# Patient Record
Sex: Female | Born: 1980 | Race: White | Hispanic: No | Marital: Married | Smoking: Former smoker
Health system: Southern US, Community
[De-identification: ages and names within clinical notes are randomized; demographics above are authoritative.]

## PROBLEM LIST (undated history)

## (undated) DIAGNOSIS — F449 Dissociative and conversion disorder, unspecified: Secondary | ICD-10-CM

## (undated) DIAGNOSIS — G43909 Migraine, unspecified, not intractable, without status migrainosus: Secondary | ICD-10-CM

## (undated) DIAGNOSIS — F319 Bipolar disorder, unspecified: Secondary | ICD-10-CM

## (undated) DIAGNOSIS — F32A Depression, unspecified: Secondary | ICD-10-CM

## (undated) DIAGNOSIS — E559 Vitamin D deficiency, unspecified: Secondary | ICD-10-CM

## (undated) DIAGNOSIS — N83209 Unspecified ovarian cyst, unspecified side: Secondary | ICD-10-CM

## (undated) DIAGNOSIS — D649 Anemia, unspecified: Secondary | ICD-10-CM

## (undated) DIAGNOSIS — Z87442 Personal history of urinary calculi: Secondary | ICD-10-CM

## (undated) DIAGNOSIS — G4730 Sleep apnea, unspecified: Secondary | ICD-10-CM

## (undated) DIAGNOSIS — A4902 Methicillin resistant Staphylococcus aureus infection, unspecified site: Secondary | ICD-10-CM

## (undated) DIAGNOSIS — I10 Essential (primary) hypertension: Secondary | ICD-10-CM

## (undated) DIAGNOSIS — T7840XA Allergy, unspecified, initial encounter: Secondary | ICD-10-CM

## (undated) DIAGNOSIS — G4700 Insomnia, unspecified: Secondary | ICD-10-CM

## (undated) DIAGNOSIS — E538 Deficiency of other specified B group vitamins: Secondary | ICD-10-CM

## (undated) DIAGNOSIS — J45909 Unspecified asthma, uncomplicated: Secondary | ICD-10-CM

## (undated) HISTORY — PX: NASAL SINUS SURGERY: SHX719

## (undated) HISTORY — DX: Sleep apnea, unspecified: G47.30

## (undated) HISTORY — DX: Allergy, unspecified, initial encounter: T78.40XA

## (undated) HISTORY — PX: TOTAL ABDOMINAL HYSTERECTOMY W/ BILATERAL SALPINGOOPHORECTOMY: SHX83

## (undated) HISTORY — DX: Vitamin D deficiency, unspecified: E55.9

## (undated) HISTORY — PX: ABDOMINAL HYSTERECTOMY: SHX81

## (undated) HISTORY — PX: BUNIONECTOMY: SHX129

## (undated) HISTORY — PX: HAMMER TOE SURGERY: SHX385

## (undated) HISTORY — DX: Bipolar disorder, unspecified: F31.9

## (undated) HISTORY — DX: Unspecified ovarian cyst, unspecified side: N83.209

## (undated) HISTORY — DX: Dissociative and conversion disorder, unspecified: F44.9

## (undated) HISTORY — DX: Migraine, unspecified, not intractable, without status migrainosus: G43.909

## (undated) HISTORY — PX: EYE SURGERY: SHX253

## (undated) HISTORY — DX: Unspecified asthma, uncomplicated: J45.909

## (undated) HISTORY — DX: Insomnia, unspecified: G47.00

## (undated) HISTORY — DX: Depression, unspecified: F32.A

## (undated) HISTORY — DX: Deficiency of other specified B group vitamins: E53.8

---

## 2005-09-24 HISTORY — PX: TUBAL LIGATION: SHX77

## 2008-09-24 DIAGNOSIS — A4902 Methicillin resistant Staphylococcus aureus infection, unspecified site: Secondary | ICD-10-CM

## 2008-09-24 HISTORY — DX: Methicillin resistant Staphylococcus aureus infection, unspecified site: A49.02

## 2012-07-24 ENCOUNTER — Ambulatory Visit: Payer: Self-pay | Admitting: Family Medicine

## 2012-08-29 ENCOUNTER — Ambulatory Visit: Payer: Self-pay | Admitting: Family Medicine

## 2012-09-24 HISTORY — PX: HAND SURGERY: SHX662

## 2012-12-02 ENCOUNTER — Ambulatory Visit: Payer: Self-pay | Admitting: Neurology

## 2013-07-16 ENCOUNTER — Ambulatory Visit: Payer: Self-pay | Admitting: Specialist

## 2013-07-17 LAB — PATHOLOGY REPORT

## 2013-07-27 DIAGNOSIS — F446 Conversion disorder with sensory symptom or deficit: Secondary | ICD-10-CM | POA: Insufficient documentation

## 2013-12-17 ENCOUNTER — Ambulatory Visit: Payer: Self-pay | Admitting: Family Medicine

## 2015-01-14 NOTE — Op Note (Signed)
PATIENT NAME:  Emily [name], Emily [name] MR#:  [id number] DATE OF BIRTH:  [birth year]  DATE OF PROCEDURE:  07/16/2013  PREOPERATIVE DIAGNOSIS: Dorsal left wrist ganglion.   POSTOPERATIVE DIAGNOSIS: Dorsal left wrist ganglion.   PROCEDURE: Excision of dorsal left wrist ganglion.   SURGEON: Myra Rudehristopher Vashon Riordan, M.D.   ANESTHESIA: General.   COMPLICATIONS: None.   TOURNIQUET TIME: Approximately 30 minutes.   DESCRIPTION OF PROCEDURE: After adequate induction of general anesthesia, the left upper extremity is thoroughly prepped with alcohol and ChloraPrep and draped in standard sterile fashion. The extremity is wrapped out with the Esmarch bandage and pneumatic tourniquet elevated to 250 mmHg. Under loupe magnification, standard transverse incision is made on the dorsum of the wrist over the prominence of the ganglion. The dissection is carried down to the extensor tendons and the tendons are reflected to each side. The small fascia over the ganglion is incised. The ganglion is seen to be 1.5 cm and extends down to the usual position at the wrist joint. The ganglion is completely dissected out along with a 1 cm in diameter area of wrist capsule. Careful search is made for any residual ganglion and none is seen. The wound is thoroughly irrigated multiple times. Skin edges are infiltrated with 0.5% plain Marcaine. Subcutaneous tissue is closed with 5-0 Vicryl. The skin is closed with a running subcuticular 3-0 Prolene. A soft bulky dressing with a volar splint is applied. The patient is returned to the recovery room in satisfactory condition having tolerated the procedure quite well. ____________________________ Clare Gandyhristopher E. Read Bonelli, MD ces:sb D: 07/16/2013 08:43:13 ET T: 07/16/2013 08:59:24 ET JOB#: [id number]  cc: Clare Gandyhristopher E. Cardell Rachel, MD, <Dictator> Clare GandyHRISTOPHER E Charliee Krenz MD ELECTRONICALLY SIGNED 07/17/2013 6:15

## 2015-04-14 DIAGNOSIS — F339 Major depressive disorder, recurrent, unspecified: Secondary | ICD-10-CM

## 2015-04-14 DIAGNOSIS — E559 Vitamin D deficiency, unspecified: Secondary | ICD-10-CM

## 2015-04-14 DIAGNOSIS — G4700 Insomnia, unspecified: Secondary | ICD-10-CM

## 2015-04-14 DIAGNOSIS — E538 Deficiency of other specified B group vitamins: Secondary | ICD-10-CM

## 2015-04-14 DIAGNOSIS — N920 Excessive and frequent menstruation with regular cycle: Secondary | ICD-10-CM | POA: Insufficient documentation

## 2015-04-14 DIAGNOSIS — G4730 Sleep apnea, unspecified: Secondary | ICD-10-CM

## 2015-04-14 DIAGNOSIS — G43909 Migraine, unspecified, not intractable, without status migrainosus: Secondary | ICD-10-CM

## 2015-04-14 DIAGNOSIS — F319 Bipolar disorder, unspecified: Secondary | ICD-10-CM

## 2015-04-14 DIAGNOSIS — G4733 Obstructive sleep apnea (adult) (pediatric): Secondary | ICD-10-CM | POA: Insufficient documentation

## 2015-04-14 DIAGNOSIS — F449 Dissociative and conversion disorder, unspecified: Secondary | ICD-10-CM | POA: Insufficient documentation

## 2015-04-14 DIAGNOSIS — N924 Excessive bleeding in the premenopausal period: Secondary | ICD-10-CM

## 2015-04-14 DIAGNOSIS — J45909 Unspecified asthma, uncomplicated: Secondary | ICD-10-CM

## 2015-04-15 ENCOUNTER — Encounter: Payer: Self-pay | Admitting: Unknown Physician Specialty

## 2015-04-15 ENCOUNTER — Ambulatory Visit (INDEPENDENT_AMBULATORY_CARE_PROVIDER_SITE_OTHER): Payer: Managed Care, Other (non HMO) | Admitting: Unknown Physician Specialty

## 2015-04-15 VITALS — BP 118/82 | HR 87 | Temp 98.5°F | Ht 67.5 in | Wt 251.6 lb

## 2015-04-15 DIAGNOSIS — N730 Acute parametritis and pelvic cellulitis: Secondary | ICD-10-CM

## 2015-04-15 DIAGNOSIS — R1032 Left lower quadrant pain: Secondary | ICD-10-CM

## 2015-04-15 MED ORDER — METRONIDAZOLE 500 MG PO TABS: 500.0000 mg | ORAL_TABLET | Freq: Three times a day (TID) | ORAL | Status: DC

## 2015-04-15 MED ORDER — CIPROFLOXACIN HCL 750 MG PO TABS: 750.0000 mg | ORAL_TABLET | Freq: Two times a day (BID) | ORAL | Status: DC

## 2015-04-15 MED ORDER — HYDROCODONE-ACETAMINOPHEN 5-325 MG PO TABS: 1.0000 | ORAL_TABLET | Freq: Four times a day (QID) | ORAL | Status: DC | PRN

## 2015-04-15 NOTE — Patient Instructions (Signed)
Pelvic Inflammatory Disease °Pelvic inflammatory disease (PID) refers to an infection in some or all of the female organs. The infection can be in the uterus, ovaries, fallopian tubes, or the surrounding tissues in the pelvis. PID can cause abdominal or pelvic pain that comes on suddenly (acute pelvic pain). PID is a serious infection because it can lead to lasting (chronic) pelvic pain or the inability to have children (infertile).  °CAUSES  °The infection is often caused by the normal bacteria found in the vaginal tissues. PID may also be caused by an infection that is spread during sexual contact. PID can also occur following:  °· The birth of a baby.   °· A miscarriage.   °· An abortion.   °· Major pelvic surgery.   °· The use of an intrauterine device (IUD).   °· A sexual assault.   °RISK FACTORS °Certain factors can put a person at higher risk for PID, such as: °· Being younger than 25 years. °· Being sexually active at a young age. °· Using nonbarrier contraception. °· Having multiple sexual partners. °· Having sex with someone who has symptoms of a genital infection. °· Using oral contraception. °Other times, certain behaviors can increase the possibility of getting PID, such as: °· Having sex during your period. °· Using a vaginal douche. °· Having an intrauterine device (IUD) in place. °SYMPTOMS  °· Abdominal or pelvic pain.   °· Fever.   °· Chills.   °· Abnormal vaginal discharge. °· Abnormal uterine bleeding.   °· Unusual pain shortly after finishing your period. °DIAGNOSIS  °Your caregiver will choose some of the following methods to make a diagnosis, such as:  °· Performing a physical exam and history. A pelvic exam typically reveals a very tender uterus and surrounding pelvis.   °· Ordering laboratory tests including a pregnancy test, blood tests, and urine test.  °· Ordering cultures of the vagina and cervix to check for a sexually transmitted infection (STI). °· Performing an ultrasound.    °· Performing a laparoscopic procedure to look inside the pelvis.   °TREATMENT  °· Antibiotic medicines may be prescribed and taken by mouth.   °· Sexual partners may be treated when the infection is caused by a sexually transmitted disease (STD).   °· Hospitalization may be needed to give antibiotics intravenously. °· Surgery may be needed, but this is rare. °It may take weeks until you are completely well. If you are diagnosed with PID, you should also be checked for human immunodeficiency virus (HIV).   °HOME CARE INSTRUCTIONS  °· If given, take your antibiotics as directed. Finish the medicine even if you start to feel better.   °· Only take over-the-counter or prescription medicines for pain, discomfort, or fever as directed by your caregiver.   °· Do not have sexual intercourse until treatment is completed or as directed by your caregiver. If PID is confirmed, your recent sexual partner(s) will need treatment.   °· Keep your follow-up appointments. °SEEK MEDICAL CARE IF:  °· You have increased or abnormal vaginal discharge.   °· You need prescription medicine for your pain.   °· You vomit.   °· You cannot take your medicines.   °· Your partner has an STD.   °SEEK IMMEDIATE MEDICAL CARE IF:  °· You have a fever.   °· You have increased abdominal or pelvic pain.   °· You have chills.   °· You have pain when you urinate.   °· You are not better after 72 hours following treatment.   °MAKE SURE YOU:  °· Understand these instructions. °· Will watch your condition. °· Will get help right away if you are not doing well or get worse. °  Document Released: 09/10/2005 Document Revised: 01/05/2013 Document Reviewed: 09/06/2011 °ExitCare® Patient Information ©2015 [employer]. This information is not intended to replace advice given to you by your health care provider. Make sure you discuss any questions you have with your health care provider. ° °

## 2015-04-15 NOTE — Progress Notes (Signed)
BP 118/82 mmHg  Pulse 87  Temp(Src) 98.5 F (36.9 C)  Ht 5' 7.5" (1.715 m)  Wt 251 lb 9.6 oz (114.125 kg)  BMI 38.80 kg/m2  SpO2 100%  LMP 03/26/2015 (Exact Date)   Subjective:    Patient ID: Emily [name], female    DOB: 06-Dec-1980, 34 y.o.   MRN: [id number]  HPI: Emily [name] is a 34 y.o. female  Chief Complaint  Patient presents with  . Abdominal Pain    pt states pain is in lower, left quadrant. Pain started Sunday night (04/10/15)   Abdominal Pain This is a new problem. Episode onset: 5 days. The onset quality is sudden. The problem occurs constantly. The problem has been unchanged. The pain is located in the LLQ. Pain scale: 5/10 at rest but much higher at times. Quality: stabbing. The abdominal pain does not radiate. Associated symptoms include nausea. Pertinent negatives include no constipation, diarrhea, dysuria, frequency or vomiting. The pain is aggravated by movement. The pain is relieved by nothing. Treatments tried: Ibuprofen. The treatment provided no relief.  LMP 7/02  Relevant past medical, surgical, family and social history reviewed and updated as indicated. Interim medical history since our last visit reviewed. Allergies and medications reviewed and updated.  Review of Systems  Gastrointestinal: Positive for nausea and abdominal pain. Negative for vomiting, diarrhea and constipation.  Genitourinary: Negative for dysuria and frequency.    Per HPI unless specifically indicated above     Objective:    BP 118/82 mmHg  Pulse 87  Temp(Src) 98.5 F (36.9 C)  Ht 5' 7.5" (1.715 m)  Wt 251 lb 9.6 oz (114.125 kg)  BMI 38.80 kg/m2  SpO2 100%  LMP 03/26/2015 (Exact Date)  Wt Readings from Last 3 Encounters:  04/15/15 251 lb 9.6 oz (114.125 kg)  07/26/14 234 lb (106.142 kg)    Physical Exam  Constitutional: She is oriented to person, place, and time. She appears well-developed and well-nourished. No distress.  HENT:  Head: Normocephalic and atraumatic.   Eyes: Conjunctivae and lids are normal. Right eye exhibits no discharge. Left eye exhibits no discharge. No scleral icterus.  Cardiovascular: Normal rate and regular rhythm.   Pulmonary/Chest: Effort normal. No respiratory distress.  Abdominal: Normal appearance. There is no splenomegaly or hepatomegaly. There is tenderness in the left lower quadrant. There is guarding. There is no CVA tenderness.  Genitourinary: Vagina normal and uterus normal. Cervix exhibits discharge and friability. Cervix exhibits no motion tenderness. Right adnexum displays tenderness. Left adnexum displays tenderness.  Musculoskeletal: Normal range of motion.  Neurological: She is alert and oriented to person, place, and time.  Skin: Skin is intact. No rash noted. No pallor.  Psychiatric: She has a normal mood and affect. Her behavior is normal. Judgment and thought content normal.   WBC is 12.2 Urine is negative.   Wet prep shows positive clue cells    Assessment & Plan:   Problem List Items Addressed This Visit    None    Visit Diagnoses    Left lower quadrant pain    -  Primary    Relevant Orders    UA/M w/rflx Culture, Routine    CBC With Differential/Platelet    Pregnancy, urine    WET PREP FOR TRICH, YEAST, CLUE    GC/Chlamydia Probe Amp    PID (acute pelvic inflammatory disease)        Will rx Cipro and Flagyl.  Will rx Hydrocodone for severe pain. Check GC and Chlamydia.  Pt ed on possible causes of infection.      Relevant Medications    metroNIDAZOLE (FLAGYL) 500 MG tablet        Follow up plan: Return if symptoms worsen or fail to improve.  To the ER worsening symptoms or unable to keep down food or fluids.

## 2015-04-16 LAB — WET PREP FOR TRICH, YEAST, CLUE
Clue Cell Exam: POSITIVE — AB
Trichomonas Exam: NEGATIVE
Yeast Exam: NEGATIVE

## 2015-04-17 LAB — GC/CHLAMYDIA PROBE AMP
Chlamydia trachomatis, NAA: NEGATIVE
Neisseria gonorrhoeae by PCR: NEGATIVE

## 2015-04-19 ENCOUNTER — Telehealth: Payer: Self-pay

## 2015-04-19 ENCOUNTER — Ambulatory Visit (INDEPENDENT_AMBULATORY_CARE_PROVIDER_SITE_OTHER): Payer: Managed Care, Other (non HMO) | Admitting: Family Medicine

## 2015-04-19 ENCOUNTER — Encounter: Payer: Self-pay | Admitting: Family Medicine

## 2015-04-19 VITALS — BP 111/76 | HR 79 | Temp 99.2°F | Wt 252.2 lb

## 2015-04-19 DIAGNOSIS — R1031 Right lower quadrant pain: Secondary | ICD-10-CM

## 2015-04-19 DIAGNOSIS — R1032 Left lower quadrant pain: Secondary | ICD-10-CM

## 2015-04-19 MED ORDER — ONDANSETRON HCL 4 MG PO TABS: 4.0000 mg | ORAL_TABLET | Freq: Three times a day (TID) | ORAL | Status: DC | PRN

## 2015-04-19 NOTE — Telephone Encounter (Signed)
Pt called is still experiencing severe pain, pt added to Dr. Ronnette Hila schedule for today @ 1pm. Thanks.

## 2015-04-19 NOTE — Patient Instructions (Signed)
CT Scan A computed tomography (CT) scan is a specialized X-ray scan. It uses X-rays and a computer to make pictures of different areas of your body. A CT scan can offer more detailed information than a regular X-ray exam. The CT scan provides data about internal organs, soft tissue structures, blood vessels, and bones.  The CT scanner is a large machine that takes pictures of your body as you move through the opening.  LET YOUR HEALTH CARE PROVIDER KNOW ABOUT:  Any allergies you have.   All medicines you are taking, including vitamins, herbs, eye drops, creams, and over-the-counter medicines.   Previous problems you or members of your family have had with the use of anesthetics.   Any blood disorders you have.   Previous surgeries you have had.   Medical conditions you have. RISKS AND COMPLICATIONS  Generally, this is a safe procedure. However, as with any procedure, problems can occur. Possible problems include:   An allergic reaction to the contrast material.   Development of cancer from excessive exposure to radiation. The risk of this is small.  BEFORE THE PROCEDURE   The day before the test, stop drinking caffeinated beverages. These include energy drinks, tea, soda, coffee, and hot chocolate.   On the day of the test:  About 4 hours before the test, stop eating and drinking anything but water as advised by your health care provider.   Avoid wearing jewelry. You will have to partly or fully undress and wear a hospital gown. PROCEDURE   You will be asked to lie on a table with your arms above your head.   If contrast dye is to be used for the test, an IV tube will be inserted in your arm. The contrast dye will be injected into the IV tube. You might feel warm, or you may get a metallic taste in your mouth.   The table you will be lying on will move into a large machine that will do the scanning.   You will be able to see, hear, and talk to the person running the  machine while you are in it. Follow that person's directions.   The CT machine will move around you to take pictures. Do not move while it is scanning. This helps to get a good image.   When the best possible pictures have been taken, the machine will be turned off. The table will be moved out of the machine. The IV tube will then be removed. AFTER THE PROCEDURE  Ask your health care provider when to follow up for your test results. Document Released: 10/18/2004 Document Revised: 09/15/2013 Document Reviewed: 05/18/2013 ExitCare Patient Information 2015 [employer]. This information is not intended to replace advice given to you by your health care provider. Make sure you discuss any questions you have with your health care provider.  

## 2015-04-19 NOTE — Progress Notes (Signed)
BP 111/76 mmHg  Pulse 79  Temp(Src) 99.2 F (37.3 C)  Wt 252 lb 3.2 oz (114.397 kg)  SpO2 99%  LMP 03/26/2015 (Exact Date)   Subjective:    Patient ID: Emily [name], female    DOB: [birth year], 34 y.o.   MRN: [id number]  HPI: Emily [name] is a 34 y.o. female  Chief Complaint  Patient presents with  . Abdominal Pain    patient was recently seen by CW, she gave her Flagyl and Cipro. Patients states that she is not better at all, and that the pain has traveled to the other side of her abdomen.   ABDOMINAL PAIN- she notes that her pain has gotten worse, now on the R and and the L side. Pain medicine makes her feel nauseous and dizzy, so didn't take it past Sunday. Not better- worse. Concerned so came back in. Negative pregnancy on 04/15/15. Has not had any sexual activity since prior to then.  Duration: 9 days Onset: sudden Severity: 7/10- constant on the L, 4/10 on the R Quality: sharp and stabbing Location:  LLQ and RLQ  Episode duration:  Radiation: no Frequency: constant Alleviating factors: nothing Aggravating factors: twisting, movement Status: worse Treatments attempted: antibiotics Fever: yes Nausea: yes Vomiting: no Weight loss: no Decreased appetite: no Diarrhea: yes Constipation: no Blood in stool: no Heartburn: no Jaundice: no Rash: no Dysuria/urinary frequency: no Hematuria: no History of sexually transmitted disease: no Recurrent NSAID use: no   Relevant past medical, surgical, family and social history reviewed and updated as indicated. Interim medical history since our last visit reviewed. Allergies and medications reviewed and updated.  Review of Systems  Constitutional: Negative.   Respiratory: Negative.   Cardiovascular: Negative.   Gastrointestinal: Positive for nausea, abdominal pain and diarrhea. Negative for vomiting, constipation, blood in stool, abdominal distention, anal bleeding and rectal pain.  Psychiatric/Behavioral: Negative.      Per HPI unless specifically indicated above     Objective:    BP 111/76 mmHg  Pulse 79  Temp(Src) 99.2 F (37.3 C)  Wt 252 lb 3.2 oz (114.397 kg)  SpO2 99%  LMP 03/26/2015 (Exact Date)  Wt Readings from Last 3 Encounters:  04/19/15 252 lb 3.2 oz (114.397 kg)  04/15/15 251 lb 9.6 oz (114.125 kg)  07/26/14 234 lb (106.142 kg)    Physical Exam  Constitutional: She is oriented to person, place, and time. She appears well-developed and well-nourished. No distress.  HENT:  Head: Normocephalic and atraumatic.  Right Ear: Hearing normal.  Left Ear: Hearing normal.  Nose: Nose normal.  Eyes: Conjunctivae and lids are normal. Right eye exhibits no discharge. Left eye exhibits no discharge. No scleral icterus.  Cardiovascular: Normal rate, regular rhythm, normal heart sounds and intact distal pulses.  Exam reveals no gallop and no friction rub.   No murmur heard. Pulmonary/Chest: Effort normal and breath sounds normal. No respiratory distress. She has no wheezes. She has no rales. She exhibits no tenderness.  Abdominal: Soft. Bowel sounds are normal. She exhibits no distension and no mass. There is no hepatosplenomegaly. There is tenderness in the right lower quadrant, left upper quadrant and left lower quadrant. There is guarding. There is no rigidity, no rebound, no CVA tenderness, no tenderness at McBurney's point and negative Murphy's sign.    Musculoskeletal: Normal range of motion.  Neurological: She is alert and oriented to person, place, and time.  Skin: Skin is warm, dry and intact. No rash noted. No erythema. No  pallor.  Psychiatric: She has a normal mood and affect. Her speech is normal and behavior is normal. Judgment and thought content normal. Cognition and memory are normal.  Nursing note and vitals reviewed.   Results for orders placed or performed in visit on 04/15/15  WET PREP FOR TRICH, YEAST, CLUE  Result Value Ref Range   Trichomonas Exam Negative Negative    Yeast Exam Negative Negative   Clue Cell Exam Positive (A) Negative  GC/Chlamydia Probe Amp  Result Value Ref Range   Chlamydia trachomatis, NAA Negative Negative   Neisseria gonorrhoeae by PCR Negative Negative   PLEASE NOTE: Comment   Microscopic Examination  Result Value Ref Range   WBC, UA 0-5 0 -  5 /hpf   RBC, UA 0-2 0 -  2 /hpf   Epithelial Cells (non renal) 0-10 0 - 10 /hpf   Bacteria, UA Few None seen/Few  UA/M w/rflx Culture, Routine  Result Value Ref Range   Urinalysis Reflex Comment   CBC With Differential/Platelet  Result Value Ref Range   WBC 12.8 (H) 3.4 - 10.8 x10E3/uL   RBC 4.56 3.77 - 5.28 x10E6/uL   Hemoglobin 13.2 11.1 - 15.9 g/dL   Hematocrit 16.1 09.6 - 46.6 %   MCV 86 79 - 97 fL   MCH 28.9 26.6 - 33.0 pg   MCHC 33.6 31.5 - 35.7 g/dL   RDW 04.5 40.9 - 81.1 %   Platelets 321 150 - 379 x10E3/uL   Neutrophils 61 %   Lymphs 29 %   MID 10 %   Neutrophils Absolute 7.8 (H) 1.4 - 7.0 x10E3/uL   Lymphocytes Absolute 3.7 (H) 0.7 - 3.1 x10E3/uL   MID (Absolute) 1.3 0.1 - 1.6 X10E3/uL  Pregnancy, urine  Result Value Ref Range   Preg Test, Ur Negative Negative  Urine Culture, Routine  Result Value Ref Range   Urine Culture, Routine Final report    Result 1 Comment       Assessment & Plan:   Problem List Items Addressed This Visit    None    Visit Diagnoses    Bilateral lower abdominal pain    -  Primary    Concerning for appendicitis, but negative WBC last visit. Will obtain CT of abdomen and pelvis with contrast. Continue abx. CMP checked today. Await results.     Relevant Orders    CT Abdomen Pelvis W Contrast    Comprehensive metabolic panel        Follow up plan: Return Pending results. Marland Kitchen

## 2015-04-20 ENCOUNTER — Encounter: Payer: Self-pay | Admitting: Family Medicine

## 2015-04-20 LAB — CBC WITH DIFFERENTIAL/PLATELET
HEMATOCRIT: 39.3 % (ref 34.0–46.6)
HEMOGLOBIN: 13.2 g/dL (ref 11.1–15.9)
Lymphocytes Absolute: 3.7 10*3/uL — ABNORMAL HIGH (ref 0.7–3.1)
Lymphs: 29 %
MCH: 28.9 pg (ref 26.6–33.0)
MCHC: 33.6 g/dL (ref 31.5–35.7)
MCV: 86 fL (ref 79–97)
MID (Absolute): 1.3 10*3/uL (ref 0.1–1.6)
MID: 10 %
Neutrophils Absolute: 7.8 10*3/uL — ABNORMAL HIGH (ref 1.4–7.0)
Neutrophils: 61 %
Platelets: 321 10*3/uL (ref 150–379)
RBC: 4.56 x10E6/uL (ref 3.77–5.28)
RDW: 13.4 % (ref 12.3–15.4)
WBC: 12.8 10*3/uL — AB (ref 3.4–10.8)

## 2015-04-20 LAB — UA/M W/RFLX CULTURE, ROUTINE
BILIRUBIN UA: NEGATIVE
Glucose, UA: NEGATIVE
KETONES UA: NEGATIVE
NITRITE UA: NEGATIVE
PROTEIN UA: NEGATIVE
RBC UA: NEGATIVE
SPEC GRAV UA: 1.015 (ref 1.005–1.030)
Urobilinogen, Ur: 0.2 mg/dL (ref 0.2–1.0)
pH, UA: 6 (ref 5.0–7.5)

## 2015-04-20 LAB — PREGNANCY, URINE: Preg Test, Ur: NEGATIVE

## 2015-04-20 LAB — COMPREHENSIVE METABOLIC PANEL
ALT: 15 IU/L (ref 0–32)
AST: 14 IU/L (ref 0–40)
Albumin/Globulin Ratio: 1.5 (ref 1.1–2.5)
Albumin: 4.2 g/dL (ref 3.5–5.5)
Alkaline Phosphatase: 82 IU/L (ref 39–117)
BUN / CREAT RATIO: 9 (ref 8–20)
BUN: 9 mg/dL (ref 6–20)
Bilirubin Total: 0.5 mg/dL (ref 0.0–1.2)
CHLORIDE: 102 mmol/L (ref 97–108)
CO2: 24 mmol/L (ref 18–29)
Calcium: 9.1 mg/dL (ref 8.7–10.2)
Creatinine, Ser: 0.97 mg/dL (ref 0.57–1.00)
GFR calc non Af Amer: 76 mL/min/{1.73_m2} (ref >59)
GFR, EST AFRICAN AMERICAN: 88 mL/min/{1.73_m2} (ref >59)
GLUCOSE: 101 mg/dL — AB (ref 65–99)
Globulin, Total: 2.8 g/dL (ref 1.5–4.5)
Potassium: 4.6 mmol/L (ref 3.5–5.2)
Sodium: 140 mmol/L (ref 134–144)
Total Protein: 7 g/dL (ref 6.0–8.5)

## 2015-04-20 LAB — MICROSCOPIC EXAMINATION

## 2015-04-20 LAB — URINE CULTURE, REFLEX

## 2015-04-21 ENCOUNTER — Ambulatory Visit
Admission: RE | Admit: 2015-04-21 | Discharge: 2015-04-21 | Disposition: A | Discharge status: Home / Self Care | Payer: Managed Care, Other (non HMO) | Source: Ambulatory Visit | Attending: Family Medicine | Admitting: Family Medicine

## 2015-04-21 ENCOUNTER — Telehealth: Payer: Self-pay | Admitting: Family Medicine

## 2015-04-21 DIAGNOSIS — R1032 Left lower quadrant pain: Secondary | ICD-10-CM | POA: Diagnosis present

## 2015-04-21 DIAGNOSIS — N83202 Unspecified ovarian cyst, left side: Secondary | ICD-10-CM

## 2015-04-21 DIAGNOSIS — R1031 Right lower quadrant pain: Secondary | ICD-10-CM

## 2015-04-21 DIAGNOSIS — N830 Follicular cyst of ovary: Secondary | ICD-10-CM | POA: Diagnosis not present

## 2015-04-21 MED ORDER — IOHEXOL 350 MG/ML SOLN
100.0000 mL | Freq: Once | INTRAVENOUS | Status: AC | PRN
  Administered 2015-04-21: 100 mL via INTRAVENOUS

## 2015-04-21 MED ORDER — IOHEXOL 300 MG/ML  SOLN: 100.0000 mL | Freq: Once | INTRAMUSCULAR | Status: DC | PRN

## 2015-04-21 MED ORDER — TRAMADOL HCL 50 MG PO TABS: 50.0000 mg | ORAL_TABLET | Freq: Three times a day (TID) | ORAL | Status: DC | PRN

## 2015-04-21 NOTE — Telephone Encounter (Signed)
CT of the abdomen showed L sided ovarian cyst. Patient cannot take her vicodin as it makes her feel strange. Will start her on tramadol for pain. OK to stop antibiotics as she does not appear to have a reason to be taking them. She requests to see GYN. Referral to GYN made today.

## 2015-04-22 ENCOUNTER — Other Ambulatory Visit: Payer: Self-pay | Admitting: Family Medicine

## 2015-04-22 MED ORDER — TRAMADOL HCL 50 MG PO TABS: 50.0000 mg | ORAL_TABLET | Freq: Three times a day (TID) | ORAL | Status: DC | PRN

## 2015-05-03 ENCOUNTER — Encounter: Payer: Self-pay | Admitting: Obstetrics and Gynecology

## 2015-05-03 ENCOUNTER — Ambulatory Visit (INDEPENDENT_AMBULATORY_CARE_PROVIDER_SITE_OTHER): Payer: Managed Care, Other (non HMO) | Admitting: Obstetrics and Gynecology

## 2015-05-03 VITALS — BP 123/79 | HR 102 | Ht 69.0 in | Wt 250.6 lb

## 2015-05-03 DIAGNOSIS — R102 Pelvic and perineal pain: Secondary | ICD-10-CM

## 2015-05-03 DIAGNOSIS — N8320 Unspecified ovarian cysts: Secondary | ICD-10-CM

## 2015-05-03 DIAGNOSIS — N946 Dysmenorrhea, unspecified: Secondary | ICD-10-CM

## 2015-05-03 DIAGNOSIS — Z9851 Tubal ligation status: Secondary | ICD-10-CM

## 2015-05-03 DIAGNOSIS — N921 Excessive and frequent menstruation with irregular cycle: Secondary | ICD-10-CM

## 2015-05-03 DIAGNOSIS — N83202 Unspecified ovarian cyst, left side: Secondary | ICD-10-CM

## 2015-05-03 MED ORDER — HYDROCODONE-ACETAMINOPHEN 5-325 MG PO TABS: 1.0000 | ORAL_TABLET | Freq: Four times a day (QID) | ORAL | Status: DC | PRN

## 2015-05-03 MED ORDER — ONDANSETRON 4 MG PO TBDP: 4.0000 mg | ORAL_TABLET | Freq: Four times a day (QID) | ORAL | Status: DC | PRN

## 2015-05-03 NOTE — Progress Notes (Signed)
Patient ID: Emily [name], female   DOB: [birth year], 34 y.o.   MRN: [id number]   Refer from [facility] family Pelvic pain worseing  x 1 month very painful during period x 6 months  ovarian cyst left- 2.5cm  see ct abd and pelvis- 04/21/2015

## 2015-05-03 NOTE — Progress Notes (Deleted)
GYNECOLOGY PROGRESS NOTE  Subjective:    Patient ID: Emily [name], female    DOB: [birth year], 34 y.o.   MRN: [id number]  HPI  Patient is a 34 y.o. [id number] female who presents for   {Common ambulatory SmartLinks:19316}  Review of Systems {ros; complete:30496}   Objective:   Blood pressure 123/79, pulse 102, height  (1.753 m), weight 250 lb 9.6 oz (113.671 kg), last menstrual period 04/23/2015. General appearance: {general exam:16600} Abdomen: {abdominal exam:16834} Pelvic: {pelvic exam:16852::"cervix normal in appearance","external genitalia normal","no adnexal masses or tenderness","no cervical motion tenderness","rectovaginal septum normal","uterus normal size, shape, and consistency","vagina normal without discharge"} Extremities: {extremity exam:5109} Neurologic: {neuro exam:17854}   Assessment:    Plan:

## 2015-05-04 MED ORDER — NORETHIN ACE-ETH ESTRAD-FE 1-20 MG-MCG(24) PO CHEW: 1.0000 | CHEWABLE_TABLET | Freq: Every day | ORAL | Status: DC

## 2015-05-04 NOTE — H&P (Signed)
Subjective:    Patient is a 34 y.o. G8P3003 female scheduled for laparoscopic BTL and Mirena IUD placement. Indications for procedure are dislodged left sterilization clip, and heavy menstrual bleeding with worsening dysmenorrhea.  Patient with h/o irregular cycles since onset of menses, however notes that menses has now become heavier and associated with painful cramps over the past 6 months.  Not relieved by Ibuprofen.  Has used Tramadol for cramps but notes undesirable side effects (moderate nausea, drowsiness).  Was seen by PCP recently due to persistent left sided abdominal pain outside of menses. Was told that she had an ovarian cyst and was referred to GYN.    Pertinent Gynecological History: Menses: irregular occurring approximately every 21-24 days with spotting approximately 2 days per month, followed by 7-10 days of heavy bleeding. Bleeding: dysfunctional uterine bleeding Contraception: tubal ligation Last mammogram: not yet indicated. Last pap: normal Date: 2014  Discussed Blood/Blood Products: no   Menstrual History: OB History    Gravida Para Term Preterm AB TAB SAB Ectopic Multiple Living   3 3 3       3       Menarche age: 58  Patient's last menstrual period was 04/23/2015.     OB History  Gravida Para Term Preterm AB SAB TAB Ectopic Multiple Living  3 3 3       3     # Outcome Date GA Lbr Len/2nd Weight Sex Delivery Anes PTL Lv  3 Term 2007   8 lb 1.6 oz (3.674 kg) M Vag-Spont   Y  2 Term 2004   8 lb 4.8 oz (3.765 kg) F Vag-Spont   Y  1 Term 2002   8 lb 1.8 oz (3.679 kg) F Vag-Spont   Y      Past Medical History  Diagnosis Date  . Conversion disorder   . Insomnia   . Asthma   . Bipolar 1 disorder   . Vitamin B 12 deficiency   . Vitamin D deficiency   . Depression   . Migraines   . Sleep apnea   . Ovarian cyst     left 2.5cm    Past Surgical History  Procedure Laterality Date  . Bunionectomy    . Hammer toe surgery    . Nasal sinus surgery    .  Hand surgery  2014    cyst removed  . Tubal ligation  2007    Social History   Social History  . Marital Status: Married    Spouse Name: N/A  . Number of Children: N/A  . Years of Education: N/A   Social History Main Topics  . Smoking status: Former Smoker    Quit date: 09/25/2003  . Smokeless tobacco: Never Used  . Alcohol Use: 0.0 oz/week    0 Standard drinks or equivalent per week     Comment: pt states she is a social drinker  . Drug Use: No  . Sexual Activity: Yes    Birth Control/ Protection: Surgical   Other Topics Concern  . None   Social History Narrative    Family History  Problem Relation Age of Onset  . Bipolar disorder Mother   . Anxiety disorder Mother   . Cancer Mother     brain  . Glaucoma Mother   . Heart disease Father   . Diabetes Father   . Cancer Father   . Bipolar disorder Sister   . Schizophrenia Sister   . Cancer Maternal Grandmother     lung  .  Heart disease Maternal Grandfather   . Breast cancer Brother   . Ovarian cancer Neg Hx     Outpatient Encounter Prescriptions as of 05/03/2015  Medication Sig  . amitriptyline (ELAVIL) 10 MG tablet Take 10 mg by mouth at bedtime.  Marland Kitchen buPROPion (WELLBUTRIN XL) 300 MG 24 hr tablet Take 300 mg by mouth daily.  . fluticasone (FLONASE) 50 MCG/ACT nasal spray Place 2 sprays into both nostrils daily as needed for allergies or rhinitis.  . Multiple Vitamin (MULTIVITAMIN) tablet Take 1 tablet by mouth daily.  . ondansetron (ZOFRAN) 4 MG tablet Take 1 tablet (4 mg total) by mouth every 8 (eight) hours as needed for nausea or vomiting.  . traMADol (ULTRAM) 50 MG tablet Take 1 tablet (50 mg total) by mouth every 8 (eight) hours as needed.     Allergies  Allergen Reactions  . Codeine Diarrhea and Nausea And Vomiting    Review of Systems Constitutional: No recent fever/chills/sweats Respiratory: No recent cough/bronchitis Cardiovascular: No chest pain Gastrointestinal: No recent  nausea/vomiting/diarrhea Genitourinary: No UTI symptoms Hematologic/lymphatic:No history of coagulopathy or recent blood thinner use    Objective:    BP 123/79 mmHg  Pulse 102  Ht  (1.753 m)  Wt 250 lb 9.6 oz (113.671 kg)  BMI 36.99 kg/m2  LMP 04/23/2015  General:   Normal  Skin:   normal  HEENT:  Normal  Neck:  Supple without Adenopathy or Thyromegaly  Lungs:   Heart:              Breasts:   Abdomen:  Pelvis:  M/S   Extremeties:  Neuro:    clear to auscultation bilaterally   Normal without murmur   Not Examined   soft, non-tender; bowel sounds normal; no masses,  no organomegaly   Exam deferred to OR  No CVAT  Warm/Dry   Normal        Labs:  Lab Results  Component Value Date   WBC 12.8* 04/15/2015   HCT 39.3 04/15/2015    04/21/2015:  IMPRESSION: No acute findings in the abdomen/pelvis. Predominately simple appearing 2.5 cm left ovarian cystic process likely a follicular cyst. Possible tiny midline periumbilical hernia containing only peritoneal fat.  The left-sided surgical clip from patient's bilateral tubal ligation lies over the cul-de-sac 5-6 cm from the expected region of the left fallopian tube.  Sub cm right renal cortical hypodensity likely a cyst but too small to characterize.  Assessment:    Dysmenorrhea Menorrhagia Pelvic pain Left ovarian cyst, 2.5 cm.  Dislodged left sterilization clip   Plan:   1. Discussed management options for abnormal uterine bleeding including tranexamic acid (Lysteda), oral progesterone, Depo Provera, Mirena IUD, endometrial ablation (Novasure Ablation) or hysterectomy as definitive surgical management.  Discussed risks and benefits of each method.   Patient desires Mirena IUD.  Printed patient education handouts were given to the patient to review at home.  2. Prescribed Vicodin for pelvic pain as she notes Ibuprofen does not work and has unwanted side effects with Tramadol as well as it being less  efficient.  Will also prescribe Zofran as patient has been known to have nausea with stronger pain meds.  3. Ovarian cyst - advised that cyst will likely resolve without intervention, however hormones from Mirena IUD will help to resolve cyst.   4. Dislodged sterilization clip - patient very concerned as she does not desire future sterility.  Advised on use of Mirena IUD for both contraception and management of bleeding, but  patient notes that she is very fertile and has been pregnant on several different types of birth control with "perfect use".  Desires repeat tubal ligation with cautery.   Counseling: Procedure, risks, reasons, benefits and complications (including injury to bowel, bladder, major blood vessel, ureter, bleeding, possibility of transfusion, infection, or fistula formation) reviewed in detail. Patient still desires procedure.  Consent signed. Preop testing ordered. Instructions reviewed, including NPO after midnight day prior to surgery.  Can place Mirena IUD at time of Laparoscopic BTL.    Hildred Laser, MD [facility]

## 2015-05-04 NOTE — Progress Notes (Addendum)
Subjective:    Patient is a 34 y.o. G51P3003 female who presents as a referral from [facility] for ovarian cyst.  Patient reports a h/o irregular cycles since onset of menses, however notes that menses has now become heavier and associated with painful cramps over the past 6 months.  Not relieved by Ibuprofen.  Has used Tramadol for cramps but notes undesirable side effects (moderate nausea, drowsiness).  Was seen by PCP recently due to persistent left sided abdominal pain outside of menses. Was told that she had an ovarian cyst and was referred to GYN.    Pertinent Gynecological History: Menses: irregular occurring approximately every 21-24 days with spotting approximately 2 days per month, followed by 7-10 days of heavy bleeding. Bleeding: dysfunctional uterine bleeding Contraception: tubal ligation Last mammogram: not yet indicated. Last pap: normal Date: 2014   Menstrual History: Menarche age: 35  Patient's last menstrual period was 04/23/2015.     OB History  Gravida Para Term Preterm AB SAB TAB Ectopic Multiple Living  3 3 3       3     # Outcome Date GA Lbr Len/2nd Weight Sex Delivery Anes PTL Lv  3 Term 2007   8 lb 1.6 oz (3.674 kg) M Vag-Spont   Y  2 Term 2004   8 lb 4.8 oz (3.765 kg) F Vag-Spont   Y  1 Term 2002   8 lb 1.8 oz (3.679 kg) F Vag-Spont   Y      Past Medical History  Diagnosis Date  . Conversion disorder   . Insomnia   . Asthma   . Bipolar 1 disorder   . Vitamin B 12 deficiency   . Vitamin D deficiency   . Depression   . Migraines   . Sleep apnea   . Ovarian cyst     left 2.5cm    Past Surgical History  Procedure Laterality Date  . Bunionectomy    . Hammer toe surgery    . Nasal sinus surgery    . Hand surgery  2014    cyst removed  . Tubal ligation  2007    Social History   Social History  . Marital Status: Married    Spouse Name: N/A  . Number of Children: N/A  . Years of Education: N/A   Social History Main Topics  .  Smoking status: Former Smoker    Quit date: 09/25/2003  . Smokeless tobacco: Never Used  . Alcohol Use: 0.0 oz/week    0 Standard drinks or equivalent per week     Comment: pt states she is a social drinker  . Drug Use: No  . Sexual Activity: Yes    Birth Control/ Protection: Surgical   Other Topics Concern  . None   Social History Narrative    Family History  Problem Relation Age of Onset  . Bipolar disorder Mother   . Anxiety disorder Mother   . Cancer Mother     brain  . Glaucoma Mother   . Heart disease Father   . Diabetes Father   . Cancer Father   . Bipolar disorder Sister   . Schizophrenia Sister   . Cancer Maternal Grandmother     lung  . Heart disease Maternal Grandfather   . Breast cancer Brother   . Ovarian cancer Neg Hx     Outpatient Encounter Prescriptions as of 05/03/2015  Medication Sig  . amitriptyline (ELAVIL) 10 MG tablet Take 10 mg by mouth at bedtime.  Marland Kitchen  buPROPion (WELLBUTRIN XL) 300 MG 24 hr tablet Take 300 mg by mouth daily.  . fluticasone (FLONASE) 50 MCG/ACT nasal spray Place 2 sprays into both nostrils daily as needed for allergies or rhinitis.  . Multiple Vitamin (MULTIVITAMIN) tablet Take 1 tablet by mouth daily.  . ondansetron (ZOFRAN) 4 MG tablet Take 1 tablet (4 mg total) by mouth every 8 (eight) hours as needed for nausea or vomiting.  . traMADol (ULTRAM) 50 MG tablet Take 1 tablet (50 mg total) by mouth every 8 (eight) hours as needed.     Allergies  Allergen Reactions  . Codeine Diarrhea and Nausea And Vomiting    Review of Systems Constitutional: No recent fever/chills/sweats Respiratory: No recent cough/bronchitis Cardiovascular: No chest pain Gastrointestinal: No recent nausea/vomiting/diarrhea Genitourinary: No UTI symptoms Hematologic/lymphatic:No history of coagulopathy or recent blood thinner use    Objective:   Blood pressure 123/79, pulse 102, height 5\' 9"  (1.753 m), weight 250 lb 9.6 oz (113.671 kg), last menstrual  period 04/23/2015. Body mass index is 36.99 kg/(m^2).  General appearance: alert and no distress, obese Abdomen: normal findings: no masses palpable and soft and abnormal findings:  mild tenderness in the lower abdomen (left>right). Pelvic: deferred at patient's request Extremities: extremities normal, atraumatic, no cyanosis or edema Neurologic: Grossly normal   Labs:  Lab Results  Component Value Date   WBC 12.8* 04/15/2015   HCT 39.3 04/15/2015    CT scan Abdomen/Pelvis 04/21/2015:  IMPRESSION: No acute findings in the abdomen/pelvis. Predominately simple appearing 2.5 cm left ovarian cystic process likely a follicular cyst. Possible tiny midline periumbilical hernia containing only peritoneal fat.  The left-sided surgical clip from patient's bilateral tubal ligation lies over the cul-de-sac 5-6 cm from the expected region of the left fallopian tube.  Sub cm right renal cortical hypodensity likely a cyst but too small to characterize.  Assessment:    Dysmenorrhea Menorrhagia Pelvic pain Left ovarian cyst, 2.5 cm.  Dislodged left sterilization clip   Plan:   1. Discussed management options for abnormal uterine bleeding including tranexamic acid (Lysteda), oral progesterone, Depo Provera, Mirena IUD, endometrial ablation (Novasure Ablation) or hysterectomy as definitive surgical management.  Discussed risks and benefits of each method.   Patient desires Mirena IUD.  Printed patient education handouts were given to the patient to review at home.  Will need to order pelvic ultrasound.   Will also give sample of OCP (Minastrin until time of surgery to help with bleeding/pain/cyst).  Patient has not been sexually active in the past month.  2. Prescribed Vicodin for pelvic pain as she notes Ibuprofen does not work and has unwanted side effects with Tramadol as well as it being less efficient.  Will also prescribe Zofran as patient has been known to have nausea with stronger pain  meds.  3. Ovarian cyst - advised that cyst will likely resolve without intervention, however hormones from Mirena IUD will help to resolve cyst.   4. Dislodged sterilization clip - patient very concerned as she does not desire future sterility.  Advised on use of Mirena IUD for both contraception and management of bleeding, but patient notes that she is very fertile and has been pregnant on several different types of birth control with "perfect use".  Desires repeat tubal ligation with cautery.   Counseling: Procedure, risks, reasons, benefits and complications (including injury to bowel, bladder, major blood vessel, ureter, bleeding, possibility of transfusion, infection, or fistula formation) reviewed in detail. Patient still desires procedure.  Consent signed. Preop testing  ordered. Instructions reviewed, including NPO after midnight day prior to surgery.  Can place Mirena IUD at time of Laparoscopic BTL.    Hildred Laser, MD [facility]

## 2015-05-05 NOTE — Addendum Note (Signed)
Addended by: Fabian November on: 05/05/2015 04:13 PM   Modules accepted: Orders

## 2015-05-06 ENCOUNTER — Ambulatory Visit
Admission: RE | Admit: 2015-05-06 | Discharge: 2015-05-06 | Disposition: A | Discharge status: Home / Self Care | Payer: Managed Care, Other (non HMO) | Source: Ambulatory Visit | Attending: Obstetrics and Gynecology | Admitting: Obstetrics and Gynecology

## 2015-05-06 ENCOUNTER — Encounter: Payer: Self-pay | Admitting: *Deleted

## 2015-05-06 ENCOUNTER — Other Ambulatory Visit: Payer: Managed Care, Other (non HMO)

## 2015-05-06 DIAGNOSIS — R102 Pelvic and perineal pain unspecified side: Secondary | ICD-10-CM

## 2015-05-06 DIAGNOSIS — N921 Excessive and frequent menstruation with irregular cycle: Secondary | ICD-10-CM | POA: Diagnosis present

## 2015-05-06 NOTE — Patient Instructions (Signed)
  Your procedure is scheduled on: 05-09-15 Report to [facility] SAME DAY SURGERY 2ND FLOOR @ 8:45 PER PT   Remember: Instructions that are not followed completely may result in serious medical risk, up to and including death, or upon the discretion of your surgeon and anesthesiologist your surgery may need to be rescheduled.    _X___ 1. Do not eat food or drink liquids after midnight. No gum chewing or hard candies.     _X___ 2. No Alcohol for 24 hours before or after surgery.   ____ 3. Bring all medications with you on the day of surgery if instructed.    ____ 4. Notify your doctor if there is any change in your medical condition     (cold, fever, infections).     Do not wear jewelry, make-up, hairpins, clips or nail polish.  Do not wear lotions, powders, or perfumes. You may wear deodorant.  Do not shave 48 hours prior to surgery. Men may shave face and neck.  Do not bring valuables to the hospital.    [facility] is not responsible for any belongings or valuables.               Contacts, dentures or bridgework may not be worn into surgery.  Leave your suitcase in the car. After surgery it may be brought to your room.  For patients admitted to the hospital, discharge time is determined by your  treatment team.   Patients discharged the day of surgery will not be allowed to drive home.   Please read over the following fact sheets that you were given:    ____ Take these medicines the morning of surgery with A SIP OF WATER:    1. NONE-PT TOLD TO TAKE WELLBUTRIN BUT SHE CANT TAKE IT WITH A SMALL SIP OF WATER  2.   3.   4.  5.  6.  ____ Fleet Enema (as directed)   ____ Use CHG Soap as directed  ____ Use inhalers on the day of surgery  ____ Stop metformin 2 days prior to surgery    ____ Take 1/2 of usual insulin dose the night before surgery and none on the morning of surgery.   ____ Stop Coumadin/Plavix/aspirin-N/A  ____ Stop Anti-inflammatories-NO NSAIDS OR ASA  PRODUCTS-HYDROCODONE OK TO TAKE   ____ Stop supplements until after surgery.    ____ Bring C-Pap to the hospital.

## 2015-05-06 NOTE — Pre-Procedure Instructions (Signed)
CALLED DR CHERRYS OFFICE AND SPOKE WITH ROBIN ABOUT NOT HAVING ANY ORDERS IN EPIC-H&P IN EPIC-ROBIN TO NOTIFY DR CHERRY-CHART SENT ON OVER TO SDS FOR MONDAY

## 2015-05-09 ENCOUNTER — Encounter: Payer: Self-pay | Admitting: *Deleted

## 2015-05-09 ENCOUNTER — Encounter
Admission: RE | Disposition: A | Discharge status: Home / Self Care | Payer: Self-pay | Source: Ambulatory Visit | Attending: Obstetrics and Gynecology

## 2015-05-09 ENCOUNTER — Ambulatory Visit: Payer: Managed Care, Other (non HMO) | Admitting: Registered Nurse

## 2015-05-09 ENCOUNTER — Ambulatory Visit
Admission: RE | Admit: 2015-05-09 | Discharge: 2015-05-09 | Disposition: A | Discharge status: Home / Self Care | Payer: Managed Care, Other (non HMO) | Source: Ambulatory Visit | Attending: Obstetrics and Gynecology | Admitting: Obstetrics and Gynecology

## 2015-05-09 DIAGNOSIS — E669 Obesity, unspecified: Secondary | ICD-10-CM | POA: Diagnosis not present

## 2015-05-09 DIAGNOSIS — N920 Excessive and frequent menstruation with regular cycle: Secondary | ICD-10-CM

## 2015-05-09 DIAGNOSIS — J45909 Unspecified asthma, uncomplicated: Secondary | ICD-10-CM | POA: Diagnosis not present

## 2015-05-09 DIAGNOSIS — Z87891 Personal history of nicotine dependence: Secondary | ICD-10-CM | POA: Diagnosis not present

## 2015-05-09 DIAGNOSIS — T85628A Displacement of other specified internal prosthetic devices, implants and grafts, initial encounter: Secondary | ICD-10-CM | POA: Diagnosis not present

## 2015-05-09 DIAGNOSIS — Z9851 Tubal ligation status: Secondary | ICD-10-CM | POA: Diagnosis not present

## 2015-05-09 DIAGNOSIS — G4733 Obstructive sleep apnea (adult) (pediatric): Secondary | ICD-10-CM | POA: Insufficient documentation

## 2015-05-09 DIAGNOSIS — F519 Sleep disorder not due to a substance or known physiological condition, unspecified: Secondary | ICD-10-CM | POA: Insufficient documentation

## 2015-05-09 DIAGNOSIS — Y768 Miscellaneous obstetric and gynecological devices associated with adverse incidents, not elsewhere classified: Secondary | ICD-10-CM | POA: Insufficient documentation

## 2015-05-09 DIAGNOSIS — N946 Dysmenorrhea, unspecified: Secondary | ICD-10-CM | POA: Insufficient documentation

## 2015-05-09 DIAGNOSIS — Z6836 Body mass index (BMI) 36.0-36.9, adult: Secondary | ICD-10-CM | POA: Diagnosis not present

## 2015-05-09 HISTORY — DX: Methicillin resistant Staphylococcus aureus infection, unspecified site: A49.02

## 2015-05-09 HISTORY — PX: LAPAROSCOPIC TUBAL LIGATION: SHX1937

## 2015-05-09 HISTORY — DX: Anemia, unspecified: D64.9

## 2015-05-09 HISTORY — PX: INTRAUTERINE DEVICE (IUD) INSERTION: SHX5877

## 2015-05-09 LAB — TSH: TSH: 1.929 u[IU]/mL (ref 0.350–4.500)

## 2015-05-09 LAB — POCT PREGNANCY, URINE: Preg Test, Ur: NEGATIVE

## 2015-05-09 SURGERY — LIGATION, FALLOPIAN TUBE, LAPAROSCOPIC
Anesthesia: General

## 2015-05-09 MED ORDER — HYDROCODONE-ACETAMINOPHEN 5-325 MG PO TABS
ORAL_TABLET | ORAL | Status: AC
  Administered 2015-05-09: 1 via ORAL
  Filled 2015-05-09: qty 1

## 2015-05-09 MED ORDER — BUPIVACAINE HCL (PF) 0.5 % IJ SOLN
INTRAMUSCULAR | Status: DC | PRN
  Administered 2015-05-09: 10 mL

## 2015-05-09 MED ORDER — FAMOTIDINE 20 MG PO TABS
ORAL_TABLET | ORAL | Status: AC
  Administered 2015-05-09: 20 mg via ORAL
  Filled 2015-05-09: qty 1

## 2015-05-09 MED ORDER — ROCURONIUM BROMIDE 100 MG/10ML IV SOLN
INTRAVENOUS | Status: DC | PRN
  Administered 2015-05-09: 50 mg via INTRAVENOUS

## 2015-05-09 MED ORDER — FAMOTIDINE 20 MG PO TABS
20.0000 mg | ORAL_TABLET | Freq: Once | ORAL | Status: AC
  Administered 2015-05-09: 20 mg via ORAL

## 2015-05-09 MED ORDER — HYDROCODONE-ACETAMINOPHEN 5-325 MG PO TABS
1.0000 | ORAL_TABLET | Freq: Four times a day (QID) | ORAL | Status: DC | PRN
Start: 2015-05-09 — End: 2015-05-09
  Administered 2015-05-09: 1 via ORAL

## 2015-05-09 MED ORDER — LACTATED RINGERS IV SOLN
INTRAVENOUS | Status: DC
  Administered 2015-05-09 (×2): via INTRAVENOUS

## 2015-05-09 MED ORDER — FENTANYL CITRATE (PF) 100 MCG/2ML IJ SOLN
25.0000 ug | INTRAMUSCULAR | Status: DC | PRN
  Administered 2015-05-09 (×5): 25 ug via INTRAVENOUS

## 2015-05-09 MED ORDER — ONDANSETRON HCL 4 MG/2ML IJ SOLN
INTRAMUSCULAR | Status: AC
  Filled 2015-05-09: qty 2

## 2015-05-09 MED ORDER — ONDANSETRON HCL 4 MG/2ML IJ SOLN
INTRAMUSCULAR | Status: DC | PRN
  Administered 2015-05-09: 4 mg via INTRAVENOUS

## 2015-05-09 MED ORDER — MIDAZOLAM HCL 2 MG/2ML IJ SOLN
INTRAMUSCULAR | Status: DC | PRN
  Administered 2015-05-09: 2 mg via INTRAVENOUS

## 2015-05-09 MED ORDER — HYDROCODONE-ACETAMINOPHEN 5-325 MG PO TABS: 1.0000 | ORAL_TABLET | Freq: Four times a day (QID) | ORAL | Status: DC | PRN

## 2015-05-09 MED ORDER — FENTANYL CITRATE (PF) 100 MCG/2ML IJ SOLN
INTRAMUSCULAR | Status: DC | PRN
  Administered 2015-05-09: 100 ug via INTRAVENOUS

## 2015-05-09 MED ORDER — PROPOFOL 10 MG/ML IV BOLUS
INTRAVENOUS | Status: DC | PRN
  Administered 2015-05-09: 150 mg via INTRAVENOUS

## 2015-05-09 MED ORDER — LIDOCAINE HCL (CARDIAC) 20 MG/ML IV SOLN
INTRAVENOUS | Status: DC | PRN
  Administered 2015-05-09: 100 mg via INTRAVENOUS

## 2015-05-09 MED ORDER — DEXAMETHASONE SODIUM PHOSPHATE 4 MG/ML IJ SOLN
INTRAMUSCULAR | Status: DC | PRN
  Administered 2015-05-09: 10 mg via INTRAVENOUS

## 2015-05-09 MED ORDER — ACETAMINOPHEN 10 MG/ML IV SOLN
INTRAVENOUS | Status: AC
  Filled 2015-05-09: qty 100

## 2015-05-09 MED ORDER — ACETAMINOPHEN 10 MG/ML IV SOLN
INTRAVENOUS | Status: DC | PRN
  Administered 2015-05-09: 1000 mg via INTRAVENOUS

## 2015-05-09 MED ORDER — SUGAMMADEX SODIUM 200 MG/2ML IV SOLN
INTRAVENOUS | Status: DC | PRN
  Administered 2015-05-09: 250 mg via INTRAVENOUS

## 2015-05-09 MED ORDER — GLYCOPYRROLATE 0.2 MG/ML IJ SOLN
INTRAMUSCULAR | Status: DC | PRN
  Administered 2015-05-09: 0.2 mg via INTRAVENOUS

## 2015-05-09 MED ORDER — ONDANSETRON HCL 4 MG PO TABS: 4.0000 mg | ORAL_TABLET | Freq: Three times a day (TID) | ORAL | Status: DC | PRN

## 2015-05-09 MED ORDER — ONDANSETRON HCL 4 MG/2ML IJ SOLN
4.0000 mg | Freq: Once | INTRAMUSCULAR | Status: AC | PRN
  Administered 2015-05-09: 4 mg via INTRAVENOUS

## 2015-05-09 MED ORDER — FENTANYL CITRATE (PF) 100 MCG/2ML IJ SOLN
INTRAMUSCULAR | Status: AC
  Filled 2015-05-09: qty 2

## 2015-05-09 MED ORDER — BUPIVACAINE HCL (PF) 0.5 % IJ SOLN
INTRAMUSCULAR | Status: AC
  Filled 2015-05-09: qty 30

## 2015-05-09 MED ORDER — KETOROLAC TROMETHAMINE 30 MG/ML IJ SOLN
INTRAMUSCULAR | Status: DC | PRN
  Administered 2015-05-09: 30 mg via INTRAVENOUS

## 2015-05-09 MED ORDER — PHENYLEPHRINE HCL 10 MG/ML IJ SOLN
INTRAMUSCULAR | Status: DC | PRN
  Administered 2015-05-09: 100 ug via INTRAVENOUS

## 2015-05-09 SURGICAL SUPPLY — 29 items
BLADE SURG SZ11 CARB STEEL (BLADE) ×3 IMPLANT
CATH ROBINSON RED A/P 16FR (CATHETERS) ×3 IMPLANT
CHLORAPREP W/TINT 26ML (MISCELLANEOUS) ×3 IMPLANT
CUP MEDICINE 2OZ PLAST GRAD ST (MISCELLANEOUS) IMPLANT
DRAPE UNDER BUTTOCK W/FLU (DRAPES) IMPLANT
DRSG TELFA 3X8 NADH (GAUZE/BANDAGES/DRESSINGS) ×3 IMPLANT
GLOVE BIO SURGEON STRL SZ 6 (GLOVE) ×3 IMPLANT
GLOVE BIOGEL PI IND STRL 6.5 (GLOVE) ×6 IMPLANT
GLOVE BIOGEL PI INDICATOR 6.5 (GLOVE) ×3
GOWN STRL REUS W/ TWL LRG LVL3 (GOWN DISPOSABLE) ×6 IMPLANT
GOWN STRL REUS W/TWL LRG LVL3 (GOWN DISPOSABLE) ×3
KIT RM TURNOVER CYSTO AR (KITS) ×3 IMPLANT
LABEL OR SOLS (LABEL) ×3 IMPLANT
NS IRRIG 500ML POUR BTL (IV SOLUTION) ×3 IMPLANT
PACK DNC HYST (MISCELLANEOUS) IMPLANT
PACK GYN LAPAROSCOPIC (MISCELLANEOUS) ×3 IMPLANT
PAD OB MATERNITY 4.3X12.25 (PERSONAL CARE ITEMS) ×3 IMPLANT
PAD PREP 24X41 OB/GYN DISP (PERSONAL CARE ITEMS) ×3 IMPLANT
SCISSORS METZENBAUM CVD 33 (INSTRUMENTS) IMPLANT
SOL PREP PVP 2OZ (MISCELLANEOUS) ×3
SOLUTION PREP PVP 2OZ (MISCELLANEOUS) ×2 IMPLANT
SPONGE XRAY 4X4 16PLY STRL (MISCELLANEOUS) IMPLANT
SUT VIC AB 0 CT2 27 (SUTURE) ×3 IMPLANT
SUT VIC AB 2-0 UR6 27 (SUTURE) ×3 IMPLANT
SUT VIC AB 4-0 FS2 27 (SUTURE) ×3 IMPLANT
TOWEL OR 17X26 4PK STRL BLUE (TOWEL DISPOSABLE) ×3 IMPLANT
TROCAR 12M 150ML BLUNT (TROCAR) ×3 IMPLANT
TROCAR ENDO BLADELESS 11MM (ENDOMECHANICALS) ×3 IMPLANT
TUBING INSUFFLATOR HI FLOW (MISCELLANEOUS) ×3 IMPLANT

## 2015-05-09 NOTE — Anesthesia Postprocedure Evaluation (Signed)
  Anesthesia Post-op Note  Patient: Emily [name]  Procedure(s) Performed: Procedure(s): LAPAROSCOPIC TUBAL LIGATION (Bilateral) INTRAUTERINE DEVICE (IUD) INSERTION (N/A)  Anesthesia type:General  Patient location: PACU  Post pain: Pain level controlled  Post assessment: Post-op Vital signs reviewed, Patient's Cardiovascular Status Stable, Respiratory Function Stable, Patent Airway and No signs of Nausea or vomiting  Post vital signs: Reviewed and stable  Last Vitals:  Filed Vitals:   05/09/15 1334  BP:   Pulse: 72  Temp:   Resp: 15    Level of consciousness: awake, alert  and patient cooperative  Complications: No apparent anesthesia complications

## 2015-05-09 NOTE — Anesthesia Procedure Notes (Signed)
Procedure Name: Intubation Date/Time: 05/09/2015 11:47 AM Performed by: Stormy Fabian Pre-anesthesia Checklist: Patient identified, Patient being monitored, Timeout performed, Emergency Drugs available and Suction available Patient Re-evaluated:Patient Re-evaluated prior to inductionOxygen Delivery Method: Circle system utilized Preoxygenation: Pre-oxygenation with 100% oxygen Intubation Type: IV induction Ventilation: Mask ventilation without difficulty Laryngoscope Size: Mac and 3 Grade View: Grade I Tube type: Oral Tube size: 7.0 mm Number of attempts: 1 Airway Equipment and Method: Stylet Placement Confirmation: ETT inserted through vocal cords under direct vision,  positive ETCO2 and breath sounds checked- equal and bilateral Secured at: 21 cm Tube secured with: Tape Dental Injury: Teeth and Oropharynx as per pre-operative assessment

## 2015-05-09 NOTE — OR Nursing (Signed)
mirena lot:  [id number] Exp:  02/19

## 2015-05-09 NOTE — Op Note (Signed)
Laparoscopic Tubal Ligation with Mirena IUD Insertion Procedure Note  Indications: The patient is a 34 y.o. G75P3003 female with h/o prior tubal ligation, now with dislodged left Filsche clip, also with menorrhagia and dysmenorrhea.  Pre-operative Diagnosis: H/o prior tubal ligation, dislodged Filsche clip, menorrhagia, dysmenorrhea  Post-operative Diagnosis: Same  Surgeon: Hildred Laser, MD   Assistants: None  Anesthesia: General endotracheal anesthesia  ASA Class: 3  Procedure Details  The patient was seen in the Holding Room. The risks, benefits, complications, treatment options, and expected outcomes were discussed with the patient. The possibilities of reaction to medication, pulmonary aspiration, perforation of viscus, bleeding, recurrent infection, the need for additional procedures, failure to diagnose a condition, and creating a complication requiring transfusion or operation were discussed with the patient. The patient concurred with the proposed plan, giving informed consent. The patient was taken to the Operating Room, identified as Emily [name] and the procedure verified as Laparoscopic Bilateral Tubal Ligation with Mirena IUD insertion. A Time Out was held and the above information confirmed.  After induction of general anesthesia, the patient was placed in dorsal lithotomy position where she was prepped, draped, and catheterized in the normal, sterile fashion.  A sterile speculum was inserted into the vagina.  The cervix was visualized and a Hulka clamp was placed for uterine manipulation.  The speculum was removed from the vagina.   An 11 cm umbilical incision was then performed. The incision was then injected with 10 cc of Sensorcaine. The 11 mm trochar and sheath were advanced into the abdominal cavity under direct visualization using the Optiview trochar. After entrance into the abdominal cavity was confirmed, a pneumoperitoneum was established. The above findings were noted.    The fallopian tubes were observed and found to be normal in appearance. The left Filsche clip was not visualized in the abdominal or pelvic cavity.  The left fallopian tube was noted to be previously surgically interrupted, with ~ 3 cm distance between segment ends.  The right fallopian tube was also noted to be surgically interrupted with a distance of ~ 1.5 cm between the 2 segments, with the Filsche clip noted to be attached to one of the segment ends, however was not properly applied. Was noted to be scarred to proximal segment, and was not able to be successfully removed due to being directly adjacent to the mesosalpinx. Decision was made to use bipolar cautery to distal ends.  Bipolar forceps was then advanced through the operative port and used to coagulate another 3 cm portion of the right tube distal to the Filsche clip .  Good blanching and coagulation was noted at the site of the application.  There was no bleeding noted in the mesosalpinx.  Good hemostasis was noted overall.   The instruments were then removed from the patient's abdomen and the fascial incision was repaired with 2-0 Vicryl, and the skin was closed with 4-0 Vicryl and Liquiband.  The uterine manipulator was then removed from the vagina.  The cervix was grasped anteriorly with a single tooth tenaculum.  The uterus was sounded to 10 cm. A curettage was performed, with tissue collected to send to pathology. Mirena IUD placed per manufacturer's recommendations.  Strings trimmed to 3 cm. The tenaculum was removed with good hemostasis noted.   The patient tolerated the procedure well.  Sponge, lap, and needle counts were correct times two.  The patient was then taken to the recovery room awake, extubated and in stable condition.   Findings: Normal appearing  uterus, 10 week size Bilateral fallopian tubes, previously surgically interrupted.  Visualization of right Filsche clip, adherent (scarred) to posterior aspect of proximal  tubal segment, only ~ 1.5 cm distance between tubal segments.    No visualization of left Filsche clip.  Approximately 3 cm distance between interrupted tubal segments.  Right ovary with small simple cyst, ~ 3 cm.  Normal appearing left ovary.  Normal appearing abdomen and pelvis without adhesions.  Cul-de-sac without lesions or adhesions.   Estimated Blood Loss:  Minimal         Drains: straight catheterization at beginning of case with 300 cc clear urine.          Total IV Fluids:  600 mL         Specimens: None              Complications:  None; patient tolerated the procedure well.         Disposition: PACU - hemodynamically stable.         Condition: stable   SIGNED:  Hildred Laser, MD [facility]

## 2015-05-09 NOTE — Transfer of Care (Signed)
Immediate Anesthesia Transfer of Care Note  Patient: Emily [name]  Procedure(s) Performed: Procedure(s): LAPAROSCOPIC TUBAL LIGATION (Bilateral) INTRAUTERINE DEVICE (IUD) INSERTION (N/A)  Patient Location: PACU  Anesthesia Type:General  Level of Consciousness: sedated  Airway & Oxygen Therapy: Patient Spontanous Breathing and Patient connected to face mask oxygen  Post-op Assessment: Report given to RN and Post -op Vital signs reviewed and stable  Post vital signs: Reviewed and stable  Last Vitals:  Filed Vitals:   05/09/15 1307  BP: 122/76  Pulse: 86  Temp: 36.3 C  Resp: 18    Complications: No apparent anesthesia complications

## 2015-05-09 NOTE — H&P (Signed)
UPDATE TO PREVIOUS HISTORY AND PHYSICAL  The patient has been seen and examined.  H&P is up to date, no changes noted.  Patient desires repeat sterilization due to dislodged surgical clip.  Also for Mirena IUD placement for abnormal bleeding.  Patient can proceed to the OR for scheduled procedure.   Hildred Laser, MD 05/09/2015 10:57 AM

## 2015-05-09 NOTE — Discharge Instructions (Signed)

## 2015-05-09 NOTE — Anesthesia Preprocedure Evaluation (Signed)
Anesthesia Evaluation  Patient identified by MRN, date of birth, ID band Patient awake    Reviewed: Allergy & Precautions, NPO status , Patient's Chart, lab work & pertinent test results  History of Anesthesia Complications Negative for: history of anesthetic complications  Airway Mallampati: I  TM Distance: >3 FB Neck ROM: Full    Dental  (+) Teeth Intact   Pulmonary asthma , sleep apnea , former smoker,  Mild exercise induced and not on regular inhalers. Has OSA, is not using CPAP.   Pulmonary exam normal       Cardiovascular Exercise Tolerance: Good negative cardio ROS Normal cardiovascular exam    Neuro/Psych    GI/Hepatic   Endo/Other    Renal/GU      Musculoskeletal   Abdominal (+) + obese,  Abdomen: soft.    Peds  Hematology   Anesthesia Other Findings   Reproductive/Obstetrics Beta HCG is negative today.                             Anesthesia Physical Anesthesia Plan  ASA: III  Anesthesia Plan: General   Post-op Pain Management:    Induction: Intravenous  Airway Management Planned: Oral ETT  Additional Equipment:   Intra-op Plan:   Post-operative Plan: Extubation in OR  Informed Consent: I have reviewed the patients History and Physical, chart, labs and discussed the procedure including the risks, benefits and alternatives for the proposed anesthesia with the patient or authorized representative who has indicated his/her understanding and acceptance.     Plan Discussed with: CRNA  Anesthesia Plan Comments:         Anesthesia Quick Evaluation

## 2015-05-10 ENCOUNTER — Other Ambulatory Visit: Payer: Self-pay | Admitting: Family Medicine

## 2015-05-10 ENCOUNTER — Encounter: Payer: Self-pay | Admitting: Obstetrics and Gynecology

## 2015-05-10 LAB — SURGICAL PATHOLOGY

## 2015-05-11 ENCOUNTER — Encounter: Payer: Self-pay | Admitting: Family Medicine

## 2015-05-12 ENCOUNTER — Encounter: Payer: Managed Care, Other (non HMO) | Admitting: Obstetrics and Gynecology

## 2015-06-01 ENCOUNTER — Ambulatory Visit (INDEPENDENT_AMBULATORY_CARE_PROVIDER_SITE_OTHER): Payer: Managed Care, Other (non HMO) | Admitting: Obstetrics and Gynecology

## 2015-06-01 VITALS — BP 117/76 | HR 73 | Ht 69.0 in | Wt 259.1 lb

## 2015-06-01 DIAGNOSIS — Z30431 Encounter for routine checking of intrauterine contraceptive device: Secondary | ICD-10-CM

## 2015-06-01 DIAGNOSIS — Z9889 Other specified postprocedural states: Secondary | ICD-10-CM

## 2015-06-01 DIAGNOSIS — T8384XA Pain from genitourinary prosthetic devices, implants and grafts, initial encounter: Secondary | ICD-10-CM

## 2015-06-01 NOTE — Patient Instructions (Signed)
To have ultrasound of pelvis within 1 week (can be at hospital or office) for IUD location.  Will call with results.

## 2015-06-02 NOTE — Progress Notes (Signed)
Subjective:     Emily [name] is a 34 y.o. G36P3003 female who presents to the clinic 3 weeks status post laparoscopywith bilateral tubal coagulation and D&C with IUD insertion for abnormal uterine bleeding and requested sterilization (patient with h/o prior BTL with displaced Filsche clip). Eating a regular diet without difficulty. Bowel movements are normal. Pain is controlled with current analgesics. Medications being used: prescription NSAID's including ibuprofen (Motrin) and narcotic analgesics including oxycodone/acetaminophen (Percocet, Tylox).  Patient reports that the pain is still pretty intense from more often than not, as well as lots of pelvic cramping (notes it did not hurt as bad with 1st IUD). Also still noting moderate vaginal bleeding.   The following portions of the patient's history were reviewed and updated as appropriate: allergies, current medications, past family history, past medical history, past social history, past surgical history and problem list.  Review of Systems Pertinent items are noted in HPI.    Objective:    BP 117/76 mmHg  Pulse 73  Ht  (1.753 m)  Wt 259 lb 1.6 oz (117.527 kg)  BMI 38.24 kg/m2  LMP 06/01/2015 General:  alert and no distress  Abdomen: soft, bowel sounds active, mildly tender in lower abdomen  Incision:   healing well, no drainage, no erythema, no hernia, no seroma, no swelling, no dehiscence, incision well approximated  Pelvis:  External genitalia normal, vagina with small amount of dark red blood in vault, cervix normal appearing, no lesions, IUD strings visible from os ~ 3 cm. Uterus mobile, slightly tender.  Normal adnexae.        Pathology 05/09/2015:  A. ENDOMETRIUM; CURETTINGS:  - PROLIFERATIVE ENDOMETRIUM WITH BREAKDOWN.  - FRAGMENTS SUGGESTIVE OF ENDOMETRIAL POLYP.  - UNREMARKABLE ENDOCERVICAL GLANDULAR TISSUE.  - NEGATIVE FOR ATYPIA AND MALIGNANCY.   Assessment:    Postoperative course complicated by pain Operative  findings reviewed. Pathology report discussed.    Plan:   1. Continue any current medications. 2. Will order pelvic ultrasound to ensure no issues with IUD.  3. Activity restrictions: none 4. Anticipated return to work: not applicable. 5. Follow up: 1 week by phone to discuss results of ultrasound.  RTC in 2-3 weeks to reassess pain.   Hildred Laser, MD [facility]

## 2015-06-10 ENCOUNTER — Ambulatory Visit: Payer: Managed Care, Other (non HMO)

## 2015-06-10 DIAGNOSIS — T8384XA Pain from genitourinary prosthetic devices, implants and grafts, initial encounter: Secondary | ICD-10-CM

## 2015-06-14 ENCOUNTER — Ambulatory Visit (INDEPENDENT_AMBULATORY_CARE_PROVIDER_SITE_OTHER): Payer: Managed Care, Other (non HMO) | Admitting: Obstetrics and Gynecology

## 2015-06-14 ENCOUNTER — Encounter: Payer: Self-pay | Admitting: Obstetrics and Gynecology

## 2015-06-14 VITALS — BP 117/79 | HR 79 | Ht 69.0 in | Wt 260.1 lb

## 2015-06-14 DIAGNOSIS — Z975 Presence of (intrauterine) contraceptive device: Secondary | ICD-10-CM

## 2015-06-14 DIAGNOSIS — N920 Excessive and frequent menstruation with regular cycle: Secondary | ICD-10-CM

## 2015-06-14 DIAGNOSIS — R102 Pelvic and perineal pain: Secondary | ICD-10-CM

## 2015-06-14 NOTE — Patient Instructions (Signed)
Laparoscopically Assisted Vaginal Hysterectomy  A laparoscopically assisted vaginal hysterectomy (LAVH) is a surgical procedure to remove the uterus and cervix, and sometimes the ovaries and fallopian tubes. During an LAVH, some of the surgical removal is done through the vagina, and the rest is done through a few small surgical cuts (incisions) in the abdomen.  This procedure is usually considered in women when a vaginal hysterectomy is not an option. Your health care provider will discuss the risks and benefits of the different surgical techniques at your appointment. Generally, recovery time is faster and there are fewer complications after laparoscopic procedures than after open incisional procedures. LET YOUR HEALTH CARE PROVIDER KNOW ABOUT:   Any allergies you have.  All medicines you are taking, including vitamins, herbs, eye drops, creams, and over-the-counter medicines.  Previous problems you or members of your family have had with the use of anesthetics.  Any blood disorders you have.  Previous surgeries you have had.  Medical conditions you have. RISKS AND COMPLICATIONS Generally, this is a safe procedure. However, as with any procedure, complications can occur. Possible complications include:  Allergies to medicines.  Difficulty breathing.  Bleeding.  Infection.  Damage to other structures near your uterus and cervix. BEFORE THE PROCEDURE  Ask your health care provider about changing or stopping your regular medicines.  Take certain medicines, such as a colon-emptying preparation, as directed.  Do not eat or drink anything for at least 8 hours before your surgery.  Stop smoking if you smoke. Stopping will improve your health after surgery.  Arrange for a ride home after surgery and for help at home during recovery. PROCEDURE   An IV tube will be put into one of your veins in order to give you fluids and medicines.  You will receive medicines to relax you and  medicines that make you sleep (general anesthetic).  You may have a flexible tube (catheter) put into your bladder to drain urine.  You may have a tube put through your nose or mouth that goes into your stomach (nasogastric tube). The nasogastric tube removes digestive fluids and prevents you from feeling nauseated and from vomiting.  Tight-fitting (compression) stockings will be placed on your legs to promote circulation.  Three to four small incisions will be made in your abdomen. An incision also will be made in your vagina. Probes and tools will be inserted into the small incisions. The uterus and cervix are removed (and possibly your ovaries and fallopian tubes) through your vagina as well as through the small incisions that were made in the abdomen.  Your vagina is then sewn back to normal. AFTER THE PROCEDURE  You may have a liquid diet temporarily. You will most likely return to, and tolerate, your usual diet the day after surgery.  You will be passing urine through a catheter. It will be removed the day after surgery.  Your temperature, breathing rate, heart rate, blood pressure, and oxygen level will be monitored regularly.  You will still wear compression stockings on your legs until you are able to move around.  You will use a special device or do breathing exercises to keep your lungs clear.  You will be encouraged to walk as soon as possible. Document Released: 08/30/2011 Document Revised: 05/13/2013 Document Reviewed: 03/26/2013 Exit[employer] Patient Information 2015 [employer]. This information is not intended to replace advice given to you by your health care provider. Make sure you discuss any questions you have with your health care provider.  

## 2015-06-14 NOTE — Progress Notes (Signed)
Subjective:     Emily [name] is a 34 y.o. G21P3003 female who presents for f/u of pelvic pain. Patient reports that the pain is still pretty intense from more often than not, as well as lots of pelvic cramping (notes it did not hurt as bad with 1st IUD). Still requires strong pain meds. Bleeding has finally discontinued.  Had ultrasound performed last week to assess location of (ensure that IUD in proper place, not embedded or perforating).  The following portions of the patient's history were reviewed and updated as appropriate: allergies, current medications, past family history, past medical history, past social history, past surgical history and problem list.  Review of Systems Pertinent items are noted in HPI.    Objective:    BP 117/79 mmHg  Pulse 79  Ht  (1.753 m)  Wt 260 lb 2 oz (117.992 kg)  BMI 38.40 kg/m2  LMP 06/01/2015 General:  alert and no distress  Abdomen: soft, bowel sounds active, mildly tender in lower abdomen  Incision:   healing well, no drainage, no erythema, no hernia, no seroma, no swelling, no dehiscence, incision well approximated  Pelvis:  External genitalia normal, vagina with small amount of dark red blood in vault, cervix normal appearing, no lesions, IUD strings visible from os ~ 3 cm. Uterus mobile, slightly tender.  Normal adnexae.        06/10/2015 Pelvic Ultrasound:  Indications:Pelvic pain with IUD Findings:  The uterus measures 10.5 x 5.0 x 6.0 cm. Echo texture is homogenous without evidence of focal masses.  The Endometrium measures 3.6 mm. There is an IUD situated in the midline towards the fundus.  Right Ovary measures 3.5 x 3.1 x 3.5 cm. It is normal in appearance. Left Ovary measures 1.7 x 1.7 x 2.4 cm. It is normal appearance. Survey of the adnexa demonstrates no adnexal masses. There is no free fluid in the cul de sac.  Impression: 1. IUD appears to be within the endometrium at the proper level.  Recommendations: 1.Clinical  correlation with the patient's History and Physical Exam.  Assessment:   Pelvic pain Menorrhagia Mirena IUD in place.   Plan:   1. Ultrasound notes IUD is in place, patient continues to complain of pain and previously noted continued heavy bleeding at last visit.  Discussed options including removal/reinsertion of new IUD, removal of IUD with alternative form of contraception to manage menses, or surgical management (endometrial ablation/hysterectomy). Patient now desires definitive management with hysterectomy and declines further medical management. Declines removal of IUD today.  2. Patient desires surgical management with hysterectomy.  The risks of surgery were discussed in detail with the patient including but not limited to: bleeding which may require transfusion or reoperation; infection which may require prolonged hospitalization or re-hospitalization and antibiotic therapy; injury to bowel, bladder, ureters and major vessels or other surrounding organs; need for additional procedures including laparotomy; thromboembolic phenomenon, incisional problems and other postoperative or anesthesia complications.  Patient was told that the likelihood that her condition and symptoms will be treated effectively with this surgical management was very high; the postoperative expectations were also discussed in detail. The patient also understands the alternative treatment options which were discussed in full. All questions were answered.  She was told that she will be contacted by our surgical scheduler regarding the time and date of her surgery; routine preoperative instructions of having nothing to eat or drink after midnight on the day prior to surgery and also coming to the hospital 1.5 hours  prior to her time of surgery were also emphasized.  She was told she may be called for a preoperative appointment about a week prior to surgery and will be given further preoperative instructions at that visit.  Printed patient education handouts about the procedure were given to the patient to review at home.  Scheduled for 06/20/2015 for TVH vs LAVH, with bilateral salpingectomy.    A total of 15 minutes were spent face-to-face with the patient during this encounter and over half of that time dealt with counseling and coordination of care.   Hildred Laser, MD [facility]

## 2015-06-14 NOTE — H&P (Signed)
Subjective:    Patient is a 34 y.o. G18P3003 female scheduled for TVH vs LAVH with bilateral salpingectomy. Indications for procedure are pelvic pain, menorrhagia.   Pertinent Gynecological History: Menses: flow is excessive with use of 6-7 pads or tampons on heaviest days Bleeding: dysfunctional uterine bleeding Contraception: tubal ligation.  Currently also using IUD for management of heavy menses Last pap: normal Date: 2014  Discussed Blood/Blood Products: yes   Menstrual History: OB History    Gravida Para Term Preterm AB TAB SAB Ectopic Multiple Living   Menarche age: 12 Patient's last menstrual period was 06/01/2015.  Denies h/o abnormal pap smears or STIs.   Past Medical History  Diagnosis Date  . Conversion disorder   . Insomnia   . Bipolar 1 disorder   . Vitamin B 12 deficiency   . Vitamin D deficiency   . Depression   . Migraines   . Sleep apnea   . Ovarian cyst     left 2.5cm  . Anemia   . Complication of anesthesia     PT HAS AWAKENED DURING BOTH OF BUNIONECTOMIES  . Asthma     sports induced-no inhalers  . MRSA infection 2010    Past Surgical History  Procedure Laterality Date  . Bunionectomy    . Hammer toe surgery    . Nasal sinus surgery    . Hand surgery  2014    cyst removed  . Tubal ligation  2007  . Laparoscopic tubal ligation Bilateral 05/09/2015    Procedure: LAPAROSCOPIC TUBAL LIGATION;  Surgeon: Hildred Laser, MD;  Location: [facility] ORS;  Service: Gynecology;  Laterality: Bilateral;  . Intrauterine device (iud) insertion N/A 05/09/2015    Procedure: INTRAUTERINE DEVICE (IUD) INSERTION;  Surgeon: Hildred Laser, MD;  Location: [facility] ORS;  Service: Gynecology;  Laterality: N/A;    OB History  Gravida Para Term Preterm AB SAB TAB Ectopic Multiple Living  # Outcome Date GA Lbr Len/2nd Weight Sex Delivery Anes PTL Lv  3 Term 2007   8 lb 1.6 oz (3.674 kg) M Vag-Spont   Y  2 Term 2004   8 lb 4.8 oz (3.765 kg) F  Vag-Spont   Y  1 Term 2002   8 lb 1.8 oz (3.679 kg) F Vag-Spont   Y      Social History   Social History  . Marital Status: Married    Spouse Name: N/A  . Number of Children: N/A  . Years of Education: N/A   Social History Main Topics  . Smoking status: Former Smoker -- 1.00 packs/day for 10 years    Types: Cigarettes    Quit date: 09/25/2003  . Smokeless tobacco: Never Used  . Alcohol Use: 0.0 oz/week    0 Standard drinks or equivalent per week     Comment: pt states she is a social drinker  . Drug Use: No  . Sexual Activity: Yes    Birth Control/ Protection: Surgical   Other Topics Concern  . None   Social History Narrative    Family History  Problem Relation Age of Onset  . Bipolar disorder Mother   . Anxiety disorder Mother   . Cancer Mother     brain  . Glaucoma Mother   . Heart disease Father   . Diabetes Father   . Cancer Father   .  Bipolar disorder Sister   . Schizophrenia Sister   . Cancer Maternal Grandmother     lung  . Heart disease Maternal Grandfather   . Breast cancer Brother   . Ovarian cancer Neg Hx      (Not in a hospital admission)  Allergies  Allergen Reactions  . Codeine Diarrhea and Nausea And Vomiting    Review of Systems Constitutional: No recent fever/chills/sweats Respiratory: No recent cough/bronchitis Cardiovascular: No chest pain Gastrointestinal: No recent nausea/vomiting/diarrhea Genitourinary: No UTI symptoms Hematologic/lymphatic:No history of coagulopathy or recent blood thinner use    Objective:    BP 117/79 mmHg  Pulse 79  Ht  (1.753 m)  Wt 260 lb 2 oz (117.992 kg)  BMI 38.40 kg/m2  LMP 06/01/2015  General:   Normal, obese  Skin:   normal  HEENT:  Normal  Neck:  Supple without Adenopathy or Thyromegaly  Lungs:   Heart:              Breasts:   Abdomen:  Pelvis:  M/S   Extremeties:  Neuro:    clear to auscultation bilaterally   Normal without murmur   Not Examined   soft, mildly tender in  midline; bowel sounds normal; no masses,  no organomegaly   Exam deferred to OR  No CVAT  Warm/Dry  Normal           Imaging (Pelvic Ultrasound 06/10/2015):  Indications:Pelvic pain with IUD  Findings:  The uterus measures 10.5 x 5.0 x 6.0 cm.  Echo texture is homogenous without evidence of focal masses.  The Endometrium measures 3.6 mm. There is an IUD situated in the midline towards the fundus.  Right Ovary measures 3.5 x 3.1 x 3.5 cm. It is normal in appearance.  Left Ovary measures 1.7 x 1.7 x 2.4 cm. It is normal appearance.  Survey of the adnexa demonstrates no adnexal masses.  There is no free fluid in the cul de sac.  Impression:  1. IUD appears to be within the endometrium at the proper level.  Recommendations:  1.Clinical correlation with the patient's History and Physical Exam.   Assessment:    Pelvic pain Menorrhagia   Plan:    Counseling: Procedure, risks, reasons, benefits and complications (including injury to bowel, bladder, major blood vessel, ureter, bleeding, possibility of transfusion, infection, or fistula formation) reviewed in detail. Consent signed. Preop testing ordered. Instructions reviewed, including NPO after midnight. Will remove IUD at time of procedure.    Hildred Laser, MD [facility]

## 2015-06-16 ENCOUNTER — Encounter
Admission: RE | Admit: 2015-06-16 | Discharge: 2015-06-16 | Disposition: A | Discharge status: Home / Self Care | Payer: Managed Care, Other (non HMO) | Source: Ambulatory Visit | Attending: Obstetrics and Gynecology | Admitting: Obstetrics and Gynecology

## 2015-06-16 DIAGNOSIS — Z01812 Encounter for preprocedural laboratory examination: Secondary | ICD-10-CM | POA: Diagnosis present

## 2015-06-16 LAB — CBC
HEMATOCRIT: 36.6 % (ref 35.0–47.0)
Hemoglobin: 11.7 g/dL — ABNORMAL LOW (ref 12.0–16.0)
MCH: 26.9 pg (ref 26.0–34.0)
MCHC: 32 g/dL (ref 32.0–36.0)
MCV: 84.2 fL (ref 80.0–100.0)
PLATELETS: 269 10*3/uL (ref 150–440)
RBC: 4.35 MIL/uL (ref 3.80–5.20)
RDW: 14.3 % (ref 11.5–14.5)
WBC: 14.8 10*3/uL — ABNORMAL HIGH (ref 3.6–11.0)

## 2015-06-16 LAB — RAPID HIV SCREEN (HIV 1/2 AB+AG)
HIV 1/2 Antibodies: NONREACTIVE
HIV-1 P24 Antigen - HIV24: NONREACTIVE

## 2015-06-16 LAB — SURGICAL PCR SCREEN
MRSA, PCR: NEGATIVE
Staphylococcus aureus: NEGATIVE

## 2015-06-16 LAB — ABO/RH: ABO/RH(D): O POS

## 2015-06-16 NOTE — Patient Instructions (Signed)
  Your procedure is scheduled on: Monday Sept. 26, 2016. Report to Same Day Surgery. To find out your arrival time please call [phone] between 1PM - 3PM on Friday Sept. 23, 2016.  Remember: Instructions that are not followed completely may result in serious medical risk, up to and including death, or upon the discretion of your surgeon and anesthesiologist your surgery may need to be rescheduled.    _x___ 1. Do not eat food or drink liquids after midnight. No gum chewing or hard candies.     _x___ 2. No Alcohol for 24 hours before or after surgery.   ____ 3. Bring all medications with you on the day of surgery if instructed.    _x___ 4. Notify your doctor if there is any change in your medical condition     (cold, fever, infections).     Do not wear jewelry, make-up, hairpins, clips or nail polish.  Do not wear lotions, powders, or perfumes. You may wear deodorant.  Do not shave 48 hours prior to surgery. Men may shave face and neck.  Do not bring valuables to the hospital.    [facility] is not responsible for any belongings or valuables.               Contacts, dentures or bridgework may not be worn into surgery.  Leave your suitcase in the car. After surgery it may be brought to your room.  For patients admitted to the hospital, discharge time is determined by your treatment team.   Patients discharged the day of surgery will not be allowed to drive home.    Please read over the following fact sheets that you were given:   [facility] Preparing for Surgery  __x__ Take these medicines the morning of surgery with A SIP OF WATER:    1. buPROPion (WELLBUTRIN XL)     ____ Fleet Enema (as directed)   _x___ Use CHG Soap as directed  ____ Use inhalers on the day of surgery  ____ Stop metformin 2 days prior to surgery    ____ Take 1/2 of usual insulin dose the night before surgery and none on the morning of surgery.   ____ Stop Coumadin/Plavix/aspirin on does not  apply.  _x___ Stop Anti-inflammatories starting now.May take Tylenol, Tramadol or Hydrocodone for pain.   ____ Stop supplements until after surgery.    _x___ Bring C-Pap to the hospital.

## 2015-06-16 NOTE — Pre-Procedure Instructions (Signed)
Faxed request for H+P sent to Dr. Oretha Milch office.

## 2015-06-17 LAB — RPR: RPR: NONREACTIVE

## 2015-06-17 LAB — HEPATITIS B SURFACE ANTIGEN: HEP B S AG: NEGATIVE

## 2015-06-17 NOTE — OR Nursing (Signed)
Dr Valentino Saxon notified of WBC 14.8. No further orders given.

## 2015-06-20 ENCOUNTER — Ambulatory Visit: Payer: Managed Care, Other (non HMO) | Admitting: Certified Registered Nurse Anesthetist

## 2015-06-20 ENCOUNTER — Observation Stay
Admission: RE | Admit: 2015-06-20 | Discharge: 2015-06-22 | Disposition: A | Discharge status: Home / Self Care | Payer: Managed Care, Other (non HMO) | Source: Ambulatory Visit | Attending: Obstetrics and Gynecology | Admitting: Obstetrics and Gynecology

## 2015-06-20 ENCOUNTER — Encounter
Admission: RE | Disposition: A | Discharge status: Home / Self Care | Payer: Self-pay | Source: Ambulatory Visit | Attending: Obstetrics and Gynecology

## 2015-06-20 DIAGNOSIS — F449 Dissociative and conversion disorder, unspecified: Secondary | ICD-10-CM | POA: Insufficient documentation

## 2015-06-20 DIAGNOSIS — J45909 Unspecified asthma, uncomplicated: Secondary | ICD-10-CM | POA: Diagnosis not present

## 2015-06-20 DIAGNOSIS — R102 Pelvic and perineal pain: Secondary | ICD-10-CM | POA: Diagnosis not present

## 2015-06-20 DIAGNOSIS — E669 Obesity, unspecified: Secondary | ICD-10-CM | POA: Insufficient documentation

## 2015-06-20 DIAGNOSIS — N939 Abnormal uterine and vaginal bleeding, unspecified: Secondary | ICD-10-CM | POA: Diagnosis present

## 2015-06-20 DIAGNOSIS — E538 Deficiency of other specified B group vitamins: Secondary | ICD-10-CM | POA: Diagnosis not present

## 2015-06-20 DIAGNOSIS — Z801 Family history of malignant neoplasm of trachea, bronchus and lung: Secondary | ICD-10-CM | POA: Insufficient documentation

## 2015-06-20 DIAGNOSIS — Z30432 Encounter for removal of intrauterine contraceptive device: Secondary | ICD-10-CM | POA: Diagnosis not present

## 2015-06-20 DIAGNOSIS — D649 Anemia, unspecified: Secondary | ICD-10-CM | POA: Diagnosis not present

## 2015-06-20 DIAGNOSIS — Z975 Presence of (intrauterine) contraceptive device: Secondary | ICD-10-CM | POA: Diagnosis not present

## 2015-06-20 DIAGNOSIS — E559 Vitamin D deficiency, unspecified: Secondary | ICD-10-CM | POA: Insufficient documentation

## 2015-06-20 DIAGNOSIS — Z885 Allergy status to narcotic agent status: Secondary | ICD-10-CM | POA: Insufficient documentation

## 2015-06-20 DIAGNOSIS — Z90710 Acquired absence of both cervix and uterus: Secondary | ICD-10-CM | POA: Diagnosis present

## 2015-06-20 DIAGNOSIS — N8329 Other ovarian cysts: Secondary | ICD-10-CM | POA: Diagnosis not present

## 2015-06-20 DIAGNOSIS — Z820 Family history of epilepsy and other diseases of the nervous system: Secondary | ICD-10-CM | POA: Diagnosis not present

## 2015-06-20 DIAGNOSIS — F319 Bipolar disorder, unspecified: Secondary | ICD-10-CM | POA: Insufficient documentation

## 2015-06-20 DIAGNOSIS — Z6838 Body mass index (BMI) 38.0-38.9, adult: Secondary | ICD-10-CM | POA: Insufficient documentation

## 2015-06-20 DIAGNOSIS — Z87891 Personal history of nicotine dependence: Secondary | ICD-10-CM | POA: Insufficient documentation

## 2015-06-20 DIAGNOSIS — Z79899 Other long term (current) drug therapy: Secondary | ICD-10-CM | POA: Diagnosis not present

## 2015-06-20 DIAGNOSIS — Z808 Family history of malignant neoplasm of other organs or systems: Secondary | ICD-10-CM | POA: Diagnosis not present

## 2015-06-20 DIAGNOSIS — N920 Excessive and frequent menstruation with regular cycle: Secondary | ICD-10-CM | POA: Insufficient documentation

## 2015-06-20 DIAGNOSIS — Z803 Family history of malignant neoplasm of breast: Secondary | ICD-10-CM | POA: Insufficient documentation

## 2015-06-20 DIAGNOSIS — Z8249 Family history of ischemic heart disease and other diseases of the circulatory system: Secondary | ICD-10-CM | POA: Diagnosis not present

## 2015-06-20 DIAGNOSIS — G4730 Sleep apnea, unspecified: Secondary | ICD-10-CM | POA: Insufficient documentation

## 2015-06-20 DIAGNOSIS — G43909 Migraine, unspecified, not intractable, without status migrainosus: Secondary | ICD-10-CM | POA: Insufficient documentation

## 2015-06-20 DIAGNOSIS — F329 Major depressive disorder, single episode, unspecified: Secondary | ICD-10-CM | POA: Insufficient documentation

## 2015-06-20 DIAGNOSIS — Z833 Family history of diabetes mellitus: Secondary | ICD-10-CM | POA: Diagnosis not present

## 2015-06-20 DIAGNOSIS — N840 Polyp of corpus uteri: Secondary | ICD-10-CM | POA: Diagnosis not present

## 2015-06-20 DIAGNOSIS — Z818 Family history of other mental and behavioral disorders: Secondary | ICD-10-CM | POA: Diagnosis not present

## 2015-06-20 DIAGNOSIS — G4700 Insomnia, unspecified: Secondary | ICD-10-CM | POA: Insufficient documentation

## 2015-06-20 DIAGNOSIS — N72 Inflammatory disease of cervix uteri: Secondary | ICD-10-CM | POA: Diagnosis not present

## 2015-06-20 HISTORY — PX: LAPAROSCOPIC ASSISTED VAGINAL HYSTERECTOMY: SHX5398

## 2015-06-20 LAB — HEMOGLOBIN AND HEMATOCRIT, BLOOD
HCT: 28.9 % — ABNORMAL LOW (ref 35.0–47.0)
HCT: 28.8 % — ABNORMAL LOW (ref 35.0–47.0)
HEMOGLOBIN: 9.3 g/dL — AB (ref 12.0–16.0)
Hemoglobin: 9.2 g/dL — ABNORMAL LOW (ref 12.0–16.0)

## 2015-06-20 LAB — POCT PREGNANCY, URINE: PREG TEST UR: NEGATIVE

## 2015-06-20 SURGERY — HYSTERECTOMY, VAGINAL, LAPAROSCOPY-ASSISTED
Anesthesia: General | Wound class: Clean Contaminated

## 2015-06-20 MED ORDER — LACTATED RINGERS IV SOLN
INTRAVENOUS | Status: DC
  Administered 2015-06-20 (×2): via INTRAVENOUS

## 2015-06-20 MED ORDER — SUGAMMADEX SODIUM 200 MG/2ML IV SOLN
INTRAVENOUS | Status: DC | PRN
  Administered 2015-06-20: 235.8 mg via INTRAVENOUS

## 2015-06-20 MED ORDER — FENTANYL CITRATE (PF) 100 MCG/2ML IJ SOLN
25.0000 ug | INTRAMUSCULAR | Status: DC | PRN
  Administered 2015-06-20: 25 ug via INTRAVENOUS

## 2015-06-20 MED ORDER — MIDAZOLAM HCL 2 MG/2ML IJ SOLN
INTRAMUSCULAR | Status: DC | PRN
  Administered 2015-06-20: 2 mg via INTRAVENOUS

## 2015-06-20 MED ORDER — OXYCODONE-ACETAMINOPHEN 5-325 MG PO TABS: 1.0000 | ORAL_TABLET | ORAL | Status: DC | PRN

## 2015-06-20 MED ORDER — IBUPROFEN 600 MG PO TABS
600.0000 mg | ORAL_TABLET | Freq: Four times a day (QID) | ORAL | Status: DC | PRN
  Administered 2015-06-22 (×2): 600 mg via ORAL
  Filled 2015-06-20 (×2): qty 1

## 2015-06-20 MED ORDER — FAMOTIDINE 20 MG PO TABS
20.0000 mg | ORAL_TABLET | Freq: Once | ORAL | Status: AC
  Administered 2015-06-20: 20 mg via ORAL

## 2015-06-20 MED ORDER — ACETAMINOPHEN 10 MG/ML IV SOLN
INTRAVENOUS | Status: DC | PRN
  Administered 2015-06-20: 1000 mg via INTRAVENOUS

## 2015-06-20 MED ORDER — PROPOFOL 10 MG/ML IV BOLUS
INTRAVENOUS | Status: DC | PRN
  Administered 2015-06-20: 40 mg via INTRAVENOUS
  Administered 2015-06-20: 180 mg via INTRAVENOUS
  Administered 2015-06-20 (×2): 20 mg via INTRAVENOUS

## 2015-06-20 MED ORDER — VASOPRESSIN 20 UNIT/ML IV SOLN
INTRAVENOUS | Status: DC | PRN
  Administered 2015-06-20: 10 mL via INTRAMUSCULAR

## 2015-06-20 MED ORDER — OXYCODONE HCL 5 MG PO TABS: 5.0000 mg | ORAL_TABLET | Freq: Once | ORAL | Status: DC | PRN

## 2015-06-20 MED ORDER — ESTROGENS, CONJUGATED 0.625 MG/GM VA CREA
TOPICAL_CREAM | VAGINAL | Status: AC
  Filled 2015-06-20: qty 30

## 2015-06-20 MED ORDER — VASOPRESSIN 20 UNIT/ML IV SOLN
INTRAVENOUS | Status: AC
  Filled 2015-06-20: qty 1

## 2015-06-20 MED ORDER — BUPIVACAINE-EPINEPHRINE 0.5% -1:200000 IJ SOLN
INTRAMUSCULAR | Status: DC | PRN
  Administered 2015-06-20: 15 mL

## 2015-06-20 MED ORDER — DEXAMETHASONE SODIUM PHOSPHATE 4 MG/ML IJ SOLN
INTRAMUSCULAR | Status: DC | PRN
  Administered 2015-06-20: 10 mg via INTRAVENOUS

## 2015-06-20 MED ORDER — CEFAZOLIN SODIUM-DEXTROSE 2-3 GM-% IV SOLR
2.0000 g | INTRAVENOUS | Status: AC
  Administered 2015-06-20: 2 g via INTRAVENOUS

## 2015-06-20 MED ORDER — MENTHOL 3 MG MT LOZG
1.0000 | LOZENGE | OROMUCOSAL | Status: DC | PRN
  Filled 2015-06-20: qty 9

## 2015-06-20 MED ORDER — ROCURONIUM BROMIDE 100 MG/10ML IV SOLN
INTRAVENOUS | Status: DC | PRN
  Administered 2015-06-20: 5 mg via INTRAVENOUS
  Administered 2015-06-20: 10 mg via INTRAVENOUS
  Administered 2015-06-20: 40 mg via INTRAVENOUS
  Administered 2015-06-20: 5 mg via INTRAVENOUS
  Administered 2015-06-20: 10 mg via INTRAVENOUS

## 2015-06-20 MED ORDER — ACETAMINOPHEN 10 MG/ML IV SOLN
INTRAVENOUS | Status: AC
  Filled 2015-06-20: qty 100

## 2015-06-20 MED ORDER — FENTANYL CITRATE (PF) 100 MCG/2ML IJ SOLN
INTRAMUSCULAR | Status: AC
  Administered 2015-06-20: 25 ug via INTRAVENOUS
  Filled 2015-06-20: qty 2

## 2015-06-20 MED ORDER — FLUORESCEIN SODIUM 10 % IJ SOLN
INTRAMUSCULAR | Status: DC | PRN
  Administered 2015-06-20: .5 mL via INTRAVENOUS

## 2015-06-20 MED ORDER — FLUORESCEIN SODIUM 10 % IJ SOLN
INTRAMUSCULAR | Status: AC
  Filled 2015-06-20: qty 5

## 2015-06-20 MED ORDER — LIDOCAINE HCL (CARDIAC) 20 MG/ML IV SOLN
INTRAVENOUS | Status: DC | PRN
Start: 2015-06-20 — End: 2015-06-20
  Administered 2015-06-20: 100 mg via INTRAVENOUS

## 2015-06-20 MED ORDER — FAMOTIDINE 20 MG PO TABS
ORAL_TABLET | ORAL | Status: AC
Start: 2015-06-20 — End: 2015-06-20
  Administered 2015-06-20: 20 mg via ORAL
  Filled 2015-06-20: qty 1

## 2015-06-20 MED ORDER — PANTOPRAZOLE SODIUM 40 MG PO TBEC
40.0000 mg | DELAYED_RELEASE_TABLET | Freq: Every day | ORAL | Status: DC
  Administered 2015-06-21: 40 mg via ORAL
  Filled 2015-06-20: qty 1

## 2015-06-20 MED ORDER — CEFAZOLIN SODIUM-DEXTROSE 2-3 GM-% IV SOLR
INTRAVENOUS | Status: AC
Start: 2015-06-20 — End: 2015-06-20
  Administered 2015-06-20: 2 g via INTRAVENOUS
  Filled 2015-06-20: qty 50

## 2015-06-20 MED ORDER — SIMETHICONE 80 MG PO CHEW: 80.0000 mg | CHEWABLE_TABLET | Freq: Four times a day (QID) | ORAL | Status: DC | PRN

## 2015-06-20 MED ORDER — SODIUM CHLORIDE 0.9 % IV BOLUS (SEPSIS): 500.0000 mL | Freq: Once | INTRAVENOUS | Status: DC

## 2015-06-20 MED ORDER — BUPIVACAINE-EPINEPHRINE (PF) 0.5% -1:200000 IJ SOLN
INTRAMUSCULAR | Status: AC
  Filled 2015-06-20: qty 30

## 2015-06-20 MED ORDER — KETOROLAC TROMETHAMINE 30 MG/ML IJ SOLN
30.0000 mg | Freq: Four times a day (QID) | INTRAMUSCULAR | Status: AC | PRN
  Administered 2015-06-20 – 2015-06-21 (×2): 30 mg via INTRAVENOUS
  Filled 2015-06-20 (×2): qty 1

## 2015-06-20 MED ORDER — KETOROLAC TROMETHAMINE 30 MG/ML IJ SOLN: 30.0000 mg | Freq: Four times a day (QID) | INTRAMUSCULAR | Status: DC | PRN

## 2015-06-20 MED ORDER — FENTANYL CITRATE (PF) 100 MCG/2ML IJ SOLN
INTRAMUSCULAR | Status: DC | PRN
  Administered 2015-06-20 (×3): 50 ug via INTRAVENOUS
  Administered 2015-06-20: 100 ug via INTRAVENOUS
  Administered 2015-06-20 (×7): 50 ug via INTRAVENOUS

## 2015-06-20 MED ORDER — ONDANSETRON HCL 4 MG/2ML IJ SOLN
4.0000 mg | Freq: Four times a day (QID) | INTRAMUSCULAR | Status: DC | PRN
  Administered 2015-06-20 – 2015-06-21 (×2): 4 mg via INTRAVENOUS
  Filled 2015-06-20 (×2): qty 2

## 2015-06-20 MED ORDER — LACTATED RINGERS IV SOLN
INTRAVENOUS | Status: DC
  Administered 2015-06-20: 14:00:00 via INTRAVENOUS

## 2015-06-20 MED ORDER — HYDROMORPHONE HCL 1 MG/ML IJ SOLN
0.2000 mg | INTRAMUSCULAR | Status: DC | PRN
  Administered 2015-06-20: 0.6 mg via INTRAVENOUS
  Administered 2015-06-20: 0.5 mg via INTRAVENOUS
  Administered 2015-06-21 – 2015-06-22 (×3): 0.6 mg via INTRAVENOUS
  Filled 2015-06-20 (×5): qty 1

## 2015-06-20 MED ORDER — LACTATED RINGERS IV SOLN
INTRAVENOUS | Status: DC
  Administered 2015-06-20 – 2015-06-22 (×4): via INTRAVENOUS

## 2015-06-20 MED ORDER — LACTATED RINGERS IV BOLUS (SEPSIS)
500.0000 mL | Freq: Once | INTRAVENOUS | Status: AC
  Administered 2015-06-20: 500 mL via INTRAVENOUS

## 2015-06-20 MED ORDER — ONDANSETRON HCL 4 MG PO TABS: 4.0000 mg | ORAL_TABLET | Freq: Four times a day (QID) | ORAL | Status: DC | PRN

## 2015-06-20 MED ORDER — ONDANSETRON HCL 4 MG/2ML IJ SOLN
INTRAMUSCULAR | Status: DC | PRN
  Administered 2015-06-20 (×2): 4 mg via INTRAVENOUS

## 2015-06-20 MED ORDER — PHENYLEPHRINE HCL 10 MG/ML IJ SOLN
INTRAMUSCULAR | Status: DC | PRN
  Administered 2015-06-20: 200 ug via INTRAVENOUS
  Administered 2015-06-20: 100 ug via INTRAVENOUS

## 2015-06-20 MED ORDER — OXYCODONE HCL 5 MG/5ML PO SOLN: 5.0000 mg | Freq: Once | ORAL | Status: DC | PRN

## 2015-06-20 MED ORDER — CEFAZOLIN SODIUM 1-5 GM-% IV SOLN
INTRAVENOUS | Status: DC | PRN
  Administered 2015-06-20: 1 g via INTRAVENOUS

## 2015-06-20 MED ORDER — LACTATED RINGERS IR SOLN
Status: DC | PRN
  Administered 2015-06-20: 100 mL

## 2015-06-20 MED ORDER — SODIUM CHLORIDE 0.9 % IJ SOLN
INTRAMUSCULAR | Status: AC
  Filled 2015-06-20: qty 50

## 2015-06-20 SURGICAL SUPPLY — 74 items
APPLICATOR ARISTA FLEXITIP XL (MISCELLANEOUS) ×2 IMPLANT
BAG COUNTER SPONGE EZ (MISCELLANEOUS) IMPLANT
BAG URO DRAIN 2000ML W/SPOUT (MISCELLANEOUS) ×2 IMPLANT
BLADE SURG 15 STRL LF DISP TIS (BLADE) ×1 IMPLANT
BLADE SURG 15 STRL SS (BLADE) ×1
BLADE SURG SZ10 CARB STEEL (BLADE) ×2 IMPLANT
BLADE SURG SZ11 CARB STEEL (BLADE) ×2 IMPLANT
CANISTER SUCT 1200ML W/VALVE (MISCELLANEOUS) ×2 IMPLANT
CATH FOLEY 2WAY  5CC 16FR (CATHETERS) ×1
CATH URTH 16FR FL 2W BLN LF (CATHETERS) ×1 IMPLANT
CHLORAPREP W/TINT 26ML (MISCELLANEOUS) ×2 IMPLANT
CUP MEDICINE 2OZ PLAST GRAD ST (MISCELLANEOUS) ×2 IMPLANT
DISSECTOR KITTNER STICK (MISCELLANEOUS) ×1 IMPLANT
DISSECTORS/KITTNER STICK (MISCELLANEOUS) ×2
DRAPE PERI LITHO V/GYN (MISCELLANEOUS) ×2 IMPLANT
DRAPE SHEET LG 3/4 BI-LAMINATE (DRAPES) ×4 IMPLANT
ELECT CAUTERY BLADE 6.4 (BLADE) ×2 IMPLANT
GAUZE PACK 2X3YD (MISCELLANEOUS) IMPLANT
GAUZE SPONGE NON-WVN 2X2 STRL (MISCELLANEOUS) ×3 IMPLANT
GLOVE BIO SURGEON STRL SZ 6 (GLOVE) ×12 IMPLANT
GLOVE BIO SURGEON STRL SZ8 (GLOVE) ×2 IMPLANT
GLOVE BIOGEL PI IND STRL 6.5 (GLOVE) ×3 IMPLANT
GLOVE BIOGEL PI INDICATOR 6.5 (GLOVE) ×3
GLOVE INDICATOR 8.0 STRL GRN (GLOVE) IMPLANT
GOWN STRL REUS W/ TWL LRG LVL3 (GOWN DISPOSABLE) ×3 IMPLANT
GOWN STRL REUS W/TWL LRG LVL3 (GOWN DISPOSABLE) ×3
HANDLE YANKAUER SUCT BULB TIP (MISCELLANEOUS) ×2 IMPLANT
HEMOSTAT ARISTA ABSORB 1G (Miscellaneous) ×6 IMPLANT
HEMOSTAT ARISTA ABSORB 3G PWDR (MISCELLANEOUS) ×2 IMPLANT
IRRIGATION STRYKERFLOW (MISCELLANEOUS) ×2 IMPLANT
IRRIGATOR STRYKERFLOW (MISCELLANEOUS) ×4
IV LACTATED RINGERS 1000ML (IV SOLUTION) ×6 IMPLANT
KIT PINK PAD W/HEAD ARE REST (MISCELLANEOUS) ×2
KIT PINK PAD W/HEAD ARM REST (MISCELLANEOUS) ×1 IMPLANT
KIT RM TURNOVER CYSTO AR (KITS) ×2 IMPLANT
LABEL OR SOLS (LABEL) ×2 IMPLANT
LIGASURE BLUNT 5MM 37CM (INSTRUMENTS) ×2 IMPLANT
LIQUID BAND (GAUZE/BANDAGES/DRESSINGS) ×2 IMPLANT
NDL SAFETY ECLIPSE 18X1.5 (NEEDLE) ×1 IMPLANT
NEEDLE HYPO 18GX1.5 SHARP (NEEDLE) ×1
NEEDLE HYPO 25GX1 SAFETY (NEEDLE) ×2 IMPLANT
NEEDLE SPNL 22GX3.5 QUINCKE BK (NEEDLE) ×2 IMPLANT
NS IRRIG 500ML POUR BTL (IV SOLUTION) ×2 IMPLANT
OINTMENT BETADINE 1.5GM (MISCELLANEOUS) ×2 IMPLANT
PACK BASIN MINOR ARMC (MISCELLANEOUS) ×2 IMPLANT
PACK GYN LAPAROSCOPIC (MISCELLANEOUS) ×2 IMPLANT
PAD GROUND ADULT SPLIT (MISCELLANEOUS) ×2 IMPLANT
PAD OB MATERNITY 4.3X12.25 (PERSONAL CARE ITEMS) ×2 IMPLANT
PAD PREP 24X41 OB/GYN DISP (PERSONAL CARE ITEMS) ×2 IMPLANT
SCISSORS METZENBAUM CVD 33 (INSTRUMENTS) IMPLANT
SET CYSTO W/LG BORE CLAMP LF (SET/KITS/TRAYS/PACK) ×2 IMPLANT
SHEARS HARMONIC ACE PLUS 36CM (ENDOMECHANICALS) IMPLANT
SLEEVE ENDOPATH XCEL 5M (ENDOMECHANICALS) ×4 IMPLANT
SOL PREP PVP 2OZ (MISCELLANEOUS) ×2
SOLUTION PREP PVP 2OZ (MISCELLANEOUS) ×1 IMPLANT
SPONGE VERSALON 2X2 STRL (MISCELLANEOUS) ×3
SPONGE XRAY 4X4 16PLY STRL (MISCELLANEOUS) ×4 IMPLANT
STRIP CLOSURE SKIN 1/2X4 (GAUZE/BANDAGES/DRESSINGS) IMPLANT
SUT CHROMIC 0 CT 1 (SUTURE) ×2 IMPLANT
SUT CHROMIC 1-0 (SUTURE) IMPLANT
SUT CHROMIC 2 0 CT 1 (SUTURE) IMPLANT
SUT VIC AB 0 CT1 27 (SUTURE) ×3
SUT VIC AB 0 CT1 27XCR 8 STRN (SUTURE) ×3 IMPLANT
SUT VIC AB 0 CT1 36 (SUTURE) ×4 IMPLANT
SUT VIC AB 0 CT2 27 (SUTURE) IMPLANT
SUT VIC AB 2-0 CT1 (SUTURE) ×4 IMPLANT
SUT VIC AB 4-0 FS2 27 (SUTURE) ×2 IMPLANT
SUT VICRYL 0 AB UR-6 (SUTURE) ×2 IMPLANT
SYR 50ML LL SCALE MARK (SYRINGE) ×2 IMPLANT
SYR CONTROL 10ML (SYRINGE) ×2 IMPLANT
SYRINGE 10CC LL (SYRINGE) ×2 IMPLANT
TROCAR ENDO BLADELESS 11MM (ENDOMECHANICALS) ×2 IMPLANT
TROCAR XCEL NON-BLD 5MMX100MML (ENDOMECHANICALS) ×2 IMPLANT
TUBING INSUFFLATOR HEATED (MISCELLANEOUS) ×2 IMPLANT

## 2015-06-20 NOTE — Progress Notes (Signed)
Pt from OR with oral airway intact.

## 2015-06-20 NOTE — Transfer of Care (Signed)
Immediate Anesthesia Transfer of Care Note  Patient: Emily [name]  Procedure(s) Performed: Procedure(s): LAPAROSCOPIC ASSISTED VAGINAL HYSTERECTOMY, BILATERAL SALPINGECTOMY, CYSTOSCOPY, IUD REMOVAL  (N/A)  Patient Location: PACU  Anesthesia Type:General  Level of Consciousness: sedated  Airway & Oxygen Therapy: Patient Spontanous Breathing and Patient connected to face mask oxygen  Post-op Assessment: Report given to RN and Post -op Vital signs reviewed and stable  Post vital signs: Reviewed and stable  Last Vitals:  Filed Vitals:   06/20/15 1210  BP: 91/45  Pulse: 102  Temp: 37.1 C  Resp: 10    Complications: No apparent anesthesia complications

## 2015-06-20 NOTE — Anesthesia Preprocedure Evaluation (Signed)
Anesthesia Evaluation  Patient identified by MRN, date of birth, ID band Patient awake    Reviewed: Allergy & Precautions, H&P , NPO status , Patient's Chart, lab work & pertinent test results  History of Anesthesia Complications Negative for: history of anesthetic complications  Airway Mallampati: I  TM Distance: >3 FB Neck ROM: full    Dental no notable dental hx. (+) Teeth Intact   Pulmonary asthma , sleep apnea and Continuous Positive Airway Pressure Ventilation , former smoker,    Pulmonary exam normal breath sounds clear to auscultation       Cardiovascular Exercise Tolerance: Good (-) Past MI negative cardio ROS Normal cardiovascular exam Rhythm:regular Rate:Normal     Neuro/Psych  Headaches, PSYCHIATRIC DISORDERS Depression Bipolar Disorder    GI/Hepatic negative GI ROS, Neg liver ROS, neg GERD  ,  Endo/Other  negative endocrine ROS  Renal/GU negative Renal ROS  negative genitourinary   Musculoskeletal   Abdominal   Peds  Hematology negative hematology ROS (+)   Anesthesia Other Findings Past Medical History:   Conversion disorder                                          Insomnia                                                     Bipolar 1 disorder                                           Vitamin B 12 deficiency                                      Vitamin D deficiency                                         Depression                                                   Migraines                                                    Sleep apnea                                                  Ovarian cyst  Comment:left 2.5cm   Anemia                                                       Asthma                                                         Comment:sports induced-no inhalers   MRSA infection                                  2010         Reproductive/Obstetrics negative OB ROS                             Anesthesia Physical Anesthesia Plan  ASA: III  Anesthesia Plan: General ETT   Post-op Pain Management:    Induction:   Airway Management Planned:   Additional Equipment:   Intra-op Plan:   Post-operative Plan:   Informed Consent: I have reviewed the patients History and Physical, chart, labs and discussed the procedure including the risks, benefits and alternatives for the proposed anesthesia with the patient or authorized representative who has indicated his/her understanding and acceptance.   Dental Advisory Given  Plan Discussed with: Anesthesiologist, CRNA and Surgeon  Anesthesia Plan Comments:         Anesthesia Quick Evaluation

## 2015-06-20 NOTE — H&P (Signed)
UPDATE TO PREVIOUS HISTORY AND PHYSICAL  The patient has been seen and examined.  H&P is up to date, no changes noted.  Patient is a 34 y.o. G52P3003 female scheduled for TVH vs LAVH with bilateral salpingectomy. Indications for procedure are pelvic pain, menorrhagia, despite IUD placement. Patient can proceed to the OR for scheduled procedure.   Hildred Laser, MD 06/20/2015 7:13 AM

## 2015-06-20 NOTE — Op Note (Addendum)
Laparoscopic-Assisted Vaginal Hysterectomy Procedure Note  Indications:   Emily [name]  is a 34 y.o. [phone] with a history of abnormal uterine bleeding (menorrhagia) and pelvic pain, desiring definitive surgical management.    Pre-operative Diagnosis: pelvic pain, menorrhagia, obesity, Mirena IUD in place   Post-operative Diagnosis: Same with Mirena IUD removal, hemorrhage, large left ovarian simple cyst  Surgeon: Hildred Laser, MD  Assistants: None  Procedure: Mirena IUD removal, Laparoscopic-assisted vaginal hysterectomy, bilateral salpingectomy  Anesthesia: General anesthesia  ASA Class: III  Findings: Normal appearing upper abdomen.  Uterus sounded to 8 cm.  Mirena IUD strings visible from cervical os.  Left ovary with 3 cm simple cyst, otherwise normal.  Normal right ovary.  Fallopian tubes previously surgically interrupted, left side with Filsche clip still attached. No clip noted on right.  Cystoscopy normal with bilateral efflux from ureteric orifices.   Procedure Details: The patient was seen in the Holding Room. The risks, benefits, complications, treatment options, and expected outcomes were discussed with the patient.  The patient concurred with the proposed plan, giving informed consent.  The site of surgery properly noted. The patient received intravenous antibiotics and had sequential compression devices applied to her lower extremities while in the preoperative area.She was taken to the Operating Room, identified as Emily [name] and the procedure verified as Laparoscopic assisted vaginal hysterectomy, bilateral salpingectomy.   General anesthesia was administered and was found to be adequate.  She was placed in the dorsal lithotomy position, and was prepped and draped in a sterile manner.  A Time Out was held and the above information confirmed.  A Foley catheter was inserted into her bladder, and attached to constant drainage.  A speculum was inserted into the patient's  vagina and the anterior lip of the cervix was grasped using a single-tooth tenaculum.  The IUD strings were visualized from the cervical os and a Kelly clamp was used to grasp the strings and remove the IUD.   A Hulka clamp was then advanced into the uterus for uterine manipulation.  The speculum and single tooth tenaculum were removed from the patient's vagina.   Attention was turned to the abdomen where an umbilical incision was made with the scalpel.  The Optiview 5-mm trocar and sleeve were then advanced without difficulty with the laparoscope under direct visualization into the abdomen.  The abdomen was then insufflated with carbon dioxide gas and adequate pneumoperitoneum was obtained.  Bilateral 5-mm lower quadrant ports were then placed under direct visualization.  A survey of the patient's pelvis and abdomen revealed the findings as above.  Attention was turned to the right fallopian tube.  The tube ligament on the patient's right side, was clamped using the Ligasure scalpel and ligated.  The mesosalpinx was then transected across until the fallopian tube was freed.  The tube was removed through the port site.  The tubo-ovarian ligament and the round ligaments were then transected and ligated using the Harmonic scalpel.  There was noted to be slight oozing from the site, and was coagulated using the Ligasure device.  An OG tube was placed to decompress the bowel as this was leading to difficulty in maintaining adequate visualization of the posterior cul-de-sac. Due to difficulty in elevating the uterus, also leading to decreased visualization, attention was turned to the left adnexa where a similar procedure was carried out.  The left fallopian tube was the removed through the trochar site. There was a 3 cm simple cyst on the left ovary that was  incised and allowed to drain with clear fluid noted.  Again, due to decreased visualization of the posterior aspect of the uterus (decreased mobility of the  uterus as well as large bowel obstructing view despite decompression with OG tube), the decision was made to leave the trocars in place and proceed with completing the hysterectomy via the vaginal route .  Attention was then turned to her pelvis.  A weighted speculum was then placed in the vagina, and the anterior and posterior lips of the cervix were grasped bilaterally with tenaculums.  The cervix was then injected circumferentially with 10 cc of a Vasopressin solution to maintain hemostasis.  The cervix was then circumferentially incised, and the posterior cul-de-sac was entered sharply without difficulty.  A long weighted speculum was inserted into the posterior cul-de-sac. The Heaney clamp was then used to clamp the uterosacral ligaments on either side.  They were then cut and sutured ligated with 0 Vicryl, and were held with a tag for later identification. Of note, all sutures used in this case were 0 Vicryl unless otherwise noted.   The cardinal ligaments were then clamped, cut and ligated bilaterally.  At this point, full entry into the anterior cul-de-sac was made, no injury to the bladder was noted.   The uterine vessels were then clamped, cut, and suture ligated bilaterally.  The uterus was freed from all ligaments and was then delivered and sent to pathology.  There was noted to be bleeding at one of the left pedicles.  This was suture ligated with a 0-Vicryl.  After re-examination, it was noted that there was still an undetermined site of bleeding beyond the vaginal cuff at a pedicle, however this could not be reached using the vaginal approach.  The vaginal cuff was then closed with figure-of-eight sutures using with 0 Vicryl with care given to incorporate the uterosacral pedicles bilaterally.  All instruments were then removed from the pelvis.   Attention was then returned to her abdomen which was insufflated again with carbon dioxide gas.  The laparoscope was used to survey the operative site,  and there was a large accumulation of clots and blood.  A suction irrigator was used to remove the blood and irrigate the pelvis. The right 5 mm port site had to be converted to a 10 mm port site to allow for a larger suction-irrigator to be inserted. The source of the bleeding was identified on the right pelvic sidewall just distal to the infundibulopelvic ligament,  which was controlled using coagulation.  Arista was was then placed over the operative site to help with hemostasis.   No intraoperative injury to other surrounding organs was noted.   Cystoscopy was then performed which showed bilateral ureteral jets after intravenous fluorescein dye administration.   The abdomen was desufflated and all instruments were then removed from the patient's abdomen.   The fascia of the right 10 mm incision was closed using a 0-Vicryl.  All skin incisions were closed using a figure-of-eight stitch of 3-0 Vicryl and with Dermabond.  All incisions were injected with a total of 14 cc of Sensorcaine with epinephrine prior to trochar insertion. The patient tolerated the procedures well.  All instruments, needles, and sponge counts were correct x 2. The patient was taken to the recovery room awake, extubated and in stable condition.   Estimated Blood Loss:  1300 ml      Drains: foley catheter to gravity drainage, 600 clear urine at end of the procedure  Total IV Fluids:  2200 ml  Specimens: Uterus with cervix, bilateral fallopian tubes         Implants: None         Complications:  Excessive surgical blood loss (hemorrhage); otherwise patient tolerated the procedure well.         Disposition: PACU - hemodynamically stable.         Condition: stable   Hildred Laser, MD [facility]

## 2015-06-20 NOTE — Progress Notes (Signed)
Pt awakened oral airway removed intact. 

## 2015-06-20 NOTE — Anesthesia Postprocedure Evaluation (Signed)
  Anesthesia Post-op Note  Patient: Emily [name]  Procedure(s) Performed: Procedure(s): LAPAROSCOPIC ASSISTED VAGINAL HYSTERECTOMY, BILATERAL SALPINGECTOMY, CYSTOSCOPY, IUD REMOVAL  (N/A)  Anesthesia type:General ETT  Patient location: PACU  Post pain: Pain level controlled  Post assessment: Post-op Vital signs reviewed, Patient's Cardiovascular Status Stable, Respiratory Function Stable, Patent Airway and No signs of Nausea or vomiting  Post vital signs: Reviewed and stable  Last Vitals:  Filed Vitals:   06/20/15 1948  BP: 121/72  Pulse: 91  Temp: 37.2 C  Resp: 18    Level of consciousness: awake, alert  and patient cooperative  Complications: No apparent anesthesia complications

## 2015-06-21 DIAGNOSIS — N72 Inflammatory disease of cervix uteri: Secondary | ICD-10-CM | POA: Diagnosis not present

## 2015-06-21 LAB — CBC
HCT: 23.3 % — ABNORMAL LOW (ref 35.0–47.0)
Hemoglobin: 7.6 g/dL — ABNORMAL LOW (ref 12.0–16.0)
MCH: 27.7 pg (ref 26.0–34.0)
MCHC: 32.8 g/dL (ref 32.0–36.0)
MCV: 84.4 fL (ref 80.0–100.0)
PLATELETS: 253 10*3/uL (ref 150–440)
RBC: 2.76 MIL/uL — AB (ref 3.80–5.20)
RDW: 14.6 % — ABNORMAL HIGH (ref 11.5–14.5)
WBC: 13.3 10*3/uL — AB (ref 3.6–11.0)

## 2015-06-21 LAB — CREATININE, SERUM
CREATININE: 0.87 mg/dL (ref 0.44–1.00)
GFR calc non Af Amer: >60 mL/min (ref >60)

## 2015-06-21 LAB — SURGICAL PATHOLOGY

## 2015-06-21 LAB — PREPARE RBC (CROSSMATCH)

## 2015-06-21 MED ORDER — FERROUS SULFATE 325 (65 FE) MG PO TABS: 325.0000 mg | ORAL_TABLET | Freq: Three times a day (TID) | ORAL | Status: DC

## 2015-06-21 MED ORDER — SODIUM CHLORIDE 0.9 % IV SOLN
Freq: Once | INTRAVENOUS | Status: AC
  Administered 2015-06-21: 14:00:00 via INTRAVENOUS

## 2015-06-21 MED ORDER — IBUPROFEN 800 MG PO TABS: 800.0000 mg | ORAL_TABLET | Freq: Three times a day (TID) | ORAL | Status: DC | PRN

## 2015-06-21 MED ORDER — INFLUENZA VAC SPLIT QUAD 0.5 ML IM SUSY: 0.5000 mL | PREFILLED_SYRINGE | INTRAMUSCULAR | Status: DC

## 2015-06-21 MED ORDER — SODIUM CHLORIDE 0.9 % IJ SOLN: 3.0000 mL | INTRAMUSCULAR | Status: DC | PRN

## 2015-06-21 MED ORDER — OXYCODONE-ACETAMINOPHEN 5-325 MG PO TABS: 1.0000 | ORAL_TABLET | Freq: Four times a day (QID) | ORAL | Status: DC | PRN

## 2015-06-21 MED ORDER — DIPHENHYDRAMINE HCL 25 MG PO CAPS
25.0000 mg | ORAL_CAPSULE | Freq: Once | ORAL | Status: AC
  Administered 2015-06-21: 25 mg via ORAL
  Filled 2015-06-21: qty 1

## 2015-06-21 MED ORDER — SIMETHICONE 80 MG PO CHEW: 80.0000 mg | CHEWABLE_TABLET | Freq: Four times a day (QID) | ORAL | Status: DC | PRN

## 2015-06-21 MED ORDER — ACETAMINOPHEN 325 MG PO TABS
650.0000 mg | ORAL_TABLET | Freq: Once | ORAL | Status: AC
  Administered 2015-06-21: 650 mg via ORAL
  Filled 2015-06-21: qty 2

## 2015-06-21 MED ORDER — DOCUSATE SODIUM 100 MG PO CAPS: 100.0000 mg | ORAL_CAPSULE | Freq: Two times a day (BID) | ORAL | Status: DC

## 2015-06-21 NOTE — Discharge Instructions (Signed)
General Gynecological Post-Operative Instructions °You may expect to feel dizzy, weak, and drowsy for as long as 24 hours after receiving the medicine that made you sleep (anesthetic).  °Do not drive a car, ride a bicycle, participate in physical activities, or take public transportation until you are done taking narcotic pain medicines or as directed by your doctor.  °Do not drink alcohol or take tranquilizers.  °Do not take medicine that has not been prescribed by your doctor.  °Do not sign important papers or make important decisions while on narcotic pain medicines.  °Have a responsible person with you.  °CARE OF INCISION  °Keep incision clean and dry. °Take showers instead of baths until your doctor gives you permission to take baths.  °Avoid heavy lifting (more than 10 pounds/4.5 kilograms), pushing, or pulling.  °Avoid activities that may risk injury to your surgical site.  °No sexual intercourse or placement of anything in the vagina for 6 weeks or as instructed by your doctor. °If you have tubes coming from the wound site, check with your doctor regarding appropriate care of the tubes. °Only take prescription or over-the-counter medicines  for pain, discomfort, or fever as directed by your doctor. Do not take aspirin. It can make you bleed. Take medicines (antibiotics) that kill germs if they are prescribed for you.  °Call the office or go to the MAU if:  °You feel sick to your stomach (nauseous).  °You start to throw up (vomit).  °You have trouble eating or drinking.  °You have an oral temperature above 101.  °You have constipation that is not helped by adjusting diet or increasing fluid intake. Pain medicines are a common cause of constipation.  °You have any other concerns. °SEEK IMMEDIATE MEDICAL CARE IF:  °You have persistent dizziness.  °You have difficulty breathing or a congested sounding (croupy) cough.  °You have an oral temperature above 102.5, not controlled by medicine.  °There is increasing  pain or tenderness near or in the surgical site.  ° °

## 2015-06-21 NOTE — Progress Notes (Signed)
rechecked

## 2015-06-21 NOTE — Progress Notes (Signed)
1 Day Post-Op Procedure(s) (LRB): LAPAROSCOPIC ASSISTED VAGINAL HYSTERECTOMY, BILATERAL SALPINGECTOMY, CYSTOSCOPY, IUD REMOVAL  (N/A)  Subjective: Patient reports dizziness, lightheadedness, incisional pain and tolerating PO.  Has not voided yet since catheter removal and has not ambulated yet.   Objective: I have reviewed patient's vital signs, intake and output, medications and labs. Temp:  [97.5 F (36.4 C)-99 F (37.2 C)] 98 F (36.7 C) (09/27 1133) Pulse Rate:  [81-114] 86 (09/27 1133) Resp:  [10-20] 16 (09/27 1133) BP: (57-125)/(34-75) 92/53 mmHg (09/27 1133) SpO2:  [92 %-100 %] 97 % (09/27 1133)  General: alert and no distress Resp: clear to auscultation bilaterally Cardio: regular rate and rhythm, S1, S2 normal, no murmur, click, rub or gallop GI: normal findings: bowel sounds normal and soft., abnormal findings:  appropriate incisional tenderness in the lower abdomen and incision: clean, dry, intact and with bandage Extremities: extremities normal, atraumatic, no cyanosis or edema, Homans sign is negative, no sign of DVT, no edema, redness or tenderness in the calves or thighs and SCDs in place Vaginal Bleeding: minimal   Labs:  Results for Emily, [name] (MRN [id number]) as of 06/21/2015 10:10  Ref. Range 06/16/2015 14:23 06/20/2015 12:39 06/20/2015 18:08 06/21/2015 06:27  WBC Latest Ref Range: 3.6-11.0 K/uL 14.8 (H)   13.3 (H)  RBC Latest Ref Range: 3.80-5.20 MIL/uL 4.35   2.76 (L)  Hemoglobin Latest Ref Range: 12.0-16.0 g/dL 40.9 (L) 9.3 (L) 9.2 (L) 7.6 (L)  HCT Latest Ref Range: 35.0-47.0 % 36.6 28.9 (L) 28.8 (L) 23.3 (L)  Platelets Latest Ref Range: 150-440 K/uL 269   253   Lab Results  Component Value Date   CREATININE 0.87 06/21/2015     Assessment: s/p Procedure(s): LAPAROSCOPIC ASSISTED VAGINAL HYSTERECTOMY, BILATERAL SALPINGECTOMY, CYSTOSCOPY, IUD REMOVAL  (N/A): stable, tolerating diet and anemia (symptomatic)  Plan: Advance diet Encourage  ambulation Advance to PO medication Discontinue IV fluids, when tolerating regular diet. Will continue to monitor symptoms of anemia (patient unsure if dizziness is from anemia or from recently receiving narcotic pain medicaitons).  Will allow for effects of meds to wear off, and if still symptomatic, will consider blood transfusion (2 units).       Hildred Laser 06/21/2015, 10:10 AM

## 2015-06-21 NOTE — Discharge Summary (Addendum)
Gynecology Physician Postoperative Discharge Summary  Patient ID: Emily [name] MRN: [id number] DOB/AGE: [birth year] 34 y.o.  Admit Date: 06/20/2015 Discharge Date: 06/21/2015  Preoperative Diagnoses: Abnormal uterine bleeding, pelvic pain, Mirena IUD in place  Procedures: Procedure(s) (LRB): LAPAROSCOPIC ASSISTED VAGINAL HYSTERECTOMY, BILATERAL SALPINGECTOMY, CYSTOSCOPY, IUD REMOVAL  (N/A)  Significant Labs: CBC Latest Ref Rng 06/22/2015 06/22/2015 06/21/2015  WBC 3.6 - 11.0 K/uL 9.4 - 13.3(H)  Hemoglobin 12.0 - 16.0 g/dL 8.0(L) 8.0(L) 7.6(L)  Hematocrit 35.0 - 47.0 % 23.5(L) 24.5(L) 23.3(L)  Platelets 150 - 440 K/uL 187 - 253    Hospital Course:  Emily [name] is a 34 y.o. [phone] admitted for scheduled surgery.  She underwent the procedures as mentioned above, her operation was complicated by intraoperative hemorrhage (EBL 1300 ml). For further details about surgery, please refer to the operative report. Patient's postoperative course was complicated by symptomatic anemia. Patient received a blood transfusion of 2 units PRBCs, with improvement of symptoms. By time of discharge on POD#2, her pain was controlled on oral pain medications; she was ambulating, voiding without difficulty, tolerating regular diet and passing flatus. She was deemed stable for discharge to home.   Discharge Exam: Blood pressure 106/59, pulse 88, temperature 97.8 F (36.6 C), temperature source Oral, resp. rate 18, height  (1.753 m), weight 260 lb (117.935 kg), last menstrual period 06/01/2015, SpO2 97 %. General appearance: alert and no distress  Resp: clear to auscultation bilaterally  Cardio: regular rate and rhythm  GI: soft, non-tender; bowel sounds normal; no masses, no organomegaly.  Incision: C/D/I, no erythema, no drainage noted Pelvic: scant blood on pad  Extremities: extremities normal, atraumatic, no cyanosis or edema and Homans sign is negative, no sign of DVT  Discharged Condition:  Stable  Disposition: 01-Home or Self Care     Medication List    STOP taking these medications        HYDROcodone-acetaminophen 5-325 MG per tablet  Commonly known as:  NORCO/VICODIN     ondansetron 4 MG tablet  Commonly known as:  ZOFRAN     traMADol 50 MG tablet  Commonly known as:  ULTRAM      TAKE these medications        amitriptyline 10 MG tablet  Commonly known as:  ELAVIL  Take 10 mg by mouth at bedtime.     buPROPion 300 MG 24 hr tablet  Commonly known as:  WELLBUTRIN XL  TAKE 1 TABLET DAILY.     docusate sodium 100 MG capsule  Commonly known as:  COLACE  Take 1 capsule (100 mg total) by mouth 2 (two) times daily.     ferrous sulfate 325 (65 FE) MG tablet  Take 1 tablet (325 mg total) by mouth 3 (three) times daily with meals.     fluticasone 50 MCG/ACT nasal spray  Commonly known as:  FLONASE  Place 2 sprays into both nostrils daily as needed for allergies or rhinitis.     ibuprofen 800 MG tablet  Commonly known as:  ADVIL,MOTRIN  Take 1 tablet (800 mg total) by mouth every 8 (eight) hours as needed.     oxyCODONE-acetaminophen 5-325 MG per tablet  Commonly known as:  PERCOCET/ROXICET  Take 1-2 tablets by mouth every 6 (six) hours as needed.     simethicone 80 MG chewable tablet  Commonly known as:  GAS-X  Chew 1 tablet (80 mg total) by mouth every 6 (six) hours as needed for flatulence.  Signed:  Hildred Laser, MD [facility]

## 2015-06-22 DIAGNOSIS — N72 Inflammatory disease of cervix uteri: Secondary | ICD-10-CM | POA: Diagnosis not present

## 2015-06-22 LAB — TYPE AND SCREEN
ABO/RH(D): O POS
Antibody Screen: NEGATIVE
UNIT DIVISION: 0
Unit division: 0

## 2015-06-22 LAB — CBC
HCT: 23.5 % — ABNORMAL LOW (ref 35.0–47.0)
HEMOGLOBIN: 8 g/dL — AB (ref 12.0–16.0)
MCH: 28.9 pg (ref 26.0–34.0)
MCHC: 33.8 g/dL (ref 32.0–36.0)
MCV: 85.3 fL (ref 80.0–100.0)
Platelets: 187 10*3/uL (ref 150–440)
RBC: 2.76 MIL/uL — AB (ref 3.80–5.20)
RDW: 14.8 % — ABNORMAL HIGH (ref 11.5–14.5)
WBC: 9.4 10*3/uL (ref 3.6–11.0)

## 2015-06-22 LAB — HEMOGLOBIN AND HEMATOCRIT, BLOOD
HCT: 24.5 % — ABNORMAL LOW (ref 35.0–47.0)
Hemoglobin: 8 g/dL — ABNORMAL LOW (ref 12.0–16.0)

## 2015-06-22 MED ORDER — AMMONIA AROMATIC IN INHA
RESPIRATORY_TRACT | Status: AC
  Filled 2015-06-22: qty 10

## 2015-06-22 NOTE — Progress Notes (Signed)
2 Days Post-Op Procedure(s) (LRB): LAPAROSCOPIC ASSISTED VAGINAL HYSTERECTOMY, BILATERAL SALPINGECTOMY, CYSTOSCOPY, IUD REMOVAL  (N/A)  Subjective: Patient reports resolution of dizziness, lightheadedness after blood transfusion.  Is ambulating, voiding without difficulty, and tolerating PO.    Objective: I have reviewed patient's vital signs, intake and output, medications and labs. Temp:  [97.9 F (36.6 C)-99.1 F (37.3 C)] 98 F (36.7 C) (09/28 0824) Pulse Rate:  [72-99] 72 (09/28 0824) Resp:  [16-20] 20 (09/28 0824) BP: (89-109)/(40-59) 100/49 mmHg (09/28 0824) SpO2:  [95 %-99 %] 95 % (09/28 0824)  General: alert and no distress Resp: clear to auscultation bilaterally Cardio: regular rate and rhythm, S1, S2 normal, no murmur, click, rub or gallop GI: normal findings: bowel sounds normal and soft., abnormal findings:  appropriate incisional tenderness in the lower abdomen and incision: clean, dry, intact and with bandage Extremities: extremities normal, atraumatic, no cyanosis or edema, Homans sign is negative, no sign of DVT, no edema, redness or tenderness in the calves or thighs and SCDs in place Vaginal Bleeding: minimal   Labs:  Results for JOSELY, MOFFAT (MRN [id number]) as of 06/21/2015 10:10  Ref. Range 06/16/2015 14:23 06/20/2015 12:39 06/20/2015 18:08 06/21/2015 06:27  WBC Latest Ref Range: 3.6-11.0 K/uL 14.8 (H)   13.3 (H)  RBC Latest Ref Range: 3.80-5.20 MIL/uL 4.35   2.76 (L)  Hemoglobin Latest Ref Range: 12.0-16.0 g/dL 82.9 (L) 9.3 (L) 9.2 (L) 7.6 (L)  HCT Latest Ref Range: 35.0-47.0 % 36.6 28.9 (L) 28.8 (L) 23.3 (L)  Platelets Latest Ref Range: 150-440 K/uL 269   253   Lab Results  Component Value Date   CREATININE 0.87 06/21/2015    Assessment: s/p Procedure(s): LAPAROSCOPIC ASSISTED VAGINAL HYSTERECTOMY, BILATERAL SALPINGECTOMY, CYSTOSCOPY, IUD REMOVAL  (N/A): stable, tolerating diet and anemia (now asymptomatic, s/p blood transfusion 2 units  PRBCs)  Plan: Advance diet Encourage ambulation Continue  PO medication Regular diet S/p blood transfusion for anemia, 2 units PRBCs.  Now asymptomatic (although increase not as high as anticipated). Will need to continue PO TID iron supplementation.  Can d/c home today. To f/u in office in 2 weeks for post-op check.      Hildred Laser 06/22/2015, 8:29 AM

## 2015-06-22 NOTE — Progress Notes (Signed)
All discharge instructions given to patient and spouse and they both voice understanding of all instructions given.  Iv d/c'd , patient tolerated well, they want to make their own f/u appt. Patient discharged home with spouse escorted out by auxillary in wheelchair.

## 2015-06-28 ENCOUNTER — Ambulatory Visit (INDEPENDENT_AMBULATORY_CARE_PROVIDER_SITE_OTHER): Payer: Managed Care, Other (non HMO) | Admitting: Obstetrics and Gynecology

## 2015-06-28 ENCOUNTER — Encounter: Payer: Self-pay | Admitting: Obstetrics and Gynecology

## 2015-06-28 VITALS — BP 129/84 | HR 79 | Temp 97.8°F | Resp 16 | Ht 69.0 in | Wt 256.6 lb

## 2015-06-28 DIAGNOSIS — Z9889 Other specified postprocedural states: Secondary | ICD-10-CM

## 2015-06-28 DIAGNOSIS — E669 Obesity, unspecified: Secondary | ICD-10-CM

## 2015-06-28 DIAGNOSIS — Z90710 Acquired absence of both cervix and uterus: Secondary | ICD-10-CM

## 2015-06-28 DIAGNOSIS — D6489 Other specified anemias: Secondary | ICD-10-CM

## 2015-06-28 NOTE — Progress Notes (Signed)
Subjective:     Emily [name] is a 34 y.o. female who presents to the clinic 1 weeks status post laparoscopic assisted vaginal hysterectomy and bilateral salpingectomy for abnormal uterine bleeding and pelvic pain. Surgery complicated by hemorhage, received 2 units PRBCs post-op for symptomatic anemia.  Taking iron twice daily. Eating a regular diet without difficulty. Bowel movements are normal. Pain is controlled with current analgesics. Medications being used: prescription NSAID's including ibuprofen (Motrin).  The following portions of the patient's history were reviewed and updated as appropriate: allergies, current medications, past family history, past medical history, past social history, past surgical history and problem list.  Review of Systems Pertinent items noted in HPI and remainder of comprehensive ROS otherwise negative.    Objective:    BP 129/84 mmHg  Pulse 79  Temp(Src) 97.8 F (36.6 C) (Oral)  Resp 16  Ht  (1.753 m)  Wt 256 lb 9.6 oz (116.393 kg)  BMI 37.88 kg/m2  LMP 06/01/2015 General:  alert and no distress  Abdomen: soft, bowel sounds active, non-tender, no abnormal masses, no hernias  Incision:   healing well, no drainage, no erythema, no hernia, no seroma, no swelling, no dehiscence, incision well approximated     Pathology 06/20/15:  DIAGNOSIS:  A. UTERUS WITH CERVIX AND BILATERAL FALLOPIAN TUBES; HYSTERECTOMY WITH  BILATERAL SALPINGECTOMY:  - ACUTE CERVICITIS OF THE CERVIX.  - BENIGN ENDOMETRIAL POLYP.  - BACKGROUND ENDOMETRIUM SHOWING SECRETORILY EXHAUSTED GLANDS WITH  DECIDUALIZED STROMA, CONSISTENT WITH PROGESTIN EFFECT.  - BILATERAL FALLOPIAN TUBES WITHOUT PATHOLOGIC CHANGES.  - NEGATIVE FOR HYPERPLASIA, DYSPLASIA AND CARCINOMA.   Assessment:    Doing well postoperatively.  Anemia   Plan:    1. Continue any current medications.  2. Wound care discussed.  3. Activity restrictions: no bending, stooping, or squatting, no gym class and no  lifting more than 15 pounds x 4 weeks 4. Anticipated return to work: not applicable. 5. Operative findings again reviewed. Pathology report discussed. 6. Follow up: 5 weeks for final post-op check.    Hildred Laser, MD [facility] 06/28/2015 2:04 PM

## 2015-06-30 ENCOUNTER — Encounter: Payer: Self-pay | Admitting: Family Medicine

## 2015-06-30 ENCOUNTER — Ambulatory Visit (INDEPENDENT_AMBULATORY_CARE_PROVIDER_SITE_OTHER): Payer: Managed Care, Other (non HMO) | Admitting: Family Medicine

## 2015-06-30 VITALS — BP 114/81 | HR 75 | Temp 98.1°F | Ht 67.8 in | Wt 254.0 lb

## 2015-06-30 DIAGNOSIS — F449 Dissociative and conversion disorder, unspecified: Secondary | ICD-10-CM

## 2015-06-30 DIAGNOSIS — Z23 Encounter for immunization: Secondary | ICD-10-CM

## 2015-06-30 DIAGNOSIS — G43909 Migraine, unspecified, not intractable, without status migrainosus: Secondary | ICD-10-CM | POA: Diagnosis not present

## 2015-06-30 DIAGNOSIS — F3170 Bipolar disorder, currently in remission, most recent episode unspecified: Secondary | ICD-10-CM | POA: Diagnosis not present

## 2015-06-30 DIAGNOSIS — G4730 Sleep apnea, unspecified: Secondary | ICD-10-CM | POA: Diagnosis not present

## 2015-06-30 DIAGNOSIS — Z0000 Encounter for general adult medical examination without abnormal findings: Secondary | ICD-10-CM

## 2015-06-30 LAB — MICROSCOPIC EXAMINATION: RENAL EPITHEL UA: NONE SEEN /HPF

## 2015-06-30 LAB — URINALYSIS, ROUTINE W REFLEX MICROSCOPIC
BILIRUBIN UA: NEGATIVE
GLUCOSE, UA: NEGATIVE
Ketones, UA: NEGATIVE
Nitrite, UA: NEGATIVE
PH UA: 6 (ref 5.0–7.5)
PROTEIN UA: NEGATIVE
Specific Gravity, UA: 1.01 (ref 1.005–1.030)
UUROB: 0.2 mg/dL (ref 0.2–1.0)

## 2015-06-30 MED ORDER — BUPROPION HCL ER (XL) 300 MG PO TB24: 300.0000 mg | ORAL_TABLET | Freq: Every day | ORAL | Status: DC

## 2015-06-30 MED ORDER — AMITRIPTYLINE HCL 10 MG PO TABS: 10.0000 mg | ORAL_TABLET | Freq: Every day | ORAL | Status: DC

## 2015-06-30 NOTE — Assessment & Plan Note (Signed)
Right arm stable with no changes

## 2015-06-30 NOTE — Assessment & Plan Note (Signed)
Currently controlled.

## 2015-06-30 NOTE — Assessment & Plan Note (Signed)
Patient has not been using recently but skin is started again tonight on her CPAP.

## 2015-06-30 NOTE — Progress Notes (Signed)
BP 114/81 mmHg  Pulse 75  Temp(Src) 98.1 F (36.7 C)  Ht 5' 7.8" (1.722 m)  Wt 254 lb (115.214 kg)  BMI 38.85 kg/m2  SpO2 99%  LMP 06/01/2015   Subjective:    Patient ID: Emily [name], female    DOB: 05-02-1981, 34 y.o.   MRN: [id number]  HPI: Emily [name] is a 34 y.o. female  Chief Complaint  Patient presents with  . Annual Exam   discussed patient recent history summarized here status post laparoscopic assisted vaginal hysterectomy and bilateral salpingectomy for abnormal uterine bleeding and pelvic pain. Surgery complicated by hemorhage, received 2 units PRBCs post-op for symptomatic anemia. Taking iron twice daily. Eating a regular diet without difficulty. Bowel movements are normal. Pain is controlled with current analgesics. Medications being used: prescription NSAID's including ibuprofen (Motrin). Patient recovering  Still taking bupropion for nerves amitriptyline to help with sleep Still having same symptoms with right hand. Relevant past medical, surgical, family and social history reviewed and updated as indicated. Interim medical history since our last visit reviewed. Allergies and medications reviewed and updated.  Review of Systems  Constitutional: Negative.   HENT: Negative.   Eyes: Negative.   Respiratory: Negative.   Cardiovascular: Negative.   Gastrointestinal: Negative.   Endocrine: Negative.   Genitourinary: Negative.   Musculoskeletal: Negative.   Skin: Negative.   Allergic/Immunologic: Negative.   Neurological: Negative.   Hematological: Negative.   Psychiatric/Behavioral: Negative.     Per HPI unless specifically indicated above     Objective:    BP 114/81 mmHg  Pulse 75  Temp(Src) 98.1 F (36.7 C)  Ht 5' 7.8" (1.722 m)  Wt 254 lb (115.214 kg)  BMI 38.85 kg/m2  SpO2 99%  LMP 06/01/2015  Wt Readings from Last 3 Encounters:  06/30/15 254 lb (115.214 kg)  06/28/15 256 lb 9.6 oz (116.393 kg)  06/20/15 260 lb (117.935 kg)     Physical Exam  Constitutional: She is oriented to person, place, and time. She appears well-developed and well-nourished.  HENT:  Head: Normocephalic and atraumatic.  Right Ear: External ear normal.  Left Ear: External ear normal.  Nose: Nose normal.  Mouth/Throat: Oropharynx is clear and moist.  Eyes: Conjunctivae and EOM are normal. Pupils are equal, round, and reactive to light.  Neck: Normal range of motion. Neck supple. Carotid bruit is not present.  Cardiovascular: Normal rate, regular rhythm and normal heart sounds.   No murmur heard. Pulmonary/Chest: Effort normal and breath sounds normal.  Abdominal: Soft. Bowel sounds are normal. There is no hepatosplenomegaly.  Musculoskeletal: Normal range of motion.  Neurological: She is alert and oriented to person, place, and time.  Skin: No rash noted.  Psychiatric: She has a normal mood and affect. Her behavior is normal. Judgment and thought content normal.    Results for orders placed or performed during the hospital encounter of 06/20/15  Hemoglobin and hematocrit, blood  Result Value Ref Range   Hemoglobin 9.3 (L) 12.0 - 16.0 g/dL   HCT 40.9 (L) 81.1 - 91.4 %  Hemoglobin and hematocrit, blood  Result Value Ref Range   Hemoglobin 9.2 (L) 12.0 - 16.0 g/dL   HCT 78.2 (L) 95.6 - 21.3 %  CBC  Result Value Ref Range   WBC 13.3 (H) 3.6 - 11.0 K/uL   RBC 2.76 (L) 3.80 - 5.20 MIL/uL   Hemoglobin 7.6 (L) 12.0 - 16.0 g/dL   HCT 08.6 (L) 57.8 - 46.9 %   MCV 84.4 80.0 -  100.0 fL   MCH 27.7 26.0 - 34.0 pg   MCHC 32.8 32.0 - 36.0 g/dL   RDW 16.1 (H) 09.6 - 04.5 %   Platelets 253 150 - 440 K/uL  Creatinine, serum  Result Value Ref Range   Creatinine, Ser 0.87 0.44 - 1.00 mg/dL   GFR calc non Af Amer >60 >60 mL/min   GFR calc Af Amer >60 >60 mL/min  Hemoglobin and hematocrit, blood  Result Value Ref Range   Hemoglobin 8.0 (L) 12.0 - 16.0 g/dL   HCT 40.9 (L) 81.1 - 91.4 %  CBC  Result Value Ref Range   WBC 9.4 3.6 - 11.0 K/uL    RBC 2.76 (L) 3.80 - 5.20 MIL/uL   Hemoglobin 8.0 (L) 12.0 - 16.0 g/dL   HCT 78.2 (L) 95.6 - 21.3 %   MCV 85.3 80.0 - 100.0 fL   MCH 28.9 26.0 - 34.0 pg   MCHC 33.8 32.0 - 36.0 g/dL   RDW 08.6 (H) 57.8 - 46.9 %   Platelets 187 150 - 440 K/uL  Pregnancy, urine POC  Result Value Ref Range   Preg Test, Ur NEGATIVE NEGATIVE  Prepare RBC  Result Value Ref Range   Order Confirmation ORDER PROCESSED BY BLOOD BANK   Surgical pathology  Result Value Ref Range   SURGICAL PATHOLOGY      Surgical Pathology CASE: [id number] PATIENT: Emily [name] Surgical Pathology Report     SPECIMEN SUBMITTED: A. Uterus, cervix and bilateral tubes  CLINICAL HISTORY: None provided  PRE-OPERATIVE DIAGNOSIS: Pelvic pain, heavy menses  POST-OPERATIVE DIAGNOSIS: Pelvic pain, heavy menses     DIAGNOSIS: A. UTERUS WITH CERVIX AND BILATERAL FALLOPIAN TUBES; HYSTERECTOMY WITH BILATERAL SALPINGECTOMY: - ACUTE CERVICITIS OF THE CERVIX. - BENIGN ENDOMETRIAL POLYP. - BACKGROUND ENDOMETRIUM SHOWING SECRETORILY EXHAUSTED GLANDS WITH DECIDUALIZED STROMA, CONSISTENT WITH PROGESTIN EFFECT. - BILATERAL FALLOPIAN TUBES WITHOUT PATHOLOGIC CHANGES. - NEGATIVE FOR HYPERPLASIA, DYSPLASIA AND CARCINOMA.   GROSS DESCRIPTION:  A. The specimen is received in a formalin-filled container labeled with the patient's name and uterus, cervix bilateral tubes.  Adnexa: bilateral tubes 4 fragment unattached Weight: 122 grams Dimensions:      Fundus -5.7 x 4.3 x 4.2 Cm      Cerv ix -3.2 x 3.3 cm Serosa: smooth pink-tan Cervix: pink-tan focally ragged Endocervix: trabecular tan Endometrial cavity:      Dimensions -3.2 x 1.7 cm      Thickness -0.4 cm      Other findings -none noted Myometrium:     Thickness -2.3 cm     Other findings -none noted Adnexa:      4 unattached fragment, and aggregate 9.2 x 1.0 x 0.9 cm, 2 fragments fimbriated  Block summary: 1-anterior cervix with lower uterine  segment 2-representative posterior cervix 3-representative posterior lower uterine segment 4-representative anterior endomyometrium 5-presented posterior endomyometrium 6-cross-sections and longitudinal fimbriated end 1 fragment 7-cross-sections and fimbriated end second fragment 8-representative remaining unattached fragments   Final Diagnosis performed by Glenice Bow, MD.  Electronically signed 06/21/2015 3:33:43PM    The electronic signature indicates that the named Attending Pathologist has evaluated the specimen  Technical component performe d at [facility], [address] [place], [place] [address] Lab: [phone] Dir: Titus Dubin. Cato Mulligan, MD  Professional component performed at [facility], [facility], [address] [place], [place], [place] [address] Lab: [phone] Dir: Georgiann Cocker. Oneita Kras, MD        Assessment & Plan:   Problem List Items Addressed This Visit  Cardiovascular and Mediastinum   Migraine    Currently controlled      Relevant Medications   buPROPion (WELLBUTRIN XL) 300 MG 24 hr tablet   amitriptyline (ELAVIL) 10 MG tablet     Other   Conversion disorder    Right arm stable with no changes      Bipolar disorder (HCC)    Stable for now      Relevant Medications   buPROPion (WELLBUTRIN XL) 300 MG 24 hr tablet   amitriptyline (ELAVIL) 10 MG tablet   Sleep apnea    Patient has not been using recently but skin is started again tonight on her CPAP.       Other Visit Diagnoses    Immunization due    -  Primary    Relevant Orders    Flu Vaccine QUAD 36+ mos PF IM (Fluarix & Fluzone Quad PF)    PE (physical exam), annual        Relevant Orders    Lipid panel    Comprehensive metabolic panel    CBC with Differential/Platelet    Urinalysis, Routine w reflex microscopic (not at [facility])    TSH        Follow up plan: Return in about 6 months (around 12/29/2015), or if symptoms worsen or fail to improve, for f/u.

## 2015-06-30 NOTE — Assessment & Plan Note (Signed)
Stable for now

## 2015-07-01 ENCOUNTER — Encounter: Payer: Self-pay | Admitting: Family Medicine

## 2015-07-01 LAB — COMPREHENSIVE METABOLIC PANEL
A/G RATIO: 1.4 (ref 1.1–2.5)
ALBUMIN: 4.2 g/dL (ref 3.5–5.5)
ALK PHOS: 94 IU/L (ref 39–117)
ALT: 11 IU/L (ref 0–32)
AST: 11 IU/L (ref 0–40)
BILIRUBIN TOTAL: 0.5 mg/dL (ref 0.0–1.2)
BUN / CREAT RATIO: 6 — AB (ref 8–20)
BUN: 5 mg/dL — ABNORMAL LOW (ref 6–20)
CO2: 23 mmol/L (ref 18–29)
CREATININE: 0.87 mg/dL (ref 0.57–1.00)
Calcium: 9.4 mg/dL (ref 8.7–10.2)
Chloride: 102 mmol/L (ref 97–108)
GFR calc Af Amer: 101 mL/min/{1.73_m2} (ref >59)
GFR calc non Af Amer: 87 mL/min/{1.73_m2} (ref >59)
GLOBULIN, TOTAL: 2.9 g/dL (ref 1.5–4.5)
Glucose: 96 mg/dL (ref 65–99)
Potassium: 4.7 mmol/L (ref 3.5–5.2)
SODIUM: 141 mmol/L (ref 134–144)
Total Protein: 7.1 g/dL (ref 6.0–8.5)

## 2015-07-01 LAB — CBC WITH DIFFERENTIAL/PLATELET
Basophils Absolute: 0 10*3/uL (ref 0.0–0.2)
Basos: 0 %
EOS (ABSOLUTE): 0.1 10*3/uL (ref 0.0–0.4)
EOS: 1 %
HEMATOCRIT: 34 % (ref 34.0–46.6)
Hemoglobin: 10.9 g/dL — ABNORMAL LOW (ref 11.1–15.9)
Immature Grans (Abs): 0 10*3/uL (ref 0.0–0.1)
Immature Granulocytes: 0 %
LYMPHS ABS: 2.9 10*3/uL (ref 0.7–3.1)
Lymphs: 27 %
MCH: 28.2 pg (ref 26.6–33.0)
MCHC: 32.1 g/dL (ref 31.5–35.7)
MCV: 88 fL (ref 79–97)
MONOS ABS: 1 10*3/uL — AB (ref 0.1–0.9)
Monocytes: 10 %
Neutrophils Absolute: 6.8 10*3/uL (ref 1.4–7.0)
Neutrophils: 62 %
Platelets: 455 10*3/uL — ABNORMAL HIGH (ref 150–379)
RBC: 3.86 x10E6/uL (ref 3.77–5.28)
RDW: 16 % — AB (ref 12.3–15.4)
WBC: 10.9 10*3/uL — AB (ref 3.4–10.8)

## 2015-07-01 LAB — TSH: TSH: 2.76 u[IU]/mL (ref 0.450–4.500)

## 2015-07-01 LAB — LIPID PANEL
CHOLESTEROL TOTAL: 169 mg/dL (ref 100–199)
Chol/HDL Ratio: 5.5 ratio units — ABNORMAL HIGH (ref 0.0–4.4)
HDL: 31 mg/dL — AB (ref >39)
LDL Calculated: 85 mg/dL (ref 0–99)
TRIGLYCERIDES: 263 mg/dL — AB (ref 0–149)
VLDL CHOLESTEROL CAL: 53 mg/dL — AB (ref 5–40)

## 2015-07-07 ENCOUNTER — Ambulatory Visit (INDEPENDENT_AMBULATORY_CARE_PROVIDER_SITE_OTHER): Payer: Managed Care, Other (non HMO) | Admitting: Obstetrics and Gynecology

## 2015-07-07 ENCOUNTER — Encounter: Payer: Self-pay | Admitting: Obstetrics and Gynecology

## 2015-07-07 VITALS — BP 111/80 | HR 81 | Resp 14 | Ht 67.8 in | Wt 251.4 lb

## 2015-07-07 DIAGNOSIS — T814XXA Infection following a procedure, initial encounter: Secondary | ICD-10-CM

## 2015-07-07 DIAGNOSIS — IMO0001 Reserved for inherently not codable concepts without codable children: Secondary | ICD-10-CM

## 2015-07-07 MED ORDER — CEPHALEXIN 500 MG PO CAPS: 500.0000 mg | ORAL_CAPSULE | Freq: Three times a day (TID) | ORAL | Status: DC

## 2015-07-07 NOTE — Progress Notes (Signed)
GYNECOLOGY PROGRESS NOTE  Subjective:    Patient ID: Emily [name], female    DOB: 20-Aug-1981, 34 y.o.   MRN: [id number]  HPI  Patient is a 34 y.o. [id number] female who presents for complaints of redness around incision sites. Denies fevers, chills, nausea/vomiting. Does note scant yellowish drainage from left incision site. Is  2 weeks s/p laparoscopic assisted vaginal hysterectomy and bilateral salpingectomy for abnormal uterine bleeding and pelvic pain.   The following portions of the patient's history were reviewed and updated as appropriate: allergies, current medications, past family history, past medical history, past social history, past surgical history and problem list.  Review of Systems Pertinent items noted in HPI and remainder of comprehensive ROS otherwise negative.   Objective:   Blood pressure 111/80, pulse 81, resp. rate 14, height 5' 7.8" (1.722 m), weight 251 lb 6.4 oz (114.034 kg), last menstrual period 06/01/2015. General appearance: alert and no distress Abdomen: soft, mildly tender at incision sites.  Erythema noted at left and right lateral port sites. Umbilcal site healing well.  Liquiband in place.  Slight oozing of serosanguinous fluid noted from left lateral laparoscopic incision site. Pelvic: deferred    Assessment:   Wound cellulitis  Plan:   Liquiband dressings removed, incision sites cleaned with hydrogen peroxide, dressed with benzoin and steri-strips.   Will prescribe Keflex for suspected wound cellulitis.  RTC in 4 weeks for final post-op check.  To f/u sooner for worsening symptoms.    Hildred LaserAnika Romir Klimowicz, MD [facility]

## 2015-08-02 ENCOUNTER — Ambulatory Visit (INDEPENDENT_AMBULATORY_CARE_PROVIDER_SITE_OTHER): Payer: Managed Care, Other (non HMO) | Admitting: Obstetrics and Gynecology

## 2015-08-02 ENCOUNTER — Encounter: Payer: Managed Care, Other (non HMO) | Admitting: Obstetrics and Gynecology

## 2015-08-02 ENCOUNTER — Ambulatory Visit (INDEPENDENT_AMBULATORY_CARE_PROVIDER_SITE_OTHER): Payer: Managed Care, Other (non HMO) | Admitting: Family Medicine

## 2015-08-02 ENCOUNTER — Encounter: Payer: Self-pay | Admitting: Family Medicine

## 2015-08-02 VITALS — BP 136/81 | HR 102 | Ht 67.5 in | Wt 264.6 lb

## 2015-08-02 VITALS — BP 131/80 | HR 76 | Temp 98.3°F | Wt 263.0 lb

## 2015-08-02 DIAGNOSIS — S93402A Sprain of unspecified ligament of left ankle, initial encounter: Secondary | ICD-10-CM | POA: Diagnosis not present

## 2015-08-02 DIAGNOSIS — S93409A Sprain of unspecified ligament of unspecified ankle, initial encounter: Secondary | ICD-10-CM | POA: Insufficient documentation

## 2015-08-02 DIAGNOSIS — D6489 Other specified anemias: Secondary | ICD-10-CM

## 2015-08-02 DIAGNOSIS — Z90710 Acquired absence of both cervix and uterus: Secondary | ICD-10-CM

## 2015-08-02 NOTE — Assessment & Plan Note (Signed)
Patient with at least left ankle sprain patient will check on x-ray report when available If ankle fracture patient will of course follow up with orthopedics Discuss sprain care and treatment of patient making expected improvement over the next several weeks we will continue with the home exercise program If any hicough  with program or problems with pain will refer to orthopedics and/or physical therapy.

## 2015-08-02 NOTE — Progress Notes (Signed)
BP 131/80 mmHg  Pulse 76  Temp(Src) 98.3 F (36.8 C)  Wt 263 lb (119.296 kg)  SpO2 99%  LMP 06/01/2015   Subjective:    Patient ID: Emily [name], female    DOB: 11-20-1980, 34 y.o.   MRN: [id number]  HPI: Emily [name] is a 34 y.o. female  Chief Complaint  Patient presents with  . Foot Pain    left foot, fell 08/01/15 [facility] with xrays   patient fell yesterday in her yard spraining her ankle was [facility] at [facility] in a walking boot with crutches. Patient still having a great deal of pain and difficulty can't wear weight. Patient has some ibuprofen that she has taken but none today is able to elevate her foot in bed. Other meds doing stable. Right arm is stable but bruised  Relevant past medical, surgical, family and social history reviewed and updated as indicated. Interim medical history since our last visit reviewed. Allergies and medications reviewed and updated.  Review of Systems  Constitutional: Negative.   Respiratory: Negative.   Cardiovascular: Negative.     Per HPI unless specifically indicated above     Objective:    BP 131/80 mmHg  Pulse 76  Temp(Src) 98.3 F (36.8 C)  Wt 263 lb (119.296 kg)  SpO2 99%  LMP 06/01/2015  Wt Readings from Last 3 Encounters:  08/02/15 263 lb (119.296 kg)  07/07/15 251 lb 6.4 oz (114.034 kg)  06/30/15 254 lb (115.214 kg)    Physical Exam  Constitutional: She is oriented to person, place, and time. She appears well-developed and well-nourished. No distress.  HENT:  Head: Normocephalic and atraumatic.  Right Ear: Hearing normal.  Left Ear: Hearing normal.  Nose: Nose normal.  Eyes: Conjunctivae and lids are normal. Right eye exhibits no discharge. Left eye exhibits no discharge. No scleral icterus.  Pulmonary/Chest: Effort normal. No respiratory distress.  Musculoskeletal: Normal range of motion.  Patient's left ankle swollen tender laterally also tender down into the fourth and fifth metatarsal area with pain to  the touch medial side with no tenderness Limited range of motion No joint laxity Point tenderness over malleolus insertion Chart review in Epic no x-ray report yet  Neurological: She is alert and oriented to person, place, and time.  Skin: Skin is intact. No rash noted.  Psychiatric: She has a normal mood and affect. Her speech is normal and behavior is normal. Judgment and thought content normal. Cognition and memory are normal.    Results for orders placed or performed in visit on 06/30/15  Microscopic Examination  Result Value Ref Range   WBC, UA 6-10 (A) 0 -  5 /hpf   RBC, UA 0-2 0 -  2 /hpf   Epithelial Cells (non renal) 0-10 0 - 10 /hpf   Renal Epithel, UA None seen None seen /hpf   Mucus, UA Present Not Estab.   Bacteria, UA Few None seen/Few  Lipid panel  Result Value Ref Range   Cholesterol, Total 169 100 - 199 mg/dL   Triglycerides 191 (H) 0 - 149 mg/dL   HDL 31 (L) >47 mg/dL   VLDL Cholesterol Cal 53 (H) 5 - 40 mg/dL   LDL Calculated 85 0 - 99 mg/dL   Chol/HDL Ratio 5.5 (H) 0.0 - 4.4 ratio units  Comprehensive metabolic panel  Result Value Ref Range   Glucose 96 65 - 99 mg/dL   BUN 5 (L) 6 - 20 mg/dL   Creatinine, Ser 8.29 0.57 -  1.00 mg/dL   GFR calc non Af Amer 87 >59 mL/min/1.73   GFR calc Af Amer 101 >59 mL/min/1.73   BUN/Creatinine Ratio 6 (L) 8 - 20   Sodium 141 134 - 144 mmol/L   Potassium 4.7 3.5 - 5.2 mmol/L   Chloride 102 97 - 108 mmol/L   CO2 23 18 - 29 mmol/L   Calcium 9.4 8.7 - 10.2 mg/dL   Total Protein 7.1 6.0 - 8.5 g/dL   Albumin 4.2 3.5 - 5.5 g/dL   Globulin, Total 2.9 1.5 - 4.5 g/dL   Albumin/Globulin Ratio 1.4 1.1 - 2.5   Bilirubin Total 0.5 0.0 - 1.2 mg/dL   Alkaline Phosphatase 94 39 - 117 IU/L   AST 11 0 - 40 IU/L   ALT 11 0 - 32 IU/L  CBC with Differential/Platelet  Result Value Ref Range   WBC 10.9 (H) 3.4 - 10.8 x10E3/uL   RBC 3.86 3.77 - 5.28 x10E6/uL   Hemoglobin 10.9 (L) 11.1 - 15.9 g/dL   Hematocrit 16.134.0 09.634.0 - 46.6 %   MCV  88 79 - 97 fL   MCH 28.2 26.6 - 33.0 pg   MCHC 32.1 31.5 - 35.7 g/dL   RDW 04.516.0 (H) 40.912.3 - 81.115.4 %   Platelets 455 (H) 150 - 379 x10E3/uL   Neutrophils 62 %   Lymphs 27 %   Monocytes 10 %   Eos 1 %   Basos 0 %   Neutrophils Absolute 6.8 1.4 - 7.0 x10E3/uL   Lymphocytes Absolute 2.9 0.7 - 3.1 x10E3/uL   Monocytes Absolute 1.0 (H) 0.1 - 0.9 x10E3/uL   EOS (ABSOLUTE) 0.1 0.0 - 0.4 x10E3/uL   Basophils Absolute 0.0 0.0 - 0.2 x10E3/uL   Immature Granulocytes 0 %   Immature Grans (Abs) 0.0 0.0 - 0.1 x10E3/uL  Urinalysis, Routine w reflex microscopic (not at [facility])  Result Value Ref Range   Specific Gravity, UA 1.010 1.005 - 1.030   pH, UA 6.0 5.0 - 7.5   Color, UA Yellow Yellow   Appearance Ur Cloudy (A) Clear   Leukocytes, UA 2+ (A) Negative   Protein, UA Negative Negative/Trace   Glucose, UA Negative Negative   Ketones, UA Negative Negative   RBC, UA Trace (A) Negative   Bilirubin, UA Negative Negative   Urobilinogen, Ur 0.2 0.2 - 1.0 mg/dL   Nitrite, UA Negative Negative   Microscopic Examination See below:   TSH  Result Value Ref Range   TSH 2.760 0.450 - 4.500 uIU/mL      Assessment & Plan:   Problem List Items Addressed This Visit      Musculoskeletal and Integument   Ankle sprain - Primary    Patient with at least left ankle sprain patient will check on x-ray report when available If ankle fracture patient will of course follow up with orthopedics Discuss sprain care and treatment of patient making expected improvement over the next several weeks we will continue with the home exercise program If any hicough  with program or problems with pain will refer to orthopedics and/or physical therapy.          Follow up plan: Return if symptoms worsen or fail to improve, for As scheduled.

## 2015-08-02 NOTE — Progress Notes (Signed)
GYNECOLOGY CLINIC POST-OPERATIVE VISIT  Subjective:     Emily [name] is a 34 y.o. female who presents to the clinic 6 weeks status post laparoscopic assisted vaginal hysterectomy and bilateral salpingectomy for abnormal uterine bleeding and pelvic pain. Surgery complicated by hemorhage, received 2 units PRBCs post-op for symptomatic anemia.  Taking iron twice daily. Eating a regular diet without difficulty. Bowel movements are normal. The patient is not having any pain.  The following portions of the patient's history were reviewed and updated as appropriate: allergies, current medications, past family history, past medical history, past social history, past surgical history and problem list.  Review of Systems A comprehensive review of systems was negative except for: Musculoskeletal: positive for left foot pain, s/p fall.  Currently with boot in place, on crutches for sprain    Objective:    BP 136/81 mmHg  Pulse 102  Ht 5' 7.5" (1.715 m)  Wt 264 lb 9.6 oz (120.022 kg)  BMI 40.81 kg/m2  LMP 06/01/2015 General:  alert and no distress  Abdomen: soft, bowel sounds active, non-tender, no abnormal masses, no hernias  Incision:   healing well, no drainage, no erythema, no hernia, no seroma, no swelling, no dehiscence, incision well approximated  Pelvis:  External genitalia normal, vagina with scant white thin discharge, no odor.  Cervix and uterus surgically absent.  Vaginal cuff healing well suture material still in place.       Pathology 06/20/15:  DIAGNOSIS:  A. UTERUS WITH CERVIX AND BILATERAL FALLOPIAN TUBES; HYSTERECTOMY WITH  BILATERAL SALPINGECTOMY:  - ACUTE CERVICITIS OF THE CERVIX.  - BENIGN ENDOMETRIAL POLYP.  - BACKGROUND ENDOMETRIUM SHOWING SECRETORILY EXHAUSTED GLANDS WITH  DECIDUALIZED STROMA, CONSISTENT WITH PROGESTIN EFFECT.  - BILATERAL FALLOPIAN TUBES WITHOUT PATHOLOGIC CHANGES.  - NEGATIVE FOR HYPERPLASIA, DYSPLASIA AND CARCINOMA.   Assessment:    Doing well  postoperatively.  Anemia (due to surgical blood loss) Obesity   Plan:   1. Continue any current medications.  2.  Activity restrictions: pelvic rest x 2 additional weeks  3. Anticipated return to work: not applicable. 4. Operative findings again reviewed. Pathology report discussed. 5. Patient desires to discuss weight loss.  Has been monitoring diet and exercising with no real results.  Can refer to [facility] for further weight loss management with medication.  6. H/H ordered for anemia.   Hildred LaserAnika Phuong Hillary, MD [facility]   Hildred LaserAnika Illana Nolting, MD [facility] 08/02/2015 5:54 PM

## 2015-08-03 LAB — HEMOGLOBIN AND HEMATOCRIT, BLOOD
HEMOGLOBIN: 12.2 g/dL (ref 11.1–15.9)
Hematocrit: 37.7 % (ref 34.0–46.6)

## 2015-08-25 ENCOUNTER — Encounter: Payer: Self-pay | Admitting: Obstetrics and Gynecology

## 2015-08-25 ENCOUNTER — Ambulatory Visit (INDEPENDENT_AMBULATORY_CARE_PROVIDER_SITE_OTHER): Payer: Managed Care, Other (non HMO) | Admitting: Obstetrics and Gynecology

## 2015-08-25 VITALS — BP 124/72 | HR 92 | Ht 69.0 in | Wt 266.8 lb

## 2015-08-25 DIAGNOSIS — E669 Obesity, unspecified: Secondary | ICD-10-CM | POA: Diagnosis not present

## 2015-08-25 MED ORDER — PHENTERMINE HCL 37.5 MG PO TABS: 37.5000 mg | ORAL_TABLET | Freq: Every day | ORAL | Status: DC

## 2015-08-25 MED ORDER — CYANOCOBALAMIN 1000 MCG/ML IJ SOLN: 1000.0000 ug | Freq: Once | INTRAMUSCULAR | Status: DC

## 2015-08-25 NOTE — Patient Instructions (Signed)
Consider the Low Glycemic Index Diet and 6 smaller meals daily .  This boosts your metabolism and regulates your sugars:   Use the protein bar by Atkins because they have lots of fiber in them  Find the low carb flatbreads, tortillas and pita breads for sandwiches:  Joseph's makes a pita bread and a flat bread , available at [employer] and [employer]; Toufayah makes a low carb flatbread available at [employer] and HT that is 9 net carbs and 100 cal Mission makes a low carb whole wheat tortilla available at [employer]Js,and most grocery stores with 6 net carbs and 210 cal  Greek yogurt can still have a lot of carbs .  Dannon Light N fit has 80 cal and 8 carbs  

## 2015-08-25 NOTE — Progress Notes (Signed)
Patient ID: Emily [name], female   DOB: Dec 14, 1980, 34 y.o.   MRN: [id number] Subjective:  Emily Beammily N Ferre is a 34 y.o. G3P3003 at Unknown being seen today for weight loss management- initial visit.  Patient reports General ROS: negative and reports previous weight loss attempts: Management changes made at the last visit include:  adding medication, stopping medication, changing medication dose and ordering test(s).  Onset was sudden/gradua,  months/year(s) ago.  Onset followed:  starting medication, change in living environment, change in affect, recent pregnancy, and mental status changes. Associated symptoms include: fatigue, depression, anxiety, greasy stools, abdominal pain, polydipsia, headaches, change in clothing fit and menstrual changes. Previous/Current treatment includes: small frequent feedings, nutritional supplement, vitamin supplement, gluten free diet,   The patient has a surgical history of:  hysterectomy. Past treatment has included: small frequent feedings, nutritional supplement, vitamin supplement, The following portions of the patient's history were reviewed and updated as appropriate: allergies, current medications, past family history, past medical history, past social history, past surgical history and problem list.   Objective:   Filed Vitals:   08/25/15 1347  BP: 124/72  Pulse: 92  Height: 5\' 9"  (1.753 m)  Weight: 266 lb 12.8 oz (121.02 kg)    General:  Alert, oriented and cooperative. Patient is in no acute distress.  :   :   :   :   :   :   PE: Well groomed female in no current distress,   Mental Status: Normal mood and affect. Normal behavior. Normal judgment and thought content.   Current BMI: Body mass index is 39.38 kg/(m^2).   Assessment and Plan:  Obesity  1. Obesity Desires to lose weight   Plan: low carb, High protein diet RX for adipex 37.5 mg daily and B12 1000mcg.ml monthly, to start now with first injection given at today's visit.  Reviewed side-effects common to both medications and expected outcomes. Increase daily water intake to at least 8 bottle a day, every day.  Goal is to reduse weight by 10% by end of three months, and will re-evaluate then.  RTC in 4 weeks for Nurse visit to check weight & BP, and get next B12 injections.    Please refer to After Visit Summary for other counseling recommendations.    Estell Dillinger Suzan NailerN Jeily Guthridge, CNM   Consider the Low Glycemic Index Diet and 6 smaller meals daily .  This boosts your metabolism and regulates your sugars:   Use the protein bar by Atkins because they have lots of fiber in them  Find the low carb flatbreads, tortillas and pita breads for sandwiches:  Joseph's makes a pita bread and a flat bread , available at [facility] and [employer]; Toufayah makes a low carb flatbread available at [employer] and [employer] that is 9 net carbs and 100 cal Mission makes a low carb whole wheat tortilla available at [employer],and most grocery stores with 6 net carbs and 210 cal  [place] yogurt can still have a lot of carbs .  Dannon Light N fit has 80 cal and 8 carbs

## 2015-09-22 ENCOUNTER — Ambulatory Visit (INDEPENDENT_AMBULATORY_CARE_PROVIDER_SITE_OTHER): Payer: Managed Care, Other (non HMO) | Admitting: Obstetrics and Gynecology

## 2015-09-22 VITALS — BP 105/80 | HR 76 | Ht 68.6 in | Wt 256.1 lb

## 2015-09-22 DIAGNOSIS — E669 Obesity, unspecified: Secondary | ICD-10-CM

## 2015-09-22 MED ORDER — CYANOCOBALAMIN 1000 MCG/ML IJ SOLN
1000.0000 ug | Freq: Once | INTRAMUSCULAR | Status: AC
  Administered 2015-09-22: 1000 ug via INTRAMUSCULAR

## 2015-09-22 NOTE — Progress Notes (Cosign Needed)
Patient ID: Emily [name], female   DOB: May 17, 1981, 34 y.o.   MRN: [id number] Pt presents for weight, B/P, B-12 injection. No side effects of medication-Phentermine, or B-12.  Weight loss of 10 lbs. Encouraged eating healthy and exercise. On September 10, 2015 pt was sent to ER for Bladder infection. She became unconscious for about an hour. Treated with ATB but stopped Wellbutrin and phentermine because the combination made her feel bad. Much better now.

## 2015-10-20 ENCOUNTER — Ambulatory Visit (INDEPENDENT_AMBULATORY_CARE_PROVIDER_SITE_OTHER): Payer: Managed Care, Other (non HMO) | Admitting: Obstetrics and Gynecology

## 2015-10-20 VITALS — BP 124/84 | HR 73 | Wt 252.4 lb

## 2015-10-20 MED ORDER — CYANOCOBALAMIN 1000 MCG/ML IJ SOLN
1000.0000 ug | Freq: Once | INTRAMUSCULAR | Status: AC
  Administered 2015-10-20: 1000 ug via INTRAMUSCULAR

## 2015-10-20 NOTE — Progress Notes (Cosign Needed)
Patient ID: Emily [name], female   DOB: 1981-03-11, 35 y.o.   MRN: [id number] Pt presents for weight, B/P, B-12 injection. No side effects of medication-Phentermine, or B-12.  Weight loss of 4 lbs. Encouraged eating healthy and exercise. Pt states the week before her B-12 injection is due she gets extremely exhausted, dizziness, and headache and wonders if she needed extra B-12 or what. She states the same thing happened last month. Informed pt that I document this and let provider know before hand. Offered pt to make an appt but she opted to wait until next visit when she sees provider. To contact office for an appt if problems worsens and needs to be seen before then. Pt stopped taking Wellbutrin XL due to side effects with phentermine.

## 2015-11-01 ENCOUNTER — Encounter: Payer: Self-pay | Admitting: Obstetrics and Gynecology

## 2015-11-01 ENCOUNTER — Ambulatory Visit (INDEPENDENT_AMBULATORY_CARE_PROVIDER_SITE_OTHER): Payer: Managed Care, Other (non HMO) | Admitting: Obstetrics and Gynecology

## 2015-11-01 VITALS — BP 109/72 | HR 78 | Ht 69.0 in | Wt 256.2 lb

## 2015-11-01 DIAGNOSIS — Z79899 Other long term (current) drug therapy: Secondary | ICD-10-CM

## 2015-11-01 MED ORDER — FLUOXETINE HCL 10 MG PO CAPS: 10.0000 mg | ORAL_CAPSULE | Freq: Every day | ORAL | Status: DC

## 2015-11-01 NOTE — Progress Notes (Signed)
Subjective:     Patient ID: Emily [name], female   DOB: [birth year], 35 y.o.   MRN: [id number]  HPI Reports vomiting when takes wellbutrin and phetermine together, PCP told her to stop wellbutrin, and it helped, but mood swings have returned and worsened, desires to try something different. Doesn't want to stop phentermine as she has lost 20# in last 2 & 1/2 months.  Review of Systems See above    Objective:   Physical Exam A&O x4 Well groomed obese female in no distress Blood pressure 109/72, pulse 78, height  (1.753 m), weight 256 lb 3.2 oz (116.212 kg), last menstrual period 06/01/2015.     Assessment:     Nausea and vomiting when combines wellbutrin & phentermine     Plan:     Will change to prozac - to take at bedtime, and continue phentermine in am, RTC in 4 weeks.  Melody [place], CNM

## 2015-11-11 ENCOUNTER — Encounter: Payer: Self-pay | Admitting: Family Medicine

## 2015-11-17 ENCOUNTER — Ambulatory Visit: Payer: Managed Care, Other (non HMO) | Admitting: Obstetrics and Gynecology

## 2015-11-29 ENCOUNTER — Ambulatory Visit (INDEPENDENT_AMBULATORY_CARE_PROVIDER_SITE_OTHER): Payer: Managed Care, Other (non HMO) | Admitting: Obstetrics and Gynecology

## 2015-11-29 ENCOUNTER — Encounter: Payer: Self-pay | Admitting: Obstetrics and Gynecology

## 2015-11-29 VITALS — BP 133/83 | HR 74 | Ht 69.0 in | Wt 252.9 lb

## 2015-11-29 DIAGNOSIS — R5383 Other fatigue: Secondary | ICD-10-CM | POA: Diagnosis not present

## 2015-11-29 DIAGNOSIS — E669 Obesity, unspecified: Secondary | ICD-10-CM

## 2015-11-29 MED ORDER — PHENTERMINE HCL 37.5 MG PO TABS: 37.5000 mg | ORAL_TABLET | Freq: Every day | ORAL | Status: DC

## 2015-11-29 NOTE — Progress Notes (Signed)
SUBJECTIVE:  35 y.o. here for follow-up weight loss visit, previously seen 4 weeks ago. Denies any concerns and feels like medication worked well, and switching her to prozac has been 'perfect' too. Vomiting she was experiencing with wellbutrin has ceased and mood swings have almost ceased. Desires continuing to do weight loss plan to get BMI down. Does report fatigue over last few weeks, and has h/o anemia and vit D def.  OBJECTIVE:  BP 133/83 mmHg  Pulse 74  Ht 5\' 9"  (1.753 m)  Wt 252 lb 14.4 oz (114.715 kg)  BMI 37.33 kg/m2  LMP 06/01/2015  Body mass index is 37.33 kg/(m^2). Patient appears well. Total of 17# down to date.  ASSESSMENT:  Obesity- responding well to weight loss plan Fatigue H/o anemia H/o vit D deficiency  PLAN:  To continue with current medications. B12 106200mcg/ml injection given. Labs obtained today. RTC in 4 weeks as planned  Atoya Andrew [place], CNM

## 2015-11-30 ENCOUNTER — Other Ambulatory Visit: Payer: Self-pay | Admitting: Obstetrics and Gynecology

## 2015-11-30 LAB — CBC
HEMOGLOBIN: 13.7 g/dL (ref 11.1–15.9)
Hematocrit: 41.2 % (ref 34.0–46.6)
MCH: 28.9 pg (ref 26.6–33.0)
MCHC: 33.3 g/dL (ref 31.5–35.7)
MCV: 87 fL (ref 79–97)
Platelets: 329 10*3/uL (ref 150–379)
RBC: 4.74 x10E6/uL (ref 3.77–5.28)
RDW: 15.4 % (ref 12.3–15.4)
WBC: 8.7 10*3/uL (ref 3.4–10.8)

## 2015-11-30 LAB — THYROID PANEL WITH TSH
Free Thyroxine Index: 1.8 (ref 1.2–4.9)
T3 UPTAKE RATIO: 25 % (ref 24–39)
T4 TOTAL: 7.2 ug/dL (ref 4.5–12.0)
TSH: 3.31 u[IU]/mL (ref 0.450–4.500)

## 2015-11-30 LAB — COMPREHENSIVE METABOLIC PANEL
ALBUMIN: 4.2 g/dL (ref 3.5–5.5)
ALK PHOS: 93 IU/L (ref 39–117)
ALT: 28 IU/L (ref 0–32)
AST: 22 IU/L (ref 0–40)
Albumin/Globulin Ratio: 1.4 (ref 1.1–2.5)
BILIRUBIN TOTAL: 1.1 mg/dL (ref 0.0–1.2)
BUN / CREAT RATIO: 8 (ref 8–20)
BUN: 7 mg/dL (ref 6–20)
CHLORIDE: 101 mmol/L (ref 96–106)
CO2: 24 mmol/L (ref 18–29)
Calcium: 9.7 mg/dL (ref 8.7–10.2)
Creatinine, Ser: 0.87 mg/dL (ref 0.57–1.00)
GFR calc Af Amer: 100 mL/min/{1.73_m2} (ref >59)
GFR calc non Af Amer: 87 mL/min/{1.73_m2} (ref >59)
GLOBULIN, TOTAL: 3.1 g/dL (ref 1.5–4.5)
GLUCOSE: 82 mg/dL (ref 65–99)
Potassium: 4.8 mmol/L (ref 3.5–5.2)
SODIUM: 140 mmol/L (ref 134–144)
Total Protein: 7.3 g/dL (ref 6.0–8.5)

## 2015-11-30 LAB — VITAMIN B12: Vitamin B-12: 268 pg/mL (ref 211–946)

## 2015-11-30 LAB — IRON: IRON: 94 ug/dL (ref 27–159)

## 2015-11-30 LAB — VITAMIN D 25 HYDROXY (VIT D DEFICIENCY, FRACTURES): VIT D 25 HYDROXY: 19.9 ng/mL — AB (ref 30.0–100.0)

## 2015-11-30 MED ORDER — VITAMIN D (ERGOCALCIFEROL) 1.25 MG (50000 UNIT) PO CAPS: 50000.0000 [IU] | ORAL_CAPSULE | ORAL | Status: DC

## 2015-12-16 ENCOUNTER — Telehealth: Payer: Self-pay | Admitting: Unknown Physician Specialty

## 2015-12-16 MED ORDER — OSELTAMIVIR PHOSPHATE 75 MG PO CAPS: 75.0000 mg | ORAL_CAPSULE | Freq: Every day | ORAL | Status: DC

## 2015-12-16 NOTE — Telephone Encounter (Signed)
Routing to a provider. I believe this is a Dr. Dossie Arbourrissman patient.

## 2015-12-16 NOTE — Telephone Encounter (Signed)
Pt's [facility] just [facility]. Pt needs preventative medication sent to [facility]. Thanks.

## 2015-12-26 ENCOUNTER — Encounter: Payer: Self-pay | Admitting: Family Medicine

## 2015-12-26 ENCOUNTER — Ambulatory Visit (INDEPENDENT_AMBULATORY_CARE_PROVIDER_SITE_OTHER): Payer: Managed Care, Other (non HMO) | Admitting: Family Medicine

## 2015-12-26 VITALS — BP 114/78 | HR 82 | Temp 98.7°F | Ht 68.5 in | Wt 249.0 lb

## 2015-12-26 DIAGNOSIS — F339 Major depressive disorder, recurrent, unspecified: Secondary | ICD-10-CM | POA: Diagnosis not present

## 2015-12-26 DIAGNOSIS — G43909 Migraine, unspecified, not intractable, without status migrainosus: Secondary | ICD-10-CM | POA: Diagnosis not present

## 2015-12-26 MED ORDER — FLUOXETINE HCL 20 MG PO CAPS: 20.0000 mg | ORAL_CAPSULE | Freq: Every day | ORAL | Status: DC

## 2015-12-26 NOTE — Progress Notes (Signed)
BP 114/78 mmHg  Pulse 82  Temp(Src) 98.7 F (37.1 C)  Ht 5' 8.5" (1.74 m)  Wt 249 lb (112.946 kg)  BMI 37.31 kg/m2  SpO2 99%  LMP 06/01/2015   Subjective:    Patient ID: Emily [name], female    DOB: 1981/04/21, 35 y.o.   MRN: [id number]  HPI: Emily [name] is a 35 y.o. female  Chief Complaint  Patient presents with  . Depression  She'll follow-up depression switch from Wellbutrin due to contamination interaction which is getting from GYN is on low-dose Prozac 10 mg  interested in increasing to 20 mg.  Patient also had a bad migraine headache has tried migraine medications lasted about a week but is on the other side of it.  Patient resolved is now training for a 5K and doing better Conversion reaction and right arm is stable  Relevant past medical, surgical, family and social history reviewed and updated as indicated. Interim medical history since our last visit reviewed. Allergies and medications reviewed and updated.  Review of Systems  Constitutional: Negative.   Respiratory: Negative.   Cardiovascular: Negative.     Per HPI unless specifically indicated above     Objective:    BP 114/78 mmHg  Pulse 82  Temp(Src) 98.7 F (37.1 C)  Ht 5' 8.5" (1.74 m)  Wt 249 lb (112.946 kg)  BMI 37.31 kg/m2  SpO2 99%  LMP 06/01/2015  Wt Readings from Last 3 Encounters:  12/26/15 249 lb (112.946 kg)  11/29/15 252 lb 14.4 oz (114.715 kg)  11/01/15 256 lb 3.2 oz (116.212 kg)    Physical Exam  Constitutional: She is oriented to person, place, and time. She appears well-developed and well-nourished. No distress.  HENT:  Head: Normocephalic and atraumatic.  Right Ear: Hearing normal.  Left Ear: Hearing normal.  Nose: Nose normal.  Eyes: Conjunctivae and lids are normal. Right eye exhibits no discharge. Left eye exhibits no discharge. No scleral icterus.  Cardiovascular: Normal rate, regular rhythm and normal heart sounds.   Pulmonary/Chest: Effort normal and breath  sounds normal. No respiratory distress.  Musculoskeletal: Normal range of motion.  Neurological: She is alert and oriented to person, place, and time.  Skin: Skin is intact. No rash noted.  Psychiatric: She has a normal mood and affect. Her speech is normal and behavior is normal. Judgment and thought content normal. Cognition and memory are normal.    Results for orders placed or performed in visit on 11/29/15  CBC  Result Value Ref Range   WBC 8.7 3.4 - 10.8 x10E3/uL   RBC 4.74 3.77 - 5.28 x10E6/uL   Hemoglobin 13.7 11.1 - 15.9 g/dL   Hematocrit 40.9 81.1 - 46.6 %   MCV 87 79 - 97 fL   MCH 28.9 26.6 - 33.0 pg   MCHC 33.3 31.5 - 35.7 g/dL   RDW 91.4 78.2 - 95.6 %   Platelets 329 150 - 379 x10E3/uL  Thyroid Panel With TSH  Result Value Ref Range   TSH 3.310 0.450 - 4.500 uIU/mL   T4, Total 7.2 4.5 - 12.0 ug/dL   T3 Uptake Ratio 25 24 - 39 %   Free Thyroxine Index 1.8 1.2 - 4.9  Comprehensive metabolic panel  Result Value Ref Range   Glucose 82 65 - 99 mg/dL   BUN 7 6 - 20 mg/dL   Creatinine, Ser 2.13 0.57 - 1.00 mg/dL   GFR calc non Af Amer 87 >59 mL/min/1.73   GFR calc Af  Amer 100 >59 mL/min/1.73   BUN/Creatinine Ratio 8 8 - 20   Sodium 140 134 - 144 mmol/L   Potassium 4.8 3.5 - 5.2 mmol/L   Chloride 101 96 - 106 mmol/L   CO2 24 18 - 29 mmol/L   Calcium 9.7 8.7 - 10.2 mg/dL   Total Protein 7.3 6.0 - 8.5 g/dL   Albumin 4.2 3.5 - 5.5 g/dL   Globulin, Total 3.1 1.5 - 4.5 g/dL   Albumin/Globulin Ratio 1.4 1.1 - 2.5   Bilirubin Total 1.1 0.0 - 1.2 mg/dL   Alkaline Phosphatase 93 39 - 117 IU/L   AST 22 0 - 40 IU/L   ALT 28 0 - 32 IU/L  Iron  Result Value Ref Range   Iron 94 27 - 159 ug/dL  Vitamin D (25 hydroxy)  Result Value Ref Range   Vit D, 25-Hydroxy 19.9 (L) 30.0 - 100.0 ng/mL  B12  Result Value Ref Range   Vitamin B-12 268 211 - 946 pg/mL      Assessment & Plan:   Problem List Items Addressed This Visit      Cardiovascular and Mediastinum   Migraine -  Primary    The current medical regimen is effective;  continue present plan and medications.       Relevant Medications   FLUoxetine (PROZAC) 20 MG capsule     Other   Recurrent depression (HCC)    Increase Prozac to 20mg       Relevant Medications   FLUoxetine (PROZAC) 20 MG capsule       Follow up plan: Return in about 6 months (around 06/26/2016), or if symptoms worsen or fail to improve, for Physical Exam.

## 2015-12-26 NOTE — Assessment & Plan Note (Signed)
Increase Prozac to 20 mg.

## 2015-12-26 NOTE — Assessment & Plan Note (Signed)
The current medical regimen is effective;  continue present plan and medications.  

## 2015-12-27 ENCOUNTER — Ambulatory Visit (INDEPENDENT_AMBULATORY_CARE_PROVIDER_SITE_OTHER): Payer: Managed Care, Other (non HMO) | Admitting: Obstetrics and Gynecology

## 2015-12-27 VITALS — BP 114/84 | HR 70 | Wt 249.4 lb

## 2015-12-27 DIAGNOSIS — E669 Obesity, unspecified: Secondary | ICD-10-CM | POA: Diagnosis not present

## 2015-12-27 MED ORDER — CYANOCOBALAMIN 1000 MCG/ML IJ SOLN
1000.0000 ug | Freq: Once | INTRAMUSCULAR | Status: AC
  Administered 2015-12-27: 1000 ug via INTRAMUSCULAR

## 2015-12-27 NOTE — Progress Notes (Signed)
Patient ID: Emily [name], female   DOB: Jun 15, 1981, 35 y.o.   MRN: [id number] Pt presents for weight, B/P, B-12 injection. No side effects of medication-Phentermine, or B-12.  Weight stable at 249lb  lbs. Encouraged eating healthy and exercise. Pt has been on Tamiflu due to son having the flu. Pt did not have the flu. States it was not a good month.

## 2015-12-29 ENCOUNTER — Ambulatory Visit: Payer: Managed Care, Other (non HMO) | Admitting: Family Medicine

## 2016-01-02 ENCOUNTER — Encounter: Payer: Self-pay | Admitting: Family Medicine

## 2016-01-02 ENCOUNTER — Ambulatory Visit (INDEPENDENT_AMBULATORY_CARE_PROVIDER_SITE_OTHER): Payer: Managed Care, Other (non HMO) | Admitting: Family Medicine

## 2016-01-02 VITALS — BP 126/86 | HR 78 | Temp 99.2°F | Ht 68.0 in | Wt 251.0 lb

## 2016-01-02 DIAGNOSIS — R509 Fever, unspecified: Secondary | ICD-10-CM

## 2016-01-02 DIAGNOSIS — J069 Acute upper respiratory infection, unspecified: Secondary | ICD-10-CM

## 2016-01-02 NOTE — Progress Notes (Signed)
BP 126/86 mmHg  Pulse 78  Temp(Src) 99.2 F (37.3 C)  Ht 5\' 8"  (1.727 m)  Wt 251 lb (113.853 kg)  BMI 38.17 kg/m2  SpO2 98%  LMP 06/01/2015   Subjective:    Patient ID: Emily [name], female    DOB: Jun 22, 1981, 35 y.o.   MRN: [id number]  HPI: Emily [name] is a 35 y.o. female  Chief Complaint  Patient presents with  . URI    X 2 days   UPPER RESPIRATORY TRACT INFECTION Duration: 2 days Worst symptom: sore throat Fever: no Cough: yes Shortness of breath: no Wheezing: no Chest pain: no Chest tightness: no Chest congestion: no Nasal congestion: yes Runny nose: yes Post nasal drip: yes Sneezing: yes Sore throat: yes Swollen glands: yes Sinus pressure: no Headache: yes, start in the forehead that goes to her ears Face pain: no Toothache: no Ear pain: yes  Ear pressure: yes bilateral Eyes red/itching:no Eye drainage/crusting: no  Vomiting: no Rash: no Fatigue: yes Sick contacts: yes Strep contacts: no  Context: stable Recurrent sinusitis: no Relief with OTC cold/cough medications: no  Treatments attempted: ibuprofen    Relevant past medical, surgical, family and social history reviewed and updated as indicated. Interim medical history since our last visit reviewed. Allergies and medications reviewed and updated.  Review of Systems  Constitutional: Positive for fatigue. Negative for fever, chills, diaphoresis, activity change, appetite change and unexpected weight change.  HENT: Positive for congestion, ear pain, postnasal drip, rhinorrhea, sinus pressure, sneezing and sore throat. Negative for dental problem, drooling, ear discharge, facial swelling, hearing loss, mouth sores, nosebleeds, tinnitus, trouble swallowing and voice change.   Eyes: Negative.   Respiratory: Negative for apnea, cough, choking, chest tightness, shortness of breath, wheezing and stridor.   Cardiovascular: Negative.   Musculoskeletal: Positive for myalgias.  Psychiatric/Behavioral:  Negative.     Per HPI unless specifically indicated above     Objective:    BP 126/86 mmHg  Pulse 78  Temp(Src) 99.2 F (37.3 C)  Ht 5\' 8"  (1.727 m)  Wt 251 lb (113.853 kg)  BMI 38.17 kg/m2  SpO2 98%  LMP 06/01/2015  Wt Readings from Last 3 Encounters:  01/02/16 251 lb (113.853 kg)  12/27/15 249 lb 7 oz (113.144 kg)  12/26/15 249 lb (112.946 kg)    Physical Exam  Constitutional: She is oriented to person, place, and time. She appears well-developed and well-nourished. No distress.  HENT:  Head: Normocephalic and atraumatic.  Right Ear: Hearing and external ear normal.  Left Ear: Hearing and external ear normal.  Nose: Nose normal.  Mouth/Throat: Uvula is midline and mucous membranes are normal. Posterior oropharyngeal edema and posterior oropharyngeal erythema present. No oropharyngeal exudate or tonsillar abscesses.  Eyes: Conjunctivae, EOM and lids are normal. Pupils are equal, round, and reactive to light. Right eye exhibits no discharge. Left eye exhibits no discharge. No scleral icterus.  Neck: Normal range of motion. Neck supple. No JVD present. No tracheal deviation present. No thyromegaly present.  Cardiovascular: Normal rate, regular rhythm, normal heart sounds and intact distal pulses.  Exam reveals no gallop and no friction rub.   No murmur heard. Pulmonary/Chest: Effort normal and breath sounds normal. No stridor. No respiratory distress. She has no wheezes. She has no rales. She exhibits no tenderness.  Musculoskeletal: Normal range of motion.  Lymphadenopathy:    She has cervical adenopathy.  Neurological: She is alert and oriented to person, place, and time.  Skin: Skin is warm, dry and  intact. No rash noted. She is not diaphoretic. No erythema. No pallor.  Psychiatric: She has a normal mood and affect. Her speech is normal and behavior is normal. Judgment and thought content normal. Cognition and memory are normal.  Nursing note and vitals  reviewed.   Results for orders placed or performed in visit on 11/29/15  CBC  Result Value Ref Range   WBC 8.7 3.4 - 10.8 x10E3/uL   RBC 4.74 3.77 - 5.28 x10E6/uL   Hemoglobin 13.7 11.1 - 15.9 g/dL   Hematocrit 16.1 09.6 - 46.6 %   MCV 87 79 - 97 fL   MCH 28.9 26.6 - 33.0 pg   MCHC 33.3 31.5 - 35.7 g/dL   RDW 04.5 40.9 - 81.1 %   Platelets 329 150 - 379 x10E3/uL  Thyroid Panel With TSH  Result Value Ref Range   TSH 3.310 0.450 - 4.500 uIU/mL   T4, Total 7.2 4.5 - 12.0 ug/dL   T3 Uptake Ratio 25 24 - 39 %   Free Thyroxine Index 1.8 1.2 - 4.9  Comprehensive metabolic panel  Result Value Ref Range   Glucose 82 65 - 99 mg/dL   BUN 7 6 - 20 mg/dL   Creatinine, Ser 9.14 0.57 - 1.00 mg/dL   GFR calc non Af Amer 87 >59 mL/min/1.73   GFR calc Af Amer 100 >59 mL/min/1.73   BUN/Creatinine Ratio 8 8 - 20   Sodium 140 134 - 144 mmol/L   Potassium 4.8 3.5 - 5.2 mmol/L   Chloride 101 96 - 106 mmol/L   CO2 24 18 - 29 mmol/L   Calcium 9.7 8.7 - 10.2 mg/dL   Total Protein 7.3 6.0 - 8.5 g/dL   Albumin 4.2 3.5 - 5.5 g/dL   Globulin, Total 3.1 1.5 - 4.5 g/dL   Albumin/Globulin Ratio 1.4 1.1 - 2.5   Bilirubin Total 1.1 0.0 - 1.2 mg/dL   Alkaline Phosphatase 93 39 - 117 IU/L   AST 22 0 - 40 IU/L   ALT 28 0 - 32 IU/L  Iron  Result Value Ref Range   Iron 94 27 - 159 ug/dL  Vitamin D (25 hydroxy)  Result Value Ref Range   Vit D, 25-Hydroxy 19.9 (L) 30.0 - 100.0 ng/mL  B12  Result Value Ref Range   Vitamin B-12 268 211 - 946 pg/mL      Assessment & Plan:   Problem List Items Addressed This Visit    None    Visit Diagnoses    Viral URI    -  Primary    OTC medicine for comfort. Call if not getting better in 5-7 days. Continue to monitor.     Other specified fever        Negative strep and flu. Likely viral URI.    Relevant Orders    Veritor Flu A/B Waived    Rapid strep screen (not at [facility])        Follow up plan: Return if symptoms worsen or fail to improve.

## 2016-01-04 LAB — VERITOR FLU A/B WAIVED
INFLUENZA B: NEGATIVE
Influenza A: NEGATIVE

## 2016-01-04 LAB — CULTURE, GROUP A STREP: Strep A Culture: NEGATIVE

## 2016-01-04 LAB — RAPID STREP SCREEN (MED CTR MEBANE ONLY): Strep Gp A Ag, IA W/Reflex: NEGATIVE

## 2016-01-24 ENCOUNTER — Ambulatory Visit (INDEPENDENT_AMBULATORY_CARE_PROVIDER_SITE_OTHER): Payer: Managed Care, Other (non HMO) | Admitting: Obstetrics and Gynecology

## 2016-01-24 VITALS — BP 121/90 | HR 75 | Wt 245.2 lb

## 2016-01-24 DIAGNOSIS — E669 Obesity, unspecified: Secondary | ICD-10-CM | POA: Diagnosis not present

## 2016-01-24 MED ORDER — CYANOCOBALAMIN 1000 MCG/ML IJ SOLN
1000.0000 ug | Freq: Once | INTRAMUSCULAR | Status: AC
Start: 2016-01-24 — End: 2016-01-24
  Administered 2016-01-24: 1000 ug via INTRAMUSCULAR

## 2016-01-24 NOTE — Progress Notes (Signed)
Patient ID: Emily [name], female   DOB: July 26, 1981, 35 y.o.   MRN: [id number] Pt presents for weight, B/P, B-12 injection. No side effects of medication-Phentermine, or B-12.  Weight loss of  4 lbs. Encouraged eating healthy and exercise.

## 2016-02-23 ENCOUNTER — Encounter: Payer: Self-pay | Admitting: Obstetrics and Gynecology

## 2016-02-23 ENCOUNTER — Ambulatory Visit (INDEPENDENT_AMBULATORY_CARE_PROVIDER_SITE_OTHER): Payer: Managed Care, Other (non HMO) | Admitting: Obstetrics and Gynecology

## 2016-02-23 VITALS — BP 115/79 | HR 96 | Ht 69.0 in | Wt 244.8 lb

## 2016-02-23 DIAGNOSIS — R102 Pelvic and perineal pain: Secondary | ICD-10-CM

## 2016-02-23 DIAGNOSIS — R5383 Other fatigue: Secondary | ICD-10-CM | POA: Diagnosis not present

## 2016-02-23 DIAGNOSIS — E559 Vitamin D deficiency, unspecified: Secondary | ICD-10-CM | POA: Diagnosis not present

## 2016-02-23 NOTE — Progress Notes (Signed)
Subjective:     Patient ID: Emily BeamEmily N [name], female   DOB: 12/01/1980, 35 y.o.   MRN: [id number]  HPI Reports sudden onset of fatigue 6 weeks ago, denies changes in activity or recent illness; no new meds. Has been still exercising to prepare for 5K Also reports return of lower pelvic pains x 2 weeks c/w the pain she has had previously with ovarian cyst. Denies bleeding or pain with sex, or dysuria. No fever or other s/s  Review of Systems See above    Objective:   Physical Exam A&O x4  well groomed female in no dsitress Blood pressure 115/79, pulse 96, height 5\' 9"  (1.753 m), weight 244 lb 12.8 oz (111.041 kg), last menstrual period 06/01/2015. Abdomen soft with slight tenderness in bilateral lower quadrants Pelvic exam: VULVA: normal appearing vulva with no masses, tenderness or lesions, VAGINA: normal appearing vagina with normal color and discharge, no lesions, ADNEXA: tenderness bilateral, no masses.    Assessment:     Fatigue Pelvic pain H/o ovarian cyst S/p hysterectomy  Obesity Vitamin d deficiency     Plan:     Labs obtained Ordered pelvic ultrasound- will follow up accordingly Will wait on restarting weight loss med until ordered tests results are in.  Zavien Clubb [place], CNM

## 2016-02-24 ENCOUNTER — Other Ambulatory Visit: Payer: Self-pay | Admitting: Obstetrics and Gynecology

## 2016-02-24 ENCOUNTER — Ambulatory Visit (INDEPENDENT_AMBULATORY_CARE_PROVIDER_SITE_OTHER): Payer: Managed Care, Other (non HMO)

## 2016-02-24 DIAGNOSIS — R102 Pelvic and perineal pain: Secondary | ICD-10-CM

## 2016-02-24 LAB — CBC WITH DIFFERENTIAL/PLATELET
BASOS: 0 %
Basophils Absolute: 0 10*3/uL (ref 0.0–0.2)
EOS (ABSOLUTE): 0 10*3/uL (ref 0.0–0.4)
EOS: 0 %
HEMATOCRIT: 41.5 % (ref 34.0–46.6)
Hemoglobin: 13.8 g/dL (ref 11.1–15.9)
IMMATURE GRANS (ABS): 0 10*3/uL (ref 0.0–0.1)
IMMATURE GRANULOCYTES: 0 %
LYMPHS: 29 %
Lymphocytes Absolute: 3 10*3/uL (ref 0.7–3.1)
MCH: 29.4 pg (ref 26.6–33.0)
MCHC: 33.3 g/dL (ref 31.5–35.7)
MCV: 88 fL (ref 79–97)
Monocytes Absolute: 1.1 10*3/uL — ABNORMAL HIGH (ref 0.1–0.9)
Monocytes: 10 %
NEUTROS PCT: 61 %
Neutrophils Absolute: 6.5 10*3/uL (ref 1.4–7.0)
Platelets: 338 10*3/uL (ref 150–379)
RBC: 4.7 x10E6/uL (ref 3.77–5.28)
RDW: 13.4 % (ref 12.3–15.4)
WBC: 10.6 10*3/uL (ref 3.4–10.8)

## 2016-02-24 LAB — URINALYSIS, MICROSCOPIC ONLY: CASTS: NONE SEEN /LPF

## 2016-02-24 LAB — TSH: TSH: 2.46 u[IU]/mL (ref 0.450–4.500)

## 2016-02-24 LAB — VITAMIN D 25 HYDROXY (VIT D DEFICIENCY, FRACTURES): VIT D 25 HYDROXY: 47.7 ng/mL (ref 30.0–100.0)

## 2016-02-27 LAB — CULTURE, URINE COMPREHENSIVE

## 2016-02-28 ENCOUNTER — Other Ambulatory Visit: Payer: Self-pay | Admitting: Obstetrics and Gynecology

## 2016-02-28 MED ORDER — NITROFURANTOIN MONOHYD MACRO 100 MG PO CAPS: 100.0000 mg | ORAL_CAPSULE | Freq: Two times a day (BID) | ORAL | Status: DC

## 2016-03-01 ENCOUNTER — Encounter: Payer: Self-pay | Admitting: Obstetrics and Gynecology

## 2016-03-08 ENCOUNTER — Telehealth: Payer: Self-pay | Admitting: Obstetrics and Gynecology

## 2016-03-08 ENCOUNTER — Ambulatory Visit (INDEPENDENT_AMBULATORY_CARE_PROVIDER_SITE_OTHER): Payer: Managed Care, Other (non HMO) | Admitting: Obstetrics and Gynecology

## 2016-03-08 VITALS — BP 113/78 | HR 73 | Wt 245.6 lb

## 2016-03-08 DIAGNOSIS — E669 Obesity, unspecified: Secondary | ICD-10-CM | POA: Diagnosis not present

## 2016-03-08 MED ORDER — CYANOCOBALAMIN 1000 MCG/ML IJ SOLN
1000.0000 ug | Freq: Once | INTRAMUSCULAR | Status: AC
  Administered 2016-03-08: 1000 ug via INTRAMUSCULAR

## 2016-03-08 NOTE — Telephone Encounter (Signed)
THIS PT SAID SHE NEEDED TO GET BACK ON PHENTERMINE AFTER HER ANTIBIOTIC, SHE CAME FOR NURSE VISIT 6/15. SHE SAW YOU 6/1. CAN YOU SEND IN RX FOR PHENTERMINE.

## 2016-03-08 NOTE — Progress Notes (Signed)
Patient ID: Emily [name], female   DOB: 04/12/1981, 35 y.o.   MRN: [id number] Pt presents for weight, B/P, B-12 injection. No side effects of medication-Phentermine, or B-12.  Weight gain of 1/2 lbs. Encouraged eating healthy and exercise. Pt has been sick and on ATB. Starting to feel better. Pt's own B-12 from pharmacy expired on so in house was used.

## 2016-03-08 NOTE — Telephone Encounter (Signed)
It is fine to refill.

## 2016-03-08 NOTE — Telephone Encounter (Signed)
RX for phentermine called to pharmacy with 2 refills. Pt notified.

## 2016-03-29 ENCOUNTER — Encounter: Payer: Self-pay | Admitting: Family Medicine

## 2016-03-29 ENCOUNTER — Ambulatory Visit (INDEPENDENT_AMBULATORY_CARE_PROVIDER_SITE_OTHER): Payer: Managed Care, Other (non HMO) | Admitting: Family Medicine

## 2016-03-29 VITALS — BP 118/85 | HR 81 | Temp 98.3°F | Ht 69.0 in | Wt 245.0 lb

## 2016-03-29 DIAGNOSIS — Z0289 Encounter for other administrative examinations: Secondary | ICD-10-CM

## 2016-03-29 NOTE — Patient Instructions (Signed)
Call or schedule a visit if you have any questions or concerns.

## 2016-03-29 NOTE — Progress Notes (Signed)
BP 118/85 mmHg  Pulse 81  Temp(Src) 98.3 F (36.8 C)  Ht 5\' 9"  (1.753 m)  Wt 245 lb (111.131 kg)  BMI 36.16 kg/m2  SpO2 99%  LMP 06/01/2015   Subjective:    Patient ID: Emily [name], female    DOB: [birth year], 35 y.o.   MRN: [id number]  HPI: Emily [name] is a 35 y.o. female  Chief Complaint  Patient presents with  . 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 She is feeling well today and in her usual state of health.   Relevant past medical, surgical, family and social history reviewed and updated as indicated. Interim medical history since our last visit reviewed. Allergies and medications reviewed and updated.  Review of Systems  Constitutional: Negative.   HENT: Negative.   Eyes: Negative.   Respiratory: Negative.   Cardiovascular: Negative.   Gastrointestinal: Negative.   Genitourinary: Negative.   Musculoskeletal: Negative.   Skin: Negative.   Allergic/Immunologic: Negative.   Neurological: Negative.   Psychiatric/Behavioral: Negative.     Per HPI unless specifically indicated above     Objective:    BP 118/85 mmHg  Pulse 81  Temp(Src) 98.3 F (36.8 C)  Ht 5\' 9"  (1.753 m)  Wt 245 lb (111.131 kg)  BMI 36.16 kg/m2  SpO2 99%  LMP 06/01/2015  Wt Readings from Last 3 Encounters:  03/29/16 245 lb (111.131 kg)  03/08/16 245 lb 9 oz (111.386 kg)  02/23/16 244 lb 12.8 oz (111.041 kg)    Physical Exam  Constitutional: She is oriented to person, place, and time. She appears well-developed and well-nourished. No distress.  HENT:  Head: Atraumatic.  Right Ear: External ear normal.  Left Ear: External ear normal.  Nose: Nose normal.  Mouth/Throat: Oropharynx is clear and moist. No oropharyngeal exudate.  Neck: Normal range of motion. Neck supple. No tracheal deviation present. No thyromegaly present.  Cardiovascular: Normal rate, regular rhythm and normal heart sounds.    Pulmonary/Chest: Effort normal and breath sounds normal. No respiratory distress.  Abdominal: Soft. Bowel sounds are normal. She exhibits no distension and no mass. There is no tenderness. There is no guarding.  Musculoskeletal: Normal range of motion. She exhibits no edema or tenderness.  Lymphadenopathy:    She has no cervical adenopathy.  Neurological: She is alert and oriented to person, place, and time. She has normal reflexes.  Skin: Skin is warm and dry. No rash noted.  Psychiatric: She has a normal mood and affect. Her behavior is normal.  Nursing note and vitals reviewed.   Results for orders placed or performed in visit on 02/23/16  CULTURE, URINE COMPREHENSIVE  Result Value Ref Range   Urine Culture, Comprehensive Final report (A)    Result 1 Escherichia coli (A)    ANTIMICROBIAL SUSCEPTIBILITY Comment   Vitamin D (25 hydroxy)  Result Value Ref Range   Vit D, 25-Hydroxy 47.7 30.0 - 100.0 ng/mL  Urine Microscopic Only  Result Value Ref Range   WBC, UA 11-30 (A) 0 -  5 /hpf   RBC, UA 0-2 0 -  2 /hpf   Epithelial Cells (non renal) 0-10 0 - 10 /hpf   Casts None seen None seen /lpf   Crystals Present (A) N/A   Crystal Type Amorphous Sediment N/A   Mucus, UA Present Not Estab.   Bacteria, UA Moderate (A) None seen/Few  CBC with Differential  Result  Value Ref Range   WBC 10.6 3.4 - 10.8 x10E3/uL   RBC 4.70 3.77 - 5.28 x10E6/uL   Hemoglobin 13.8 11.1 - 15.9 g/dL   Hematocrit 16.141.5 09.634.0 - 46.6 %   MCV 88 79 - 97 fL   MCH 29.4 26.6 - 33.0 pg   MCHC 33.3 31.5 - 35.7 g/dL   RDW 04.513.4 40.912.3 - 81.115.4 %   Platelets 338 150 - 379 x10E3/uL   Neutrophils 61 %   Lymphs 29 %   Monocytes 10 %   Eos 0 %   Basos 0 %   Neutrophils Absolute 6.5 1.4 - 7.0 x10E3/uL   Lymphocytes Absolute 3.0 0.7 - 3.1 x10E3/uL   Monocytes Absolute 1.1 (H) 0.1 - 0.9 x10E3/uL   EOS (ABSOLUTE) 0.0 0.0 - 0.4 x10E3/uL   Basophils Absolute 0.0 0.0 - 0.2 x10E3/uL   Immature Granulocytes 0 %   Immature Grans  (Abs) 0.0 0.0 - 0.1 x10E3/uL  TSH  Result Value Ref Range   TSH 2.460 0.450 - 4.500 uIU/mL      Assessment & Plan:   Problem List Items Addressed This Visit    None    Visit Diagnoses    Encounter for camp admission history and physical    -  Primary    Paperwork filled out today, copy was made to scan.        Her medical conditions are stable and well controlled with current medication regimen.   Follow up plan: Return if symptoms worsen or fail to improve.

## 2016-04-05 ENCOUNTER — Ambulatory Visit (INDEPENDENT_AMBULATORY_CARE_PROVIDER_SITE_OTHER): Payer: Managed Care, Other (non HMO) | Admitting: Obstetrics and Gynecology

## 2016-04-05 VITALS — BP 109/84 | HR 68 | Wt 245.2 lb

## 2016-04-05 DIAGNOSIS — E669 Obesity, unspecified: Secondary | ICD-10-CM

## 2016-04-05 MED ORDER — CYANOCOBALAMIN 1000 MCG/ML IJ SOLN
1000.0000 ug | Freq: Once | INTRAMUSCULAR | Status: AC
  Administered 2016-04-05: 1000 ug via INTRAMUSCULAR

## 2016-04-05 NOTE — Progress Notes (Signed)
Patient ID: Emily [name], female   DOB: November 09, 1980, 35 y.o.   MRN: [id number] Pt presents for weight, B/P, B-12 injection. No side effects of medication-Phentermine, or B-12.  Weight stable at 245 lbs.  Encouraged eating healthy and exercise.

## 2016-04-18 ENCOUNTER — Ambulatory Visit
Admission: RE | Admit: 2016-04-18 | Discharge: 2016-04-18 | Disposition: A | Discharge status: Home / Self Care | Payer: Managed Care, Other (non HMO) | Source: Ambulatory Visit | Attending: Family Medicine | Admitting: Family Medicine

## 2016-04-18 ENCOUNTER — Encounter: Payer: Self-pay | Admitting: Family Medicine

## 2016-04-18 ENCOUNTER — Ambulatory Visit (INDEPENDENT_AMBULATORY_CARE_PROVIDER_SITE_OTHER): Payer: Managed Care, Other (non HMO) | Admitting: Family Medicine

## 2016-04-18 ENCOUNTER — Telehealth: Payer: Self-pay | Admitting: Family Medicine

## 2016-04-18 VITALS — BP 119/80 | HR 90 | Temp 98.4°F | Wt 248.0 lb

## 2016-04-18 DIAGNOSIS — R300 Dysuria: Secondary | ICD-10-CM | POA: Diagnosis not present

## 2016-04-18 DIAGNOSIS — R1031 Right lower quadrant pain: Secondary | ICD-10-CM | POA: Diagnosis not present

## 2016-04-18 LAB — CBC WITH DIFFERENTIAL/PLATELET
Hematocrit: 42 % (ref 34.0–46.6)
Hemoglobin: 14.5 g/dL (ref 11.1–15.9)
LYMPHS: 32 %
Lymphocytes Absolute: 2.6 10*3/uL (ref 0.7–3.1)
MCH: 30.9 pg (ref 26.6–33.0)
MCHC: 34.5 g/dL (ref 31.5–35.7)
MCV: 89 fL (ref 79–97)
MID (Absolute): 0.8 10*3/uL (ref 0.1–1.6)
MID: 10 %
NEUTROS ABS: 4.7 10*3/uL (ref 1.4–7.0)
NEUTROS PCT: 58 %
PLATELETS: 291 10*3/uL (ref 150–379)
RBC: 4.7 x10E6/uL (ref 3.77–5.28)
RDW: 14 % (ref 12.3–15.4)
WBC: 8.1 10*3/uL (ref 3.4–10.8)

## 2016-04-18 LAB — UA/M W/RFLX CULTURE, ROUTINE
Bilirubin, UA: NEGATIVE
GLUCOSE, UA: NEGATIVE
KETONES UA: NEGATIVE
LEUKOCYTES UA: NEGATIVE
Nitrite, UA: NEGATIVE
Protein, UA: NEGATIVE
RBC, UA: NEGATIVE
Specific Gravity, UA: 1.02 (ref 1.005–1.030)
UUROB: 0.2 mg/dL (ref 0.2–1.0)
pH, UA: 5.5 (ref 5.0–7.5)

## 2016-04-18 MED ORDER — IOPAMIDOL (ISOVUE-300) INJECTION 61%: 100.0000 mL | Freq: Once | INTRAVENOUS | Status: DC | PRN

## 2016-04-18 NOTE — Telephone Encounter (Signed)
Please let her know that her CT scan came back normal. Will let her know if the urine culture comes back with anything in the next day or so.

## 2016-04-18 NOTE — Progress Notes (Signed)
BP 119/80   Pulse 90   Temp 98.4 F (36.9 C)   Wt 248 lb (112.5 kg)   LMP 06/01/2015   SpO2 100%   BMI 36.62 kg/m    Subjective:    Patient ID: Emily [name], female    DOB: 10-30-1980, 35 y.o.   MRN: [id number]  HPI: Emily [name] is a 35 y.o. female  Chief Complaint  Patient presents with  . Urinary Tract Infection    Started Monday. Urinary frequency, some burning, sharp pain and cramping in LRQ. Some difficuly with bowel movements which is normal for her with urinary issues.     Urinary frequency, dysuria, stabbing RLQ pain, constipation, fatigue x 2 days. Symptoms are constant and not worsening. RLQ pain is intermittent, sometimes triggered by having to void but other times for seemingly no reason at all. 6/10 pain, minimally relieved by 800 mg ibuprofen. Denies fever, chills, N/V/D, hematochezia. Has noticed decreased appetite, though she is keeping food down fine.   Has hx of ovarian cysts that have been aspirated, and is s/p hysterectomy. Also has hx of several UTIs.    Relevant past medical, surgical, family and social history reviewed and updated as indicated. Interim medical history since our last visit reviewed. Allergies and medications reviewed and updated.  Review of Systems  Constitutional: Positive for appetite change and fatigue. Negative for chills and fever.  HENT: Negative.   Eyes: Negative.   Respiratory: Negative.   Cardiovascular: Negative.   Gastrointestinal: Positive for abdominal pain and constipation.  Genitourinary: Positive for dysuria, frequency, pelvic pain and urgency.  Musculoskeletal: Negative.   Neurological: Negative.   Psychiatric/Behavioral: Negative.     Per HPI unless specifically indicated above     Objective:    BP 119/80   Pulse 90   Temp 98.4 F (36.9 C)   Wt 248 lb (112.5 kg)   LMP 06/01/2015   SpO2 100%   BMI 36.62 kg/m   Wt Readings from Last 3 Encounters:  04/18/16 248 lb (112.5 kg)  04/05/16 245 lb 3 oz  (111.2 kg)  03/29/16 245 lb (111.1 kg)    Physical Exam  Constitutional: She is oriented to person, place, and time. She appears well-developed and well-nourished.  HENT:  Head: Atraumatic.  Eyes: Conjunctivae are normal. No scleral icterus.  Neck: Normal range of motion. Neck supple.  Cardiovascular: Normal rate.   Pulmonary/Chest: Effort normal.  Abdominal: Soft. Bowel sounds are normal. She exhibits no distension. There is tenderness (RLQ TTP). There is no rebound and no guarding.  - rosvings sign, - psoas sign  Musculoskeletal: Normal range of motion.  Neurological: She is alert and oriented to person, place, and time.  Skin: Skin is warm and dry.  Psychiatric: She has a normal mood and affect. Her behavior is normal.  Nursing note and vitals reviewed.   U/A and CBC WNL in office.    Assessment & Plan:   Problem List Items Addressed This Visit    None    Visit Diagnoses    Right lower quadrant abdominal pain    -  Primary   Relevant Orders   CBC With Differential/Platelet   CT Abdomen Pelvis W Contrast   Dysuria       Relevant Orders   UA/M w/rflx Culture, Routine (STAT)    Vitals and labs reassuring, but cannot r/o an early appendicitis or a symptomatic ovarian cyst. Will set up CT abdomen pelvis for later on today and await urine cx.  Patient to continue ibuprofen as needed until then. Instructed not to eat or drink until after imaging is completed.   Instructed pt to go to ER if any severe worsening of condition prior to getting CT scheduled.    Follow up plan: Return if symptoms worsen or fail to improve.

## 2016-04-18 NOTE — Telephone Encounter (Signed)
Patient notified

## 2016-04-18 NOTE — Patient Instructions (Signed)
Follow up for worsening or persistent symptoms

## 2016-05-08 IMAGING — CT CT ABD-PELV W/ CM
1 of 2 series · 15 of 32 positions shown, 19 images · IV contrast (omnipaque)
Comparison: None.

CLINICAL DATA: Left greater than right lower abdominal pain 10
days. Nausea and diarrhea. Previous tubal ligation.

EXAM:
CT ABDOMEN AND PELVIS WITH CONTRAST
TECHNIQUE: Multidetector CT imaging of the abdomen and pelvis was performed
using the standard protocol following bolus administration of
intravenous contrast.
CONTRAST:  100mL OMNIPAQUE IOHEXOL 350 MG/ML SOLN

[Series 2: routine abd pel with · axial · 0.77mm/px · z∈[+220,+680]mm · 15 of 102 slices shown, 19 images]
[im 5/102  soft-tissue]
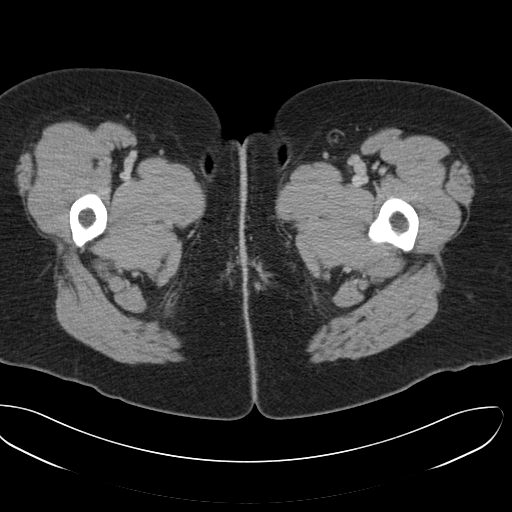
[im 5/102  bone]
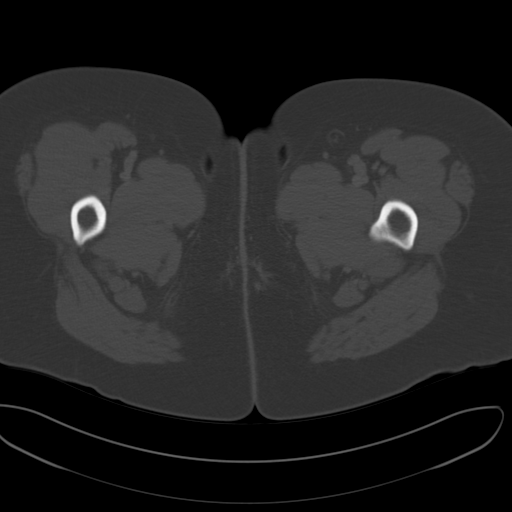
[im 13/102  soft-tissue]
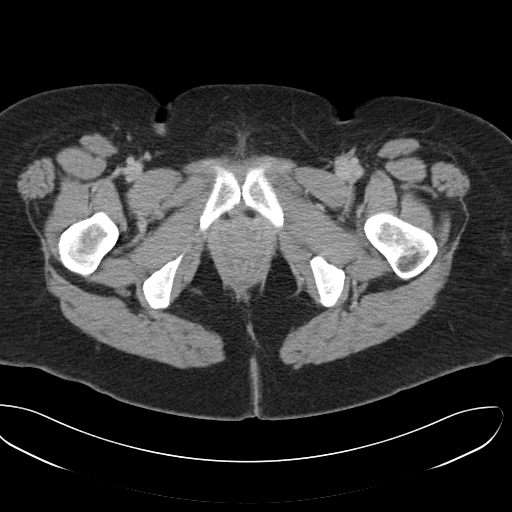
[im 21/102  soft-tissue]
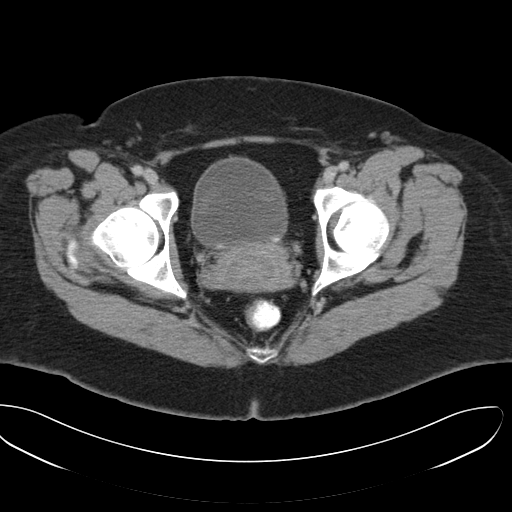
[im 29/102  soft-tissue]
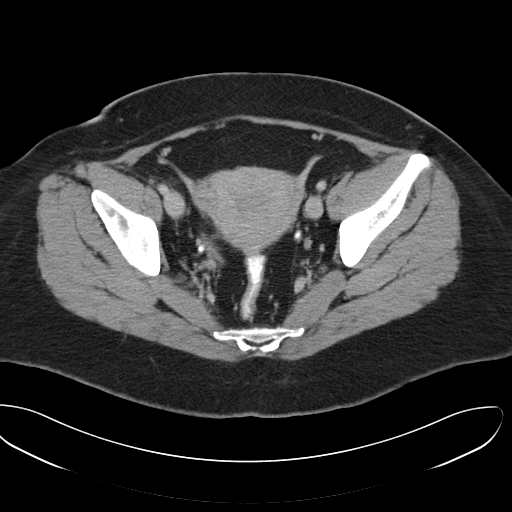
[im 37/102  soft-tissue]
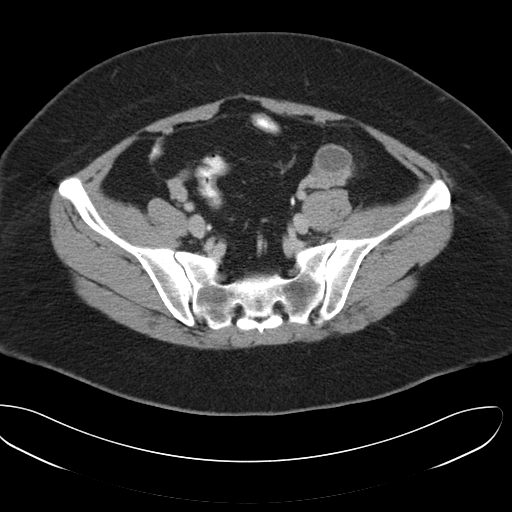
[im 45/102  soft-tissue]
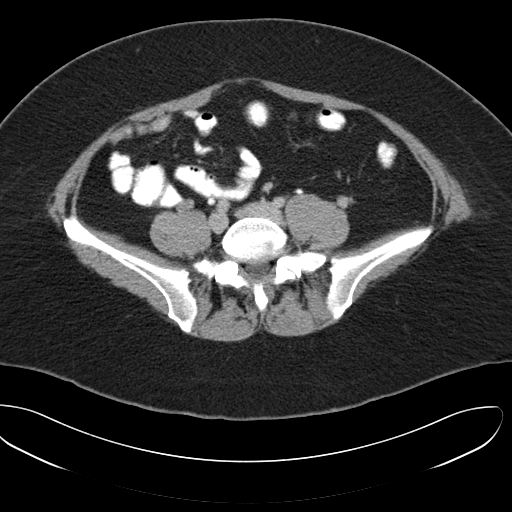
[im 53/102  soft-tissue]
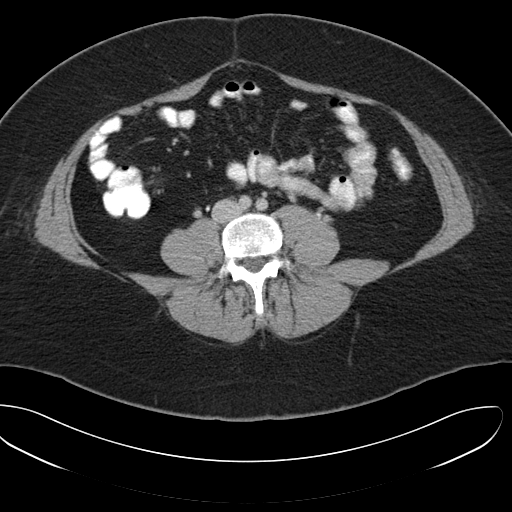
[im 57/102  soft-tissue]
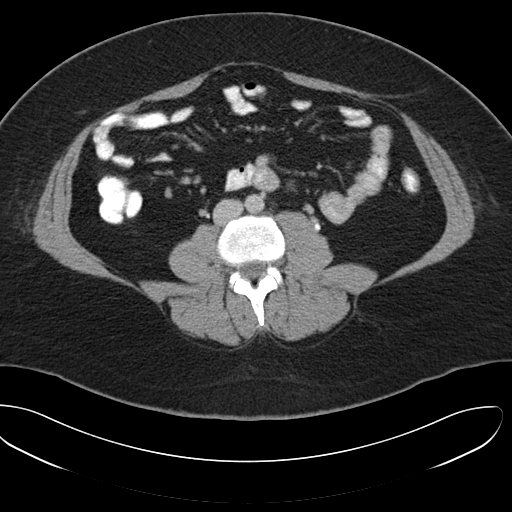
[im 65/102  soft-tissue]
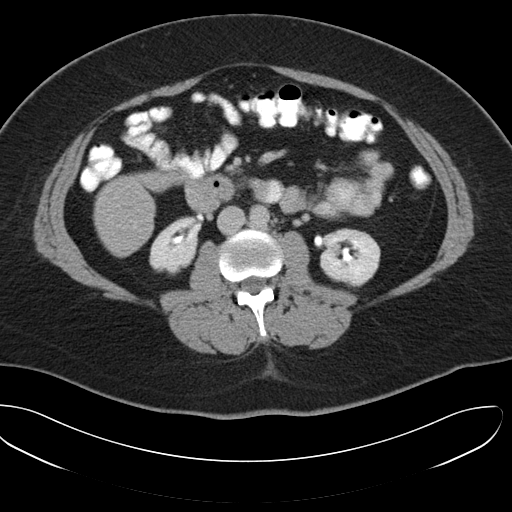
[im 65/102  bone]
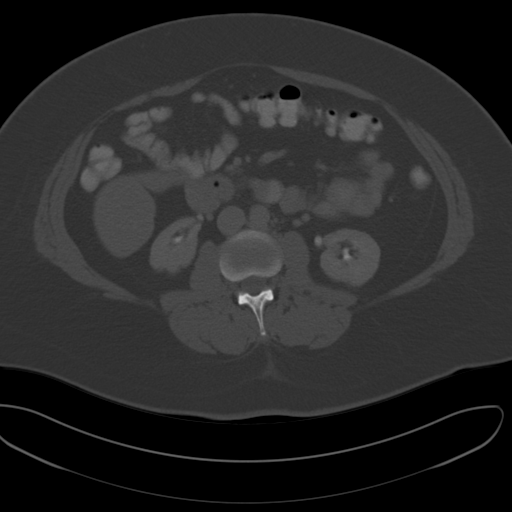
[im 73/102  soft-tissue]
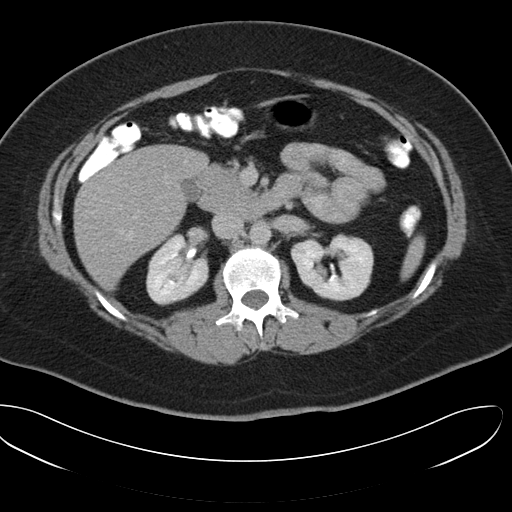
[im 81/102  soft-tissue]
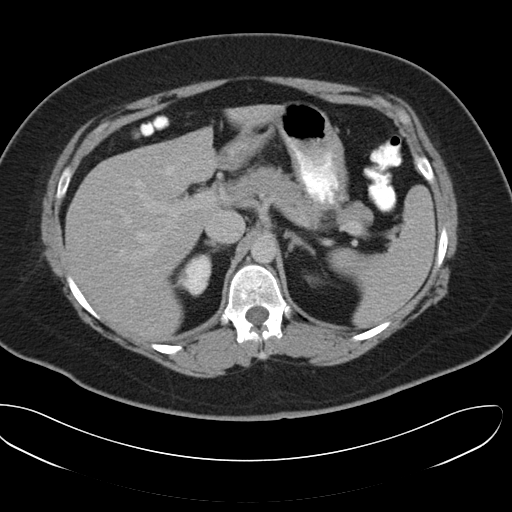
[im 85/102  lung]
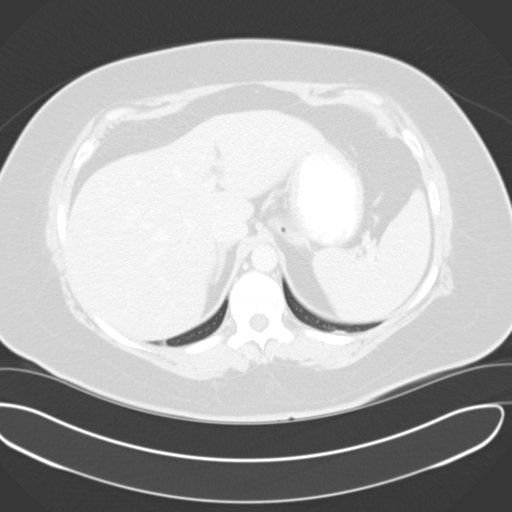
[im 89/102  soft-tissue]
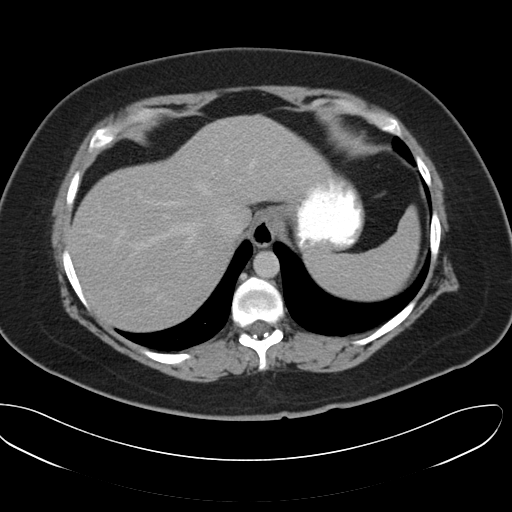
[im 89/102  lung]
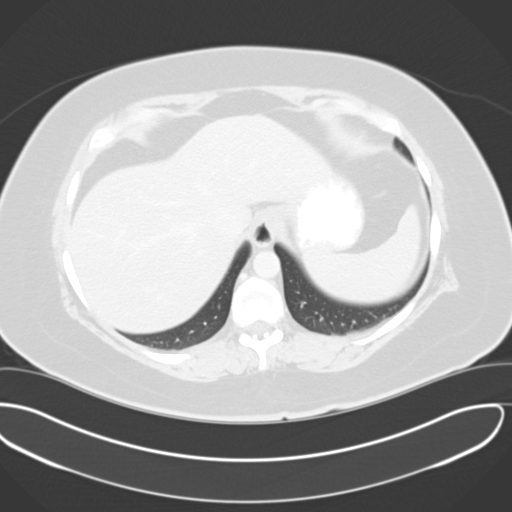
[im 93/102  lung]
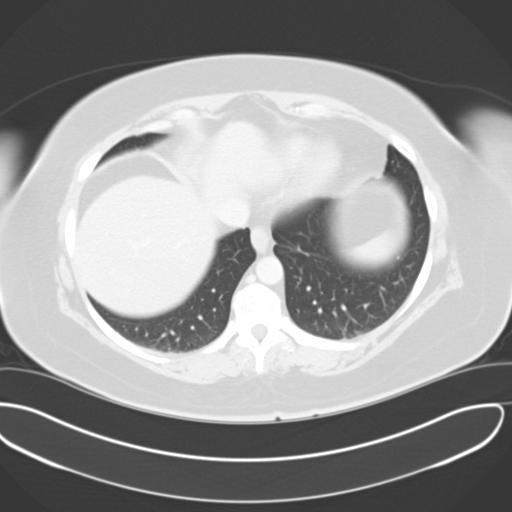
[im 97/102  soft-tissue]
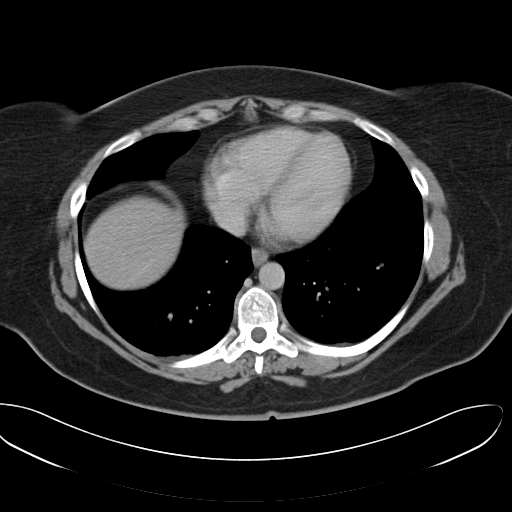
[im 97/102  lung]
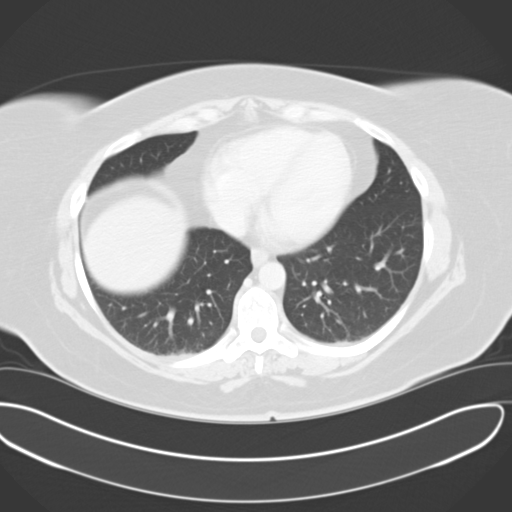

[15 of 32 positions shown; findings below may reference images not displayed]

FINDINGS: Lung bases are within normal.

Abdominal images demonstrate a normal liver, spleen, pancreas,
gallbladder and adrenal glands. Vascular structures are within
normal. Appendix is normal.

Kidneys are normal in size without hydronephrosis. There is a sub cm
hypodensity over the mid pole cortex of the right kidney too small
to characterize but likely a cyst. Ureters are within normal.

Mesentery is within normal. There is no free fluid or focal
inflammatory change.

Small bowel and colon are within normal.

Possible tiny midline ventral hernia just above the umbilicus
containing only peritoneal fat.

Pelvic images demonstrate a 2.5 cm left ovarian cyst. Right ovary is
within normal. The bladder, uterus and rectum are normal. Surgical
clip from previous tubal ligation is appropriately positioned over
the expected region of the right fallopian tube although the second
clip lies over the region of the cul-de-sac.

Remaining bones and soft tissues are within normal.
IMPRESSION: No acute findings in the abdomen/pelvis.

Predominately simple appearing 2.5 cm left ovarian cystic process
likely a follicular cyst.

Possible tiny midline periumbilical hernia containing only
peritoneal fat.

The left-sided surgical clip from patient's bilateral tubal ligation
lies over the cul-de-sac 5-6 cm from the expected region of the left
fallopian tube.

Sub cm right renal cortical hypodensity likely a cyst but too small
to characterize.

## 2016-05-15 ENCOUNTER — Ambulatory Visit (INDEPENDENT_AMBULATORY_CARE_PROVIDER_SITE_OTHER): Payer: Managed Care, Other (non HMO) | Admitting: Obstetrics and Gynecology

## 2016-05-15 ENCOUNTER — Encounter: Payer: Self-pay | Admitting: Obstetrics and Gynecology

## 2016-05-15 VITALS — BP 107/79 | HR 74 | Ht 69.0 in | Wt 250.6 lb

## 2016-05-15 DIAGNOSIS — Z79899 Other long term (current) drug therapy: Secondary | ICD-10-CM | POA: Diagnosis not present

## 2016-05-15 NOTE — Progress Notes (Signed)
SUBJECTIVE:  35 y.o. here for follow-up weight loss visit, previously seen 4 weeks ago. Denies any concerns and feels like medication isn't working. Did not loss any weight and states she is exercising regularly and cleaned her eating habits up.  OBJECTIVE:  BP 107/79   Pulse 74   Ht 5\' 9"  (1.753 m)   Wt 250 lb 9.6 oz (113.7 kg)   LMP 06/01/2015   BMI 37.01 kg/m   Body mass index is 37.01 kg/m. Patient appears well. ASSESSMENT:  Obesity- not responding well to weight loss plan PLAN: will stop adipex at this time. Encouraged continued exercise and healthy eating. RTC as needed.     Tomio Kirk [place], CNM

## 2016-05-29 ENCOUNTER — Ambulatory Visit (INDEPENDENT_AMBULATORY_CARE_PROVIDER_SITE_OTHER): Payer: Managed Care, Other (non HMO) | Admitting: Family Medicine

## 2016-05-29 ENCOUNTER — Encounter: Payer: Self-pay | Admitting: Family Medicine

## 2016-05-29 VITALS — BP 118/82 | HR 75 | Temp 98.4°F | Wt 248.0 lb

## 2016-05-29 DIAGNOSIS — J069 Acute upper respiratory infection, unspecified: Secondary | ICD-10-CM | POA: Diagnosis not present

## 2016-05-29 MED ORDER — BENZONATATE 100 MG PO CAPS: 100.0000 mg | ORAL_CAPSULE | Freq: Two times a day (BID) | ORAL | 0 refills | Status: DC | PRN

## 2016-05-29 MED ORDER — HYDROCOD POLST-CPM POLST ER 10-8 MG/5ML PO SUER: 5.0000 mL | Freq: Two times a day (BID) | ORAL | 0 refills | Status: DC | PRN

## 2016-05-29 NOTE — Patient Instructions (Signed)
Follow up in several days if worsening or no better.

## 2016-05-29 NOTE — Progress Notes (Signed)
   BP 118/82   Pulse 75   Temp 98.4 F (36.9 C)   Wt 248 lb (112.5 kg)   LMP 06/01/2015   SpO2 98%   BMI 36.62 kg/m    Subjective:    Patient ID: Emily [name], female    DOB: [birth year], 35 y.o.   MRN: [id number]  HPI: Emily [name] is a 35 y.o. female  Chief Complaint  Patient presents with  . URI    sick since Thurs, some fever. head congestion, no chest congestion, runny nose, cough, sore throat, some ear pressure/swimmy headed.   Patient presents with cough, intermittent fever, sore throat, ear pressure, body aches, and rhinorrhea x 5 days. Taking mucinex DM and flonase with some relief. Denies CP, SOB, wheezing, N/V/D. No sick contacts.   Relevant past medical, surgical, family and social history reviewed and updated as indicated. Interim medical history since our last visit reviewed. Allergies and medications reviewed and updated.  Review of Systems  Constitutional: Positive for chills and fever.  HENT: Positive for congestion, ear pain, rhinorrhea, sinus pressure and sore throat.   Eyes: Negative.   Respiratory: Negative.   Cardiovascular: Negative.   Gastrointestinal: Negative.   Genitourinary: Negative.   Musculoskeletal: Positive for myalgias.  Skin: Negative.   Neurological: Negative.   Psychiatric/Behavioral: Negative.     Per HPI unless specifically indicated above     Objective:    BP 118/82   Pulse 75   Temp 98.4 F (36.9 C)   Wt 248 lb (112.5 kg)   LMP 06/01/2015   SpO2 98%   BMI 36.62 kg/m   Wt Readings from Last 3 Encounters:  05/29/16 248 lb (112.5 kg)  05/15/16 250 lb 9.6 oz (113.7 kg)  04/18/16 248 lb (112.5 kg)    Physical Exam  Constitutional: She is oriented to person, place, and time. She appears well-developed and well-nourished.  HENT:  Head: Atraumatic.  Nose: Nose normal.  B/l TMs benign, without effusion or erythema Oropharynx erythematous and edematous b/l without exudates Sinuses non-tender to palpation b/l  Eyes:  Conjunctivae are normal. No scleral icterus.  Neck: Normal range of motion. Neck supple.  Cardiovascular: Normal rate, regular rhythm and normal heart sounds.   Pulmonary/Chest: Effort normal and breath sounds normal. No respiratory distress. She has no wheezes. She has no rales.  Abdominal: Soft. Bowel sounds are normal.  Musculoskeletal: Normal range of motion.  Lymphadenopathy:    She has no cervical adenopathy.  Neurological: She is alert and oriented to person, place, and time.  Skin: Skin is warm and dry. No rash noted. No erythema.  Psychiatric: She has a normal mood and affect. Her behavior is normal.  Nursing note and vitals reviewed.       Assessment & Plan:   Problem List Items Addressed This Visit    None    Visit Diagnoses    Upper respiratory infection    -  Primary   Tessalon perles and tussionex given, continue mucinex DM and flonase and start sudafed. Humidifier, good hydration, rest.      Patient states she has done well with hydrocodone syrups in the past despite codeine allergy listed. Discussed sedation and risks. She is agreeable to plan. Will follow up toward end of the week if no better or worsening.   Follow up plan: Return if symptoms worsen or fail to improve.

## 2016-07-03 ENCOUNTER — Encounter: Payer: Self-pay | Admitting: Family Medicine

## 2016-07-03 ENCOUNTER — Ambulatory Visit (INDEPENDENT_AMBULATORY_CARE_PROVIDER_SITE_OTHER): Payer: Managed Care, Other (non HMO) | Admitting: Family Medicine

## 2016-07-03 VITALS — BP 107/68 | HR 74 | Temp 98.0°F | Ht 69.1 in | Wt 251.0 lb

## 2016-07-03 DIAGNOSIS — E668 Other obesity: Secondary | ICD-10-CM | POA: Diagnosis not present

## 2016-07-03 DIAGNOSIS — Z6836 Body mass index (BMI) 36.0-36.9, adult: Secondary | ICD-10-CM

## 2016-07-03 DIAGNOSIS — Z23 Encounter for immunization: Secondary | ICD-10-CM | POA: Diagnosis not present

## 2016-07-03 DIAGNOSIS — F339 Major depressive disorder, recurrent, unspecified: Secondary | ICD-10-CM

## 2016-07-03 DIAGNOSIS — Z0000 Encounter for general adult medical examination without abnormal findings: Secondary | ICD-10-CM

## 2016-07-03 DIAGNOSIS — Z6838 Body mass index (BMI) 38.0-38.9, adult: Secondary | ICD-10-CM | POA: Insufficient documentation

## 2016-07-03 DIAGNOSIS — F449 Dissociative and conversion disorder, unspecified: Secondary | ICD-10-CM

## 2016-07-03 DIAGNOSIS — E669 Obesity, unspecified: Secondary | ICD-10-CM | POA: Insufficient documentation

## 2016-07-03 HISTORY — DX: Morbid (severe) obesity due to excess calories: E66.01

## 2016-07-03 LAB — URINALYSIS, ROUTINE W REFLEX MICROSCOPIC
Bilirubin, UA: NEGATIVE
GLUCOSE, UA: NEGATIVE
Ketones, UA: NEGATIVE
Leukocytes, UA: NEGATIVE
Nitrite, UA: NEGATIVE
PH UA: 6 (ref 5.0–7.5)
Protein, UA: NEGATIVE
RBC, UA: NEGATIVE
Specific Gravity, UA: 1.015 (ref 1.005–1.030)
UUROB: 0.2 mg/dL (ref 0.2–1.0)

## 2016-07-03 MED ORDER — BUPROPION HCL ER (XL) 300 MG PO TB24: 300.0000 mg | ORAL_TABLET | Freq: Every day | ORAL | 6 refills | Status: DC

## 2016-07-03 NOTE — Assessment & Plan Note (Signed)
Discuss weight loss diet exercise nutrition 

## 2016-07-03 NOTE — Progress Notes (Signed)
BP 107/68 (BP Location: Left Arm, Patient Position: Sitting, Cuff Size: Normal)   Pulse 74   Temp 98 F (36.7 C)   Ht 5' 9.1" (1.755 m)   Wt 251 lb (113.9 kg)   LMP 06/01/2015   SpO2 99%   BMI 36.96 kg/m    Subjective:    Patient ID: Emily [name], female    DOB: [birth year], 35 y.o.   MRN: [id number]  HPI: Emily [name] is a 35 y.o. female  Annual exam  Patient with all in all doing okay still having some nerve issues and wants to get back on medication reviewed medicines taken in the past will be treated and worked the best for her anxieties and nerve issues. Right arm issues are unchanged Over the last month or so patient has noticed her breasts are enlarging bilaterally with tenderness no discharge. Patient status post hysterectomy  Relevant past medical, surgical, family and social history reviewed and updated as indicated. Interim medical history since our last visit reviewed. Allergies and medications reviewed and updated.  Review of Systems  Constitutional: Negative.   HENT: Negative.   Eyes: Negative.   Respiratory: Negative.   Cardiovascular: Negative.   Gastrointestinal: Negative.   Endocrine: Negative.   Genitourinary: Negative.   Musculoskeletal: Negative.   Skin: Negative.   Allergic/Immunologic: Negative.   Neurological: Negative.   Hematological: Negative.   Psychiatric/Behavioral: Negative.     Per HPI unless specifically indicated above     Objective:    BP 107/68 (BP Location: Left Arm, Patient Position: Sitting, Cuff Size: Normal)   Pulse 74   Temp 98 F (36.7 C)   Ht 5' 9.1" (1.755 m)   Wt 251 lb (113.9 kg)   LMP 06/01/2015   SpO2 99%   BMI 36.96 kg/m   Wt Readings from Last 3 Encounters:  07/03/16 251 lb (113.9 kg)  05/29/16 248 lb (112.5 kg)  05/15/16 250 lb 9.6 oz (113.7 kg)    Physical Exam  Constitutional: She is oriented to person, place, and time. She appears well-developed and well-nourished.  HENT:  Head: Normocephalic  and atraumatic.  Right Ear: External ear normal.  Left Ear: External ear normal.  Nose: Nose normal.  Mouth/Throat: Oropharynx is clear and moist.  Eyes: Conjunctivae and EOM are normal. Pupils are equal, round, and reactive to light.  Neck: Normal range of motion. Neck supple. Carotid bruit is not present.  Cardiovascular: Normal rate, regular rhythm and normal heart sounds.   No murmur heard. Pulmonary/Chest: Effort normal and breath sounds normal. She exhibits no mass. Right breast exhibits no mass, no skin change and no tenderness. Left breast exhibits no mass, no skin change and no tenderness. Breasts are symmetrical.  Abdominal: Soft. Bowel sounds are normal. There is no hepatosplenomegaly.  Musculoskeletal: Normal range of motion.  Neurological: She is alert and oriented to person, place, and time.  Skin: No rash noted.  Psychiatric: She has a normal mood and affect. Her behavior is normal. Judgment and thought content normal.    Results for orders placed or performed in visit on 04/18/16  CBC With Differential/Platelet  Result Value Ref Range   WBC 8.1 3.4 - 10.8 x10E3/uL   RBC 4.70 3.77 - 5.28 x10E6/uL   Hemoglobin 14.5 11.1 - 15.9 g/dL   Hematocrit 10.2 72.5 - 46.6 %   MCV 89 79 - 97 fL   MCH 30.9 26.6 - 33.0 pg   MCHC 34.5 31.5 - 35.7 g/dL   RDW  14.0 12.3 - 15.4 %   Platelets 291 150 - 379 x10E3/uL   Neutrophils 58 %   Lymphs 32 %   MID 10 %   Neutrophils Absolute 4.7 1.4 - 7.0 x10E3/uL   Lymphocytes Absolute 2.6 0.7 - 3.1 x10E3/uL   MID (Absolute) 0.8 0.1 - 1.6 X10E3/uL  UA/M w/rflx Culture, Routine  Result Value Ref Range   Specific Gravity, UA 1.020 1.005 - 1.030   pH, UA 5.5 5.0 - 7.5   Color, UA Yellow Yellow   Appearance Ur Cloudy (A) Clear   Leukocytes, UA Negative Negative   Protein, UA Negative Negative/Trace   Glucose, UA Negative Negative   Ketones, UA Negative Negative   RBC, UA Negative Negative   Bilirubin, UA Negative Negative   Urobilinogen,  Ur 0.2 0.2 - 1.0 mg/dL   Nitrite, UA Negative Negative      Assessment & Plan:   Problem List Items Addressed This Visit      Other   Conversion disorder    The current medical regimen is effective;  continue present plan and medications.       Recurrent depression (HCC)    Discussed depression care and treatment will begin Wellbutrin is that helped the most in the past and have a low threshold for psychiatric referral.      Relevant Medications   buPROPion (WELLBUTRIN XL) 300 MG 24 hr tablet   BMI 36.0-36.9,adult    Discuss weight loss diet exercise nutrition       Other Visit Diagnoses    Need for influenza vaccination    -  Primary   Relevant Orders   Flu Vaccine QUAD 36+ mos PF IM (Fluarix & Fluzone Quad PF)   Annual physical exam       Relevant Orders   CBC with Differential/Platelet   Comprehensive metabolic panel   Lipid panel   TSH   Urinalysis, Rout[facility] w reflex microscopic [facility])      For breast tenderness will observe possibly related to ovarian function.  Follow up plan: Return in about 2 months (around 09/02/2016) for med  check.

## 2016-07-03 NOTE — Assessment & Plan Note (Signed)
The current medical regimen is effective;  continue present plan and medications.  

## 2016-07-03 NOTE — Assessment & Plan Note (Signed)
Discussed depression care and treatment will begin Wellbutrin is that helped the most in the past and have a low threshold for psychiatric referral.

## 2016-07-04 ENCOUNTER — Encounter: Payer: Self-pay | Admitting: Family Medicine

## 2016-07-04 LAB — CBC WITH DIFFERENTIAL/PLATELET
BASOS ABS: 0 10*3/uL (ref 0.0–0.2)
Basos: 0 %
EOS (ABSOLUTE): 0.1 10*3/uL (ref 0.0–0.4)
Eos: 1 %
Hematocrit: 42.1 % (ref 34.0–46.6)
Hemoglobin: 14 g/dL (ref 11.1–15.9)
Immature Grans (Abs): 0 10*3/uL (ref 0.0–0.1)
Immature Granulocytes: 0 %
LYMPHS ABS: 2.8 10*3/uL (ref 0.7–3.1)
Lymphs: 29 %
MCH: 29.8 pg (ref 26.6–33.0)
MCHC: 33.3 g/dL (ref 31.5–35.7)
MCV: 90 fL (ref 79–97)
MONOS ABS: 0.9 10*3/uL (ref 0.1–0.9)
Monocytes: 9 %
NEUTROS PCT: 61 %
Neutrophils Absolute: 5.9 10*3/uL (ref 1.4–7.0)
PLATELETS: 324 10*3/uL (ref 150–379)
RBC: 4.7 x10E6/uL (ref 3.77–5.28)
RDW: 13.7 % (ref 12.3–15.4)
WBC: 9.6 10*3/uL (ref 3.4–10.8)

## 2016-07-04 LAB — COMPREHENSIVE METABOLIC PANEL
ALK PHOS: 86 IU/L (ref 39–117)
ALT: 25 IU/L (ref 0–32)
AST: 24 IU/L (ref 0–40)
Albumin/Globulin Ratio: 1.5 (ref 1.2–2.2)
Albumin: 4.4 g/dL (ref 3.5–5.5)
BUN / CREAT RATIO: 11 (ref 9–23)
BUN: 9 mg/dL (ref 6–20)
Bilirubin Total: 1.1 mg/dL (ref 0.0–1.2)
CHLORIDE: 97 mmol/L (ref 96–106)
CO2: 25 mmol/L (ref 18–29)
CREATININE: 0.84 mg/dL (ref 0.57–1.00)
Calcium: 10.3 mg/dL — ABNORMAL HIGH (ref 8.7–10.2)
GFR calc non Af Amer: 90 mL/min/{1.73_m2} (ref >59)
GFR, EST AFRICAN AMERICAN: 104 mL/min/{1.73_m2} (ref >59)
GLUCOSE: 83 mg/dL (ref 65–99)
Globulin, Total: 2.9 g/dL (ref 1.5–4.5)
Potassium: 5 mmol/L (ref 3.5–5.2)
SODIUM: 139 mmol/L (ref 134–144)
Total Protein: 7.3 g/dL (ref 6.0–8.5)

## 2016-07-04 LAB — LIPID PANEL
CHOLESTEROL TOTAL: 198 mg/dL (ref 100–199)
Chol/HDL Ratio: 4.8 ratio units — ABNORMAL HIGH (ref 0.0–4.4)
HDL: 41 mg/dL (ref >39)
LDL CALC: 124 mg/dL — AB (ref 0–99)
Triglycerides: 164 mg/dL — ABNORMAL HIGH (ref 0–149)
VLDL Cholesterol Cal: 33 mg/dL (ref 5–40)

## 2016-07-04 LAB — TSH: TSH: 2.33 u[IU]/mL (ref 0.450–4.500)

## 2016-09-03 ENCOUNTER — Encounter: Payer: Self-pay | Admitting: Family Medicine

## 2016-09-03 ENCOUNTER — Ambulatory Visit (INDEPENDENT_AMBULATORY_CARE_PROVIDER_SITE_OTHER): Payer: Managed Care, Other (non HMO) | Admitting: Family Medicine

## 2016-09-03 DIAGNOSIS — F339 Major depressive disorder, recurrent, unspecified: Secondary | ICD-10-CM

## 2016-09-03 DIAGNOSIS — Z6838 Body mass index (BMI) 38.0-38.9, adult: Secondary | ICD-10-CM | POA: Diagnosis not present

## 2016-09-03 DIAGNOSIS — F449 Dissociative and conversion disorder, unspecified: Secondary | ICD-10-CM

## 2016-09-03 MED ORDER — BUPROPION HCL ER (XL) 300 MG PO TB24: 300.0000 mg | ORAL_TABLET | Freq: Every day | ORAL | 6 refills | Status: DC

## 2016-09-03 NOTE — Assessment & Plan Note (Signed)
Arm with no change in symptoms stable.

## 2016-09-03 NOTE — Assessment & Plan Note (Signed)
Stable on Wellbutrin will continue long-term.

## 2016-09-03 NOTE — Assessment & Plan Note (Signed)
Discussed diet exercise nutrition and intensifying program by several degrees.

## 2016-09-03 NOTE — Progress Notes (Signed)
BP 113/74 (BP Location: Left Arm, Patient Position: Sitting, Cuff Size: Normal)   Pulse 75   Temp 98.4 F (36.9 C)   Ht 5\' 9"  (1.753 m)   Wt 257 lb 12.8 oz (116.9 kg)   LMP 06/01/2015 (Within Days)   SpO2 95%   BMI 38.07 kg/m    Subjective:    Patient ID: Emily [name], female    DOB: [birth year], 35 y.o.   MRN: [id number]  HPI: Emily Beammily N Servidio is a 35 y.o. female  Chief Complaint  Patient presents with  . Medication Follow-up   Patient with great deal of consternation and questioning about diet exercise nutrition. Patient very well that she has a degree in nutrition and continues to have problems with weight. Patient's been actively trying various diets and calorie counts with little results and in fact weight gain. Patient otherwise doing okay taking Wellbutrin without problems Reviewed labs and previous notes with no particular clues. Patient now doing well on Wellbutrin and will continue. Relevant past medical, surgical, family and social history reviewed and updated as indicated. Interim medical history since our last visit reviewed. Allergies and medications reviewed and updated.  Review of Systems  Constitutional: Negative.   Respiratory: Negative.   Cardiovascular: Negative.     Per HPI unless specifically indicated above     Objective:    BP 113/74 (BP Location: Left Arm, Patient Position: Sitting, Cuff Size: Normal)   Pulse 75   Temp 98.4 F (36.9 C)   Ht 5\' 9"  (1.753 m)   Wt 257 lb 12.8 oz (116.9 kg)   LMP 06/01/2015 (Within Days)   SpO2 95%   BMI 38.07 kg/m   Wt Readings from Last 3 Encounters:  09/03/16 257 lb 12.8 oz (116.9 kg)  07/03/16 251 lb (113.9 kg)  05/29/16 248 lb (112.5 kg)    Physical Exam  Constitutional: She is oriented to person, place, and time. She appears well-developed and well-nourished. No distress.  HENT:  Head: Normocephalic and atraumatic.  Right Ear: Hearing normal.  Left Ear: Hearing normal.  Nose: Nose normal.  Eyes:  Conjunctivae and lids are normal. Right eye exhibits no discharge. Left eye exhibits no discharge. No scleral icterus.  Cardiovascular: Normal rate, regular rhythm and normal heart sounds.   Pulmonary/Chest: Effort normal and breath sounds normal. No respiratory distress.  Musculoskeletal: Normal range of motion.  Neurological: She is alert and oriented to person, place, and time.  Skin: Skin is intact. No rash noted.  Psychiatric: She has a normal mood and affect. Her speech is normal and behavior is normal. Judgment and thought content normal. Cognition and memory are normal.    Results for orders placed or performed in visit on 07/03/16  CBC with Differential/Platelet  Result Value Ref Range   WBC 9.6 3.4 - 10.8 x10E3/uL   RBC 4.70 3.77 - 5.28 x10E6/uL   Hemoglobin 14.0 11.1 - 15.9 g/dL   Hematocrit 40.942.1 81.134.0 - 46.6 %   MCV 90 79 - 97 fL   MCH 29.8 26.6 - 33.0 pg   MCHC 33.3 31.5 - 35.7 g/dL   RDW 91.413.7 78.212.3 - 95.615.4 %   Platelets 324 150 - 379 x10E3/uL   Neutrophils 61 Not Estab. %   Lymphs 29 Not Estab. %   Monocytes 9 Not Estab. %   Eos 1 Not Estab. %   Basos 0 Not Estab. %   Neutrophils Absolute 5.9 1.4 - 7.0 x10E3/uL   Lymphocytes Absolute 2.8 0.7 -  3.1 x10E3/uL   Monocytes Absolute 0.9 0.1 - 0.9 x10E3/uL   EOS (ABSOLUTE) 0.1 0.0 - 0.4 x10E3/uL   Basophils Absolute 0.0 0.0 - 0.2 x10E3/uL   Immature Granulocytes 0 Not Estab. %   Immature Grans (Abs) 0.0 0.0 - 0.1 x10E3/uL  Comprehensive metabolic panel  Result Value Ref Range   Glucose 83 65 - 99 mg/dL   BUN 9 6 - 20 mg/dL   Creatinine, Ser 4.400.84 0.57 - 1.00 mg/dL   GFR calc non Af Amer 90 >59 mL/min/1.73   GFR calc Af Amer 104 >59 mL/min/1.73   BUN/Creatinine Ratio 11 9 - 23   Sodium 139 134 - 144 mmol/L   Potassium 5.0 3.5 - 5.2 mmol/L   Chloride 97 96 - 106 mmol/L   CO2 25 18 - 29 mmol/L   Calcium 10.3 (H) 8.7 - 10.2 mg/dL   Total Protein 7.3 6.0 - 8.5 g/dL   Albumin 4.4 3.5 - 5.5 g/dL   Globulin, Total 2.9 1.5 -  4.5 g/dL   Albumin/Globulin Ratio 1.5 1.2 - 2.2   Bilirubin Total 1.1 0.0 - 1.2 mg/dL   Alkaline Phosphatase 86 39 - 117 IU/L   AST 24 0 - 40 IU/L   ALT 25 0 - 32 IU/L  Lipid panel  Result Value Ref Range   Cholesterol, Total 198 100 - 199 mg/dL   Triglycerides 347164 (H) 0 - 149 mg/dL   HDL 41 >42>39 mg/dL   VLDL Cholesterol Cal 33 5 - 40 mg/dL   LDL Calculated 595124 (H) 0 - 99 mg/dL   Chol/HDL Ratio 4.8 (H) 0.0 - 4.4 ratio units  TSH  Result Value Ref Range   TSH 2.330 0.450 - 4.500 uIU/mL  Urinalysis, Routine w reflex microscopic ([facility])  Result Value Ref Range   Specific Gravity, UA 1.015 1.005 - 1.030   pH, UA 6.0 5.0 - 7.5   Color, UA Yellow Yellow   Appearance Ur Clear Clear   Leukocytes, UA Negative Negative   Protein, UA Negative Negative/Trace   Glucose, UA Negative Negative   Ketones, UA Negative Negative   RBC, UA Negative Negative   Bilirubin, UA Negative Negative   Urobilinogen, Ur 0.2 0.2 - 1.0 mg/dL   Nitrite, UA Negative Negative      Assessment & Plan:   Problem List Items Addressed This Visit      Other   Conversion disorder    Arm with no change in symptoms stable.      Recurrent depression (HCC)    Stable on Wellbutrin will continue long-term.      Relevant Medications   buPROPion (WELLBUTRIN XL) 300 MG 24 hr tablet   BMI 38.0-38.9,adult    Discussed diet exercise nutrition and intensifying program by several degrees.          Follow up plan: Return in about 6 months (around 03/04/2017), or if symptoms worsen or fail to improve, for medcheck.

## 2016-09-12 ENCOUNTER — Encounter: Payer: Self-pay | Admitting: Family Medicine

## 2016-09-12 ENCOUNTER — Ambulatory Visit (INDEPENDENT_AMBULATORY_CARE_PROVIDER_SITE_OTHER): Payer: Managed Care, Other (non HMO) | Admitting: Family Medicine

## 2016-09-12 ENCOUNTER — Ambulatory Visit: Payer: Managed Care, Other (non HMO) | Admitting: Family Medicine

## 2016-09-12 VITALS — BP 115/77 | HR 71 | Temp 98.0°F | Wt 258.0 lb

## 2016-09-12 DIAGNOSIS — R300 Dysuria: Secondary | ICD-10-CM

## 2016-09-12 LAB — UA/M W/RFLX CULTURE, ROUTINE
Bilirubin, UA: NEGATIVE
Glucose, UA: NEGATIVE
KETONES UA: NEGATIVE
Leukocytes, UA: NEGATIVE
NITRITE UA: NEGATIVE
PH UA: 6.5 (ref 5.0–7.5)
Protein, UA: NEGATIVE
RBC UA: NEGATIVE
SPEC GRAV UA: 1.02 (ref 1.005–1.030)
UUROB: 0.2 mg/dL (ref 0.2–1.0)

## 2016-09-12 NOTE — Patient Instructions (Signed)
Follow up if no improvement 

## 2016-09-12 NOTE — Progress Notes (Signed)
BP 115/77 (BP Location: Right Arm, Patient Position: Sitting, Cuff Size: Normal)   Pulse 71   Temp 98 F (36.7 C) (Oral)   Wt 258 lb (117 kg)   LMP 06/01/2015 (Within Days)   SpO2 100%   BMI 38.10 kg/m    Subjective:    Patient ID: Emily [name], female    DOB: 1980/12/21, 35 y.o.   MRN: [id number]  HPI: Emily [name] is a 35 y.o. female  Chief Complaint  Patient presents with  . Dysuria    X 3 days   Patient presents with 3 day history of dysuria and urinary frequency. States she has been trying to drink lots of water to help flush out any bacteria but is still experiencing symptoms. Denies fever, chills, back pain, N/V/D, or hematuria. Does state that she has a history of UTIs, and sometimes the initial U/A won't show an infection. Has not tried anyting OTC, but does take a probiotic and cranberry regularly.     Relevant past medical, surgical, family and social history reviewed and updated as indicated. Interim medical history since our last visit reviewed. Allergies and medications reviewed and updated.  Review of Systems  Constitutional: Negative.   HENT: Negative.   Eyes: Negative.   Respiratory: Negative.   Cardiovascular: Negative.   Gastrointestinal: Negative.   Genitourinary: Positive for dysuria and frequency.  Musculoskeletal: Negative.   Neurological: Negative.   Psychiatric/Behavioral: Negative.     Per HPI unless specifically indicated above     Objective:    BP 115/77 (BP Location: Right Arm, Patient Position: Sitting, Cuff Size: Normal)   Pulse 71   Temp 98 F (36.7 C) (Oral)   Wt 258 lb (117 kg)   LMP 06/01/2015 (Within Days)   SpO2 100%   BMI 38.10 kg/m   Wt Readings from Last 3 Encounters:  09/12/16 258 lb (117 kg)  09/03/16 257 lb 12.8 oz (116.9 kg)  07/03/16 251 lb (113.9 kg)    Physical Exam  Constitutional: She is oriented to person, place, and time. She appears well-developed and well-nourished. No distress.  HENT:  Head:  Atraumatic.  Eyes: Conjunctivae are normal. Pupils are equal, round, and reactive to light. No scleral icterus.  Neck: Normal range of motion. Neck supple.  Cardiovascular: Normal rate and normal heart sounds.   Pulmonary/Chest: Effort normal. No respiratory distress.  Abdominal: Soft. Bowel sounds are normal. She exhibits no distension. There is tenderness (mild TTP diffuse RLQ ).  Musculoskeletal: Normal range of motion.  No CVA tenderness  Neurological: She is alert and oriented to person, place, and time.  Skin: Skin is warm and dry.  Psychiatric: She has a normal mood and affect. Her behavior is normal.  Nursing note and vitals reviewed.   Results for orders placed or performed in visit on 07/03/16  CBC with Differential/Platelet  Result Value Ref Range   WBC 9.6 3.4 - 10.8 x10E3/uL   RBC 4.70 3.77 - 5.28 x10E6/uL   Hemoglobin 14.0 11.1 - 15.9 g/dL   Hematocrit 40.942.1 81.134.0 - 46.6 %   MCV 90 79 - 97 fL   MCH 29.8 26.6 - 33.0 pg   MCHC 33.3 31.5 - 35.7 g/dL   RDW 91.413.7 78.212.3 - 95.615.4 %   Platelets 324 150 - 379 x10E3/uL   Neutrophils 61 Not Estab. %   Lymphs 29 Not Estab. %   Monocytes 9 Not Estab. %   Eos 1 Not Estab. %   Basos 0 Not  Estab. %   Neutrophils Absolute 5.9 1.4 - 7.0 x10E3/uL   Lymphocytes Absolute 2.8 0.7 - 3.1 x10E3/uL   Monocytes Absolute 0.9 0.1 - 0.9 x10E3/uL   EOS (ABSOLUTE) 0.1 0.0 - 0.4 x10E3/uL   Basophils Absolute 0.0 0.0 - 0.2 x10E3/uL   Immature Granulocytes 0 Not Estab. %   Immature Grans (Abs) 0.0 0.0 - 0.1 x10E3/uL  Comprehensive metabolic panel  Result Value Ref Range   Glucose 83 65 - 99 mg/dL   BUN 9 6 - 20 mg/dL   Creatinine, Ser 4.090.84 0.57 - 1.00 mg/dL   GFR calc non Af Amer 90 >59 mL/min/1.73   GFR calc Af Amer 104 >59 mL/min/1.73   BUN/Creatinine Ratio 11 9 - 23   Sodium 139 134 - 144 mmol/L   Potassium 5.0 3.5 - 5.2 mmol/L   Chloride 97 96 - 106 mmol/L   CO2 25 18 - 29 mmol/L   Calcium 10.3 (H) 8.7 - 10.2 mg/dL   Total Protein 7.3 6.0  - 8.5 g/dL   Albumin 4.4 3.5 - 5.5 g/dL   Globulin, Total 2.9 1.5 - 4.5 g/dL   Albumin/Globulin Ratio 1.5 1.2 - 2.2   Bilirubin Total 1.1 0.0 - 1.2 mg/dL   Alkaline Phosphatase 86 39 - 117 IU/L   AST 24 0 - 40 IU/L   ALT 25 0 - 32 IU/L  Lipid panel  Result Value Ref Range   Cholesterol, Total 198 100 - 199 mg/dL   Triglycerides 811164 (H) 0 - 149 mg/dL   HDL 41 >91>39 mg/dL   VLDL Cholesterol Cal 33 5 - 40 mg/dL   LDL Calculated 478124 (H) 0 - 99 mg/dL   Chol/HDL Ratio 4.8 (H) 0.0 - 4.4 ratio units  TSH  Result Value Ref Range   TSH 2.330 0.450 - 4.500 uIU/mL  Urinalysis, Routine w reflex microscopic ([facility])  Result Value Ref Range   Specific Gravity, UA 1.015 1.005 - 1.030   pH, UA 6.0 5.0 - 7.5   Color, UA Yellow Yellow   Appearance Ur Clear Clear   Leukocytes, UA Negative Negative   Protein, UA Negative Negative/Trace   Glucose, UA Negative Negative   Ketones, UA Negative Negative   RBC, UA Negative Negative   Bilirubin, UA Negative Negative   Urobilinogen, Ur 0.2 0.2 - 1.0 mg/dL   Nitrite, UA Negative Negative      Assessment & Plan:   Problem List Items Addressed This Visit    None    Visit Diagnoses    Dysuria    -  Primary   U/A negative, continue drinking lots of water, probiotics, cranberry. Offered pyridium and an anticipatory antibiotic but pt refused. F/u if no improvement   Relevant Orders   UA/M w/rflx Culture, Routine      Follow up plan: Return if symptoms worsen or fail to improve.

## 2016-10-02 ENCOUNTER — Ambulatory Visit (INDEPENDENT_AMBULATORY_CARE_PROVIDER_SITE_OTHER): Payer: Commercial Managed Care - PPO | Admitting: Obstetrics and Gynecology

## 2016-10-02 VITALS — BP 109/75 | HR 82 | Ht 69.0 in | Wt 258.6 lb

## 2016-10-02 DIAGNOSIS — R232 Flushing: Secondary | ICD-10-CM | POA: Diagnosis not present

## 2016-10-02 DIAGNOSIS — Z90710 Acquired absence of both cervix and uterus: Secondary | ICD-10-CM | POA: Diagnosis not present

## 2016-10-02 DIAGNOSIS — R6882 Decreased libido: Secondary | ICD-10-CM

## 2016-10-02 NOTE — Progress Notes (Signed)
    GYNECOLOGY PROGRESS NOTE  Subjective:    Patient ID: Emily [name], female    DOB: 1981/09/23, 36 y.o.   MRN: [id number]  HPI  Patient is a 36 y.o. 603P3003 female who presents for complaints of hot flushes, decreased libido, and occasional night sweats.  Patient notes that she has been experiencing these symptoms for several months. Of note, patient has a h/o LAVH with bilateral salpingectomy in 05/2015.  Reports that she has told several providers about her symptoms, but doesn't feel as though her complaints were addressed.   The following portions of the patient's history were reviewed and updated as appropriate: allergies, current medications, past family history, past medical history, past social history, past surgical history and problem list.  Review of Systems Pertinent items noted in HPI and remainder of comprehensive ROS otherwise negative.   Objective:   Blood pressure 109/75, pulse 82, height 5\' 9"  (1.753 m), weight 258 lb 9.6 oz (117.3 kg), last menstrual period 06/01/2015. General appearance: alert and no distress Remainder of exam deferred.    Labs:  Lab Results  Component Value Date   TSH 2.330 07/03/2016     Assessment:   Vasomotor symptoms and decreased libido S/p hysterectomy  Plan:   Discussed that patient does have 2 functional ovaries (not removed during hysterectomy). However on further discussion, patient notes that her mother began noting symptoms of menopause (similar vasomotor symptoms) around this same age (~ 538, after having her hysterectomy several years prior).  Will assess hormonal status.  Discussed possibility of premature ovarian failure. Briefly discussed management options, including hormonal supplementation and non-hormonal options.  Patient will make decision based on la results.    Hildred LaserAnika Clebert Wenger, MD [facility]

## 2016-10-08 ENCOUNTER — Encounter: Payer: Self-pay | Admitting: Obstetrics and Gynecology

## 2016-10-11 LAB — STATUS REPORT

## 2016-10-12 LAB — FSH/LH
FSH: 4.4 m[IU]/mL
LH: 4 m[IU]/mL

## 2016-10-12 LAB — TESTOSTERONE, FREE, TOTAL, SHBG
Sex Hormone Binding: 36.2 nmol/L (ref 24.6–122.0)
Testosterone, Free: 3.3 pg/mL (ref 0.0–4.2)
Testosterone: 28 ng/dL (ref 8–48)

## 2016-10-12 LAB — PROGESTERONE: PROGESTERONE: 0.3 ng/mL

## 2016-10-12 LAB — ESTRADIOL, FREE

## 2016-10-16 ENCOUNTER — Encounter: Payer: Self-pay | Admitting: Obstetrics and Gynecology

## 2016-10-30 ENCOUNTER — Encounter: Payer: Self-pay | Admitting: Obstetrics and Gynecology

## 2016-10-30 ENCOUNTER — Ambulatory Visit (INDEPENDENT_AMBULATORY_CARE_PROVIDER_SITE_OTHER): Payer: Commercial Managed Care - PPO | Admitting: Obstetrics and Gynecology

## 2016-10-30 VITALS — BP 109/75 | HR 80 | Ht 69.0 in | Wt 259.4 lb

## 2016-10-30 DIAGNOSIS — R232 Flushing: Secondary | ICD-10-CM | POA: Diagnosis not present

## 2016-10-30 DIAGNOSIS — R6882 Decreased libido: Secondary | ICD-10-CM

## 2016-10-30 DIAGNOSIS — E559 Vitamin D deficiency, unspecified: Secondary | ICD-10-CM

## 2016-10-30 DIAGNOSIS — E538 Deficiency of other specified B group vitamins: Secondary | ICD-10-CM | POA: Diagnosis not present

## 2016-10-30 DIAGNOSIS — N644 Mastodynia: Secondary | ICD-10-CM

## 2016-10-31 LAB — VITAMIN D 25 HYDROXY (VIT D DEFICIENCY, FRACTURES): VIT D 25 HYDROXY: 22.9 ng/mL — AB (ref 30.0–100.0)

## 2016-10-31 LAB — VITAMIN B12: Vitamin B-12: 243 pg/mL (ref 232–1245)

## 2016-10-31 LAB — ESTRADIOL: ESTRADIOL: 24.7 pg/mL

## 2016-11-01 NOTE — Progress Notes (Signed)
    GYNECOLOGY PROGRESS NOTE  Subjective:    Patient ID: Emily [name], female    DOB: Jun 23, 1981, 36 y.o.   MRN: [id number]  HPI  Patient is a 36 y.o. [id number] female with h/o LAVH with bilateral salpingectomy who presents for f/u of lab work.  Patient has been noting complaints of hot flushes, decreased libido, and occasional night sweats for several months.  These symptoms have been ongoing for several months.   The following portions of the patient's history were reviewed and updated as appropriate: allergies, current medications, past family history, past medical history, past social history, past surgical history and problem list.  Review of Systems A comprehensive review of systems was negative except for: Integument/breast: positive for breast tenderness (bilaterally).  Denies palpable lumps.   Objective:   Blood pressure 109/75, pulse 80, height 5\' 9"  (1.753 m), weight 259 lb 6.4 oz (117.7 kg), last menstrual period 06/01/2015. General appearance: alert and no distress Breasts: Breasts: breasts appear normal, no suspicious masses, no skin or nipple changes or axillary nodes. Remainder of exam deferred.    Labs:  Results for Emily BeamMILLS, Everline N (MRN [id number]) as of 11/01/2016 17:38  Ref. Range 10/02/2016 14:13  LH Latest Units: mIU/mL 4.0  FSH Latest Units: mIU/mL 4.4  Progesterone Latest Units: ng/mL 0.3  Sex Horm Binding Glob, Serum Latest Ref Range: 24.6 - 122.0 nmol/L 36.2  Testosterone Latest Ref Range: 8 - 48 ng/dL 28  Testosterone Free Latest Ref Range: 0.0 - 4.2 pg/mL 3.3    Assessment:   Vasomotor symptoms Decreased libido Breast tenderness H/o Vitamin D and B12 deficiency  Plan:   - On review of labs, patient does not appear to be perimenopausal.  All labs normal.  Estradiol levels not drawn due to low volume of blood. Will reorder today. Discussed with patient.  Reviewed potential causes of current symptoms, including ovarian insufficiency.  Given handout on herbal  remedies to try.  If still no success, can consider a trial of hormonal supplementation.  - Will also order Vitamin D and b12 levels as patient with h/o deficiencies.  - To f/u in 4-6 weeks to reassess symptoms.    Hildred LaserAnika Jassmin Kemmerer, MD [facility]

## 2016-11-14 ENCOUNTER — Ambulatory Visit (INDEPENDENT_AMBULATORY_CARE_PROVIDER_SITE_OTHER): Payer: Commercial Managed Care - PPO | Admitting: Family Medicine

## 2016-11-14 ENCOUNTER — Encounter: Payer: Self-pay | Admitting: Family Medicine

## 2016-11-14 VITALS — BP 122/85 | HR 80 | Temp 98.3°F | Resp 17 | Ht 69.0 in | Wt 261.0 lb

## 2016-11-14 DIAGNOSIS — J111 Influenza due to unidentified influenza virus with other respiratory manifestations: Secondary | ICD-10-CM

## 2016-11-14 LAB — VERITOR FLU A/B WAIVED
Influenza A: NEGATIVE
Influenza B: NEGATIVE

## 2016-11-14 MED ORDER — OSELTAMIVIR PHOSPHATE 75 MG PO CAPS
75.0000 mg | ORAL_CAPSULE | Freq: Two times a day (BID) | ORAL | 0 refills | Status: DC
Start: 2016-11-14 — End: 2016-11-27

## 2016-11-14 NOTE — Progress Notes (Signed)
BP 122/85 (BP Location: Left Arm, Patient Position: Sitting, Cuff Size: Large)   Pulse 80   Temp 98.3 F (36.8 C) (Oral)   Resp 17   Ht 5\' 9"  (1.753 m)   Wt 261 lb (118.4 kg)   LMP 06/01/2015 (Within Days)   SpO2 98%   BMI 38.54 kg/m    Subjective:    Patient ID: Emily [name], female    DOB: [birth year], 36 y.o.   MRN: [id number]  HPI: Emily [name] is a 36 y.o. female  Chief Complaint  Patient presents with  . Sore Throat    onset 2-3 days  . Cough  . Shortness of Breath  . Headache   Sore throat, drainage, cough, HA, body aches, chills, fatigue. Some SOB but only with exertion. No fever that she knows of. Taking Ibuprofen and benadryl with minimal relief. No sick contacts.   Past Medical History:  Diagnosis Date  . Anemia   . 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 Bipolar 1 disorder (HCC)   . Conversion disorder   . Depression   . Insomnia   . Migraines   . MRSA infection 2010  . Ovarian cyst    left 2.5cm  . Sleep apnea   . Vitamin B 12 deficiency   . Vitamin D deficiency    Social History   Social History  . Marital status: Married    Spouse name: N/A  . Number of children: N/A  . Years of education: N/A   Occupational History  . Not on file.   Social History Main Topics  . Smoking status: Former Smoker    Packs/day: 1.00    Years: 10.00    Types: Cigarettes    Quit date: 09/25/2003  . Smokeless tobacco: Never Used  . Alcohol use 0.0 - 0.6 oz/week     Comment: pt states she is a social drinker  . Drug use: No  . Sexual activity: Yes    Birth control/ protection: Surgical   Other Topics Concern  . Not on file   Social History Narrative  . No narrative on file    Relevant past medical, surgical, family and social history reviewed and updated as indicated. Interim medical history since our last visit reviewed. Allergies and medications reviewed and updated.  Review of Systems  Constitutional: Positive for chills, diaphoresis and  fatigue.  HENT: Positive for congestion, rhinorrhea and sore throat.   Eyes: Negative.   Respiratory: Positive for cough and shortness of breath.   Gastrointestinal: Negative.   Genitourinary: Negative.   Musculoskeletal: Positive for myalgias.  Neurological: Positive for headaches.  Psychiatric/Behavioral: Negative.     Per HPI unless specifically indicated above     Objective:    BP 122/85 (BP Location: Left Arm, Patient Position: Sitting, Cuff Size: Large)   Pulse 80   Temp 98.3 F (36.8 C) (Oral)   Resp 17   Ht 5\' 9"  (1.753 m)   Wt 261 lb (118.4 kg)   LMP 06/01/2015 (Within Days)   SpO2 98%   BMI 38.54 kg/m   Wt Readings from Last 3 Encounters:  11/14/16 261 lb (118.4 kg)  10/30/16 259 lb 6.4 oz (117.7 kg)  10/02/16 258 lb 9.6 oz (117.3 kg)    Physical Exam  Constitutional: She is oriented to person, place, and time. She appears well-developed and well-nourished. No distress.  HENT:  Head: Atraumatic.  Right Ear: External ear normal.  Left Ear: External ear normal.  Nasal mucosa and oropharynx erythematous Thick drainage present in nares  Eyes: Conjunctivae and EOM are normal. Pupils are equal, round, and reactive to light.  Neck: Normal range of motion. Neck supple.  Cardiovascular: Normal rate and normal heart sounds.   Pulmonary/Chest: Effort normal and breath sounds normal. No respiratory distress. She has no wheezes.  Musculoskeletal: Normal range of motion.  Lymphadenopathy:    She has cervical adenopathy.  Neurological: She is alert and oriented to person, place, and time. No cranial nerve deficit.  Skin: Skin is warm and dry.  Psychiatric: She has a normal mood and affect. Her behavior is normal.  Nursing note and vitals reviewed.     Assessment & Plan:   Problem List Items Addressed This Visit    None    Visit Diagnoses    Influenza    -  Primary   Rapid flu neg, but classic sxs. Will treat with tamiflu. Discussed supportive care, mucinex,  delsym prn. Symbicort sample given in case asthma flares.    Relevant Medications   oseltamivir (TAMIFLU) 75 MG capsule   Other Relevant Orders   Veritor Flu A/B Waived       Follow up plan: Return if symptoms worsen or fail to improve.

## 2016-11-14 NOTE — Patient Instructions (Signed)
Follow up as needed

## 2016-11-16 ENCOUNTER — Other Ambulatory Visit: Payer: Self-pay | Admitting: Family Medicine

## 2016-11-16 ENCOUNTER — Telehealth: Payer: Self-pay | Admitting: Family Medicine

## 2016-11-16 ENCOUNTER — Encounter: Payer: Self-pay | Admitting: Family Medicine

## 2016-11-16 MED ORDER — AMOXICILLIN-POT CLAVULANATE 875-125 MG PO TABS: 1.0000 | ORAL_TABLET | Freq: Two times a day (BID) | ORAL | 0 refills | Status: DC

## 2016-11-16 NOTE — Telephone Encounter (Signed)
ok 

## 2016-11-16 NOTE — Telephone Encounter (Signed)
Emily [name], is it ok to send this back in to the pharmacy?

## 2016-11-16 NOTE — Telephone Encounter (Signed)
RX called in .

## 2016-11-16 NOTE — Telephone Encounter (Signed)
Pt called and stated that her dog got to her antibiotic and she would like to know if she can have it sent in again.

## 2016-11-27 ENCOUNTER — Ambulatory Visit (INDEPENDENT_AMBULATORY_CARE_PROVIDER_SITE_OTHER): Payer: Commercial Managed Care - PPO | Admitting: Obstetrics and Gynecology

## 2016-11-27 ENCOUNTER — Encounter: Payer: Self-pay | Admitting: Obstetrics and Gynecology

## 2016-11-27 VITALS — BP 125/80 | HR 80 | Ht 69.0 in | Wt 262.5 lb

## 2016-11-27 DIAGNOSIS — R232 Flushing: Secondary | ICD-10-CM

## 2016-11-27 DIAGNOSIS — N644 Mastodynia: Secondary | ICD-10-CM

## 2016-11-27 DIAGNOSIS — R6882 Decreased libido: Secondary | ICD-10-CM | POA: Diagnosis not present

## 2016-11-27 NOTE — Patient Instructions (Signed)
Breast Tenderness Breast tenderness is a common problem for women of all ages. Breast tenderness may cause mild discomfort to severe pain. The pain usually comes and goes in association with your menstrual cycle, but it can be constant. Breast tenderness has many possible causes, including hormone changes and some medicines. Your health care provider may order tests, such as a mammogram or an ultrasound, to check for any unusual findings. Having breast tenderness usually does not mean that you have breast cancer. Follow these instructions at home: Sometimes, reassurance that you do not have breast cancer is all that is needed. In general, follow these home care instructions: Managing pain and discomfort  If directed, apply ice to the area:  Put ice in a plastic bag.  Place a towel between your skin and the bag.  Leave the ice on for 20 minutes, 2-3 times a day.  Make sure you are wearing a supportive bra, especially during exercise. You may also want to wear a supportive bra while sleeping if your breasts are very tender. Medicines  Take over-the-counter and prescription medicines only as told by your health care provider. If the cause of your pain is infection, you may be prescribed an antibiotic medicine.  If you were prescribed an antibiotic, take it as told by your health care provider. Do not stop taking the antibiotic even if you start to feel better. General instructions  Your health care provider may recommend that you reduce the amount of fat in your diet. You can do this by:  Limiting fried foods.  Cooking foods using methods, such as baking, boiling, grilling, and broiling.  Decrease the amount of caffeine in your diet. You can do this by drinking more water and choosing caffeine-free options.  Keep a log of the days and times when your breasts are most tender.  Ask your health care provider how to do breast exams at home. This will help you notice if you have an unusual  growth or lump. Contact a health care provider if:  Any part of your breast is hard, red, and hot to the touch. This may be a sign of infection.  You are not breastfeeding and you have fluid, especially blood or pus, coming out of your nipples.  You have a fever.  You have a new or painful lump in your breast that remains after your menstrual period ends.  Your pain does not improve or it gets worse.  Your pain is interfering with your daily activities. This information is not intended to replace advice given to you by your health care provider. Make sure you discuss any questions you have with your health care provider. Document Released: 08/23/2008 Document Revised: 06/08/2016 Document Reviewed: 06/08/2016 Elsevier Interactive Patient Education  2017 [employer]  

## 2016-11-27 NOTE — Progress Notes (Signed)
    GYNECOLOGY PROGRESS NOTE  Subjective:    Patient ID: Emily [name], female    DOB: January 21, 1981, 36 y.o.   MRN: [id number]  HPI  Patient is a 36 y.o. [id number] female who presents for f/u of hot flushes, night sweats, breast tenderness and enlargement, and decreased libido, ongoing x several months.  Patient with h/o LAVH and bilateral salpingectomy in 2016 .  Notes that breast enlargement has finally ceased, but still noting tenderness.  States that she tried herbal remedy Ashwagonda which helped all symptoms for ~ 1 week, but then symptoms returned.   The following portions of the patient's history were reviewed and updated as appropriate: allergies, current medications, past family history, past medical history, past social history, past surgical history and problem list.  Review of Systems Pertinent items noted in HPI and remainder of comprehensive ROS otherwise negative.   Objective:   Blood pressure 125/80, pulse 80, height 5\' 9"  (1.753 m), weight 262 lb 8 oz (119.1 kg), last menstrual period 06/01/2015. General appearance: alert and no distress Exam deferred.   Assessment:   Vasomotor symptoms Breast tenderness Decreased libido  Plan:   Discussed alternative solutions for patient, including use of Brisdelle for vasomotor symptoms, or can try OCPs.  Patient will try samples of both.  Will start with Brisdelle, and if this does not help, will try Taytulla. Advised to call back with results of medications, and if prescription is desired.   Can continue taking Ashwagonda, can take up to 2 x daily.  Discussed conservative measures again for managing breast tenderness (i.e. Ice packs, NSAIDs, dietary monitoring).  Has had prior normal breast exam at a previous visit.    Hildred LaserAnika Juriel Cid, MD [facility]

## 2017-02-28 ENCOUNTER — Encounter: Payer: Self-pay | Admitting: Family Medicine

## 2017-02-28 ENCOUNTER — Ambulatory Visit (INDEPENDENT_AMBULATORY_CARE_PROVIDER_SITE_OTHER): Payer: Commercial Managed Care - PPO | Admitting: Family Medicine

## 2017-02-28 DIAGNOSIS — F3170 Bipolar disorder, currently in remission, most recent episode unspecified: Secondary | ICD-10-CM | POA: Diagnosis not present

## 2017-02-28 DIAGNOSIS — S96912A Strain of unspecified muscle and tendon at ankle and foot level, left foot, initial encounter: Secondary | ICD-10-CM | POA: Diagnosis not present

## 2017-02-28 MED ORDER — BUPROPION HCL ER (XL) 300 MG PO TB24: 300.0000 mg | ORAL_TABLET | Freq: Every day | ORAL | 3 refills | Status: DC

## 2017-02-28 NOTE — Assessment & Plan Note (Signed)
Patient follow-up discussed using lace up ankle support. Baby  ankle especially with patient doing a lot of running coming up. May use Advil and Aleve also.

## 2017-02-28 NOTE — Progress Notes (Signed)
   BP 117/82   Pulse 86   Wt 262 lb (118.8 kg)   LMP 06/01/2015 (Within Days)   SpO2 98%   BMI 38.69 kg/m    Subjective:    Patient ID: Emily [name], female    DOB: [birth year], 36 y.o.   MRN: [id number]  HPI: Emily [name] is a 36 y.o. female  Chief Complaint  Patient presents with  . Follow-up  . Ankle Injury    left  Patient yesterday in the gym stepped wrong felt left ankle instantly swell and become tender and swollen. Since that time his been able to walk gingerly on her ankle swelling is decreased some but still present. Range of motion especially extension and medial rotation with resistance is uncomfortable. Has pain more in the proximal medial ankle area not over the bony malleolus.  Relevant past medical, surgical, family and social history reviewed and updated as indicated. Interim medical history since our last visit reviewed. Allergies and medications reviewed and updated.  Review of Systems  Constitutional: Negative.   Respiratory: Negative.   Cardiovascular: Negative.     Per HPI unless specifically indicated above     Objective:    BP 117/82   Pulse 86   Wt 262 lb (118.8 kg)   LMP 06/01/2015 (Within Days)   SpO2 98%   BMI 38.69 kg/m   Wt Readings from Last 3 Encounters:  02/28/17 262 lb (118.8 kg)  11/27/16 262 lb 8 oz (119.1 kg)  11/14/16 261 lb (118.4 kg)    Physical Exam  Constitutional: She is oriented to person, place, and time. She appears well-developed and well-nourished.  HENT:  Head: Normocephalic and atraumatic.  Eyes: Conjunctivae and EOM are normal.  Neck: Normal range of motion.  Cardiovascular: Normal rate, regular rhythm and normal heart sounds.   Pulmonary/Chest: Effort normal and breath sounds normal.  Musculoskeletal: Normal range of motion.  Left medial ankle some tenderness with resistance to range of motion some swelling. No obvious bruising.  Neurological: She is alert and oriented to person, place, and time.  Skin: No  erythema.  Psychiatric: She has a normal mood and affect. Her behavior is normal. Judgment and thought content normal.    Results for orders placed or performed in visit on 11/14/16  Veritor Flu A/B Waived  Result Value Ref Range   Influenza A Negative Negative   Influenza B Negative Negative      Assessment & Plan:   Problem List Items Addressed This Visit      Musculoskeletal and Integument   Ankle strain, left, initial encounter    Patient follow-up discussed using lace up ankle support. Baby  ankle especially with patient doing a lot of running coming up. May use Advil and Aleve also.        Other   Bipolar disorder (HCC)    Nerves are stable but concerned with not sleeping may be starting to cycle again patient will observe          Follow up plan: Return for As scheduled.

## 2017-02-28 NOTE — Assessment & Plan Note (Signed)
Nerves are stable but concerned with not sleeping may be starting to cycle again patient will observe

## 2017-03-04 ENCOUNTER — Ambulatory Visit: Payer: Commercial Managed Care - PPO | Admitting: Family Medicine

## 2017-07-08 ENCOUNTER — Ambulatory Visit (INDEPENDENT_AMBULATORY_CARE_PROVIDER_SITE_OTHER): Payer: Commercial Managed Care - PPO | Admitting: Family Medicine

## 2017-07-08 ENCOUNTER — Encounter: Payer: Self-pay | Admitting: Family Medicine

## 2017-07-08 VITALS — BP 138/53 | HR 98 | Ht 69.0 in | Wt 243.0 lb

## 2017-07-08 DIAGNOSIS — B351 Tinea unguium: Secondary | ICD-10-CM | POA: Insufficient documentation

## 2017-07-08 DIAGNOSIS — F3170 Bipolar disorder, currently in remission, most recent episode unspecified: Secondary | ICD-10-CM | POA: Diagnosis not present

## 2017-07-08 DIAGNOSIS — G4730 Sleep apnea, unspecified: Secondary | ICD-10-CM | POA: Diagnosis not present

## 2017-07-08 DIAGNOSIS — F449 Dissociative and conversion disorder, unspecified: Secondary | ICD-10-CM | POA: Diagnosis not present

## 2017-07-08 DIAGNOSIS — Z0000 Encounter for general adult medical examination without abnormal findings: Secondary | ICD-10-CM

## 2017-07-08 MED ORDER — BUPROPION HCL ER (XL) 300 MG PO TB24: 300.0000 mg | ORAL_TABLET | Freq: Every day | ORAL | 12 refills | Status: DC

## 2017-07-08 MED ORDER — TERBINAFINE HCL 250 MG PO TABS: 250.0000 mg | ORAL_TABLET | Freq: Every day | ORAL | 2 refills | Status: DC

## 2017-07-08 NOTE — Assessment & Plan Note (Signed)
Discussed medicine treatment

## 2017-07-08 NOTE — Assessment & Plan Note (Signed)
The current medical regimen is effective;  continue present plan and medications.  

## 2017-07-08 NOTE — Assessment & Plan Note (Signed)
With weight-loss seems to be doing well. Not using CPAP

## 2017-07-08 NOTE — Progress Notes (Signed)
BP (!) 138/53   Pulse 98   Ht  (1.753 m)   Wt 243 lb (110.2 kg)   LMP 06/01/2015 (Within Days)   SpO2 98%   BMI 35.88 kg/m    Subjective:    Patient ID: Emily [name], female    DOB: 04/21/1981, 36 y.o.   MRN: [id number]  HPI: Emily JACHIM is a 36 y.o. female  Chief Complaint  Patient presents with  . Annual Exam  . Urinary Tract Infection    Frequency, Odor, Pressure   Patient with several concerns some UTI symptoms as noted above. Also has toenail fungus took some medicine several years ago which made a difference wants to get that again is developing toenail fungus  Prematurity much on all 10 toenails. Depression nerves doing stable taking bupropion without issues. Conversion disorder of arm no change continues to be the same.  Relevant past medical, surgical, family and social history reviewed and updated as indicated. Interim medical history since our last visit reviewed. Allergies and medications reviewed and updated.  Review of Systems  Constitutional: Negative.   HENT: Negative.   Eyes: Negative.   Respiratory: Negative.   Cardiovascular: Negative.   Gastrointestinal: Negative.   Endocrine: Negative.   Genitourinary: Negative.   Musculoskeletal: Negative.   Skin: Negative.   Allergic/Immunologic: Negative.   Neurological: Negative.   Hematological: Negative.   Psychiatric/Behavioral: Negative.     Per HPI unless specifically indicated above     Objective:    BP (!) 138/53   Pulse 98   Ht  (1.753 m)   Wt 243 lb (110.2 kg)   LMP 06/01/2015 (Within Days)   SpO2 98%   BMI 35.88 kg/m   Wt Readings from Last 3 Encounters:  07/08/17 243 lb (110.2 kg)  02/28/17 262 lb (118.8 kg)  11/27/16 262 lb 8 oz (119.1 kg)    Physical Exam  Constitutional: She is oriented to person, place, and time. She appears well-developed and well-nourished.  HENT:  Head: Normocephalic and atraumatic.  Right Ear: External ear normal.  Left Ear: External ear  normal.  Nose: Nose normal.  Mouth/Throat: Oropharynx is clear and moist.  Eyes: Pupils are equal, round, and reactive to light. Conjunctivae and EOM are normal.  Neck: Normal range of motion. Neck supple. Carotid bruit is not present.  Cardiovascular: Normal rate, regular rhythm and normal heart sounds.   No murmur heard. Pulmonary/Chest: Effort normal and breath sounds normal. She exhibits no mass. Right breast exhibits no mass, no skin change and no tenderness. Left breast exhibits no mass, no skin change and no tenderness. Breasts are symmetrical.  Abdominal: Soft. Bowel sounds are normal. There is no hepatosplenomegaly.  Musculoskeletal: Normal range of motion.  Neurological: She is alert and oriented to person, place, and time.  Skin: No rash noted.  Psychiatric: She has a normal mood and affect. Her behavior is normal. Judgment and thought content normal.        Assessment & Plan:   Problem List Items Addressed This Visit      Respiratory   Sleep apnea    With weight-loss seems to be doing well. Not using CPAP        Musculoskeletal and Integument   Onychomycosis    Discussed medicine treatment      Relevant Medications   terbinafine (LAMISIL) 250 MG tablet     Other   Conversion disorder    The current medical regimen is effective;  continue present  plan and medications.       Relevant Medications   buPROPion (WELLBUTRIN XL) 300 MG 24 hr tablet   Bipolar disorder (HCC)    The current medical regimen is effective;  continue present plan and medications.       Relevant Medications   buPROPion (WELLBUTRIN XL) 300 MG 24 hr tablet    Other Visit Diagnoses    PE (physical exam), annual    -  Primary   Relevant Orders   CBC with Differential/Platelet   Lipid panel   Comprehensive metabolic panel   TSH   Urinalysis, Routine w reflex microscopic       Follow up plan: Return in about 6 months (around 01/06/2018), or if symptoms worsen or fail to  improve.

## 2017-07-09 ENCOUNTER — Other Ambulatory Visit: Payer: Self-pay | Admitting: Family Medicine

## 2017-07-09 ENCOUNTER — Other Ambulatory Visit: Payer: Commercial Managed Care - PPO

## 2017-07-09 DIAGNOSIS — Z0000 Encounter for general adult medical examination without abnormal findings: Secondary | ICD-10-CM

## 2017-07-09 LAB — MICROSCOPIC EXAMINATION: RBC MICROSCOPIC, UA: NONE SEEN /HPF (ref 0–?)

## 2017-07-09 LAB — URINALYSIS, ROUTINE W REFLEX MICROSCOPIC
Bilirubin, UA: NEGATIVE
GLUCOSE, UA: NEGATIVE
KETONES UA: NEGATIVE
NITRITE UA: POSITIVE — AB
Protein, UA: NEGATIVE
RBC, UA: NEGATIVE
SPEC GRAV UA: 1.02 (ref 1.005–1.030)
UUROB: 0.2 mg/dL (ref 0.2–1.0)
pH, UA: 6 (ref 5.0–7.5)

## 2017-07-09 MED ORDER — CIPROFLOXACIN HCL 500 MG PO TABS: 500.0000 mg | ORAL_TABLET | Freq: Two times a day (BID) | ORAL | 0 refills | Status: DC

## 2017-07-09 NOTE — Progress Notes (Signed)
cipro

## 2017-07-10 LAB — COMPREHENSIVE METABOLIC PANEL
A/G RATIO: 1.5 (ref 1.2–2.2)
ALT: 15 IU/L (ref 0–32)
AST: 18 IU/L (ref 0–40)
Albumin: 4.5 g/dL (ref 3.5–5.5)
Alkaline Phosphatase: 101 IU/L (ref 39–117)
BUN/Creatinine Ratio: 8 — ABNORMAL LOW (ref 9–23)
BUN: 9 mg/dL (ref 6–20)
Bilirubin Total: 0.8 mg/dL (ref 0.0–1.2)
CALCIUM: 9.9 mg/dL (ref 8.7–10.2)
CO2: 26 mmol/L (ref 20–29)
Chloride: 102 mmol/L (ref 96–106)
Creatinine, Ser: 1.1 mg/dL — ABNORMAL HIGH (ref 0.57–1.00)
GFR calc Af Amer: 75 mL/min/{1.73_m2} (ref >59)
GFR, EST NON AFRICAN AMERICAN: 65 mL/min/{1.73_m2} (ref >59)
GLUCOSE: 86 mg/dL (ref 65–99)
Globulin, Total: 3 g/dL (ref 1.5–4.5)
POTASSIUM: 5 mmol/L (ref 3.5–5.2)
Sodium: 141 mmol/L (ref 134–144)
Total Protein: 7.5 g/dL (ref 6.0–8.5)

## 2017-07-10 LAB — CBC WITH DIFFERENTIAL/PLATELET
Basophils Absolute: 0 10*3/uL (ref 0.0–0.2)
Basos: 0 %
EOS (ABSOLUTE): 0.1 10*3/uL (ref 0.0–0.4)
EOS: 1 %
HEMATOCRIT: 43.5 % (ref 34.0–46.6)
HEMOGLOBIN: 14.5 g/dL (ref 11.1–15.9)
Immature Grans (Abs): 0 10*3/uL (ref 0.0–0.1)
Immature Granulocytes: 0 %
LYMPHS ABS: 3.2 10*3/uL — AB (ref 0.7–3.1)
Lymphs: 38 %
MCH: 30.9 pg (ref 26.6–33.0)
MCHC: 33.3 g/dL (ref 31.5–35.7)
MCV: 93 fL (ref 79–97)
MONOCYTES: 10 %
Monocytes Absolute: 0.8 10*3/uL (ref 0.1–0.9)
NEUTROS ABS: 4.2 10*3/uL (ref 1.4–7.0)
Neutrophils: 51 %
Platelets: 312 10*3/uL (ref 150–379)
RBC: 4.7 x10E6/uL (ref 3.77–5.28)
RDW: 13.5 % (ref 12.3–15.4)
WBC: 8.4 10*3/uL (ref 3.4–10.8)

## 2017-07-10 LAB — LIPID PANEL
CHOL/HDL RATIO: 4.9 ratio — AB (ref 0.0–4.4)
Cholesterol, Total: 176 mg/dL (ref 100–199)
HDL: 36 mg/dL — ABNORMAL LOW (ref >39)
LDL CALC: 114 mg/dL — AB (ref 0–99)
TRIGLYCERIDES: 131 mg/dL (ref 0–149)
VLDL Cholesterol Cal: 26 mg/dL (ref 5–40)

## 2017-07-10 LAB — TSH: TSH: 2.45 u[IU]/mL (ref 0.450–4.500)

## 2017-09-30 ENCOUNTER — Encounter: Payer: Self-pay | Admitting: Obstetrics and Gynecology

## 2017-10-01 ENCOUNTER — Encounter: Payer: Self-pay | Admitting: Unknown Physician Specialty

## 2017-10-01 ENCOUNTER — Ambulatory Visit (INDEPENDENT_AMBULATORY_CARE_PROVIDER_SITE_OTHER): Payer: Commercial Managed Care - PPO | Admitting: Unknown Physician Specialty

## 2017-10-01 VITALS — BP 116/82 | HR 68 | Temp 98.2°F | Wt 248.2 lb

## 2017-10-01 DIAGNOSIS — J4521 Mild intermittent asthma with (acute) exacerbation: Secondary | ICD-10-CM

## 2017-10-01 DIAGNOSIS — Z23 Encounter for immunization: Secondary | ICD-10-CM

## 2017-10-01 MED ORDER — ALBUTEROL SULFATE HFA 108 (90 BASE) MCG/ACT IN AERS: 2.0000 | INHALATION_SPRAY | Freq: Four times a day (QID) | RESPIRATORY_TRACT | 2 refills | Status: DC | PRN

## 2017-10-01 NOTE — Patient Instructions (Signed)
Influenza (Flu) Vaccine (Inactivated or Recombinant): What You Need to Know 1. Why get vaccinated? Influenza ("flu") is a contagious disease that spreads around the [place] every year, usually between October and May. Flu is caused by influenza viruses, and is spread mainly by coughing, sneezing, and close contact. Anyone can get flu. Flu strikes suddenly and can last several days. Symptoms vary by age, but can include:  fever/chills  sore throat  muscle aches  fatigue  cough  headache  runny or stuffy nose  Flu can also lead to pneumonia and blood infections, and cause diarrhea and seizures in children. If you have a medical condition, such as heart or lung disease, flu can make it worse. Flu is more dangerous for some people. Infants and young children, people 65 years of age and older, pregnant women, and people with certain health conditions or a weakened immune system are at greatest risk. Each year thousands of people in the [place] die from flu, and many more are hospitalized. Flu vaccine can:  keep you from getting flu,  make flu less severe if you do get it, and  keep you from spreading flu to your family and other people. 2. Inactivated and recombinant flu vaccines A dose of flu vaccine is recommended every flu season. Children 6 months through 8 years of age may need two doses during the same flu season. Everyone else needs only one dose each flu season. Some inactivated flu vaccines contain a very small amount of a mercury-based preservative called thimerosal. Studies have not shown thimerosal in vaccines to be harmful, but flu vaccines that do not contain thimerosal are available. There is no live flu virus in flu shots. They cannot cause the flu. There are many flu viruses, and they are always changing. Each year a new flu vaccine is made to protect against three or four viruses that are likely to cause disease in the upcoming flu season. But even when the  vaccine doesn't exactly match these viruses, it may still provide some protection. Flu vaccine cannot prevent:  flu that is caused by a virus not covered by the vaccine, or  illnesses that look like flu but are not.  It takes about 2 weeks for protection to develop after vaccination, and protection lasts through the flu season. 3. Some people should not get this vaccine Tell the person who is giving you the vaccine:  If you have any severe, life-threatening allergies. If you ever had a life-threatening allergic reaction after a dose of flu vaccine, or have a severe allergy to any part of this vaccine, you may be advised not to get vaccinated. Most, but not all, types of flu vaccine contain a small amount of egg protein.  If you ever had Guillain-Barr Syndrome (also called GBS). Some people with a history of GBS should not get this vaccine. This should be discussed with your doctor.  If you are not feeling well. It is usually okay to get flu vaccine when you have a mild illness, but you might be asked to come back when you feel better.  4. Risks of a vaccine reaction With any medicine, including vaccines, there is a chance of reactions. These are usually mild and go away on their own, but serious reactions are also possible. Most people who get a flu shot do not have any problems with it. Minor problems following a flu shot include:  soreness, redness, or swelling where the shot was given  hoarseness  sore,   red or itchy eyes  cough  fever  aches  headache  itching  fatigue  If these problems occur, they usually begin soon after the shot and last 1 or 2 days. More serious problems following a flu shot can include the following:  There may be a small increased risk of Guillain-Barre Syndrome (GBS) after inactivated flu vaccine. This risk has been estimated at 1 or 2 additional cases per million people vaccinated. This is much lower than the risk of severe complications from  flu, which can be prevented by flu vaccine.  Young children who get the flu shot along with pneumococcal vaccine (PCV13) and/or DTaP vaccine at the same time might be slightly more likely to have a seizure caused by fever. Ask your doctor for more information. Tell your doctor if a child who is getting flu vaccine has ever had a seizure.  Problems that could happen after any injected vaccine:  People sometimes faint after a medical procedure, including vaccination. Sitting or lying down for about 15 minutes can help prevent fainting, and injuries caused by a fall. Tell your doctor if you feel dizzy, or have vision changes or ringing in the ears.  Some people get severe pain in the shoulder and have difficulty moving the arm where a shot was given. This happens very rarely.  Any medication can cause a severe allergic reaction. Such reactions from a vaccine are very rare, estimated at about 1 in a million doses, and would happen within a few minutes to a few hours after the vaccination. As with any medicine, there is a very remote chance of a vaccine causing a serious injury or death. The safety of vaccines is always being monitored. For more information, visit: http://www.aguilar.org/ 5. What if there is a serious reaction? What should I look for? Look for anything that concerns you, such as signs of a severe allergic reaction, very high fever, or unusual behavior. Signs of a severe allergic reaction can include hives, swelling of the face and throat, difficulty breathing, a fast heartbeat, dizziness, and weakness. These would start a few minutes to a few hours after the vaccination. What should I do?  If you think it is a severe allergic reaction or other emergency that can't wait, call 9-1-1 and get the person to the nearest hospital. Otherwise, call your doctor.  Reactions should be reported to the Vaccine Adverse Event Reporting System (VAERS). Your doctor should file this report, or you  can do it yourself through the VAERS web site at www.vaers.SamedayNews.es, or by calling 6094730752. ? VAERS does not give medical advice. 6. The National Vaccine Injury Compensation Program The [employer] Vaccine Injury Compensation Program (VICP) is a federal program that was created to compensate people who may have been injured by certain vaccines. Persons who believe they may have been injured by a vaccine can learn about the program and about filing a claim by calling 458-267-6070 or visiting the [place] website at GoldCloset.com.ee. There is a time limit to file a claim for compensation. 7. How can I learn more?  Ask your healthcare provider. He or she can give you the vaccine package insert or suggest other sources of information.  Call your local or state health department.  Contact the Centers for Disease Control and Prevention (CDC): ? Call (540)164-9661 (1-800-CDC-INFO) or ? Visit CDC's website at https://gibson.com/ Vaccine Information Statement, Inactivated Influenza Vaccine (04/30/2014) This information is not intended to replace advice given to you by your health care provider. Make sure  you discuss any questions you have with your health care provider. Document Released: 07/05/2006 Document Revised: 05/31/2016 Document Reviewed: 05/31/2016 Elsevier Interactive Patient Education  2017 [employer] Influenza (Flu) Vaccine (Inactivated or Recombinant): What You Need to Know 1. Why get vaccinated? Influenza ("flu") is a contagious disease that spreads around the [place] every year, usually between October and May. Flu is caused by influenza viruses, and is spread mainly by coughing, sneezing, and close contact. Anyone can get flu. Flu strikes suddenly and can last several days. Symptoms vary by age, but can include:  fever/chills  sore throat  muscle aches  fatigue  cough  headache  runny or stuffy nose  Flu can also lead to pneumonia and blood  infections, and cause diarrhea and seizures in children. If you have a medical condition, such as heart or lung disease, flu can make it worse. Flu is more dangerous for some people. Infants and young children, people 65 years of age and older, pregnant women, and people with certain health conditions or a weakened immune system are at greatest risk. Each year thousands of people in the [place] die from flu, and many more are hospitalized. Flu vaccine can:  keep you from getting flu,  make flu less severe if you do get it, and  keep you from spreading flu to your family and other people. 2. Inactivated and recombinant flu vaccines A dose of flu vaccine is recommended every flu season. Children 6 months through 8 years of age may need two doses during the same flu season. Everyone else needs only one dose each flu season. Some inactivated flu vaccines contain a very small amount of a mercury-based preservative called thimerosal. Studies have not shown thimerosal in vaccines to be harmful, but flu vaccines that do not contain thimerosal are available. There is no live flu virus in flu shots. They cannot cause the flu. There are many flu viruses, and they are always changing. Each year a new flu vaccine is made to protect against three or four viruses that are likely to cause disease in the upcoming flu season. But even when the vaccine doesn't exactly match these viruses, it may still provide some protection. Flu vaccine cannot prevent:  flu that is caused by a virus not covered by the vaccine, or  illnesses that look like flu but are not.  It takes about 2 weeks for protection to develop after vaccination, and protection lasts through the flu season. 3. Some people should not get this vaccine Tell the person who is giving you the vaccine:  If you have any severe, life-threatening allergies. If you ever had a life-threatening allergic reaction after a dose of flu vaccine, or have a  severe allergy to any part of this vaccine, you may be advised not to get vaccinated. Most, but not all, types of flu vaccine contain a small amount of egg protein.  If you ever had Guillain-Barr Syndrome (also called GBS). Some people with a history of GBS should not get this vaccine. This should be discussed with your doctor.  If you are not feeling well. It is usually okay to get flu vaccine when you have a mild illness, but you might be asked to come back when you feel better.  4. Risks of a vaccine reaction With any medicine, including vaccines, there is a chance of reactions. These are usually mild and go away on their own, but serious reactions are also possible. Most people who get a flu   shot do not have any problems with it. Minor problems following a flu shot include:  soreness, redness, or swelling where the shot was given  hoarseness  sore, red or itchy eyes  cough  fever  aches  headache  itching  fatigue  If these problems occur, they usually begin soon after the shot and last 1 or 2 days. More serious problems following a flu shot can include the following:  There may be a small increased risk of Guillain-Barre Syndrome (GBS) after inactivated flu vaccine. This risk has been estimated at 1 or 2 additional cases per million people vaccinated. This is much lower than the risk of severe complications from flu, which can be prevented by flu vaccine.  Young children who get the flu shot along with pneumococcal vaccine (PCV13) and/or DTaP vaccine at the same time might be slightly more likely to have a seizure caused by fever. Ask your doctor for more information. Tell your doctor if a child who is getting flu vaccine has ever had a seizure.  Problems that could happen after any injected vaccine:  People sometimes faint after a medical procedure, including vaccination. Sitting or lying down for about 15 minutes can help prevent fainting, and injuries caused by a fall.  Tell your doctor if you feel dizzy, or have vision changes or ringing in the ears.  Some people get severe pain in the shoulder and have difficulty moving the arm where a shot was given. This happens very rarely.  Any medication can cause a severe allergic reaction. Such reactions from a vaccine are very rare, estimated at about 1 in a million doses, and would happen within a few minutes to a few hours after the vaccination. As with any medicine, there is a very remote chance of a vaccine causing a serious injury or death. The safety of vaccines is always being monitored. For more information, visit: http://www.aguilar.org/ 5. What if there is a serious reaction? What should I look for? Look for anything that concerns you, such as signs of a severe allergic reaction, very high fever, or unusual behavior. Signs of a severe allergic reaction can include hives, swelling of the face and throat, difficulty breathing, a fast heartbeat, dizziness, and weakness. These would start a few minutes to a few hours after the vaccination. What should I do?  If you think it is a severe allergic reaction or other emergency that can't wait, call 9-1-1 and get the person to the nearest hospital. Otherwise, call your doctor.  Reactions should be reported to the Vaccine Adverse Event Reporting System (VAERS). Your doctor should file this report, or you can do it yourself through the VAERS web site at www.vaers.SamedayNews.es, or by calling (302)776-9527. ? VAERS does not give medical advice. 6. The National Vaccine Injury Compensation Program The [employer] Vaccine Injury Compensation Program (VICP) is a federal program that was created to compensate people who may have been injured by certain vaccines. Persons who believe they may have been injured by a vaccine can learn about the program and about filing a claim by calling 213-796-0350 or visiting the [place] website at GoldCloset.com.ee. There is a time  limit to file a claim for compensation. 7. How can I learn more?  Ask your healthcare provider. He or she can give you the vaccine package insert or suggest other sources of information.  Call your local or state health department.  Contact the Centers for Disease Control and Prevention (CDC): ? Call 678-221-8021 (1-800-CDC-INFO) or ? Visit  CDC's website at BiotechRoom.com.cy Vaccine Information Statement, Inactivated Influenza Vaccine (04/30/2014) This information is not intended to replace advice given to you by your health care provider. Make sure you discuss any questions you have with your health care provider. Document Released: 07/05/2006 Document Revised: 05/31/2016 Document Reviewed: 05/31/2016 Elsevier Interactive Patient Education  2017 [employer]. Tdap Vaccine (Tetanus, Diphtheria and Pertussis): What You Need to Know 1. Why get vaccinated? Tetanus, diphtheria and pertussis are very serious diseases. Tdap vaccine can protect [place] from these diseases. And, Tdap vaccine given to pregnant women can protect newborn babies against pertussis. TETANUS (Lockjaw) is rare in the [place] today. It causes painful muscle tightening and stiffness, usually all over the body.  It can lead to tightening of muscles in the head and neck so you can't open your mouth, swallow, or sometimes even breathe. Tetanus kills about 1 out of 10 people who are infected even after receiving the best medical care.  DIPHTHERIA is also rare in the [place] today. It can cause a thick coating to form in the back of the throat.  It can lead to breathing problems, heart failure, paralysis, and death.  PERTUSSIS (Whooping Cough) causes severe coughing spells, which can cause difficulty breathing, vomiting and disturbed sleep.  It can also lead to weight loss, incontinence, and rib fractures. Up to 2 in 100 adolescents and 5 in 100 adults with pertussis are hospitalized or have complications, which could  include pneumonia or death.  These diseases are caused by bacteria. Diphtheria and pertussis are spread from person to person through secretions from coughing or sneezing. Tetanus enters the body through cuts, scratches, or wounds. Before vaccines, as many as 200,000 cases of diphtheria, 200,000 cases of pertussis, and hundreds of cases of tetanus, were reported in the [place] each year. Since vaccination began, reports of cases for tetanus and diphtheria have dropped by about 99% and for pertussis by about 80%. 2. Tdap vaccine Tdap vaccine can protect adolescents and adults from tetanus, diphtheria, and pertussis. One dose of Tdap is routinely given at age 60 or 24. People who did not get Tdap at that age should get it as soon as possible. Tdap is especially important for healthcare professionals and anyone having close contact with a baby younger than 12 months. Pregnant women should get a dose of Tdap during every pregnancy, to protect the newborn from pertussis. Infants are most at risk for severe, life-threatening complications from pertussis. Another vaccine, called Td, protects against tetanus and diphtheria, but not pertussis. A Td booster should be given every 10 years. Tdap may be given as one of these boosters if you have never gotten Tdap before. Tdap may also be given after a severe cut or burn to prevent tetanus infection. Your doctor or the person giving you the vaccine can give you more information. Tdap may safely be given at the same time as other vaccines. 3. Some people should not get this vaccine  A person who has ever had a life-threatening allergic reaction after a previous dose of any diphtheria, tetanus or pertussis containing vaccine, OR has a severe allergy to any part of this vaccine, should not get Tdap vaccine. Tell the person giving the vaccine about any severe allergies.  Anyone who had coma or long repeated seizures within 7 days after a childhood dose of DTP or  DTaP, or a previous dose of Tdap, should not get Tdap, unless a cause other than the vaccine was found. They can  still get Td.  Talk to your doctor if you: ? have seizures or another nervous system problem, ? had severe pain or swelling after any vaccine containing diphtheria, tetanus or pertussis, ? ever had a condition called Guillain-Barr Syndrome (GBS), ? aren't feeling well on the day the shot is scheduled. 4. Risks With any medicine, including vaccines, there is a chance of side effects. These are usually mild and go away on their own. Serious reactions are also possible but are rare. Most people who get Tdap vaccine do not have any problems with it. Mild problems following Tdap: (Did not interfere with activities)  Pain where the shot was given (about 3 in 4 adolescents or 2 in 3 adults)  Redness or swelling where the shot was given (about 1 person in 5)  Mild fever of at least 100.55F (up to about 1 in 25 adolescents or 1 in 100 adults)  Headache (about 3 or 4 people in 10)  Tiredness (about 1 person in 3 or 4)  Nausea, vomiting, diarrhea, stomach ache (up to 1 in 4 adolescents or 1 in 10 adults)  Chills, sore joints (about 1 person in 10)  Body aches (about 1 person in 3 or 4)  Rash, swollen glands (uncommon)  Moderate problems following Tdap: (Interfered with activities, but did not require medical attention)  Pain where the shot was given (up to 1 in 5 or 6)  Redness or swelling where the shot was given (up to about 1 in 16 adolescents or 1 in 12 adults)  Fever over 102F (about 1 in 100 adolescents or 1 in 250 adults)  Headache (about 1 in 7 adolescents or 1 in 10 adults)  Nausea, vomiting, diarrhea, stomach ache (up to 1 or 3 people in 100)  Swelling of the entire arm where the shot was given (up to about 1 in 500).  Severe problems following Tdap: (Unable to perform usual activities; required medical attention)  Swelling, severe pain, bleeding and  redness in the arm where the shot was given (rare).  Problems that could happen after any vaccine:  People sometimes faint after a medical procedure, including vaccination. Sitting or lying down for about 15 minutes can help prevent fainting, and injuries caused by a fall. Tell your doctor if you feel dizzy, or have vision changes or ringing in the ears.  Some people get severe pain in the shoulder and have difficulty moving the arm where a shot was given. This happens very rarely.  Any medication can cause a severe allergic reaction. Such reactions from a vaccine are very rare, estimated at fewer than 1 in a million doses, and would happen within a few minutes to a few hours after the vaccination. As with any medicine, there is a very remote chance of a vaccine causing a serious injury or death. The safety of vaccines is always being monitored. For more information, visit: http://floyd.org/www.cdc.gov/vaccinesafety/ 5. What if there is a serious problem? What should I look for? Look for anything that concerns you, such as signs of a severe allergic reaction, very high fever, or unusual behavior. Signs of a severe allergic reaction can include hives, swelling of the face and throat, difficulty breathing, a fast heartbeat, dizziness, and weakness. These would usually start a few minutes to a few hours after the vaccination. What should I do?  If you think it is a severe allergic reaction or other emergency that can't wait, call 9-1-1 or get the person to the nearest hospital. Otherwise,  call your doctor.  Afterward, the reaction should be reported to the Vaccine Adverse Event Reporting System (VAERS). Your doctor might file this report, or you can do it yourself through the VAERS web site at www.vaers.LAgents.no, or by calling 1-925-043-3840. ? VAERS does not give medical advice. 6. The National Vaccine Injury Compensation Program The [employer] Vaccine Injury Compensation Program (VICP) is a federal program that was  created to compensate people who may have been injured by certain vaccines. Persons who believe they may have been injured by a vaccine can learn about the program and about filing a claim by calling 1-8131649328 or visiting the VICP website at SpiritualWord.at. There is a time limit to file a claim for compensation. 7. How can I learn more?  Ask your doctor. He or she can give you the vaccine package insert or suggest other sources of information.  Call your local or state health department.  Contact the Centers for Disease Control and Prevention (CDC): ? Call 574-833-5139 (1-800-CDC-INFO) or ? Visit CDC's website at PicCapture.uy CDC Tdap Vaccine VIS (11/17/13) This information is not intended to replace advice given to you by your health care provider. Make sure you discuss any questions you have with your health care provider. Document Released: 03/11/2012 Document Revised: 05/31/2016 Document Reviewed: 05/31/2016 Elsevier Interactive Patient Education  2017 [employer].

## 2017-10-01 NOTE — Progress Notes (Signed)
BP 116/82   Pulse 68   Temp 98.2 F (36.8 C) (Oral)   Wt 248 lb 3.2 oz (112.6 kg)   LMP 06/01/2015 (Within Days)   SpO2 97%   BMI 36.65 kg/m    Subjective:    Patient ID: Emily [name], female    DOB: 07-22-1981, 37 y.o.   MRN: [id number]  HPI: Emily [name] is a 37 y.o. female  Chief Complaint  Patient presents with  . Asthma    pt states she has been experiencing exercise induced asthma    Planning on doing some running races.  Going to the gym but experiencing short of breath.  Started [place] when she was at the gym.  Has not had trouble for years and has no inhaler.  No real chest pain.  States she feels other than when she exercises.    Relevant past medical, surgical, family and social history reviewed and updated as indicated. Interim medical history since our last visit reviewed. Allergies and medications reviewed and updated.  Review of Systems  Constitutional: Negative.   HENT: Negative.   Respiratory: Negative.   Cardiovascular: Negative.   Gastrointestinal: Negative.     Per HPI unless specifically indicated above     Objective:    BP 116/82   Pulse 68   Temp 98.2 F (36.8 C) (Oral)   Wt 248 lb 3.2 oz (112.6 kg)   LMP 06/01/2015 (Within Days)   SpO2 97%   BMI 36.65 kg/m   Wt Readings from Last 3 Encounters:  10/01/17 248 lb 3.2 oz (112.6 kg)  07/08/17 243 lb (110.2 kg)  02/28/17 262 lb (118.8 kg)    Physical Exam  Constitutional: She is oriented to person, place, and time. She appears well-developed and well-nourished. No distress.  HENT:  Head: Normocephalic and atraumatic.  Eyes: Conjunctivae and lids are normal. Right eye exhibits no discharge. Left eye exhibits no discharge. No scleral icterus.  Neck: Normal range of motion. Neck supple. No JVD present. Carotid bruit is not present.  Cardiovascular: Normal rate, regular rhythm and normal heart sounds.  Pulmonary/Chest: Effort normal and breath sounds normal.  Abdominal: Normal  appearance. There is no splenomegaly or hepatomegaly.  Musculoskeletal: Normal range of motion.  Neurological: She is alert and oriented to person, place, and time.  Skin: Skin is warm, dry and intact. No rash noted. No pallor.  Psychiatric: She has a normal mood and affect. Her behavior is normal. Judgment and thought content normal.    Results for orders placed or performed in visit on 07/09/17  Microscopic Examination  Result Value Ref Range   WBC, UA 6-10 (A) 0 - 5 /hpf   RBC, UA None seen 0 - 2 /hpf   Epithelial Cells (non renal) 0-10 0 - 10 /hpf   Bacteria, UA Many (A) None seen/Few  CBC with Differential/Platelet  Result Value Ref Range   WBC 8.4 3.4 - 10.8 x10E3/uL   RBC 4.70 3.77 - 5.28 x10E6/uL   Hemoglobin 14.5 11.1 - 15.9 g/dL   Hematocrit 32.443.5 40.134.0 - 46.6 %   MCV 93 79 - 97 fL   MCH 30.9 26.6 - 33.0 pg   MCHC 33.3 31.5 - 35.7 g/dL   RDW 02.713.5 25.312.3 - 66.415.4 %   Platelets 312 150 - 379 x10E3/uL   Neutrophils 51 Not Estab. %   Lymphs 38 Not Estab. %   Monocytes 10 Not Estab. %   Eos 1 Not Estab. %  Basos 0 Not Estab. %   Neutrophils Absolute 4.2 1.4 - 7.0 x10E3/uL   Lymphocytes Absolute 3.2 (H) 0.7 - 3.1 x10E3/uL   Monocytes Absolute 0.8 0.1 - 0.9 x10E3/uL   EOS (ABSOLUTE) 0.1 0.0 - 0.4 x10E3/uL   Basophils Absolute 0.0 0.0 - 0.2 x10E3/uL   Immature Granulocytes 0 Not Estab. %   Immature Grans (Abs) 0.0 0.0 - 0.1 x10E3/uL  Lipid panel  Result Value Ref Range   Cholesterol, Total 176 100 - 199 mg/dL   Triglycerides 191 0 - 149 mg/dL   HDL 36 (L) >47 mg/dL   VLDL Cholesterol Cal 26 5 - 40 mg/dL   LDL Calculated 829 (H) 0 - 99 mg/dL   Chol/HDL Ratio 4.9 (H) 0.0 - 4.4 ratio  Comprehensive metabolic panel  Result Value Ref Range   Glucose 86 65 - 99 mg/dL   BUN 9 6 - 20 mg/dL   Creatinine, Ser 5.62 (H) 0.57 - 1.00 mg/dL   GFR calc non Af Amer 65 >59 mL/min/1.73   GFR calc Af Amer 75 >59 mL/min/1.73   BUN/Creatinine Ratio 8 (L) 9 - 23   Sodium 141 134 - 144  mmol/L   Potassium 5.0 3.5 - 5.2 mmol/L   Chloride 102 96 - 106 mmol/L   CO2 26 20 - 29 mmol/L   Calcium 9.9 8.7 - 10.2 mg/dL   Total Protein 7.5 6.0 - 8.5 g/dL   Albumin 4.5 3.5 - 5.5 g/dL   Globulin, Total 3.0 1.5 - 4.5 g/dL   Albumin/Globulin Ratio 1.5 1.2 - 2.2   Bilirubin Total 0.8 0.0 - 1.2 mg/dL   Alkaline Phosphatase 101 39 - 117 IU/L   AST 18 0 - 40 IU/L   ALT 15 0 - 32 IU/L  TSH  Result Value Ref Range   TSH 2.450 0.450 - 4.500 uIU/mL  Urinalysis, Routine w reflex microscopic  Result Value Ref Range   Specific Gravity, UA 1.020 1.005 - 1.030   pH, UA 6.0 5.0 - 7.5   Color, UA Yellow Yellow   Appearance Ur Cloudy (A) Clear   Leukocytes, UA Trace (A) Negative   Protein, UA Negative Negative/Trace   Glucose, UA Negative Negative   Ketones, UA Negative Negative   RBC, UA Negative Negative   Bilirubin, UA Negative Negative   Urobilinogen, Ur 0.2 0.2 - 1.0 mg/dL   Nitrite, UA Positive (A) Negative   Microscopic Examination See below:       Assessment & Plan:   Problem List Items Addressed This Visit      Unprioritized   Asthma    New problem isolated to exercise induced.  Will start Albuterol prior to exercise.  Consider adding ICS.        Relevant Medications   albuterol (PROVENTIL HFA;VENTOLIN HFA) 108 (90 Base) MCG/ACT inhaler    Other Visit Diagnoses    Need for influenza vaccination    -  Primary   Relevant Orders   Flu Vaccine QUAD 36+ mos IM (Completed)   Need for diphtheria-tetanus-pertussis (Tdap) vaccine       Relevant Orders   Tdap vaccine greater than or equal to 7yo IM (Completed)       Follow up plan: Return if symptoms worsen or fail to improve.

## 2017-10-01 NOTE — Assessment & Plan Note (Addendum)
New problem isolated to exercise induced.  Will start Albuterol prior to exercise.  Consider adding ICS.

## 2017-10-08 ENCOUNTER — Encounter: Payer: Self-pay | Admitting: Obstetrics and Gynecology

## 2017-10-08 ENCOUNTER — Ambulatory Visit (INDEPENDENT_AMBULATORY_CARE_PROVIDER_SITE_OTHER): Payer: Commercial Managed Care - PPO | Admitting: Obstetrics and Gynecology

## 2017-10-08 VITALS — BP 107/76 | HR 84 | Ht 69.0 in | Wt 248.1 lb

## 2017-10-08 DIAGNOSIS — Z90710 Acquired absence of both cervix and uterus: Secondary | ICD-10-CM

## 2017-10-08 DIAGNOSIS — R102 Pelvic and perineal pain: Secondary | ICD-10-CM

## 2017-10-08 DIAGNOSIS — N940 Mittelschmerz: Secondary | ICD-10-CM | POA: Diagnosis not present

## 2017-10-08 MED ORDER — NORETHINDRONE ACETATE 5 MG PO TABS: 5.0000 mg | ORAL_TABLET | Freq: Every day | ORAL | 6 refills | Status: DC

## 2017-10-08 NOTE — Progress Notes (Signed)
    GYNECOLOGY PROGRESS NOTE  Subjective:    Patient ID: Emily [name], female    DOB: 1981-06-18, 37 y.o.   MRN: [id number]  HPI  Patient is a 37 y.o. [id number] female who presents for complaints of persistent pelvic pain. She has a PMH of laparoscopic hysterectomy. She notes that she has been dealing with the pelvic pain for the last 4-6 months, as it was previously intermittent and mild.  Now notes that the pain is becoming more intense. She is also noting more pain around ovulation. Has been taking Motrin 800 mg but this has not helped recently.  She denies problems with urination orr bowel movements.  Denies nausea/vomiting.   The following portions of the patient's history were reviewed and updated as appropriate: allergies, current medications, past family history, past medical history, past social history, past surgical history and problem list.  Review of Systems Pertinent items noted in HPI and remainder of comprehensive ROS otherwise negative.   Objective:   Blood pressure 107/76, pulse 84, height 5\' 9"  (1.753 m), weight 248 lb 1.6 oz (112.5 kg), last menstrual period 06/01/2015. General appearance: alert and no distress Abdomen: soft, non-tender; bowel sounds normal; no masses,  no organomegaly Pelvic: external genitalia normal, rectovaginal septum normal.  Vagina without discharge. Cervix and uterus surgically absent. Adnexae non-palpable, nontender bilaterally.  Extremities: extremities normal, atraumatic, no cyanosis or edema Neurologic: Grossly normal   Assessment:   Pelvic pain Ovulation pain H/o hysterectomy  Plan:   - Discussed potential causes of pelvic pain, including urinary/GI issues, as well as adhesions from prior surgery, possible undiagnosed endometriosis (not noted at time of surgery).  Discussed management options, including hormonal suppression with progesterone (Aygestin), or combined OCPs, pelvic floor physical therapy, diagnostic laparoscopy with  adhesiolysis if present, and referral if necessary to other specialties (Urology/GI) if no other identifiable cause or effective treatment can be found.  Patient notes understanding.  - To f/u in 2-3 months if symptoms persist despite medication .Will treat for up to 9 months on Aygestin, then have a 3 month off interval.    Emily [name], Emily Whiters, MD [facility]

## 2017-10-10 ENCOUNTER — Encounter: Payer: Self-pay | Admitting: Obstetrics and Gynecology

## 2017-10-28 ENCOUNTER — Other Ambulatory Visit: Payer: Self-pay | Admitting: Family Medicine

## 2017-10-28 MED ORDER — OSELTAMIVIR PHOSPHATE 75 MG PO CAPS: 75.0000 mg | ORAL_CAPSULE | Freq: Two times a day (BID) | ORAL | 0 refills | Status: DC

## 2017-10-28 NOTE — Progress Notes (Signed)
miflu

## 2017-11-05 ENCOUNTER — Encounter: Payer: Self-pay | Admitting: Family Medicine

## 2017-11-15 ENCOUNTER — Encounter: Payer: Self-pay | Admitting: Obstetrics and Gynecology

## 2017-11-16 ENCOUNTER — Other Ambulatory Visit: Payer: Self-pay | Admitting: Obstetrics and Gynecology

## 2017-11-16 DIAGNOSIS — R102 Pelvic and perineal pain: Secondary | ICD-10-CM

## 2017-11-16 MED ORDER — TRAMADOL HCL 50 MG PO TABS: 50.0000 mg | ORAL_TABLET | Freq: Four times a day (QID) | ORAL | 0 refills | Status: DC | PRN

## 2017-11-19 ENCOUNTER — Ambulatory Visit (INDEPENDENT_AMBULATORY_CARE_PROVIDER_SITE_OTHER): Payer: Commercial Managed Care - PPO

## 2017-11-19 DIAGNOSIS — R102 Pelvic and perineal pain: Secondary | ICD-10-CM | POA: Diagnosis not present

## 2017-11-21 ENCOUNTER — Encounter: Payer: Self-pay | Admitting: Obstetrics and Gynecology

## 2017-11-26 ENCOUNTER — Ambulatory Visit (INDEPENDENT_AMBULATORY_CARE_PROVIDER_SITE_OTHER): Payer: Commercial Managed Care - PPO | Admitting: Obstetrics and Gynecology

## 2017-11-26 ENCOUNTER — Encounter: Payer: Self-pay | Admitting: Obstetrics and Gynecology

## 2017-11-26 VITALS — BP 122/79 | HR 102 | Ht 69.0 in | Wt 244.2 lb

## 2017-11-26 DIAGNOSIS — N760 Acute vaginitis: Secondary | ICD-10-CM

## 2017-11-26 DIAGNOSIS — R102 Pelvic and perineal pain: Secondary | ICD-10-CM | POA: Diagnosis not present

## 2017-11-26 DIAGNOSIS — E668 Other obesity: Secondary | ICD-10-CM | POA: Diagnosis not present

## 2017-11-26 DIAGNOSIS — B9689 Other specified bacterial agents as the cause of diseases classified elsewhere: Secondary | ICD-10-CM

## 2017-11-26 MED ORDER — NORETHINDRONE ACET-ETHINYL EST 1-20 MG-MCG PO TABS: 1.0000 | ORAL_TABLET | Freq: Every day | ORAL | 3 refills | Status: DC

## 2017-11-26 MED ORDER — METRONIDAZOLE 500 MG PO TABS
500.0000 mg | ORAL_TABLET | Freq: Two times a day (BID) | ORAL | 0 refills | Status: DC
Start: 2017-11-26 — End: 2017-12-16

## 2017-11-26 MED ORDER — FLUCONAZOLE 150 MG PO TABS: 150.0000 mg | ORAL_TABLET | Freq: Once | ORAL | 3 refills | Status: AC

## 2017-11-26 NOTE — Progress Notes (Signed)
Pt is having pain in the ovary area x 2 months. Pain is continually and do not stop.

## 2017-11-26 NOTE — Progress Notes (Signed)
GYNECOLOGY PROGRESS NOTE  Subjective:    Patient ID: Emily [name], female    DOB: 1981/09/19, 37 y.o.   MRN: [id number]  HPI  Patient is a 37 y.o. [id number] female who presents for continued complaints of pelvic pain. She has a PMH of laparoscopic hysterectomy. She notes that she has been dealing with the pelvic pain for the last 4-6 months, as it was previously intermittent and mild.  Now notes that the pain is becoming more intense. She was seen in January for complaints and was started on Aygestin for suppression therapy. Patient notes that after use of the medication her pelvic pain became even worse so she discontinued. She had a recent ultrasound which noted no pelvic masses (however was unable to visualize adnexa due to overlying bowel gas).  Comes to discuss other options. Notes that she has been thinking about removal of both of her ovaries.   The following portions of the patient's history were reviewed and updated as appropriate: allergies, current medications, past family history, past medical history, past social history, past surgical history and problem list.  Review of Systems Pertinent items noted in HPI and remainder of comprehensive ROS otherwise negative.   Objective:   Blood pressure 122/79, pulse (!) 102, height 5\' 9"  (1.753 m), weight 244 lb 3.2 oz (110.8 kg), last menstrual period 06/01/2015. Body mass index is 36.06 kg/m.  General appearance: alert and no distress Abdomen: normal findings: bowel sounds normal, no masses palpable and sosft and abnormal findings:  mild tenderness in the lower abdomen Pelvic: external genitalia normal, rectovaginal septum normal.  Vagina with moderate amount of thin white discharge and small amount of curdy white discharge. Cervix normal appearing, no lesions and no motion tenderness. Vaginal tenderness noted on bimanual exam.   Uterus and cervix surgically absent.  Unable to palpate adnexa bilaterally due   Extremities: extremities  normal, atraumatic, no cyanosis or edema Neurologic: Grossly normal   Microscopic wet-mount exam shows epithelial cells., numerous clue cells present.  Few hyphae. No trichomonads.    Imaging:  ULTRASOUND REPORT  Location: [facility] Date of Service:  11/19/2017   Indications: Pelvic Pain; S/P partial hysterectomy Findings:  The uterus is surgically absent.  Neither ovary was visualized due to excessive overlying bowel gas. Survey of the adnexa demonstrates no adnexal masses. There is no free fluid in the cul de sac.  Impression: 1. S/P partial hysterectomy. 2. Neither ovary was visualized. 3. Excessive bowel gas noted within pelvis.  Recommendations: 1.Clinical correlation with the patient's History and Physical Exam.   Kari BaarsJill Long, RDMS   Assessment:   Chronic pelvic pain Bacterial vaginosis Moderate obesity  Plan:   - Again discussed potential causes of pelvic pain, including urinary/GU, as well as adhesions from prior surgery, possible undiagnosed endometriosis (not noted at time of surgery).  Patient now desiring laparoscopy with adhesiolysis if present and bilateral oophorectomy.  Discussed less invasive options to try first, including switching from Aygestin to a combined OCP.  Patient with h/o migraines (although without aura).  WIll prescribe Junel 1-20 mg. Patient denies bowel problems, notes regular bowel movements. Denies urinary discomfort.  Advised patien to try medication for at least 1 month. If no iiprovement in symptoms, can consider surgical management at that time.  Discussed with patient that if she undergoes bilateral oophorectomy that this will place her in surgical menopause, and she may still require some form of hormonal therapy. She states that she would be ok to deal  with this as long as she did not have to continue dealing with the pain. Is currently taking Tramdol as needed for pain.    - Bacterial vaginosis diagnosed on  today's exam. Will prescribe Flagyl PO x 7 days (see orders).  Patient also prescribed Diflucan for prophylaxis of potential yeast infection.    A total of 15 minutes were spent face-to-face with the patient during this encounter and over half of that time dealt with counseling and coordination of care.  Hildred Laser, MD [facility]

## 2017-11-26 NOTE — Patient Instructions (Addendum)

## 2017-12-16 ENCOUNTER — Encounter: Payer: Self-pay | Admitting: Family Medicine

## 2017-12-16 ENCOUNTER — Ambulatory Visit: Payer: Self-pay

## 2017-12-16 ENCOUNTER — Ambulatory Visit (INDEPENDENT_AMBULATORY_CARE_PROVIDER_SITE_OTHER): Payer: Commercial Managed Care - PPO | Admitting: Family Medicine

## 2017-12-16 VITALS — BP 106/70 | HR 83 | Temp 97.7°F | Ht 69.0 in | Wt 245.9 lb

## 2017-12-16 DIAGNOSIS — R42 Dizziness and giddiness: Secondary | ICD-10-CM

## 2017-12-16 DIAGNOSIS — N390 Urinary tract infection, site not specified: Secondary | ICD-10-CM | POA: Diagnosis not present

## 2017-12-16 LAB — MICROSCOPIC EXAMINATION

## 2017-12-16 LAB — UA/M W/RFLX CULTURE, ROUTINE
Bilirubin, UA: NEGATIVE
GLUCOSE, UA: NEGATIVE
KETONES UA: NEGATIVE
Nitrite, UA: NEGATIVE
Protein, UA: NEGATIVE
SPEC GRAV UA: 1.015 (ref 1.005–1.030)
UUROB: 0.2 mg/dL (ref 0.2–1.0)
pH, UA: 5.5 (ref 5.0–7.5)

## 2017-12-16 MED ORDER — SULFAMETHOXAZOLE-TRIMETHOPRIM 800-160 MG PO TABS: 1.0000 | ORAL_TABLET | Freq: Two times a day (BID) | ORAL | 0 refills | Status: DC

## 2017-12-16 MED ORDER — MECLIZINE HCL 25 MG PO TABS: 25.0000 mg | ORAL_TABLET | Freq: Three times a day (TID) | ORAL | 0 refills | Status: DC | PRN

## 2017-12-16 NOTE — Telephone Encounter (Signed)
Pt. Reports she has been "having a lot of UTI'S recently." Reports having pain,burning,frequency and dizziness. Appointment made for today by the agent. Reason for Disposition . Urinating more frequently than usual (i.e., frequency)  Answer Assessment - Initial Assessment Questions 1. SYMPTOM: "What's the main symptom you're concerned about?" (e.g., frequency, incontinence)     Burning, frequency and dizziness 2. ONSET: "When did the  ________  start?"     Started last week 3. PAIN: "Is there any pain?" If so, ask: "How bad is it?" (Scale: 1-10; mild, moderate, severe)     Yes 4. CAUSE: "What do you think is causing the symptoms?"     UTI 5. OTHER SYMPTOMS: "Do you have any other symptoms?" (e.g., fever, flank pain, blood in urine, pain with urination)     No 6. PREGNANCY: "Is there any chance you are pregnant?" "When was your last menstrual period?"     No  Protocols used: [facility]

## 2017-12-16 NOTE — Progress Notes (Signed)
BP 106/70 (BP Location: Left Arm, Patient Position: Sitting, Cuff Size: Large)   Pulse 83   Temp 97.7 F (36.5 C) (Oral)   Ht 5\' 9"  (1.753 m)   Wt 245 lb 14.4 oz (111.5 kg)   LMP 06/01/2015 (Within Days)   SpO2 97%   BMI 36.31 kg/m    Subjective:    Patient ID: Emily [name], female    DOB: 09-22-1981, 37 y.o.   MRN: [id number]  HPI: Emily [name] is a 37 y.o. female  Chief Complaint  Patient presents with  . Dysuria    Ongoing x's 2 week. Patient thought antibiotics was causing discoloration.   . Cloudy Urine  . Urine Odor  . Dizziness    Dizziness x's 2 days.   Cloudy, malodorous urine and dysuria for about 2 weeks. Denies N/V, abdominal pain, fevers, back pain, hematuria. Drinking lots of water. Has not tried anything OTC. States today she's starting to feel much better.   Also now having dizziness for about 2 days.Feels like the room is spinning at times but other times just feeling woozy. This has happened to her once in the past from dehydration and a severe UTI. Does have a hx of vertigo years ago but states this does not feel similar. Denies CP, SOB, syncope, palpitations, decreased water intake.   Past Medical History:  Diagnosis Date  . Anemia   . 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 Bipolar 1 disorder (HCC)   . Conversion disorder   . Depression   . Insomnia   . Migraines   . MRSA infection 2010  . Ovarian cyst    left 2.5cm  . Sleep apnea   . Vitamin B 12 deficiency   . Vitamin D deficiency    Social History   Socioeconomic History  . Marital status: Married    Spouse name: Not on file  . Number of children: Not on file  . Years of education: Not on file  . Highest education level: Not on file  Occupational History  . Not on file  Social Needs  . Financial resource strain: Not on file  . Food insecurity:    Worry: Not on file    Inability: Not on file  . Transportation needs:    Medical: Not on file    Non-medical: Not on file    Tobacco Use  . Smoking status: Former Smoker    Packs/day: 1.00    Years: 10.00    Pack years: 10.00    Types: Cigarettes    Last attempt to quit: 09/25/2003    Years since quitting: 14.2  . Smokeless tobacco: Never Used  Substance and Sexual Activity  . Alcohol use: Yes    Alcohol/week: 0.0 - 0.6 oz    Comment: pt states she is a social drinker  . Drug use: No  . Sexual activity: Yes    Birth control/protection: Surgical  Lifestyle  . Physical activity:    Days per week: Not on file    Minutes per session: Not on file  . Stress: Not on file  Relationships  . Social connections:    Talks on phone: Not on file    Gets together: Not on file    Attends religious service: Not on file    Active member of club or organization: Not on file    Attends meetings of clubs or organizations: Not on file    Relationship status: Not on file  .  Intimate partner violence:    Fear of current or ex partner: Not on file    Emotionally abused: Not on file    Physically abused: Not on file    Forced sexual activity: Not on file  Other Topics Concern  . Not on file  Social History Narrative  . Not on file   Relevant past medical, surgical, family and social history reviewed and updated as indicated. Interim medical history since our last visit reviewed. Allergies and medications reviewed and updated.  Review of Systems  Per HPI unless specifically indicated above     Objective:    BP 106/70 (BP Location: Left Arm, Patient Position: Sitting, Cuff Size: Large)   Pulse 83   Temp 97.7 F (36.5 C) (Oral)   Ht 5\' 9"  (1.753 m)   Wt 245 lb 14.4 oz (111.5 kg)   LMP 06/01/2015 (Within Days)   SpO2 97%   BMI 36.31 kg/m   Wt Readings from Last 3 Encounters:  12/16/17 245 lb 14.4 oz (111.5 kg)  11/26/17 244 lb 3.2 oz (110.8 kg)  10/08/17 248 lb 1.6 oz (112.5 kg)    Physical Exam  Constitutional: She is oriented to person, place, and time. She appears well-developed and well-nourished.   HENT:  Head: Atraumatic.  Eyes: Pupils are equal, round, and reactive to light. Conjunctivae are normal.  Neck: Normal range of motion.  Cardiovascular: Normal rate and normal heart sounds.  Pulmonary/Chest: Effort normal and breath sounds normal. No respiratory distress.  Abdominal: Soft. Bowel sounds are normal. She exhibits no distension. There is no tenderness.  Musculoskeletal: Normal range of motion. She exhibits no tenderness (No CVA tenderness b/l).  Neurological: She is alert and oriented to person, place, and time. No cranial nerve deficit. She exhibits normal muscle tone. Coordination normal.  Skin: Skin is warm and dry.  Psychiatric: She has a normal mood and affect. Her behavior is normal.  Nursing note and vitals reviewed.  Results for orders placed or performed in visit on 12/16/17  Microscopic Examination  Result Value Ref Range   WBC, UA 0-5 0 - 5 /hpf   RBC, UA 0-2 0 - 2 /hpf   Epithelial Cells (non renal) 0-10 0 - 10 /hpf   Bacteria, UA Few None seen/Few  UA/M w/rflx Culture, Routine  Result Value Ref Range   Specific Gravity, UA 1.015 1.005 - 1.030   pH, UA 5.5 5.0 - 7.5   Color, UA Yellow Yellow   Appearance Ur Cloudy (A) Clear   Leukocytes, UA Trace (A) Negative   Protein, UA Negative Negative/Trace   Glucose, UA Negative Negative   Ketones, UA Negative Negative   RBC, UA Trace (A) Negative   Bilirubin, UA Negative Negative   Urobilinogen, Ur 0.2 0.2 - 1.0 mg/dL   Nitrite, UA Negative Negative   Microscopic Examination See below:       Assessment & Plan:   Problem List Items Addressed This Visit    None    Visit Diagnoses    Acute lower UTI    -  Primary   U/A with trace leuks, given sxs will tx with bactrim and monitor for improvement. Retest if sxs worsening or not improving   Relevant Medications   sulfamethoxazole-trimethoprim (BACTRIM DS,SEPTRA DS) 800-160 MG tablet   Other Relevant Orders   UA/M w/rflx Culture, Routine (Completed)    Dizziness       Vitals stable, appears well hydrated and in no distress. Will tx with meclizine, rest, push  fluids. Strict return precautions given       Follow up plan: Return if symptoms worsen or fail to improve.

## 2017-12-19 ENCOUNTER — Encounter: Payer: Self-pay | Admitting: Obstetrics and Gynecology

## 2017-12-19 NOTE — Patient Instructions (Signed)
Follow up as needed

## 2018-01-06 ENCOUNTER — Ambulatory Visit (INDEPENDENT_AMBULATORY_CARE_PROVIDER_SITE_OTHER): Payer: Commercial Managed Care - PPO | Admitting: Obstetrics and Gynecology

## 2018-01-06 ENCOUNTER — Ambulatory Visit: Payer: Commercial Managed Care - PPO | Admitting: Family Medicine

## 2018-01-06 ENCOUNTER — Encounter: Payer: Self-pay | Admitting: Obstetrics and Gynecology

## 2018-01-06 VITALS — BP 132/83 | HR 66 | Ht 69.0 in | Wt 253.0 lb

## 2018-01-06 DIAGNOSIS — R102 Pelvic and perineal pain: Secondary | ICD-10-CM | POA: Diagnosis not present

## 2018-01-06 DIAGNOSIS — Z90710 Acquired absence of both cervix and uterus: Secondary | ICD-10-CM | POA: Diagnosis not present

## 2018-01-06 DIAGNOSIS — E668 Other obesity: Secondary | ICD-10-CM | POA: Diagnosis not present

## 2018-01-06 NOTE — Progress Notes (Signed)
Pt is doing well.

## 2018-01-06 NOTE — Progress Notes (Signed)
    GYNECOLOGY PROGRESS NOTE  Subjective:    Patient ID: Emily [name], female    DOB: 1981/07/20, 37 y.o.   MRN: [id number]  HPI  Patient is a 37 y.o. [id number] female who presents for discussion of surgery. Patient with h/o LAVH in 2016.  For the past year she has been experiencing intermittent pelvic pain, usually managed with Ibuprofen or occasionally Tramadol prn, however over the past 2 months the pain has worsened to the point where Tramadol no longer controls pain.  Was usually left sided pain when it occurred, however now patient notes bilaterally in the pelvis, with pain alternating sides.  She has attempted the use of Aygestin, and Danazol for possible undiagnosed endometriosis, however medications have made symptoms worse. She had previously been on Estradiol for vasomotor symptoms and decreased libido. Notes regular bowel movements.     The following portions of the patient's history were reviewed and updated as appropriate: allergies, current medications, past family history, past medical history, past social history, past surgical history and problem list.  Review of Systems Pertinent items noted in HPI and remainder of comprehensive ROS otherwise negative.   Objective:   Blood pressure 132/83, pulse 66, height 5\' 9"  (1.753 m), weight 253 lb (114.8 kg), last menstrual period 06/01/2015. Body mass index is 37.36 kg/m.  General appearance: alert, no distress and moderately obese Abdomen: normal findings: bowel sounds normal, no masses palpable and soft and abnormal findings:  mild tenderness in the lower abdomen Pelvic: deferred Extremities: extremities normal, atraumatic, no cyanosis or edema.  Right foot in orthopedic boot (notes recent h/o stress fracture) Neurologic: Grossly normal   Assessment:   Pelvic pain H/o hysterectomy Moderate obesity  Plan:   - Patient desires surgical management with bilateral oophorectomy. Has past history of LAVH with bilateral  salpingectomy.  The risks of surgery were discussed in detail with the patient including but not limited to: bleeding which may require transfusion or reoperation; infection which may require prolonged hospitalization or re-hospitalization and antibiotic therapy; injury to bowel, bladder, ureters and major vessels or other surrounding organs; need for additional procedures including laparotomy; thromboembolic phenomenon, incisional problems and other postoperative or anesthesia complications. Risks and benefits of ovarian removal discussed including but not limited to: immediate menopause, increased risk of all cause mortality, mood swings, night sweats, hot flashes, bone loss, loss of cardioprotection all discussed.  Patient still desires removal of ovaries.  Likelihood of success of surgery in ending pelvic pain is roughly 60%.  The postoperative expectations were also discussed in detail. The patient also understands the alternative treatment options which were discussed in full. All questions were answered.  She was told that she will be contacted by our surgical scheduler regarding the time and date of her surgery; routine preoperative instructions of having nothing to eat or drink after midnight on the day prior to surgery and also coming to the hospital 1.5 hours prior to her time of surgery were also emphasized.  She was told she may be called for a preoperative appointment about a week prior to surgery and will be given further preoperative instructions at that visit. Printed patient education handouts about the procedure were given to the patient to review at home. Will schedule for 01/20/2018.    A total of 15 minutes were spent face-to-face with the patient during this encounter and over half of that time dealt with counseling and coordination of care.   Hildred Laserherry, Danaiya Steadman, MD [facility]

## 2018-01-06 NOTE — H&P (Signed)
@[facility] @   GYNECOLOGY PREOPERATIVE HISTORY AND PHYSICAL   Subjective:  Emily [name] is a 37 y.o. [phone] here for surgical management of chronic pelvic pain. No significant preoperative concerns except for moderate obesity.  Proposed surgery: Laparoscopic bilateral oophorectomy   Pertinent Gynecological History: Menses: none. S/p hysterectomy Last mammogram: not applicable  Last pap: normal Date: 2016   Past Medical History:  Diagnosis Date  . Anemia   . 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 Bipolar 1 disorder (HCC)   . Conversion disorder   . Depression   . Insomnia   . Migraines   . MRSA infection 2010  . Ovarian cyst    left 2.5cm  . Sleep apnea   . Vitamin B 12 deficiency   . Vitamin D deficiency      Past Surgical History:  Procedure Laterality Date  . ABDOMINAL HYSTERECTOMY    . BUNIONECTOMY    . HAMMER TOE SURGERY    . HAND SURGERY  2014   cyst removed  . INTRAUTERINE DEVICE (IUD) INSERTION N/A 05/09/2015   Procedure: INTRAUTERINE DEVICE (IUD) INSERTION;  Surgeon: Hildred Laser, MD;  Location: [facility];  Service: Gynecology;  Laterality: N/A;  . LAPAROSCOPIC ASSISTED VAGINAL HYSTERECTOMY N/A 06/20/2015   Procedure: LAPAROSCOPIC ASSISTED VAGINAL HYSTERECTOMY, BILATERAL SALPINGECTOMY, CYSTOSCOPY, IUD REMOVAL ;  Surgeon: Hildred Laser, MD;  Location: [facility];  Service: Gynecology;  Laterality: N/A;  . LAPAROSCOPIC TUBAL LIGATION Bilateral 05/09/2015   Procedure: LAPAROSCOPIC TUBAL LIGATION;  Surgeon: Hildred Laser, MD;  Location: [facility];  Service: Gynecology;  Laterality: Bilateral;  . NASAL SINUS SURGERY    . TUBAL LIGATION  2007     OB History  Gravida Para Term Preterm AB Living  3 3 3     3   SAB TAB Ectopic Multiple Live Births          3    # Outcome Date GA Lbr Len/2nd Weight Sex Delivery Anes PTL Lv  3 Term 2007   8 lb 1.6 oz (3.674 kg) M Vag-Spont   LIV  2 Term 2004   8 lb 4.8 oz (3.765 kg) F Vag-Spont   LIV  1 Term 2002   8 lb 1.8 oz  (3.679 kg) F Vag-Spont   LIV     Family History  Problem Relation Age of Onset  . Bipolar disorder Mother   . Anxiety disorder Mother   . Cancer Mother        brain  . Glaucoma Mother   . Heart disease Father   . Diabetes Father   . Cancer Father   . Bipolar disorder Sister   . Schizophrenia Sister   . Cancer Maternal Grandmother        lung  . Heart disease Maternal Grandfather   . Breast cancer Brother   . Ovarian cancer Neg Hx      Social History   Socioeconomic History  . Marital status: Married    Spouse name: Not on file  . Number of children: Not on file  . Years of education: Not on file  . Highest education level: Not on file  Occupational History  . Not on file  Social Needs  . Financial resource strain: Not on file  . Food insecurity:    Worry: Not on file    Inability: Not on file  . Transportation needs:    Medical: Not on file    Non-medical: Not on file  Tobacco Use  . Smoking status:  Former Smoker    Packs/day: 1.00    Years: 10.00    Pack years: 10.00    Types: Cigarettes    Last attempt to quit: 09/25/2003    Years since quitting: 14.2  . Smokeless tobacco: Never Used  Substance and Sexual Activity  . Alcohol use: Yes    Alcohol/week: 0.0 - 0.6 oz    Comment: pt states she is a social drinker  . Drug use: No  . Sexual activity: Yes    Birth control/protection: Surgical  Lifestyle  . Physical activity:    Days per week: Not on file    Minutes per session: Not on file  . Stress: Not on file  Relationships  . Social connections:    Talks on phone: Not on file    Gets together: Not on file    Attends religious service: Not on file    Active member of club or organization: Not on file    Attends meetings of clubs or organizations: Not on file    Relationship status: Not on file  . Intimate partner violence:    Fear of current or ex partner: Not on file    Emotionally abused: Not on file    Physically abused: Not on file    Forced  sexual activity: Not on file  Other Topics Concern  . Not on file  Social History Narrative  . Not on file     Current Outpatient Medications on File Prior to Visit  Medication Sig Dispense Refill  . albuterol (PROVENTIL HFA;VENTOLIN HFA) 108 (90 Base) MCG/ACT inhaler Inhale 2 puffs into the lungs every 6 (six) hours as needed for wheezing or shortness of breath. 1 Inhaler 2  . buPROPion (WELLBUTRIN XL) 300 MG 24 hr tablet Take 1 tablet (300 mg total) by mouth daily. 30 tablet 12  . fluticasone (FLONASE) 50 MCG/ACT nasal spray Place 2 sprays into both nostrils daily as needed for allergies or rhinitis.    Marland Kitchen ibuprofen (ADVIL,MOTRIN) 800 MG tablet Take 1 tablet (800 mg total) by mouth every 8 (eight) hours as needed. 60 tablet 1  . traMADol (ULTRAM) 50 MG tablet Take 1 tablet (50 mg total) by mouth every 6 (six) hours as needed for moderate pain (may take up to 2 tablets every 6 hours). 60 tablet 0  . meclizine (ANTIVERT) 25 MG tablet Take 1 tablet (25 mg total) by mouth 3 (three) times daily as needed for dizziness. (Patient not taking: Reported on 01/06/2018) 30 tablet 0  . sulfamethoxazole-trimethoprim (BACTRIM DS,SEPTRA DS) 800-160 MG tablet Take 1 tablet by mouth 2 (two) times daily. (Patient not taking: Reported on 01/06/2018) 6 tablet 0   No current facility-administered medications on file prior to visit.     Allergies  Allergen Reactions  . Codeine Diarrhea and Nausea And Vomiting     Review of Systems Constitutional: No recent fever/chills/sweats Respiratory: No recent cough/bronchitis Cardiovascular: No chest pain Gastrointestinal: No recent nausea/vomiting/diarrhea Genitourinary: No UTI symptoms Hematologic/lymphatic:No history of coagulopathy or recent blood thinner use    Objective:   Blood pressure 132/83, pulse 66, height 5\' 9"  (1.753 m), weight 253 lb (114.8 kg), last menstrual period 06/01/2015.   Body mass index is 37.36 kg/m.  CONSTITUTIONAL: Well-developed,  well-nourished female in no acute distress. Moderate obesity.  HENT:  Normocephalic, atraumatic, External right and left ear normal. Oropharynx is clear and moist EYES: Conjunctivae and EOM are normal. Pupils are equal, round, and reactive to light. No scleral icterus.  NECK:  Normal range of motion, supple, no masses SKIN: Skin is warm and dry. No rash noted. Not diaphoretic. No erythema. No pallor. NEUROLOGIC: Alert and oriented to person, place, and time. Normal reflexes, muscle tone coordination. No cranial nerve deficit noted. PSYCHIATRIC: Normal mood and affect. Normal behavior. Normal judgment and thought content. CARDIOVASCULAR: Normal heart rate noted, regular rhythm RESPIRATORY: Effort and breath sounds normal, no problems with respiration noted ABDOMEN: Soft, nontender, nondistended. PELVIC: Deferred MUSCULOSKELETAL: Normal range of motion. No edema and no tenderness. 2+ distal pulses.    Labs: Lab Results  Component Value Date   WBC 8.4 07/09/2017   HGB 14.5 07/09/2017   HCT 43.5 07/09/2017   MCV 93 07/09/2017   PLT 312 07/09/2017     Imaging Studies: ULTRASOUND REPORT  Location: [facility] Date of Service:  11/19/2017   Indications: Pelvic Pain; S/P partial hysterectomy Findings:  The uterus is surgically absent.  Neither ovary was visualized due to excessive overlying bowel gas. Survey of the adnexa demonstrates no adnexal masses. There is no free fluid in the cul de sac.  Impression: 1. S/P partial hysterectomy. 2. Neither ovary was visualized. 3. Excessive bowel gas noted within pelvis.  Recommendations: 1.Clinical correlation with the patient's History and Physical Exam.   Kari BaarsJill Long, RDMS   Assessment:    Pelvic pain H/o prior hysterectomy ([facility]) Moderate obesity   Plan:    Counseling: Procedure, risks, reasons, benefits and complications (including injury to bowel, bladder, major blood vessel, ureter, bleeding,  possibility of transfusion, infection, or fistula formation) reviewed in detail. Likelihood of success in alleviating the patient's condition was discussed. Routine postoperative instructions will be reviewed with the patient and her family in detail after surgery.  Risks and benefits of ovarian removal discussed including but not limited to: increased risk of bleeding due to additional surgery,  immediate menopause, increased risk of all cause mortality, mood swings, night sweats, hot flashes, bone loss, loss of cardioprotection all discussed.  Patient still desires removal of ovaries.  Likelihood of success of surgery in ending pelvic pain is roughly 60%. The patient concurred with the proposed plan, giving informed written consent for the surgery.   Preop testing ordered.   Instructions reviewed, including NPO after midnight.   Hildred Laserherry, Charmaine Placido, MD [facility]

## 2018-01-13 ENCOUNTER — Ambulatory Visit: Payer: Commercial Managed Care - PPO | Admitting: Family Medicine

## 2018-01-20 ENCOUNTER — Ambulatory Visit
Admission: RE | Admit: 2018-01-20 | Discharge: 2018-01-20 | Disposition: A | Discharge status: Home / Self Care | Payer: Commercial Managed Care - PPO | Source: Ambulatory Visit | Attending: Obstetrics and Gynecology | Admitting: Obstetrics and Gynecology

## 2018-01-20 ENCOUNTER — Ambulatory Visit: Payer: Commercial Managed Care - PPO | Admitting: Certified Registered Nurse Anesthetist

## 2018-01-20 ENCOUNTER — Encounter
Admission: RE | Disposition: A | Discharge status: Home / Self Care | Payer: Self-pay | Source: Ambulatory Visit | Attending: Obstetrics and Gynecology

## 2018-01-20 ENCOUNTER — Encounter: Payer: Self-pay | Admitting: *Deleted

## 2018-01-20 DIAGNOSIS — G4730 Sleep apnea, unspecified: Secondary | ICD-10-CM | POA: Insufficient documentation

## 2018-01-20 DIAGNOSIS — F319 Bipolar disorder, unspecified: Secondary | ICD-10-CM | POA: Insufficient documentation

## 2018-01-20 DIAGNOSIS — N8311 Corpus luteum cyst of right ovary: Secondary | ICD-10-CM | POA: Diagnosis not present

## 2018-01-20 DIAGNOSIS — N736 Female pelvic peritoneal adhesions (postinfective): Secondary | ICD-10-CM | POA: Diagnosis not present

## 2018-01-20 DIAGNOSIS — Z6838 Body mass index (BMI) 38.0-38.9, adult: Secondary | ICD-10-CM | POA: Insufficient documentation

## 2018-01-20 DIAGNOSIS — N8312 Corpus luteum cyst of left ovary: Secondary | ICD-10-CM | POA: Diagnosis present

## 2018-01-20 DIAGNOSIS — Z79899 Other long term (current) drug therapy: Secondary | ICD-10-CM | POA: Insufficient documentation

## 2018-01-20 DIAGNOSIS — G8929 Other chronic pain: Secondary | ICD-10-CM

## 2018-01-20 DIAGNOSIS — R102 Pelvic and perineal pain: Secondary | ICD-10-CM

## 2018-01-20 DIAGNOSIS — J45909 Unspecified asthma, uncomplicated: Secondary | ICD-10-CM | POA: Insufficient documentation

## 2018-01-20 DIAGNOSIS — Z87891 Personal history of nicotine dependence: Secondary | ICD-10-CM | POA: Diagnosis not present

## 2018-01-20 DIAGNOSIS — Z90722 Acquired absence of ovaries, bilateral: Secondary | ICD-10-CM

## 2018-01-20 LAB — CBC
HEMATOCRIT: 40.4 % (ref 35.0–47.0)
Hemoglobin: 14 g/dL (ref 12.0–16.0)
MCH: 30.8 pg (ref 26.0–34.0)
MCHC: 34.6 g/dL (ref 32.0–36.0)
MCV: 89.1 fL (ref 80.0–100.0)
PLATELETS: 271 10*3/uL (ref 150–440)
RBC: 4.54 MIL/uL (ref 3.80–5.20)
RDW: 13.7 % (ref 11.5–14.5)
WBC: 9.7 10*3/uL (ref 3.6–11.0)

## 2018-01-20 LAB — TYPE AND SCREEN
ABO/RH(D): O POS
Antibody Screen: NEGATIVE

## 2018-01-20 SURGERY — OOPHORECTOMY, LAPAROSCOPIC
Anesthesia: General | Laterality: Bilateral | Wound class: Clean

## 2018-01-20 MED ORDER — FENTANYL CITRATE (PF) 100 MCG/2ML IJ SOLN
INTRAMUSCULAR | Status: AC
  Filled 2018-01-20: qty 2

## 2018-01-20 MED ORDER — LACTATED RINGERS IV SOLN
INTRAVENOUS | Status: DC
  Administered 2018-01-20: 08:00:00 via INTRAVENOUS

## 2018-01-20 MED ORDER — BUPIVACAINE HCL 0.5 % IJ SOLN
INTRAMUSCULAR | Status: DC | PRN
  Administered 2018-01-20: 4 mL
  Administered 2018-01-20: 18 mL

## 2018-01-20 MED ORDER — DEXAMETHASONE SODIUM PHOSPHATE 10 MG/ML IJ SOLN
INTRAMUSCULAR | Status: AC
  Filled 2018-01-20: qty 1

## 2018-01-20 MED ORDER — ACETAMINOPHEN NICU IV SYRINGE 10 MG/ML
INTRAVENOUS | Status: AC
  Filled 2018-01-20: qty 2

## 2018-01-20 MED ORDER — FAMOTIDINE 20 MG PO TABS
20.0000 mg | ORAL_TABLET | Freq: Once | ORAL | Status: AC
  Administered 2018-01-20: 20 mg via ORAL

## 2018-01-20 MED ORDER — ACETAMINOPHEN 10 MG/ML IV SOLN
INTRAVENOUS | Status: DC | PRN
  Administered 2018-01-20: 1000 mg via INTRAVENOUS

## 2018-01-20 MED ORDER — DOCUSATE SODIUM 100 MG PO CAPS: 100.0000 mg | ORAL_CAPSULE | Freq: Two times a day (BID) | ORAL | 2 refills | Status: DC | PRN

## 2018-01-20 MED ORDER — BUPIVACAINE HCL (PF) 0.5 % IJ SOLN
INTRAMUSCULAR | Status: AC
  Filled 2018-01-20: qty 30

## 2018-01-20 MED ORDER — IBUPROFEN 800 MG PO TABS: 800.0000 mg | ORAL_TABLET | Freq: Three times a day (TID) | ORAL | 1 refills | Status: DC | PRN

## 2018-01-20 MED ORDER — MIDAZOLAM HCL 2 MG/2ML IJ SOLN
INTRAMUSCULAR | Status: AC
Start: 2018-01-20 — End: ?
  Filled 2018-01-20: qty 2

## 2018-01-20 MED ORDER — LIDOCAINE HCL (CARDIAC) PF 100 MG/5ML IV SOSY
PREFILLED_SYRINGE | INTRAVENOUS | Status: DC | PRN
  Administered 2018-01-20: 100 mg via INTRAVENOUS

## 2018-01-20 MED ORDER — FAMOTIDINE 20 MG PO TABS
ORAL_TABLET | ORAL | Status: AC
  Filled 2018-01-20: qty 1

## 2018-01-20 MED ORDER — PHENYLEPHRINE HCL 10 MG/ML IJ SOLN
INTRAMUSCULAR | Status: DC | PRN
  Administered 2018-01-20: 100 ug via INTRAVENOUS
  Administered 2018-01-20: 50 ug via INTRAVENOUS

## 2018-01-20 MED ORDER — ONDANSETRON HCL 4 MG/2ML IJ SOLN
INTRAMUSCULAR | Status: AC
  Filled 2018-01-20: qty 2

## 2018-01-20 MED ORDER — ONDANSETRON HCL 4 MG/2ML IJ SOLN
INTRAMUSCULAR | Status: DC | PRN
  Administered 2018-01-20: 4 mg via INTRAVENOUS

## 2018-01-20 MED ORDER — OXYCODONE-ACETAMINOPHEN 5-325 MG PO TABS: 1.0000 | ORAL_TABLET | Freq: Four times a day (QID) | ORAL | 0 refills | Status: DC | PRN

## 2018-01-20 MED ORDER — KETOROLAC TROMETHAMINE 30 MG/ML IJ SOLN
INTRAMUSCULAR | Status: AC
  Filled 2018-01-20: qty 1

## 2018-01-20 MED ORDER — ROCURONIUM BROMIDE 50 MG/5ML IV SOLN
INTRAVENOUS | Status: AC
  Filled 2018-01-20: qty 1

## 2018-01-20 MED ORDER — ONDANSETRON HCL 4 MG/2ML IJ SOLN: 4.0000 mg | Freq: Once | INTRAMUSCULAR | Status: DC | PRN

## 2018-01-20 MED ORDER — OXYCODONE-ACETAMINOPHEN 5-325 MG PO TABS
1.0000 | ORAL_TABLET | Freq: Once | ORAL | Status: AC
  Administered 2018-01-20: 1 via ORAL

## 2018-01-20 MED ORDER — OXYCODONE-ACETAMINOPHEN 5-325 MG PO TABS
ORAL_TABLET | ORAL | Status: AC
  Administered 2018-01-20: 1 via ORAL
  Filled 2018-01-20: qty 1

## 2018-01-20 MED ORDER — FENTANYL CITRATE (PF) 100 MCG/2ML IJ SOLN
25.0000 ug | INTRAMUSCULAR | Status: DC | PRN
  Administered 2018-01-20 (×4): 25 ug via INTRAVENOUS

## 2018-01-20 MED ORDER — SUCCINYLCHOLINE CHLORIDE 20 MG/ML IJ SOLN
INTRAMUSCULAR | Status: DC | PRN
  Administered 2018-01-20: 100 mg via INTRAVENOUS

## 2018-01-20 MED ORDER — SUGAMMADEX SODIUM 500 MG/5ML IV SOLN
INTRAVENOUS | Status: AC
  Filled 2018-01-20: qty 5

## 2018-01-20 MED ORDER — FENTANYL CITRATE (PF) 100 MCG/2ML IJ SOLN
INTRAMUSCULAR | Status: DC | PRN
  Administered 2018-01-20 (×2): 50 ug via INTRAVENOUS

## 2018-01-20 MED ORDER — FENTANYL CITRATE (PF) 100 MCG/2ML IJ SOLN
INTRAMUSCULAR | Status: AC
  Administered 2018-01-20: 25 ug via INTRAVENOUS
  Filled 2018-01-20: qty 2

## 2018-01-20 MED ORDER — ROCURONIUM BROMIDE 100 MG/10ML IV SOLN
INTRAVENOUS | Status: DC | PRN
  Administered 2018-01-20: 50 mg via INTRAVENOUS
  Administered 2018-01-20: 10 mg via INTRAVENOUS
  Administered 2018-01-20: 5 mg via INTRAVENOUS

## 2018-01-20 MED ORDER — DEXAMETHASONE SODIUM PHOSPHATE 10 MG/ML IJ SOLN
INTRAMUSCULAR | Status: DC | PRN
  Administered 2018-01-20: 10 mg via INTRAVENOUS

## 2018-01-20 MED ORDER — PROPOFOL 10 MG/ML IV BOLUS
INTRAVENOUS | Status: DC | PRN
  Administered 2018-01-20: 200 mg via INTRAVENOUS

## 2018-01-20 MED ORDER — SUGAMMADEX SODIUM 500 MG/5ML IV SOLN
INTRAVENOUS | Status: DC | PRN
  Administered 2018-01-20: 224 mg via INTRAVENOUS

## 2018-01-20 MED ORDER — SIMETHICONE 80 MG PO CHEW: 80.0000 mg | CHEWABLE_TABLET | Freq: Four times a day (QID) | ORAL | 2 refills | Status: DC | PRN

## 2018-01-20 MED ORDER — MIDAZOLAM HCL 2 MG/2ML IJ SOLN
INTRAMUSCULAR | Status: DC | PRN
  Administered 2018-01-20: 2 mg via INTRAVENOUS

## 2018-01-20 SURGICAL SUPPLY — 38 items
APPLICATOR ARISTA FLEXITIP XL (MISCELLANEOUS) ×2 IMPLANT
BLADE SURG SZ11 CARB STEEL (BLADE) ×2 IMPLANT
CANISTER SUCT 1200ML W/VALVE (MISCELLANEOUS) ×2 IMPLANT
CATH ROBINSON RED A/P 16FR (CATHETERS) ×2 IMPLANT
CHLORAPREP W/TINT 26ML (MISCELLANEOUS) ×2 IMPLANT
CORD MONOPOLAR M/FML 12FT (MISCELLANEOUS) IMPLANT
DERMABOND ADVANCED (GAUZE/BANDAGES/DRESSINGS) ×1
DERMABOND ADVANCED .7 DNX12 (GAUZE/BANDAGES/DRESSINGS) ×1 IMPLANT
GLOVE BIO SURGEON STRL SZ 6.5 (GLOVE) ×4 IMPLANT
GLOVE BIO SURGEON STRL SZ8 (GLOVE) ×2 IMPLANT
GLOVE INDICATOR 7.0 STRL GRN (GLOVE) ×4 IMPLANT
GLOVE INDICATOR 8.0 STRL GRN (GLOVE) ×2 IMPLANT
GOWN STRL REUS W/ TWL LRG LVL3 (GOWN DISPOSABLE) ×2 IMPLANT
GOWN STRL REUS W/TWL LRG LVL3 (GOWN DISPOSABLE) ×2
GOWN STRL REUS W/TWL XL LVL4 (GOWN DISPOSABLE) ×2 IMPLANT
HEMOSTAT ARISTA ABSORB 1G (MISCELLANEOUS) ×2 IMPLANT
IRRIGATION STRYKERFLOW (MISCELLANEOUS) ×1 IMPLANT
IRRIGATOR STRYKERFLOW (MISCELLANEOUS) ×2
IV LACTATED RINGERS 1000ML (IV SOLUTION) ×2 IMPLANT
KIT PINK PAD W/HEAD ARE REST (MISCELLANEOUS) ×2
KIT PINK PAD W/HEAD ARM REST (MISCELLANEOUS) ×1 IMPLANT
KIT TURNOVER CYSTO (KITS) ×2 IMPLANT
NS IRRIG 500ML POUR BTL (IV SOLUTION) ×2 IMPLANT
PACK GYN LAPAROSCOPIC (MISCELLANEOUS) ×2 IMPLANT
PAD OB MATERNITY 4.3X12.25 (PERSONAL CARE ITEMS) ×2 IMPLANT
PAD PREP 24X41 OB/GYN DISP (PERSONAL CARE ITEMS) ×2 IMPLANT
POUCH ENDO CATCH 10MM SPEC (MISCELLANEOUS) IMPLANT
SCISSORS METZENBAUM CVD 33 (INSTRUMENTS) IMPLANT
SHEARS HARMONIC ACE PLUS 36CM (ENDOMECHANICALS) IMPLANT
SLEEVE ENDOPATH XCEL 5M (ENDOMECHANICALS) ×2 IMPLANT
SUT VIC AB 3-0 SH 27 (SUTURE)
SUT VIC AB 3-0 SH 27X BRD (SUTURE) IMPLANT
SUT VICRYL 0 AB UR-6 (SUTURE) ×2 IMPLANT
TROCAR 5M 150ML BLDLS (TROCAR) ×2 IMPLANT
TROCAR ENDO BLADELESS 11MM (ENDOMECHANICALS) ×2 IMPLANT
TROCAR XCEL NON-BLD 5MMX100MML (ENDOMECHANICALS) ×2 IMPLANT
TROCAR XCEL UNIV SLVE 11M 100M (ENDOMECHANICALS) ×2 IMPLANT
TUBING INSUFFLATION (TUBING) ×2 IMPLANT

## 2018-01-20 NOTE — Anesthesia Post-op Follow-up Note (Signed)
Anesthesia QCDR form completed.        

## 2018-01-20 NOTE — Discharge Instructions (Addendum)
General Gynecological Post-Operative Instructions °You may expect to feel dizzy, weak, and drowsy for as long as 24 hours after receiving the medicine that made you sleep (anesthetic).  °Do not drive a car, ride a bicycle, participate in physical activities, or take public transportation until you are done taking narcotic pain medicines or as directed by your doctor.  °Do not drink alcohol or take tranquilizers.  °Do not take medicine that has not been prescribed by your doctor.  °Do not sign important papers or make important decisions while on narcotic pain medicines.  °Have a responsible person with you.  °CARE OF INCISION  °Keep incision clean and dry. °Take showers instead of baths until your doctor gives you permission to take baths.  °Avoid heavy lifting (more than 10 pounds/4.5 kilograms), pushing, or pulling.  °Avoid activities that may risk injury to your surgical site.  °No sexual intercourse or placement of anything in the vagina for 2 weeks or as instructed by your doctor. °If you have tubes coming from the wound site, check with your doctor regarding appropriate care of the tubes. °Only take prescription or over-the-counter medicines  for pain, discomfort, or fever as directed by your doctor. Do not take aspirin. It can make you bleed. Take medicines (antibiotics) that kill germs if they are prescribed for you.  °Call the office or go to the Emergency Room if:  °You feel sick to your stomach (nauseous).  °You start to throw up (vomit).  °You have trouble eating or drinking.  °You have an oral temperature above 101.  °You have constipation that is not helped by adjusting diet or increasing fluid intake. Pain medicines are a common cause of constipation.  °You have any other concerns. °SEEK IMMEDIATE MEDICAL CARE IF:  °You have persistent dizziness.  °You have difficulty breathing or a congested sounding (croupy) cough.  °You have an oral temperature above 102.5, not controlled by medicine.  °There is  increasing pain or tenderness near or in the surgical site.  ° ° °AMBULATORY SURGERY  °DISCHARGE INSTRUCTIONS ° ° °1) The drugs that you were given will stay in your system until tomorrow so for the next 24 hours you should not: ° °A) Drive an automobile °B) Make any legal decisions °C) Drink any alcoholic beverage ° ° °2) You may resume regular meals tomorrow.  Today it is better to start with liquids and gradually work up to solid foods. ° °You may eat anything you prefer, but it is better to start with liquids, then soup and crackers, and gradually work up to solid foods. ° ° °3) Please notify your doctor immediately if you have any unusual bleeding, trouble breathing, redness and pain at the surgery site, drainage, fever, or pain not relieved by medication. ° ° ° °4) Additional Instructions: ° ° ° ° ° ° ° °Please contact your physician with any problems or Same Day Surgery at [phone], Monday through Friday 6 am to 4 pm, or [facility] Main number at [phone]. °

## 2018-01-20 NOTE — Transfer of Care (Signed)
Immediate Anesthesia Transfer of Care Note  Patient: Emily [name]  Procedure(s) Performed: LAPAROSCOPIC BILATERAL OOPHORECTOMY (Bilateral )  Patient Location: PACU  Anesthesia Type:General  Level of Consciousness: awake, alert  and oriented  Airway & Oxygen Therapy: Patient Spontanous Breathing and Patient connected to face mask oxygen  Post-op Assessment: Report given to RN and Post -op Vital signs reviewed and stable  Post vital signs: Reviewed and stable  Last Vitals:  Vitals Value Taken Time  BP    Temp    Pulse 86 01/20/2018 11:30 AM  Resp 19 01/20/2018 11:30 AM  SpO2 100 % 01/20/2018 11:30 AM  Vitals shown include unvalidated device data.  Last Pain:  Vitals:   01/20/18 0706  TempSrc: Tympanic  PainSc: 0-No pain         Complications: No apparent anesthesia complications

## 2018-01-20 NOTE — Anesthesia Preprocedure Evaluation (Signed)
Anesthesia Evaluation  Patient identified by MRN, date of birth, ID band Patient awake    Reviewed: Allergy & Precautions, H&P , NPO status , Patient's Chart, lab work & pertinent test results, reviewed documented beta blocker date and time   Airway Mallampati: III  TM Distance: >3 FB Neck ROM: full    Dental  (+) Teeth Intact   Pulmonary neg pulmonary ROS, asthma , sleep apnea and Continuous Positive Airway Pressure Ventilation , former smoker,    Pulmonary exam normal        Cardiovascular Exercise Tolerance: Good negative cardio ROS Normal cardiovascular exam Rhythm:regular Rate:Normal     Neuro/Psych  Headaches, PSYCHIATRIC DISORDERS Depression Bipolar Disorder  Neuromuscular disease negative neurological ROS  negative psych ROS   GI/Hepatic negative GI ROS, Neg liver ROS,   Endo/Other  negative endocrine ROS  Renal/GU negative Renal ROS  negative genitourinary   Musculoskeletal   Abdominal   Peds  Hematology negative hematology ROS (+) anemia ,   Anesthesia Other Findings Past Medical History: No date: Anemia No date: Asthma     Comment:  sports induced-no inhalers No date: Bipolar 1 disorder (HCC) No date: Conversion disorder No date: Depression No date: Insomnia No date: Migraines 2010: MRSA infection No date: Ovarian cyst     Comment:  left 2.5cm No date: Sleep apnea No date: Vitamin B 12 deficiency No date: Vitamin D deficiency Past Surgical History: No date: ABDOMINAL HYSTERECTOMY No date: BUNIONECTOMY No date: HAMMER TOE SURGERY 2014: HAND SURGERY     Comment:  cyst removed 05/09/2015: INTRAUTERINE DEVICE (IUD) INSERTION; N/A     Comment:  Procedure: INTRAUTERINE DEVICE (IUD) INSERTION;                Surgeon: Hildred Laser, MD;  Location: [facility];  Service:              Gynecology;  Laterality: N/A; 06/20/2015: LAPAROSCOPIC ASSISTED VAGINAL HYSTERECTOMY; N/A     Comment:  Procedure:  LAPAROSCOPIC ASSISTED VAGINAL HYSTERECTOMY,               BILATERAL SALPINGECTOMY, CYSTOSCOPY, IUD REMOVAL ;                Surgeon: Hildred Laser, MD;  Location: [facility];  Service:              Gynecology;  Laterality: N/A; 05/09/2015: LAPAROSCOPIC TUBAL LIGATION; Bilateral     Comment:  Procedure: LAPAROSCOPIC TUBAL LIGATION;  Surgeon: Hildred Laser, MD;  Location: [facility];  Service: Gynecology;                Laterality: Bilateral; No date: NASAL SINUS SURGERY 2007: TUBAL LIGATION BMI    Body Mass Index:  36.48 kg/m     Reproductive/Obstetrics negative OB ROS                             Anesthesia Physical Anesthesia Plan  ASA: III  Anesthesia Plan: General ETT   Post-op Pain Management:    Induction:   PONV Risk Score and Plan:   Airway Management Planned:   Additional Equipment:   Intra-op Plan:   Post-operative Plan:   Informed Consent: I have reviewed the patients History and Physical, chart, labs and discussed the procedure including the risks, benefits and alternatives for the proposed anesthesia with the patient or authorized representative who has indicated  his/her understanding and acceptance.   Dental Advisory Given  Plan Discussed with: CRNA  Anesthesia Plan Comments:         Anesthesia Quick Evaluation

## 2018-01-20 NOTE — Anesthesia Procedure Notes (Signed)
Procedure Name: Intubation Performed by: Demetrius Charity, CRNA Pre-anesthesia Checklist: Patient identified, Patient being monitored, Timeout performed, Emergency Drugs available and Suction available Patient Re-evaluated:Patient Re-evaluated prior to induction Oxygen Delivery Method: Circle system utilized Preoxygenation: Pre-oxygenation with 100% oxygen Induction Type: IV induction Ventilation: Mask ventilation without difficulty and Oral airway inserted - appropriate to patient size Laryngoscope Size: Mac and 3 Grade View: Grade II Tube type: Oral Tube size: 7.0 mm Number of attempts: 1 Airway Equipment and Method: Stylet Placement Confirmation: ETT inserted through vocal cords under direct vision,  positive ETCO2 and breath sounds checked- equal and bilateral Secured at: 21 cm Tube secured with: Tape Dental Injury: Teeth and Oropharynx as per pre-operative assessment

## 2018-01-20 NOTE — H&P (Signed)
UPDATE TO PREVIOUS HISTORY AND PHYSICAL  The patient has been seen and examined.  H&P is up to date, no changes noted. Emily [name] is a 37 y.o. [phone] here for surgical management of chronic pelvic pain with laparoscopic bilateral oophorectomy. She has a past surgical history of hysterectomy for benign reasons. No significant preoperative concerns except for moderate obesity.  Patient understands risk of early removal of ovaries and surgical menopause, but still desires procedure. All questions answered.  Patient can proceed to the OR for scheduled procedure.   Hildred Laser, MD 01/20/2018 7:25 AM

## 2018-01-20 NOTE — Op Note (Signed)
Procedure(s): LAPAROSCOPIC BILATERAL OOPHORECTOMY/ADHESIOLYSIS Procedure Note  [name] female 37 y.o. 01/20/2018  Indications: The patient is a 37 y.o. [id number] female with h/o hysterectomy and bilateral salpingectomy, chronic pelvic pain, morbid obesity.  Pre-operative Diagnosis: prior history of hysterectomyand bilateral salpingectomy, chronic pelvic pain, morbid obesity  Post-operative Diagnosis:  Same with dense pelvic adhesions  Surgeon: Hildred Laser, MD  Assistants: Brennan Bailey, CNM  Anesthesia: General endotracheal anesthesia  Procedure Details: The patient was seen in the Holding Room. The risks, benefits, complications, treatment options, and expected outcomes were discussed with the patient.  The patient concurred with the proposed plan, giving informed consent.  The site of surgery properly noted. The patient was taken to the Operating Room, identified as Emily [name] and the procedure verified as Procedure(s) (LRB): LAPAROSCOPIC BILATERAL OOPHORECTOMY (Bilateral).   She was then placed under general anesthesia without difficulty. She was placed in the dorsal lithotomy position, and was prepped and draped in a sterile manner.  A straight catheterization was performed. A sponge stick was placed into the vagina.  The speculum and tenaculum were then removed. After an adequate timeout was performed, attention was turned to the abdomen where an umbilical incision was made with the scalpel.  The Optiview 5-mm trocar and sleeve were then advanced without difficulty with the laparoscope under direct visualization into the abdomen.  The abdomen was then insufflated with carbon dioxide gas and adequate pneumoperitoneum was obtained. A 5-mm left lower quadrant port and an 11-mm right lower quadrant port were then placed under direct visualization.  A survey of the patient's pelvis and abdomen revealed the findings as above.  On the right side, the infundibulopelvic ligament was clamped  and transected with the Harmonic device. This procedure was then repeated on the left side.  An Endocatch bag was then inserted into the 11 mm trochar and both ovaries and tubes were removed.  The trocar was then reinserted. A survey of the surgical sites were noted to have good hemostasis.   Attention was then turned to the pelvis where dense adhesions of the large bowel and omentum were noted to the vaginal cuff and left pelvic sidewall.  Due to the location of the adhesions, a 5-mm suprapubic port was placed to allow better access to the adhesions in light of her adiposity.  The adhesions were lysed using the Harmonic device and with blunt dissection along the left pelvic sidewall.  Blunt dissection was then used to separate the adhesions of the bowel to the vaginal cuff.  Bleeding occurred with performance of adhesiolysis, and hemostasis was achieved with the Harmonic scalpel.  There was still a small segment  of bowel that was still densely attached to the vaginal cuff, however the decision was made to abandon adhesiolysis due to concerns for possible bowel injury if pursued.    A final survey was performed, where there was slight oozing noted from the omentum at the site of adhesiolysis.  This was treated with the Harmonic scalpel and Arista was poured over the area.  Good hemostasis was then achieved.  All trocars were removed under direct visualization, and the abdomen which was desufflated.    The fascia of the 11 mm incision was closed using a 0-Vicryl.  All skin incisions were closed with 4-0 Monocryl subcuticular stitches. Dermabond was placed over each incision. The patient tolerated the procedures well.  All instruments, needles, and sponge counts were correct x 2. The patient was taken to the recovery room awake, extubated and  in stable condition.   Findings: The uterus and cervix were surgically absent.  Fallopian tubes were surgically absent. Filshie clip present on distal end of of round  ligament near ovary, removed during current surgery.  Ovaries appeared normal, except few filmy adhesions to sidewalls bilaterally.  Dense adhesion of bowel and omentum to left pelvic sidewall and vaginal cuff.    Estimated Blood Loss:  75 ml      Drains: straight catheterization prior to procedure with  100 ml of clear urine         Total IV Fluids:  800 ml  Specimens: Bilateral          Implants: None         Complications:  None; patient tolerated the procedure well.         Disposition: PACU - hemodynamically stable.         Condition: stable   Hildred Laser, MD [facility]

## 2018-01-21 LAB — SURGICAL PATHOLOGY

## 2018-01-22 NOTE — Anesthesia Postprocedure Evaluation (Signed)
Anesthesia Post Note  Patient: ARIELLA VOIT  Procedure(s) Performed: LAPAROSCOPIC BILATERAL OOPHORECTOMY (Bilateral )  Patient location during evaluation: PACU Anesthesia Type: General Level of consciousness: awake and alert Pain management: pain level controlled Vital Signs Assessment: post-procedure vital signs reviewed and stable Respiratory status: spontaneous breathing, nonlabored ventilation, respiratory function stable and patient connected to nasal cannula oxygen Cardiovascular status: blood pressure returned to baseline and stable Postop Assessment: no apparent nausea or vomiting Anesthetic complications: no     Last Vitals:  Vitals:   01/20/18 1238 01/20/18 1344  BP: 104/73 104/67  Pulse: 80 68  Resp: 16 18  Temp: (!) 36.3 C   SpO2: 98% 97%    Last Pain:  Vitals:   01/20/18 1344  TempSrc: Temporal  PainSc: 2                  Yevette Edwards

## 2018-01-24 ENCOUNTER — Encounter: Payer: Self-pay | Admitting: Obstetrics and Gynecology

## 2018-01-28 ENCOUNTER — Encounter: Payer: Self-pay | Admitting: Family Medicine

## 2018-01-28 ENCOUNTER — Ambulatory Visit (INDEPENDENT_AMBULATORY_CARE_PROVIDER_SITE_OTHER): Payer: Commercial Managed Care - PPO | Admitting: Family Medicine

## 2018-01-28 DIAGNOSIS — G4730 Sleep apnea, unspecified: Secondary | ICD-10-CM | POA: Diagnosis not present

## 2018-01-28 DIAGNOSIS — F339 Major depressive disorder, recurrent, unspecified: Secondary | ICD-10-CM

## 2018-01-28 DIAGNOSIS — F446 Conversion disorder with sensory symptom or deficit: Secondary | ICD-10-CM

## 2018-01-28 DIAGNOSIS — F3170 Bipolar disorder, currently in remission, most recent episode unspecified: Secondary | ICD-10-CM | POA: Diagnosis not present

## 2018-01-28 DIAGNOSIS — F449 Dissociative and conversion disorder, unspecified: Secondary | ICD-10-CM

## 2018-01-28 DIAGNOSIS — J4521 Mild intermittent asthma with (acute) exacerbation: Secondary | ICD-10-CM | POA: Diagnosis not present

## 2018-01-28 NOTE — Assessment & Plan Note (Signed)
Stable with no change 

## 2018-01-28 NOTE — Assessment & Plan Note (Signed)
The current medical regimen is effective;  continue present plan and medications.  

## 2018-01-28 NOTE — Assessment & Plan Note (Signed)
Using CPAP because it makes her sick.

## 2018-01-28 NOTE — Progress Notes (Signed)
BP 124/86   Pulse 70   Ht  (1.753 m)   Wt 247 lb (112 kg)   LMP 06/01/2015 (Within Days)   SpO2 99%   BMI 36.48 kg/m    Subjective:    Patient ID: Emily [name], female    DOB: [birth year], 37 y.o.   MRN: [id number]  HPI: AIMAR SHREWSBURY is a 37 y.o. female  Chief Complaint  Patient presents with  . Medication Management  Patient follow-up had oophorectomy and is already doing better pain wise.  Postoperative wise also doing well.  Is only been 1 week still having some limitations but all in all doing much better. Stress fracture from over running last month is doing okay.  Having some limitations getting her walking boot on. Taking bupropion doing well with that no side effects or issues. Albuterol use for rescue appropriately and without problems.  Helps a great deal when she takes it.  Relevant past medical, surgical, family and social history reviewed and updated as indicated. Interim medical history since our last visit reviewed. Allergies and medications reviewed and updated.  Review of Systems  Constitutional: Negative.   Respiratory: Negative.   Cardiovascular: Negative.     Per HPI unless specifically indicated above     Objective:    BP 124/86   Pulse 70   Ht  (1.753 m)   Wt 247 lb (112 kg)   LMP 06/01/2015 (Within Days)   SpO2 99%   BMI 36.48 kg/m   Wt Readings from Last 3 Encounters:  01/28/18 247 lb (112 kg)  01/20/18 247 lb (112 kg)  01/06/18 253 lb (114.8 kg)    Physical Exam  Constitutional: She is oriented to person, place, and time. She appears well-developed and well-nourished.  HENT:  Head: Normocephalic and atraumatic.  Eyes: Conjunctivae and EOM are normal.  Neck: Normal range of motion.  Cardiovascular: Normal rate, regular rhythm and normal heart sounds.  Pulmonary/Chest: Effort normal and breath sounds normal.  Musculoskeletal: Normal range of motion.  Neurological: She is alert and oriented to person, place, and time.    Skin: No erythema.  Psychiatric: She has a normal mood and affect. Her behavior is normal. Judgment and thought content normal.    Results for orders placed or performed during the hospital encounter of 01/20/18  CBC  Result Value Ref Range   WBC 9.7 3.6 - 11.0 K/uL   RBC 4.54 3.80 - 5.20 MIL/uL   Hemoglobin 14.0 12.0 - 16.0 g/dL   HCT 65.7 84.6 - 96.2 %   MCV 89.1 80.0 - 100.0 fL   MCH 30.8 26.0 - 34.0 pg   MCHC 34.6 32.0 - 36.0 g/dL   RDW 95.2 84.1 - 32.4 %   Platelets 271 150 - 440 K/uL  Type and screen [facility]  Result Value Ref Range   ABO/RH(D) O POS    Antibody Screen NEG    Sample Expiration      01/23/2018 Performed at [facility], [address] [place], [place] [address]   Surgical pathology  Result Value Ref Range   SURGICAL PATHOLOGY      Surgical Pathology CASE: [id number] PATIENT: See [facility] Report     SPECIMEN SUBMITTED: A. Bilateral ovaries  CLINICAL HISTORY: None provided  PRE-OPERATIVE DIAGNOSIS: Pelvic pain, H/O LAVH, moderate obesity  POST-OPERATIVE DIAGNOSIS: Pelvic pain, H/O LAVH     DIAGNOSIS: A.  BILATERAL OVARIES; LAPAROSCOPIC BILATERAL OOPHORECTOMY: - HEMORRHAGIC CORPUS LUTEUM. -  NEGATIVE FOR ATYPIA AND MALIGNANCY.   GROSS DESCRIPTION: A. Labeled: Bilateral ovaries Received: In formalin Tissue fragment(s): 2 Size: 6 g, 3.2 x 2.0 x 1.5 cm ovary and 14 g focally disrupted 4.1 x 3.2 x 1.5 cm ovary with some attached fibrous tissue and ligating clip in the attached fibrous tissue Description: Both ovaries are lobulated pink-tan. The first described is inked blue and sectioned to reveal a heterogeneous pink-orange cut surface with multiple unilocular cysts up to 0.4 cm.  The second ovary is sectioned to reveal a heterogeneous pink-tan cut surface wit h a 2 cm in greatest dimension serpiginous orange-red area. Entirely submitted in Block summary 1-2 - representative  first ovary 3-4 - representative second ovary.   Final Diagnosis performed by Glenice Bow, MD.   Electronically signed 01/21/2018 3:51:58PM The electronic signature indicates that the named Attending Pathologist has evaluated the specimen  Technical component performed at [facility], [address] [place], [place] [address] Lab: [phone] Dir: Jolene Schimke, MD, MMM  Professional component performed at [facility], [address] [place], [place], [place] [address] Lab: [phone] Dir: Georgiann Cocker. Oneita Kras, MD       Assessment & Plan:   Problem List Items Addressed This Visit      Respiratory   Asthma    The current medical regimen is effective;  continue present plan and medications.        Sleep apnea    Using CPAP because it makes her sick.        Other   Conversion disorder   Bipolar disorder (HCC)   Recurrent depression (HCC)    The current medical regimen is effective;  continue present plan and medications.       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

## 2018-01-29 ENCOUNTER — Ambulatory Visit (INDEPENDENT_AMBULATORY_CARE_PROVIDER_SITE_OTHER): Payer: Commercial Managed Care - PPO | Admitting: Obstetrics and Gynecology

## 2018-01-29 ENCOUNTER — Encounter: Payer: Self-pay | Admitting: Obstetrics and Gynecology

## 2018-01-29 VITALS — BP 106/78 | HR 82 | Ht 69.0 in | Wt 246.9 lb

## 2018-01-29 DIAGNOSIS — Z9889 Other specified postprocedural states: Secondary | ICD-10-CM

## 2018-01-29 DIAGNOSIS — Z90722 Acquired absence of ovaries, bilateral: Secondary | ICD-10-CM

## 2018-01-29 DIAGNOSIS — E8941 Symptomatic postprocedural ovarian failure: Secondary | ICD-10-CM

## 2018-01-29 NOTE — Patient Instructions (Signed)
Take Premarin tablets once a day.

## 2018-01-29 NOTE — Progress Notes (Signed)
Pt states that both of her incisions feel like they are coming a loose and is oozing yellow looking discharge then turned more red to bright red that looks like blood. Pt noticed this about 2 days after the surgery. Pt stated that the discharge did not have an odor like an infection but smelled like iron.

## 2018-01-29 NOTE — Progress Notes (Signed)
    OBSTETRICS/GYNECOLOGY POST-OPERATIVE CLINIC VISIT  Subjective:     Emily [name] is a 37 y.o. female who presents to the clinic 1 weeks status post bilateral oophorectomy and adhesiolysis for pelvic pain and pelvic adhesions. Eating a regular diet without difficulty. Bowel movements are normal. Pain is controlled with current analgesics. Medications being used: prescription NSAID's including ibuprofen (Motrin) and narcotic analgesics including oxycodone/acetaminophen (Percocet, Tylox).  Patient complains of leaking from her right-sided incision, started ~ 2 days after the surgery, began as yellowish discharge but then turned bloody.  Did not notice any odor.    Of note, patient notes that she has begun to experience menopausal symptoms since her surgery, has been very emotional, also with hot flushes. However she also notes that she is happy in that since her surgery she has not experienced any further pelvic pain except for the incisional pain.   The following portions of the patient's history were reviewed and updated as appropriate: allergies, current medications, past family history, past medical history, past social history, past surgical history and problem list.  Review of Systems Pertinent items noted in HPI and remainder of comprehensive ROS otherwise negative.    Objective:    BP 106/78   Pulse 82   Ht  (1.753 m)   Wt 246 lb 14.4 oz (112 kg)   LMP 06/01/2015 (Within Days)   BMI 36.46 kg/m  General:  alert and no distress  Abdomen: soft, bowel sounds active, non-tender  Incisions:   healing well, no drainage, no erythema, no hernia, no seroma, no swelling, no dehiscence, incision well approximated.  Dermabond covering all incisions.  Attempted to express drainage from right incision but none produced.     Pathology:  A. BILATERAL OVARIES; LAPAROSCOPIC BILATERAL OOPHORECTOMY:  - HEMORRHAGIC CORPUS LUTEUM.  - NEGATIVE FOR ATYPIA AND MALIGNANCY.   Assessment:    Doing well postoperatively.  S/p laparoscopic bilateral oophorectomy Surgical menopause, symptomatic   Plan:   1. Continue any current medications.  Has begun weaning from narcotics.  2. Wound care discussed. 3. Operative findings again reviewed. Pathology report discussed. 4. Activity restrictions: no bending, stooping, or squatting, no lifting more than 10-15 pounds and pelvic rest for an additional week 5. Anticipated return to work: 1 week.  6. Discussed options for management for surgical menopausal symptoms, including HRT, non-hormonal therapy. Patient ok to begin HRT. Will prescribe Premarin 0.625 mg daily.  7. Follow up: 2 weeks for reassessment of menopausal symptoms.     Hildred Laser, MD [facility]

## 2018-02-12 ENCOUNTER — Ambulatory Visit (INDEPENDENT_AMBULATORY_CARE_PROVIDER_SITE_OTHER): Payer: Commercial Managed Care - PPO | Admitting: Obstetrics and Gynecology

## 2018-02-12 ENCOUNTER — Encounter: Payer: Self-pay | Admitting: Obstetrics and Gynecology

## 2018-02-12 VITALS — BP 103/70 | HR 76 | Ht 69.0 in | Wt 239.3 lb

## 2018-02-12 DIAGNOSIS — E8941 Symptomatic postprocedural ovarian failure: Secondary | ICD-10-CM

## 2018-02-12 DIAGNOSIS — Z8669 Personal history of other diseases of the nervous system and sense organs: Secondary | ICD-10-CM | POA: Diagnosis not present

## 2018-02-12 MED ORDER — PAROXETINE MESYLATE 7.5 MG PO CAPS: 1.0000 | ORAL_CAPSULE | Freq: Every day | ORAL | 11 refills | Status: DC

## 2018-02-12 NOTE — Progress Notes (Signed)
Pt stated that she was having really bad headaches from the sample medication that was given to her on her last visit pt stopped taking them after 7 days. Pt stated that she is having night sweats, hot flashes, and problems sleeping.

## 2018-02-12 NOTE — Progress Notes (Signed)
    GYNECOLOGY PROGRESS NOTE  Subjective:    Patient ID: Emily [name], female    DOB: October 16, 1980, 37 y.o.   MRN: [id number]  HPI  Patient is a 37 y.o. G33P3003 female who presents for 2 week f/u after initiation of HRT for surgical menopausal symptoms.  Patient notes that she had to stop taking the medication after 7 days due to severe persistent headaches. After stopping the medication she now has resumed having hot flushes, night sweats, and problems sleeping.  Of note, patient does have a h/o migraines.   The following portions of the patient's history were reviewed and updated as appropriate: allergies, current medications, past family history, past medical history, past social history, past surgical history and problem list.  Review of Systems Pertinent items noted in HPI and remainder of comprehensive ROS otherwise negative.   Objective:   Blood pressure 103/70, pulse 76, height  (1.753 m), weight 239 lb 4.8 oz (108.5 kg), last menstrual period 06/01/2015. General appearance: alert and no distress Abdomen: soft, non-tender; bowel sounds normal; no masses,  no organomegaly.  Incision site on right healing well. All other incisions well healed.  Pelvic: deferred   Assessment:   Surgical menopausal symptoms H/o migraines  Plan:   - Will change from HRT (patches) due to migraines.  Initiated [place]. Patient to notify MD in 2 weeks via Mychart on status of medication.  - Since patient will not be using hormones, advised on increasing supplementation of Vitamin D and calcium for osteoporosis prevention. Patient already has a h/o Vitamin D deficiency and is taking some supplementation. Also encouraged on taking a daily multi-vitamin.  - Return if symptoms worsen or fail to improve.    Hildred Laser, MD [facility]

## 2018-07-09 ENCOUNTER — Other Ambulatory Visit: Payer: Self-pay

## 2018-07-09 NOTE — Telephone Encounter (Signed)
Pharmacy called stating that they were out of the Paroxetine 7.5mg  and wanted to give the pt Paroxetine 10 mg. AC was asked and stated that it was okay to give the pt Paroxetine 10 mg.

## 2018-07-31 ENCOUNTER — Ambulatory Visit (INDEPENDENT_AMBULATORY_CARE_PROVIDER_SITE_OTHER): Payer: Commercial Managed Care - PPO | Admitting: Family Medicine

## 2018-07-31 ENCOUNTER — Encounter: Payer: Self-pay | Admitting: Family Medicine

## 2018-07-31 VITALS — BP 137/88 | HR 68 | Temp 97.9°F | Ht 67.72 in | Wt 246.1 lb

## 2018-07-31 DIAGNOSIS — F339 Major depressive disorder, recurrent, unspecified: Secondary | ICD-10-CM

## 2018-07-31 DIAGNOSIS — Z23 Encounter for immunization: Secondary | ICD-10-CM

## 2018-07-31 DIAGNOSIS — Z0000 Encounter for general adult medical examination without abnormal findings: Secondary | ICD-10-CM | POA: Diagnosis not present

## 2018-07-31 DIAGNOSIS — F449 Dissociative and conversion disorder, unspecified: Secondary | ICD-10-CM | POA: Diagnosis not present

## 2018-07-31 DIAGNOSIS — G43909 Migraine, unspecified, not intractable, without status migrainosus: Secondary | ICD-10-CM | POA: Diagnosis not present

## 2018-07-31 DIAGNOSIS — F3170 Bipolar disorder, currently in remission, most recent episode unspecified: Secondary | ICD-10-CM | POA: Diagnosis not present

## 2018-07-31 DIAGNOSIS — R110 Nausea: Secondary | ICD-10-CM | POA: Insufficient documentation

## 2018-07-31 LAB — MICROSCOPIC EXAMINATION

## 2018-07-31 LAB — URINALYSIS, ROUTINE W REFLEX MICROSCOPIC
BILIRUBIN UA: NEGATIVE
Glucose, UA: NEGATIVE
KETONES UA: NEGATIVE
LEUKOCYTES UA: NEGATIVE
NITRITE UA: NEGATIVE
Protein, UA: NEGATIVE
SPEC GRAV UA: 1.01 (ref 1.005–1.030)
Urobilinogen, Ur: 0.2 mg/dL (ref 0.2–1.0)
pH, UA: 5.5 (ref 5.0–7.5)

## 2018-07-31 MED ORDER — BUPROPION HCL ER (XL) 300 MG PO TB24: 300.0000 mg | ORAL_TABLET | Freq: Every day | ORAL | 12 refills | Status: DC

## 2018-07-31 NOTE — Assessment & Plan Note (Signed)
Because of ongoing nausea and some food intolerances will refer to allergy to further evaluate food allergies.

## 2018-07-31 NOTE — Assessment & Plan Note (Signed)
Stable

## 2018-07-31 NOTE — Progress Notes (Signed)
BP 137/88 (BP Location: Left Arm, Patient Position: Sitting, Cuff Size: Large)   Pulse 68   Temp 97.9 F (36.6 C)   Ht 5' 7.72" (1.72 m)   Wt 246 lb 2 oz (111.6 kg)   LMP 06/01/2015 (Within Days)   SpO2 99%   BMI 37.74 kg/m    Subjective:    Patient ID: Emily [name], female    DOB: 1981/09/07, 37 y.o.   MRN: [id number]  HPI: Emily [name] is a 37 y.o. female  Chief Complaint  Patient presents with  . Annual Exam   Patient with concern about intermittent nausea seems to be worse with lactose products as gone with some lactose-free and seems to help better but is concerned about other ongoing food allergies intolerances.  Patient does have symptoms of nausea and wondering if food allergies could be a cause. Nerve issues otherwise stable continues to have right arm numbness. No issues with migraine headaches and no issues with asthma. Not using CPAP machine but has not had issues with sleep apnea.  Relevant past medical, surgical, family and social history reviewed and updated as indicated. Interim medical history since our last visit reviewed. Allergies and medications reviewed and updated.  Review of Systems  Constitutional: Negative.   HENT: Negative.   Eyes: Negative.   Respiratory: Negative.   Cardiovascular: Negative.   Gastrointestinal: Negative.   Endocrine: Negative.   Genitourinary: Negative.   Musculoskeletal: Negative.   Skin: Negative.   Allergic/Immunologic: Negative.   Neurological: Negative.   Hematological: Negative.   Psychiatric/Behavioral: Negative.     Per HPI unless specifically indicated above     Objective:    BP 137/88 (BP Location: Left Arm, Patient Position: Sitting, Cuff Size: Large)   Pulse 68   Temp 97.9 F (36.6 C)   Ht 5' 7.72" (1.72 m)   Wt 246 lb 2 oz (111.6 kg)   LMP 06/01/2015 (Within Days)   SpO2 99%   BMI 37.74 kg/m   Wt Readings from Last 3 Encounters:  07/31/18 246 lb 2 oz (111.6 kg)  02/12/18 239 lb 4.8 oz  (108.5 kg)  01/29/18 246 lb 14.4 oz (112 kg)    Physical Exam  Constitutional: She is oriented to person, place, and time. She appears well-developed and well-nourished.  HENT:  Head: Normocephalic and atraumatic.  Right Ear: External ear normal.  Left Ear: External ear normal.  Nose: Nose normal.  Mouth/Throat: Oropharynx is clear and moist.  Eyes: Pupils are equal, round, and reactive to light. Conjunctivae and EOM are normal.  Neck: Normal range of motion. Neck supple. Carotid bruit is not present.  Cardiovascular: Normal rate, regular rhythm and normal heart sounds.  No murmur heard. Pulmonary/Chest: Effort normal and breath sounds normal. She exhibits no mass. Right breast exhibits no mass, no skin change and no tenderness. Left breast exhibits no mass, no skin change and no tenderness. Breasts are symmetrical.  Abdominal: Soft. Bowel sounds are normal. There is no hepatosplenomegaly.  Musculoskeletal: Normal range of motion.  Neurological: She is alert and oriented to person, place, and time.  Skin: No rash noted.  Psychiatric: She has a normal mood and affect. Her behavior is normal. Judgment and thought content normal.    Results for orders placed or performed during the hospital encounter of 01/20/18  CBC  Result Value Ref Range   WBC 9.7 3.6 - 11.0 K/uL   RBC 4.54 3.80 - 5.20 MIL/uL   Hemoglobin 14.0 12.0 - 16.0 g/dL  HCT 40.4 35.0 - 47.0 %   MCV 89.1 80.0 - 100.0 fL   MCH 30.8 26.0 - 34.0 pg   MCHC 34.6 32.0 - 36.0 g/dL   RDW 16.1 09.6 - 04.5 %   Platelets 271 150 - 440 K/uL  Type and screen [facility]  Result Value Ref Range   ABO/RH(D) O POS    Antibody Screen NEG    Sample Expiration      01/23/2018 Performed at [facility], [address] [place], [place] [address]   Surgical pathology  Result Value Ref Range   SURGICAL PATHOLOGY      Surgical Pathology CASE: [id number] PATIENT: Shavaun Cozzens Surgical Pathology  Report     SPECIMEN SUBMITTED: A. Bilateral ovaries  CLINICAL HISTORY: None provided  PRE-OPERATIVE DIAGNOSIS: Pelvic pain, H/O LAVH, moderate obesity  POST-OPERATIVE DIAGNOSIS: Pelvic pain, H/O LAVH     DIAGNOSIS: A.  BILATERAL OVARIES; LAPAROSCOPIC BILATERAL OOPHORECTOMY: - HEMORRHAGIC CORPUS LUTEUM. - NEGATIVE FOR ATYPIA AND MALIGNANCY.   GROSS DESCRIPTION: A. Labeled: Bilateral ovaries Received: In formalin Tissue fragment(s): 2 Size: 6 g, 3.2 x 2.0 x 1.5 cm ovary and 14 g focally disrupted 4.1 x 3.2 x 1.5 cm ovary with some attached fibrous tissue and ligating clip in the attached fibrous tissue Description: Both ovaries are lobulated pink-tan. The first described is inked blue and sectioned to reveal a heterogeneous pink-orange cut surface with multiple unilocular cysts up to 0.4 cm.  The second ovary is sectioned to reveal a heterogeneous pink-tan cut surface wit h a 2 cm in greatest dimension serpiginous orange-red area. Entirely submitted in Block summary 1-2 - representative first ovary 3-4 - representative second ovary.   Final Diagnosis performed by Glenice Bow, MD.   Electronically signed 01/21/2018 3:51:58PM The electronic signature indicates that the named Attending Pathologist has evaluated the specimen  Technical component performed at [facility], [address] [place], [place] [address] Lab: [phone] Dir: Jolene Schimke, MD, MMM  Professional component performed at [facility], [address] [place], [place], [place] [address] Lab: [phone] Dir: Georgiann Cocker. Rubinas, MD       Assessment & Plan:   Problem List Items Addressed This Visit      Cardiovascular and Mediastinum   Migraine    Stable       Relevant Medications   buPROPion (WELLBUTRIN XL) 300 MG 24 hr tablet     Other   Conversion disorder    Stable       Relevant Medications   buPROPion (WELLBUTRIN XL) 300 MG 24 hr tablet   Bipolar disorder (HCC)    Relevant Medications   buPROPion (WELLBUTRIN XL) 300 MG 24 hr tablet   Recurrent depression (HCC)    The current medical regimen is effective;  continue present plan and medications.       Relevant Medications   buPROPion (WELLBUTRIN XL) 300 MG 24 hr tablet   Nausea    Because of ongoing nausea and some food intolerances will refer to allergy to further evaluate food allergies.      Relevant Orders   Ambulatory referral to Allergy    Other Visit Diagnoses    PE (physical exam), annual    -  Primary   Relevant Orders   Comprehensive metabolic panel   Lipid panel   CBC with Differential/Platelet   TSH   Urinalysis, Routine w reflex microscopic       Follow up plan: Return in about 6 months (around 01/29/2019).

## 2018-07-31 NOTE — Assessment & Plan Note (Signed)
The current medical regimen is effective;  continue present plan and medications.  

## 2018-08-01 LAB — TSH: TSH: 3.77 u[IU]/mL (ref 0.450–4.500)

## 2018-08-01 LAB — COMPREHENSIVE METABOLIC PANEL
A/G RATIO: 1.5 (ref 1.2–2.2)
ALBUMIN: 4.4 g/dL (ref 3.5–5.5)
ALT: 20 IU/L (ref 0–32)
AST: 19 IU/L (ref 0–40)
Alkaline Phosphatase: 103 IU/L (ref 39–117)
BUN/Creatinine Ratio: 8 — ABNORMAL LOW (ref 9–23)
BUN: 8 mg/dL (ref 6–20)
Bilirubin Total: 1 mg/dL (ref 0.0–1.2)
CO2: 24 mmol/L (ref 20–29)
CREATININE: 1.03 mg/dL — AB (ref 0.57–1.00)
Calcium: 9.6 mg/dL (ref 8.7–10.2)
Chloride: 100 mmol/L (ref 96–106)
GFR, EST AFRICAN AMERICAN: 80 mL/min/{1.73_m2} (ref >59)
GFR, EST NON AFRICAN AMERICAN: 69 mL/min/{1.73_m2} (ref >59)
GLOBULIN, TOTAL: 3 g/dL (ref 1.5–4.5)
Glucose: 82 mg/dL (ref 65–99)
POTASSIUM: 4.9 mmol/L (ref 3.5–5.2)
SODIUM: 139 mmol/L (ref 134–144)
TOTAL PROTEIN: 7.4 g/dL (ref 6.0–8.5)

## 2018-08-01 LAB — CBC WITH DIFFERENTIAL/PLATELET
BASOS ABS: 0 10*3/uL (ref 0.0–0.2)
Basos: 1 %
EOS (ABSOLUTE): 0.1 10*3/uL (ref 0.0–0.4)
Eos: 1 %
Hematocrit: 42.5 % (ref 34.0–46.6)
Hemoglobin: 14.1 g/dL (ref 11.1–15.9)
Immature Grans (Abs): 0 10*3/uL (ref 0.0–0.1)
Immature Granulocytes: 0 %
LYMPHS ABS: 2.6 10*3/uL (ref 0.7–3.1)
Lymphs: 40 %
MCH: 29.3 pg (ref 26.6–33.0)
MCHC: 33.2 g/dL (ref 31.5–35.7)
MCV: 88 fL (ref 79–97)
Monocytes Absolute: 0.6 10*3/uL (ref 0.1–0.9)
Monocytes: 10 %
NEUTROS ABS: 3.2 10*3/uL (ref 1.4–7.0)
Neutrophils: 48 %
PLATELETS: 293 10*3/uL (ref 150–450)
RBC: 4.82 x10E6/uL (ref 3.77–5.28)
RDW: 12.7 % (ref 12.3–15.4)
WBC: 6.5 10*3/uL (ref 3.4–10.8)

## 2018-08-01 LAB — LIPID PANEL
CHOL/HDL RATIO: 5 ratio — AB (ref 0.0–4.4)
Cholesterol, Total: 217 mg/dL — ABNORMAL HIGH (ref 100–199)
HDL: 43 mg/dL (ref >39)
LDL CALC: 137 mg/dL — AB (ref 0–99)
Triglycerides: 183 mg/dL — ABNORMAL HIGH (ref 0–149)
VLDL Cholesterol Cal: 37 mg/dL (ref 5–40)

## 2018-08-05 ENCOUNTER — Encounter: Payer: Self-pay | Admitting: Family Medicine

## 2018-08-20 ENCOUNTER — Ambulatory Visit: Payer: Self-pay | Admitting: *Deleted

## 2018-08-20 NOTE — Telephone Encounter (Signed)
Pt diagnosed with bronchitis last Thursday and now feels shortness of breath and cough. Has a hx of asthma. Has used her inhaler. She does not use a nebulizer. She can talk without difficulty. Denies fever, states cough is productive at times and secretions are white. No dizziness. Per protocol, pt going to an urgent care.  [facility]. Advised to call back for worsening symptoms, pt voiced understanding.  Reason for Disposition . [1] Longstanding difficulty breathing AND [2] not responding to usual therapy  Answer Assessment - Initial Assessment Questions 1. RESPIRATORY STATUS: "Describe your breathing?" (e.g., wheezing, shortness of breath, unable to speak, severe coughing)      Shortness of breath, some wheezing, coughing 2. ONSET: "When did this breathing problem begin?"      Last Thursday 3. PATTERN "Does the difficult breathing come and go, or has it been constant since it started?"      constant 4. SEVERITY: "How bad is your breathing?" (e.g., mild, moderate, severe)    - MILD: No SOB at rest, mild SOB with walking, speaks normally in sentences, can lay down, no retractions, pulse < 100.    - MODERATE: SOB at rest, SOB with minimal exertion and prefers to sit, cannot lie down flat, speaks in phrases, mild retractions, audible wheezing, pulse 100-120.    - SEVERE: Very SOB at rest, speaks in single words, struggling to breathe, sitting hunched forward, retractions, pulse > 120      moderate 5. RECURRENT SYMPTOM: "Have you had difficulty breathing before?" If so, ask: "When was the last time?" and "What happened that time?"      Yes, has asthma 6. CARDIAC HISTORY: "Do you have any history of heart disease?" (e.g., heart attack, angina, bypass surgery, angioplasty)      no 7. LUNG HISTORY: "Do you have any history of lung disease?"  (e.g., pulmonary embolus, asthma, emphysema)     asthma 8. CAUSE: "What do you think is causing the breathing problem?"   bronchitis 9. OTHER SYMPTOMS: "Do you have any other symptoms? (e.g., dizziness, runny nose, cough, chest pain, fever)     Cough, runny nose, chest pain 10. PREGNANCY: "Is there any chance you are pregnant?" "When was your last menstrual period?"       Not pregnant.  had hysterectomy in May 2019 11. TRAVEL: "Have you traveled out of the country in the last month?" (e.g., travel history, exposures)       no  Protocols used: BREATHING DIFFICULTY-A-AH

## 2018-08-28 ENCOUNTER — Encounter: Payer: Self-pay | Admitting: Family Medicine

## 2018-08-28 DIAGNOSIS — Z91018 Allergy to other foods: Secondary | ICD-10-CM

## 2018-10-15 ENCOUNTER — Ambulatory Visit (INDEPENDENT_AMBULATORY_CARE_PROVIDER_SITE_OTHER): Payer: Commercial Managed Care - PPO | Admitting: Gastroenterology

## 2018-10-15 ENCOUNTER — Encounter: Payer: Self-pay | Admitting: Gastroenterology

## 2018-10-15 VITALS — BP 114/76 | HR 73 | Ht 68.0 in | Wt 254.0 lb

## 2018-10-15 DIAGNOSIS — K580 Irritable bowel syndrome with diarrhea: Secondary | ICD-10-CM | POA: Diagnosis not present

## 2018-10-15 MED ORDER — RIFAXIMIN 550 MG PO TABS: 550.0000 mg | ORAL_TABLET | Freq: Three times a day (TID) | ORAL | 0 refills | Status: AC

## 2018-10-15 NOTE — Progress Notes (Signed)
Wyline Mood MD, MRCP(U.K) [address]  [address]  [place], [place] [address]  Main: [phone]  Fax: [phone]   Gastroenterology Consultation  Referring Provider:     Dorcas Carrow, DO Primary Care Physician:  Steele Sizer, MD Primary Gastroenterologist:  Dr. Wyline Mood  Reason for Consultation:     Food allergies        HPI:   Emily [name] is a 38 y.o. y/o female referred for consultation & management  by Dr. Dossie Arbour, Redge Gainer, MD.    She has been referred for multiple food allergies.   07/2018 : CBC with Diff was normal  She says that she has been sent to a food allergist and was advised to a GI.  All began 2 years, since then getting worse. She says that she has upset stomach, nausea and diarrhea. This occurs when she eats- usually develops nausea, between 1-2 hours, then gets abdominal pains in the center of the abdomen like a stabbing in nature. Localized , no other aggrevating factors, not eating helps. A bowel movement does help and when shes does have it is diarrhea- sometimes soft and loose and sometimes watery. Gall bladder intact. Not much soda, once a week, no artificial sugars in her diet . No chewing gum . She does have lot of gas and bloating. Feels looks "pregnant" , foul smelling when she passes gas, denies any heartburn .  Symptoms are worse when she has abdominal distension and passing gas, says her stomach likes vegetables with fruits. Dairy makes it worse.    No weight loss, ex smoker, occasional alcohol. She uses ibuprofen for her knee and uses it twice a week. No other NSAID's. No family history of colon cancer or polyps.   Past Medical History:  Diagnosis Date  . Anemia   . 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 Bipolar 1 disorder (HCC)   . Conversion disorder   . Depression   . Insomnia   . Migraines   . MRSA infection 2010  . Ovarian cyst    left 2.5cm  . Sleep apnea   . Vitamin B 12 deficiency   . Vitamin D deficiency      Past Surgical History:  Procedure Laterality Date  . ABDOMINAL HYSTERECTOMY    . BUNIONECTOMY    . HAMMER TOE SURGERY    . HAND SURGERY  2014   cyst removed  . INTRAUTERINE DEVICE (IUD) INSERTION N/A 05/09/2015   Procedure: INTRAUTERINE DEVICE (IUD) INSERTION;  Surgeon: Hildred Laser, MD;  Location: [facility] ORS;  Service: Gynecology;  Laterality: N/A;  . LAPAROSCOPIC ASSISTED VAGINAL HYSTERECTOMY N/A 06/20/2015   Procedure: LAPAROSCOPIC ASSISTED VAGINAL HYSTERECTOMY, BILATERAL SALPINGECTOMY, CYSTOSCOPY, IUD REMOVAL ;  Surgeon: Hildred Laser, MD;  Location: [facility] ORS;  Service: Gynecology;  Laterality: N/A;  . LAPAROSCOPIC TUBAL LIGATION Bilateral 05/09/2015   Procedure: LAPAROSCOPIC TUBAL LIGATION;  Surgeon: Hildred Laser, MD;  Location: [facility] ORS;  Service: Gynecology;  Laterality: Bilateral;  . NASAL SINUS SURGERY    . TUBAL LIGATION  2007    Prior to Admission medications   Medication Sig Start Date End Date Taking? Authorizing Provider  albuterol (PROVENTIL HFA;VENTOLIN HFA) 108 (90 Base) MCG/ACT inhaler Inhale 2 puffs into the lungs every 6 (six) hours as needed for wheezing or shortness of breath. 10/01/17   [name], NP  buPROPion (WELLBUTRIN XL) 300 MG 24 hr tablet Take 1 tablet (300 mg total) by mouth daily. 07/31/18   Vonita Moss  A, MD  diclofenac sodium (VOLTAREN) 1 % GEL diclofenac 1 % topical gel    [provider]  fluticasone (FLONASE) 50 MCG/ACT nasal spray Place 2 sprays into both nostrils daily as needed for allergies or rhinitis.    [provider]  ibuprofen (ADVIL,MOTRIN) 800 MG tablet Take 1 tablet (800 mg total) by mouth every 8 (eight) hours as needed. 01/20/18   Hildred Laserherry, Anika, MD  PARoxetine Mesylate 7.5 MG CAPS Take 1 tablet by mouth daily. 02/12/18   Hildred Laserherry, Anika, MD    Family History  Problem Relation Age of Onset  . Bipolar disorder Mother   . Anxiety disorder Mother   . Cancer Mother        brain  . Glaucoma Mother   . Heart disease  Father   . Diabetes Father   . Cancer Father   . Bipolar disorder Sister   . Schizophrenia Sister   . Cancer Maternal Grandmother        lung  . Heart disease Maternal Grandfather   . Breast cancer Brother   . Ovarian cancer Neg Hx      Social History   Tobacco Use  . Smoking status: Former Smoker    Packs/day: 1.00    Years: 10.00    Pack years: 10.00    Types: Cigarettes    Last attempt to quit: 09/25/2003    Years since quitting: 15.0  . Smokeless tobacco: Never Used  Substance Use Topics  . Alcohol use: Yes    Alcohol/week: 0.0 - 1.0 standard drinks    Comment: pt states she is a social drinker  . Drug use: No    Allergies as of 10/15/2018 - Review Complete 10/15/2018  Allergen Reaction Noted  . Codeine Diarrhea and Nausea And Vomiting 04/14/2015    Review of Systems:    All systems reviewed and negative except where noted in HPI.   Physical Exam:  BP 114/76   Pulse 73   Ht 5\' 8"  (1.727 m)   Wt 254 lb (115.2 kg)   LMP 06/01/2015 (Within Days)   BMI 38.62 kg/m  Patient's last menstrual period was 06/01/2015 (within days). Psych:  Alert and cooperative. Normal mood and affect. General:   Alert,  Well-developed, well-nourished, pleasant and cooperative in NAD Head:  Normocephalic and atraumatic. Eyes:  Sclera clear, no icterus.   Conjunctiva pink. Ears:  Normal auditory acuity. Nose:  No deformity, discharge, or lesions. Mouth:  No deformity or lesions,oropharynx pink & moist. Neck:  Supple; no masses or thyromegaly. Lungs:  Respirations even and unlabored.  Clear throughout to auscultation.   No wheezes, crackles, or rhonchi. No acute distress. Heart:  Regular rate and rhythm; no murmurs, clicks, rubs, or gallops. Abdomen:  Normal bowel sounds.  No bruits.  Soft, non-tender and non-distended without masses, hepatosplenomegaly or hernias noted.  No guarding or rebound tenderness.    Neurologic:  Alert and oriented x3;  grossly normal neurologically. Skin:   Intact without significant lesions or rashes. No jaundice. Lymph Nodes:  No significant cervical adenopathy. Psych:  Alert and cooperative. Normal mood and affect.  Imaging Studies: No results found.  Assessment and Plan:   Feliz Beammily N Rix is a 38 y.o. y/o female has been referred for  multiple food allergies. History more suggestive of IBS-D.   Plan  1. Food allergy panel 2. Maintain food diary  3. Celiac serology 4. IGE  5. Fecal calprotectin.  6. Round of xifaxan to treat IBS-D.  7. No  NSAID's , high fructose corn syrup, lactose , LOW FODMAP    Follow up in 4 weeks   Dr Wyline MoodKiran Thelton Graca MD,MRCP(U.K)

## 2018-10-17 ENCOUNTER — Telehealth: Payer: Self-pay

## 2018-10-17 ENCOUNTER — Other Ambulatory Visit: Payer: Self-pay

## 2018-10-17 DIAGNOSIS — R1013 Epigastric pain: Secondary | ICD-10-CM

## 2018-10-17 DIAGNOSIS — K580 Irritable bowel syndrome with diarrhea: Secondary | ICD-10-CM

## 2018-10-17 DIAGNOSIS — R110 Nausea: Secondary | ICD-10-CM

## 2018-10-17 LAB — CALPROTECTIN, FECAL: Calprotectin, Fecal: 21 ug/g (ref 0–120)

## 2018-10-17 NOTE — Telephone Encounter (Signed)
Spoke with Emily [name] and informed her of lab results and Dr. Johnney Killian instructions for Emily [name] to proceed with an EGD procedure to confirm lab results. Emily [name] agreed to have procedure. I have explained prior prep instructions and scheduled Emily [name]. Emily [name] is aware that she'll receive written instructions via mychart and by mail.

## 2018-10-17 NOTE — Telephone Encounter (Signed)
-----   Message from Wyline Mood, MD sent at 10/17/2018  7:40 AM EST ----- Emily [name] inform she has a blood test positive  for possible celiac disease- needs EGD to confirm with biopsies. If willing please schedule   C/c Crissman, Redge Gainer, MD    Dr Wyline Mood MD,MRCP [facility]) Gastroenterology/Hepatology Pager: [phone]

## 2018-10-18 LAB — FOOD ALLERGY PROFILE
Allergen Corn, IgE: <0.1 kU/L
Clam IgE: <0.1 kU/L
Codfish IgE: <0.1 kU/L
Egg White IgE: <0.1 kU/L
Milk IgE: <0.1 kU/L
Scallop IgE: <0.1 kU/L
Sesame Seed IgE: <0.1 kU/L
Shrimp IgE: <0.1 kU/L
Soybean IgE: <0.1 kU/L
Walnut IgE: <0.1 kU/L
Wheat IgE: <0.1 kU/L

## 2018-10-18 LAB — CELIAC DISEASE AB SCREEN W/RFX
Antigliadin Abs, IgA: 29 units — ABNORMAL HIGH (ref 0–19)
IGA/IMMUNOGLOBULIN A, SERUM: 368 mg/dL — AB (ref 87–352)
Transglutaminase IgA: <2 U/mL (ref 0–3)

## 2018-10-20 ENCOUNTER — Encounter: Payer: Self-pay | Admitting: Gastroenterology

## 2018-10-27 DIAGNOSIS — K580 Irritable bowel syndrome with diarrhea: Secondary | ICD-10-CM | POA: Insufficient documentation

## 2018-11-03 ENCOUNTER — Ambulatory Visit: Payer: Commercial Managed Care - PPO | Admitting: Anesthesiology

## 2018-11-03 ENCOUNTER — Ambulatory Visit
Admission: RE | Admit: 2018-11-03 | Discharge: 2018-11-03 | Disposition: A | Discharge status: Home / Self Care | Payer: Commercial Managed Care - PPO | Attending: Gastroenterology | Admitting: Gastroenterology

## 2018-11-03 ENCOUNTER — Encounter
Admission: RE | Disposition: A | Discharge status: Home / Self Care | Payer: Self-pay | Source: Home / Self Care | Attending: Gastroenterology

## 2018-11-03 ENCOUNTER — Other Ambulatory Visit: Payer: Self-pay

## 2018-11-03 DIAGNOSIS — J45909 Unspecified asthma, uncomplicated: Secondary | ICD-10-CM | POA: Insufficient documentation

## 2018-11-03 DIAGNOSIS — Z87891 Personal history of nicotine dependence: Secondary | ICD-10-CM | POA: Insufficient documentation

## 2018-11-03 DIAGNOSIS — R1013 Epigastric pain: Secondary | ICD-10-CM | POA: Diagnosis present

## 2018-11-03 DIAGNOSIS — R894 Abnormal immunological findings in specimens from other organs, systems and tissues: Secondary | ICD-10-CM

## 2018-11-03 DIAGNOSIS — R110 Nausea: Secondary | ICD-10-CM | POA: Diagnosis not present

## 2018-11-03 DIAGNOSIS — R768 Other specified abnormal immunological findings in serum: Secondary | ICD-10-CM | POA: Diagnosis not present

## 2018-11-03 DIAGNOSIS — Z791 Long term (current) use of non-steroidal anti-inflammatories (NSAID): Secondary | ICD-10-CM | POA: Insufficient documentation

## 2018-11-03 DIAGNOSIS — F319 Bipolar disorder, unspecified: Secondary | ICD-10-CM | POA: Diagnosis not present

## 2018-11-03 DIAGNOSIS — K580 Irritable bowel syndrome with diarrhea: Secondary | ICD-10-CM

## 2018-11-03 DIAGNOSIS — Z885 Allergy status to narcotic agent status: Secondary | ICD-10-CM | POA: Insufficient documentation

## 2018-11-03 DIAGNOSIS — Z79899 Other long term (current) drug therapy: Secondary | ICD-10-CM | POA: Diagnosis not present

## 2018-11-03 DIAGNOSIS — Z818 Family history of other mental and behavioral disorders: Secondary | ICD-10-CM | POA: Diagnosis not present

## 2018-11-03 HISTORY — PX: ESOPHAGOGASTRODUODENOSCOPY (EGD) WITH PROPOFOL: SHX5813

## 2018-11-03 SURGERY — ESOPHAGOGASTRODUODENOSCOPY (EGD) WITH PROPOFOL
Anesthesia: General

## 2018-11-03 MED ORDER — SODIUM CHLORIDE 0.9 % IV SOLN
INTRAVENOUS | Status: DC
  Administered 2018-11-03: 09:00:00 via INTRAVENOUS

## 2018-11-03 MED ORDER — PROPOFOL 10 MG/ML IV BOLUS
INTRAVENOUS | Status: DC | PRN
  Administered 2018-11-03 (×2): 50 mg via INTRAVENOUS

## 2018-11-03 MED ORDER — LIDOCAINE HCL (CARDIAC) PF 100 MG/5ML IV SOSY
PREFILLED_SYRINGE | INTRAVENOUS | Status: DC | PRN
  Administered 2018-11-03: 30 mg via INTRAVENOUS

## 2018-11-03 MED ORDER — PROPOFOL 500 MG/50ML IV EMUL
INTRAVENOUS | Status: DC | PRN
  Administered 2018-11-03: 125 ug/kg/min via INTRAVENOUS

## 2018-11-03 MED ORDER — ONDANSETRON HCL 4 MG/2ML IJ SOLN
INTRAMUSCULAR | Status: AC
  Filled 2018-11-03: qty 2

## 2018-11-03 MED ORDER — ONDANSETRON HCL 4 MG/2ML IJ SOLN
INTRAMUSCULAR | Status: DC | PRN
  Administered 2018-11-03: 4 mg via INTRAVENOUS

## 2018-11-03 MED ORDER — PROPOFOL 500 MG/50ML IV EMUL
INTRAVENOUS | Status: AC
  Filled 2018-11-03: qty 50

## 2018-11-03 NOTE — Transfer of Care (Signed)
Immediate Anesthesia Transfer of Care Note  Patient: Emily [name]  Procedure(s) Performed: ESOPHAGOGASTRODUODENOSCOPY (EGD) WITH PROPOFOL (N/A )  Patient Location: PACU and Endoscopy Unit  Anesthesia Type:General  Level of Consciousness: awake, alert  and oriented  Airway & Oxygen Therapy: Patient Spontanous Breathing  Post-op Assessment: Report given to RN and Post -op Vital signs reviewed and stable  Post vital signs: Reviewed and stable  Last Vitals:  Vitals Value Taken Time  BP 123/77 11/03/2018  9:44 AM  Temp 36.7 C 11/03/2018  9:44 AM  Pulse 77 11/03/2018  9:45 AM  Resp 14 11/03/2018  9:45 AM  SpO2 96 % 11/03/2018  9:45 AM  Vitals shown include unvalidated device data.  Last Pain:  Vitals:   11/03/18 0944  TempSrc: Tympanic  PainSc: 0-No pain         Complications: No apparent anesthesia complications

## 2018-11-03 NOTE — Anesthesia Post-op Follow-up Note (Signed)
Anesthesia QCDR form completed.        

## 2018-11-03 NOTE — Anesthesia Postprocedure Evaluation (Signed)
Anesthesia Post Note  Patient: Emily [name]  Procedure(s) Performed: ESOPHAGOGASTRODUODENOSCOPY (EGD) WITH PROPOFOL (N/A )  Patient location during evaluation: Endoscopy Anesthesia Type: General Level of consciousness: awake and alert Pain management: pain level controlled Vital Signs Assessment: post-procedure vital signs reviewed and stable Respiratory status: spontaneous breathing, nonlabored ventilation, respiratory function stable and patient connected to nasal cannula oxygen Cardiovascular status: blood pressure returned to baseline and stable Postop Assessment: no apparent nausea or vomiting Anesthetic complications: no     Last Vitals:  Vitals:   11/03/18 1004 11/03/18 1014  BP: 122/77 120/82  Pulse: 63 64  Resp: 10 13  Temp:    SpO2: 98% 98%    Last Pain:  Vitals:   11/03/18 1014  TempSrc:   PainSc: 0-No pain                 Lenard Simmer

## 2018-11-03 NOTE — Op Note (Signed)
[facility] Gastroenterology Patient Name: Emily [name] Procedure Date: 11/03/2018 9:39 AM MRN: [id number] Account #: [id number] Date of Birth: [birth year] Admit Type: Outpatient Age: [age] [facility] 4 Gender: Female Note Status: Finalized Procedure:            Upper GI endoscopy Indications:          Positive celiac serologies Providers:            Wyline MoodKiran Howard Bunte MD, MD Medicines:            Monitored Anesthesia Care Complications:        No immediate complications. Procedure:            Pre-Anesthesia Assessment:                       - Prior to the procedure, a History and Physical was                        performed, and patient medications, allergies and                        sensitivities were reviewed. The patient's tolerance of                        previous anesthesia was reviewed.                       - The risks and benefits of the procedure and the                        sedation options and risks were discussed with the                        patient. All questions were answered and informed                        consent was obtained.                       - ASA Grade Assessment: II - A patient with mild                        systemic disease.                       After obtaining informed consent, the endoscope was                        passed under direct vision. Throughout the procedure,                        the patient's blood pressure, pulse, and oxygen                        saturations were monitored continuously. The Endoscope                        was introduced through the mouth, and advanced to the                        third part of duodenum. The upper GI endoscopy was  accomplished with ease. The patient tolerated the                        procedure well. Findings:      The stomach was normal.      The esophagus was normal.      The examined duodenum was normal. Biopsies for histology were taken  with       a cold forceps for evaluation of celiac disease. Impression:           - Normal stomach.                       - Normal esophagus.                       - Normal examined duodenum. Biopsied. Recommendation:       - Discharge patient to home (with escort).                       - Resume previous diet.                       - Continue present medications.                       - Await pathology results.                       - Return to my office as previously scheduled. Procedure Code(s):    --- Professional ---                       [phone], Esophagogastroduodenoscopy, flexible, transoral;                        with biopsy, single or multiple Diagnosis Code(s):    --- Professional ---                       R76.8, Other specified abnormal immunological findings                        in serum CPT copyright 2018 American Medical Association. All rights reserved. The codes documented in this report are preliminary and upon coder review may  be revised to meet current compliance requirements. Wyline Mood, MD Wyline Mood MD, MD 11/03/2018 9:39:57 AM This report has been signed electronically. Number of Addenda: 0 Note Initiated On: 11/03/2018 9:22 AM      [facility]

## 2018-11-03 NOTE — Anesthesia Preprocedure Evaluation (Signed)
Anesthesia Evaluation  Patient identified by MRN, date of birth, ID band Patient awake    Reviewed: Allergy & Precautions, H&P , NPO status , Patient's Chart, lab work & pertinent test results, reviewed documented beta blocker date and time   History of Anesthesia Complications Negative for: history of anesthetic complications  Airway Mallampati: II  TM Distance: >3 FB Neck ROM: full  Mouth opening: Limited Mouth Opening  Dental  (+) Teeth Intact, Poor Dentition, Dental Advidsory Given, Caps, Missing, Chipped   Pulmonary neg pulmonary ROS, neg shortness of breath, asthma , sleep apnea and Continuous Positive Airway Pressure Ventilation , neg COPD, neg recent URI, former smoker,           Cardiovascular Exercise Tolerance: Good negative cardio ROS       Neuro/Psych  Headaches, PSYCHIATRIC DISORDERS Depression Bipolar Disorder  Neuromuscular disease negative neurological ROS  negative psych ROS   GI/Hepatic negative GI ROS, Neg liver ROS,   Endo/Other  negative endocrine ROS  Renal/GU negative Renal ROS  negative genitourinary   Musculoskeletal   Abdominal   Peds  Hematology negative hematology ROS (+) Blood dyscrasia, anemia ,   Anesthesia Other Findings Past Medical History: No date: Anemia No date: Asthma     Comment:  sports induced-no inhalers No date: Bipolar 1 disorder (HCC) No date: Conversion disorder No date: Depression No date: Insomnia No date: Migraines 2010: MRSA infection No date: Ovarian cyst     Comment:  left 2.5cm No date: Sleep apnea No date: Vitamin B 12 deficiency No date: Vitamin D deficiency Past Surgical History: No date: ABDOMINAL HYSTERECTOMY No date: BUNIONECTOMY No date: HAMMER TOE SURGERY 2014: HAND SURGERY     Comment:  cyst removed 05/09/2015: INTRAUTERINE DEVICE (IUD) INSERTION; N/A     Comment:  Procedure: INTRAUTERINE DEVICE (IUD) INSERTION;                Surgeon:  Hildred LaserAnika Cherry, MD;  Location: [facility] ORS;  Service:              Gynecology;  Laterality: N/A; 06/20/2015: LAPAROSCOPIC ASSISTED VAGINAL HYSTERECTOMY; N/A     Comment:  Procedure: LAPAROSCOPIC ASSISTED VAGINAL HYSTERECTOMY,               BILATERAL SALPINGECTOMY, CYSTOSCOPY, IUD REMOVAL ;                Surgeon: Hildred LaserAnika Cherry, MD;  Location: [facility] ORS;  Service:              Gynecology;  Laterality: N/A; 05/09/2015: LAPAROSCOPIC TUBAL LIGATION; Bilateral     Comment:  Procedure: LAPAROSCOPIC TUBAL LIGATION;  Surgeon: Hildred LaserAnika               Cherry, MD;  Location: [facility] ORS;  Service: Gynecology;                Laterality: Bilateral; No date: NASAL SINUS SURGERY 2007: TUBAL LIGATION BMI    Body Mass Index:  36.48 kg/m     Reproductive/Obstetrics negative OB ROS                             Anesthesia Physical  Anesthesia Plan  ASA: III  Anesthesia Plan: General ETT   Post-op Pain Management:    Induction: Intravenous  PONV Risk Score and Plan: 3 and Propofol infusion and TIVA  Airway Management Planned: Nasal Cannula and Natural Airway  Additional Equipment:   Intra-op Plan:   Post-operative  Plan:   Informed Consent: I have reviewed the patients History and Physical, chart, labs and discussed the procedure including the risks, benefits and alternatives for the proposed anesthesia with the patient or authorized representative who has indicated his/her understanding and acceptance.     Dental Advisory Given  Plan Discussed with: CRNA  Anesthesia Plan Comments:         Anesthesia Quick Evaluation

## 2018-11-03 NOTE — Addendum Note (Signed)
Addendum  created 11/03/18 1201 by Paulette BlanchParas, Modelle Vollmer, CRNA   Intraprocedure Meds edited

## 2018-11-03 NOTE — H&P (Signed)
Wyline Mood, MD [address], [address], [place], [place], [address] [address], [address], [place], [place], [address] Phone: [phone]  Fax: [phone]  Primary Care Physician:  Steele Sizer, MD   Pre-Procedure History & Physical: HPI:  Emily [name] is a 38 y.o. female is here for an endoscopy    Past Medical History:  Diagnosis Date  . Anemia   . Asthma    sports induced inhalers  . Bipolar 1 disorder (HCC)   . Conversion disorder   . Insomnia   . Migraines    migraines  . MRSA infection 2010  . Ovarian cyst    left 2.5cm  . Sleep apnea    does not use a CPAP  . Vitamin B 12 deficiency   . Vitamin D deficiency     Past Surgical History:  Procedure Laterality Date  . ABDOMINAL HYSTERECTOMY    . BUNIONECTOMY    . HAMMER TOE SURGERY    . HAND SURGERY  2014   cyst removed  . INTRAUTERINE DEVICE (IUD) INSERTION N/A 05/09/2015   Procedure: INTRAUTERINE DEVICE (IUD) INSERTION;  Surgeon: Hildred Laser, MD;  Location: [facility] ORS;  Service: Gynecology;  Laterality: N/A;  . LAPAROSCOPIC ASSISTED VAGINAL HYSTERECTOMY N/A 06/20/2015   Procedure: LAPAROSCOPIC ASSISTED VAGINAL HYSTERECTOMY, BILATERAL SALPINGECTOMY, CYSTOSCOPY, IUD REMOVAL ;  Surgeon: Hildred Laser, MD;  Location: [facility] ORS;  Service: Gynecology;  Laterality: N/A;  . LAPAROSCOPIC TUBAL LIGATION Bilateral 05/09/2015   Procedure: LAPAROSCOPIC TUBAL LIGATION;  Surgeon: Hildred Laser, MD;  Location: [facility] ORS;  Service: Gynecology;  Laterality: Bilateral;  . NASAL SINUS SURGERY    . TUBAL LIGATION  2007    Prior to Admission medications   Medication Sig Start Date End Date Taking? Authorizing Provider  albuterol (PROVENTIL HFA;VENTOLIN HFA) 108 (90 Base) MCG/ACT inhaler Inhale 2 puffs into the lungs every 6 (six) hours as needed for wheezing or shortness of breath. 10/01/17  Yes [name], NP  buPROPion (WELLBUTRIN XL) 300 MG 24 hr tablet Take 1 tablet (300 mg total) by mouth daily. 07/31/18  Yes Crissman,  Redge Gainer, MD  fluticasone (FLONASE) 50 MCG/ACT nasal spray Place 2 sprays into both nostrils daily as needed for allergies or rhinitis.   Yes [provider]  ibuprofen (ADVIL,MOTRIN) 800 MG tablet Take 1 tablet (800 mg total) by mouth every 8 (eight) hours as needed. 01/20/18  Yes Hildred Laser, MD  diclofenac sodium (VOLTAREN) 1 % GEL diclofenac 1 % topical gel    [provider]  gabapentin (NEURONTIN) 300 MG capsule gabapentin 300 mg capsule  1 PO qhs x 7 days then 1 PO BID    [provider]  PARoxetine (PAXIL) 10 MG tablet  10/06/18   [provider]  PARoxetine Mesylate 7.5 MG CAPS Take 1 tablet by mouth daily. Patient not taking: Reported on 10/15/2018 02/12/18   Hildred Laser, MD  traMADol Janean Sark) 50 MG tablet tramadol 50 mg tablet    [provider]    Allergies as of 10/20/2018 - Review Complete 10/15/2018  Allergen Reaction Noted  . Codeine Diarrhea and Nausea And Vomiting 04/14/2015    Family History  Problem Relation Age of Onset  . Bipolar disorder Mother   . Anxiety disorder Mother   . Cancer Mother        brain  . Glaucoma Mother   . Heart disease Father   . Diabetes Father   . Cancer Father   . Bipolar disorder Sister   .  Schizophrenia Sister   . Cancer Maternal Grandmother        lung  . Heart disease Maternal Grandfather   . Breast cancer Brother   . Ovarian cancer Neg Hx     Social History   Socioeconomic History  . Marital status: Married    Spouse name: Not on file  . Number of children: Not on file  . Years of education: Not on file  . Highest education level: Not on file  Occupational History  . Not on file  Social Needs  . Financial resource strain: Not on file  . Food insecurity:    Worry: Not on file    Inability: Not on file  . Transportation needs:    Medical: Not on file    Non-medical: Not on file  Tobacco Use  . Smoking status: Former Smoker    Packs/day: 1.00    Years: 10.00    Pack  years: 10.00    Types: Cigarettes    Last attempt to quit: 09/25/2003    Years since quitting: 15.1  . Smokeless tobacco: Never Used  Substance and Sexual Activity  . Alcohol use: Yes    Alcohol/week: 0.0 - 1.0 standard drinks    Comment: pt states she is a social drinker  . Drug use: No  . Sexual activity: Yes    Birth control/protection: Surgical  Lifestyle  . Physical activity:    Days per week: Not on file    Minutes per session: Not on file  . Stress: Not on file  Relationships  . Social connections:    Talks on phone: Not on file    Gets together: Not on file    Attends religious service: Not on file    Active member of club or organization: Not on file    Attends meetings of clubs or organizations: Not on file    Relationship status: Not on file  . Intimate partner violence:    Fear of current or ex partner: Not on file    Emotionally abused: Not on file    Physically abused: Not on file    Forced sexual activity: Not on file  Other Topics Concern  . Not on file  Social History Narrative  . Not on file    Review of Systems: See HPI, otherwise negative ROS  Physical Exam: BP 121/79   Pulse 67   Temp 98.2 F (36.8 C) (Oral)   Resp 18   Ht 5\' 8"  (1.727 m)   Wt 114.3 kg   LMP 06/01/2015 (Within Days)   SpO2 100%   BMI 38.32 kg/m  General:   Alert,  pleasant and cooperative in NAD Head:  Normocephalic and atraumatic. Neck:  Supple; no masses or thyromegaly. Lungs:  Clear throughout to auscultation, normal respiratory effort.    Heart:  +S1, +S2, Regular rate and rhythm, No edema. Abdomen:  Soft, nontender and nondistended. Normal bowel sounds, without guarding, and without rebound.   Neurologic:  Alert and  oriented x4;  grossly normal neurologically.  Impression/Plan: Emily [name] is here for an endoscopy  to be performed for  evaluation of celiac disease    Risks, benefits, limitations, and alternatives regarding endoscopy have been reviewed with the  patient.  Questions have been answered.  All parties agreeable.   Wyline Mood, MD  11/03/2018, 9:26 AM

## 2018-11-04 LAB — SURGICAL PATHOLOGY

## 2018-11-05 ENCOUNTER — Encounter: Payer: Self-pay | Admitting: Gastroenterology

## 2018-11-19 ENCOUNTER — Encounter: Payer: Self-pay | Admitting: Gastroenterology

## 2018-11-19 ENCOUNTER — Ambulatory Visit (INDEPENDENT_AMBULATORY_CARE_PROVIDER_SITE_OTHER): Payer: Commercial Managed Care - PPO | Admitting: Gastroenterology

## 2018-11-19 ENCOUNTER — Other Ambulatory Visit: Payer: Self-pay

## 2018-11-19 VITALS — BP 103/70 | HR 78 | Ht 68.0 in | Wt 256.0 lb

## 2018-11-19 DIAGNOSIS — K580 Irritable bowel syndrome with diarrhea: Secondary | ICD-10-CM

## 2018-11-19 NOTE — Progress Notes (Signed)
Wyline Mood MD, MRCP(U.K) [address]  [address]  [place], [place] [address]  Main: [phone]  Fax: [phone]   Primary Care Physician: Steele Sizer, MD  Primary Gastroenterologist:  Dr. Wyline Mood   Chief Complaint  Patient presents with  . Follow-up    Irritable bowel syndrome    HPI: ULYSSA CROWNOVER is a 38 y.o. female    Summary of history : She is here today to follow up to her initial visit on 10/15/2018 when she was referred for possible food allergies.  All began 2 years, since then getting worse. She says that she has upset stomach, nausea and diarrhea. This occurs when she eats- usually develops nausea, between 1-2 hours, then gets abdominal pains in the center of the abdomen like a stabbing in nature. Localized , no other aggrevating factors, not eating helps. A bowel movement does help and when shes does have it is diarrhea- sometimes soft and loose and sometimes watery. Gall bladder intact. Not much soda, once a week, no artificial sugars in her diet . No chewing gum . She does have lot of gas and bloating. Feels looks "pregnant" , foul smelling when she passes gas, denies any heartburn . Symptoms are worse when she has abdominal distension and passing gas, says her stomach likes vegetables with fruits. Dairy makes it worse.    No weight loss, ex smoker, occasional alcohol. She uses ibuprofen for her knee and uses it twice a week. No other NSAID's. No family history of colon cancer or polyps.   Interval history   10/15/2018-  11/19/2018  10/15/2018: Anti gliadin ab +, food allergy profile and fecal calprotectin negative.   11/03/2018: EGD: normal duodenal bx in the third portion but in the bulb showed mildly blunted villi with no intraepithelial lymphocytosis   After a course of xifaxan felt significantly better. Having 2-3 bowel movements . Thinks she is doing fine, not much bloating. Has cut down all the artificial sugars in her diet . No NSAID's.    Current Outpatient Medications  Medication Sig Dispense Refill  . albuterol (PROVENTIL HFA;VENTOLIN HFA) 108 (90 Base) MCG/ACT inhaler Inhale 2 puffs into the lungs every 6 (six) hours as needed for wheezing or shortness of breath. 1 Inhaler 2  . buPROPion (WELLBUTRIN XL) 300 MG 24 hr tablet Take 1 tablet (300 mg total) by mouth daily. 30 tablet 12  . fluticasone (FLONASE) 50 MCG/ACT nasal spray Place 2 sprays into both nostrils daily as needed for allergies or rhinitis.    Marland Kitchen ibuprofen (ADVIL,MOTRIN) 800 MG tablet Take 1 tablet (800 mg total) by mouth every 8 (eight) hours as needed. 60 tablet 1  . PARoxetine (PAXIL) 10 MG tablet     . diclofenac sodium (VOLTAREN) 1 % GEL diclofenac 1 % topical gel    . gabapentin (NEURONTIN) 300 MG capsule gabapentin 300 mg capsule  1 PO qhs x 7 days then 1 PO BID    . PARoxetine Mesylate 7.5 MG CAPS Take 1 tablet by mouth daily. (Patient not taking: Reported on 10/15/2018) 30 capsule 11  . traMADol (ULTRAM) 50 MG tablet tramadol 50 mg tablet     No current facility-administered medications for this visit.     Allergies as of 11/19/2018 - Review Complete 11/19/2018  Allergen Reaction Noted  . Codeine Diarrhea and Nausea And Vomiting 04/14/2015    ROS:  General: Negative for anorexia, weight loss, fever, chills, fatigue, weakness. ENT: Negative for hoarseness, difficulty swallowing , nasal  congestion. CV: Negative for chest pain, angina, palpitations, dyspnea on exertion, peripheral edema.  Respiratory: Negative for dyspnea at rest, dyspnea on exertion, cough, sputum, wheezing.  GI: See history of present illness. GU:  Negative for dysuria, hematuria, urinary incontinence, urinary frequency, nocturnal urination.  Endo: Negative for unusual weight change.    Physical Examination:   BP 103/70   Pulse 78   Ht 5\' 8"  (1.727 m)   Wt 256 lb (116.1 kg)   LMP 06/01/2015 (Within Days)   BMI 38.92 kg/m   General: Well-nourished, well-developed in no  acute distress.  Eyes: No icterus. Conjunctivae pink. Mouth: Oropharyngeal mucosa moist and pink , no lesions erythema or exudate. Lungs: Clear to auscultation bilaterally. Non-labored. Heart: Regular rate and rhythm, no murmurs rubs or gallops.  Abdomen: Bowel sounds are normal, nontender, nondistended, no hepatosplenomegaly or masses, no abdominal bruits or hernia , no rebound or guarding.   Extremities: No lower extremity edema. No clubbing or deformities. Neuro: Alert and oriented x 3.  Grossly intact. Skin: Warm and dry, no jaundice.   Psych: Alert and cooperative, normal mood and affect.   Imaging Studies: No results found.  Assessment and Plan:   BRUNHILDA LANS is a 38 y.o. y/o female here to see for IBS-D- doing well. Discussed she may have a degree of lactose intolerance. Suggest lactaid pills trial as needed.    Dr Wyline Mood  MD,MRCP [facility]) Follow up in PRN

## 2018-11-28 ENCOUNTER — Other Ambulatory Visit: Payer: Self-pay

## 2018-11-28 ENCOUNTER — Ambulatory Visit: Payer: Commercial Managed Care - PPO | Attending: Internal Medicine

## 2018-11-28 DIAGNOSIS — R2689 Other abnormalities of gait and mobility: Secondary | ICD-10-CM | POA: Diagnosis present

## 2018-11-28 DIAGNOSIS — G8929 Other chronic pain: Secondary | ICD-10-CM | POA: Insufficient documentation

## 2018-11-28 DIAGNOSIS — M6281 Muscle weakness (generalized): Secondary | ICD-10-CM | POA: Diagnosis present

## 2018-11-28 DIAGNOSIS — M25561 Pain in right knee: Secondary | ICD-10-CM | POA: Insufficient documentation

## 2018-11-28 NOTE — Patient Instructions (Signed)
  HEP:   Access Code: [email or web address]  URL: [email or web address]  Date: 11/28/2018  Prepared by: Precious Bard   Exercises  . ITB Stretch at Wall - 2 reps - 2 sets - 30 hold - 1x daily - 7x weekly  . Seated Hip Adduction Squeeze with Ball - 10 reps - 2 sets - 5 hold - 1x daily - 7x weekly  . Seated Hamstring Stretch - 2 reps - 2 sets - 30 hold - 1x daily - 7x weekly  . Seated Ankle Alphabet - 1 reps - 2 sets - 1 hold - 1x daily - 7x weekly

## 2018-11-28 NOTE — Therapy (Addendum)
[facility] [address] [place], [place], [address] Phone: [phone]   Fax:  [phone]  Physical Therapy Evaluation  Patient Details  Name: Emily [name] MRN: [id number] Date of Birth: 39/05/1981 Referring Provider (PT): Dr. Marguarite ArbourKajal Zalavadia   Encounter Date: 39/02/2019  PT End of Session - 11/28/18 0920    Visit Number  1    Number of Visits  8    Date for PT Re-Evaluation  01/23/19    Authorization Type  1/10 start 11/28/2018    PT Start Time  0802    PT Stop Time  0858    PT Time Calculation (min)  56 min    Equipment Utilized During Treatment  Gait belt    Activity Tolerance  Patient limited by pain    Behavior During Therapy  [facility] for tasks assessed/performed       Past Medical History:  Diagnosis Date  . Anemia   . Asthma    sports induced inhalers  . Bipolar 1 disorder (HCC)   . Conversion disorder   . Insomnia   . Migraines    migraines  . MRSA infection 2010  . Ovarian cyst    left 2.5cm  . Sleep apnea    does not use a CPAP  . Vitamin B 12 deficiency   . Vitamin D deficiency     Past Surgical History:  Procedure Laterality Date  . ABDOMINAL HYSTERECTOMY    . BUNIONECTOMY    . ESOPHAGOGASTRODUODENOSCOPY (EGD) WITH PROPOFOL N/A 11/03/2018   Procedure: ESOPHAGOGASTRODUODENOSCOPY (EGD) WITH PROPOFOL;  Surgeon: Wyline MoodAnna, Kiran, MD;  [facility];  Service: Gastroenterology;  Laterality: N/A;  . HAMMER TOE SURGERY    . HAND SURGERY  2014   cyst removed  . INTRAUTERINE DEVICE (IUD) INSERTION N/A 05/09/2015   Procedure: INTRAUTERINE DEVICE (IUD) INSERTION;  Surgeon: Hildred LaserAnika Cherry, MD;  Location: [facility] ORS;  Service: Gynecology;  Laterality: N/A;  . LAPAROSCOPIC ASSISTED VAGINAL HYSTERECTOMY N/A 06/20/2015   Procedure: LAPAROSCOPIC ASSISTED VAGINAL HYSTERECTOMY, BILATERAL SALPINGECTOMY, CYSTOSCOPY, IUD REMOVAL ;  Surgeon: Hildred LaserAnika Cherry, MD;  Location: [facility] ORS;  Service: Gynecology;  Laterality:  N/A;  . LAPAROSCOPIC TUBAL LIGATION Bilateral 05/09/2015   Procedure: LAPAROSCOPIC TUBAL LIGATION;  Surgeon: Hildred LaserAnika Cherry, MD;  Location: [facility] ORS;  Service: Gynecology;  Laterality: Bilateral;  . NASAL SINUS SURGERY    . TUBAL LIGATION  2007    There were no vitals filed for this visit.   Subjective Assessment - 11/28/18 0815    Subjective  Patient reports that she is doing OK today. Patient initially unsure of purpose of PT services at this point, but following discussion expresses interest in physical therapy services at this time to improve strength and overall level of function. Denies R knee pain today due a recent nerve block placed 2 weeks ago. However, she has had a recent exacerbation of back pain that she was prescribed muscle relaxors for.    Pertinent History  The patient presents to PT with generalized muscle weakness and chronic R knee pain that began approximately 1 year ago when she was running a half marathon. Patient notes that when she was running, she felt 2 big 'pops' in her knee, and was unable to finish the race due to high pain levels. However, she reports 0/10 R knee pain today due to a recent nerve block, which she states is beginning to wear off. However, patient continues to experience her knee popping 7-10x/day and knee buckling/giving  out on her 2-3x/day which causes her to lose her balance. Patient denies falls, but reports that she has 4-5 'near falls'/day due to her pain and feeling of her knee giving out. Patient notes that she used to use a SPC due to pain and balance issues, but has not as much recently due to the nerve block. Patient notes that she has increased pain and difficulty with squatting, stairs, prolonged walking, running, balance, and walking on uneven ground. Patient has had previous MRIs for her R knee, which she says all came back negative/clear. 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 Patient also reports a recent exacerbation of her chronic low back pain that began 2 weeks ago.     Limitations  Sitting;Lifting;Standing;Walking;House hold activities;Other (comment)   farm work, running   How long can you sit comfortably?  1 hour    How long can you stand comfortably?  1 hour    How long can you walk comfortably?  half mile    Diagnostic tests  MRI (no findings/negative)    Patient Stated Goals  stairs, be able to go on cruise without losing balance, return to running    Currently in Pain?  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 Marguarite Arbour    Onset Date/Surgical Date  11/22/17   patient indicates approximately 1 year ago   Hand Dominance  Right    Next MD Visit  12/08/2018 with Dr. Hoy Register    Prior Therapy  previous outpatient PT for her R knee following injury      Precautions   Precautions  None    Required Braces or Orthoses  --   not currently wearing; patient has foot orthotics     Restrictions   Weight Bearing Restrictions  No      Balance Screen   Has the patient fallen in the past 6 months  No   4-5 near falls/day   Has the patient had a decrease in activity level  because of a fear of falling?   Yes    Is the patient reluctant to leave their home because of a fear of falling?   Yes      Home Environment   Living Environment  Private residence    Living Arrangements  Children    Type of Home  House    Home Access  Stairs to enter    Entrance Stairs-Number of Steps  3    Entrance  Stairs-Rails  Right    Home Layout  Two level    Alternate Level Stairs-Number of Steps  12   to basement   Alternate Level Stairs-Rails  Can reach both    Home Equipment  Cane - single point      Prior Function   Level of Independence  Independent    Vocation  --   homemaker   Vocation Requirements  carrying, lifting, walking (farm work)    Leisure  running, read, knit, going to gym      Cognition   Overall Cognitive Status  Within Functional Limits for tasks assessed       PAIN: Back pain at rest 7/10 (worsens with physical activity) Knee pain: currently 0/10 due to nerve block  Palpation: Tenderness to palpation of RLE: pes anserine, medial and lateral joint lines, medial aspect of knee along MCL, patellar tendon  POSTURE: Sitting: minimal forward head rounded shoulders posture, sits with RLE more extended and forward than LLE, frequent weight shifts/repositioing in chair  Standing: minimal forward head rounded shoulders posture, slight weight shift onto LLE (decreased WB RLE), RLE externally rotated/toe out and slightly in front of LLE, minimal trunk flexion  AROM: Hip flexion: grossly WFL Knee flexion: grossly WFL  Knee extension: WFL LLE, unable to achieve neutral (achieves approximately lacking 5 degrees, PROM to lacking 2 degrees)  Ankle dorsiflexion: grossly WFL Ankle plantarflexion: grossly WFL   Muscle Flexibility: Hip flexors: tight bilaterally Ober's (IT band): tight bilaterally Hamstrings: tight bilaterally (RLE > LLE)   STRENGTH:  Graded on a 0-5 scale Muscle Group Left Right  Hip Flex 4+ 4  Hip Abd 4 4-*  Hip Add 4 4  Knee  Flex 4 4-*  Knee Ext 4+ 4-*  Ankle DF 4+ 4+  Ankle PF (sitting) 4+ 4*  *indicates painful   SPECIAL TESTS: McMurray's for Menisci: Increased pain with testing of medial meniscus Valgus stress test an 0/30 degrees for MCL: Increased pain with minor instability Varus stress test at 0/30 degrees for LCL: Negative Anterior Drawer for ACL: Negative Posterior Drawer for PCL: Negative   FUNCTIONAL MOBILITY: Sit to stand and stand to sit: increased trunk flexion, able to perform with UE support, slight lean towards LLE during performance to allow for decreased weight bearing on RLE; patient indicates painful on low back  Bed mobility: independent but slow and impaired at this time due to back pain  Stairs: excessively narrow BOS; requires BUE support to assist with push and for balance; increases knee pain (descending > ascending), bears weight on lateral aspects of feet, significant ER of RLE; alternating pattern for ascending and descending  Joint Mobility: Appropriate mobility of patella with inferior/superior/medial/lateral glides, but patient reports increased 'discomfort' ("not pain") with medial/lateral glides  BALANCE: Static Sitting Balance  Normal Able to maintain balance against maximal resistance   Good Able to maintain balance against moderate resistance x  Good-/Fair+ Accepts minimal resistance   Fair Able to sit unsupported without balance loss and without UE support   Poor+ Able to maintain with Minimal assistance from individual or chair   Poor Unable to maintain balance-requires mod/max support from individual or chair    Dynamic Sitting Balance  Normal Able to sit unsupported and weight shift across midline maximally   Good Able to sit unsupported and weight shift across midline moderately   Good-/Fair+ Able to sit unsupported and weight shift across midline minimally x  Fair Minimal weight shifting ipsilateral/front, difficulty crossing midline   Fair-  Reach to  ipsilateral side and unable to weight shift   Poor + Able to sit unsupported with min A and reach to ipsilateral side, unable to weight shift   Poor Able to sit unsupported with mod A and reach ipsilateral/front-can't cross midline     Static Standing Balance  Normal Able to maintain standing balance against maximal resistance   Good Able to maintain standing balance against moderate resistance   Good-/Fair+ Able to maintain standing balance against minimal resistance   Fair Able to stand unsupported without UE support and without LOB for 1-2 min x  Fair- Requires Min A and UE support to maintain standing without loss of balance   Poor+ Requires mod A and UE support to maintain standing without loss of balance   Poor Requires max A and UE support to maintain standing balance without loss     Standing Dynamic Balance  Normal Stand independently unsupported, able to weight shift and cross midline maximally   Good Stand independently unsupported, able to weight shift and cross midline moderately   Good-/Fair+ Stand independently unsupported, able to weight shift across midline minimally   Fair Stand independently unsupported, weight shift, and reach ipsilaterally, loss of balance when crossing midline x  Poor+ Able to stand with Min A and reach ipsilaterally, unable to weight shift   Poor Able to stand with Mod A and minimally reach ipsilaterally, unable to cross midline.      GAIT (without AD today): Slightly antalgic R (decreased stance time on R) Decreased push off bilaterally Minimally decreased gait speed Excessive ER/toe out of RLE Weight bearing predominantly on lateral aspects of both feet Minimally increased trunk lean/lateral weight shift towards L thorughout Decreased RLE flexion during swing   OUTCOME MEASURES: TEST Outcome Interpretation  5 times sit<>stand 15 sec <60 yo, >10 sec indicates increased risk for falls  6 minute walk test           630     Feet 1000 feet is  community ambulator; test discontinued with 2:15 remaining due to pain  LEFS             21/80 61-80% impairment and inability to perform activities    This patient presents with 3 personal factors/ comorbidities, and 4 body elements including body structures and functions, activity limitations and or participation restrictions. Patient's condition is evolving.   Objective measurements completed on examination: See above findings.    Treatment:  Education and performance of HEP/Exercises: -Standing IT band stretch x30 seconds each LE; patient requires verbal cues and education for what is appropriate stretch and is instructed to not push through pain -Ankle alphabet each LE (performs through 'J' to ensure form and ability to perform) -Seated hamstring stretch x45 seconds each LE -Seated adduction with ball x10 with 3 second holds          PT Education - 11/28/18 0919    Education Details  POC, HEP, purpose of PT, patient's room for improvement with PT, stretching, strengthening, pain with exercise    Person(s) Educated  Patient    Methods  Explanation;Demonstration;Tactile cues;Verbal cues;Handout    Comprehension  Returned demonstration;Verbalized understanding;Verbal cues required;Tactile cues required;Need further instruction       PT Short Term Goals - 11/28/18 2355      PT SHORT TERM GOAL #1   Title  Patient will demonstrate minimal to no episodes of pain with supine and seated interventions/exercises to improve strength.    Baseline  3/6: patient reports  pain, does not quantify    Time  2    Period  Weeks    Status  New    Target Date  12/12/18      PT SHORT TERM GOAL #2   Title  Patient will demonstrate adherance and compliance to initial HEP to improve level of function and strength.      Baseline  3/6: HEP given    Time  2    Period  Weeks    Status  New    Target Date  12/12/18        PT Long Term Goals - 11/28/18 0925      PT LONG TERM GOAL #1    Title  Patient will report no R knee pain with stairs to improve strength, LOF, and QOL.    Baseline  3/6: patient indicates knee pain with movement, does not quantify    Time  8    Period  Weeks    Status  New    Target Date  01/23/19      PT LONG TERM GOAL #2   Title  Patient will perform with 0 rest breaks and will improve score to at least 1000 feet to demonstrate decreased pain and improved LOF and QOL.    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    Baseline  3/6: 21/80    Time  8    Period  Weeks    Status  New    Target Date  01/23/19      PT LONG TERM GOAL #4   Title  Patient will report 2 or fewer episodes of knee buckling/week to demonstrate improved strength, balance, and LOF, and to decrease risk of falls.    Baseline  3/6: currently reports 2-3x/day    Time  8    Period  Weeks    Status  New    Target Date  01/23/19      PT LONG TERM GOAL #5   Title  Patient will initiate return to running program without increased pain to demonstrate improved strength, LOF, and QOL.    Baseline  3/6: unable to run due to pain    Time  8    Period  Weeks    Status  New    Target Date  01/23/19             Plan - 11/28/18 1047    Clinical Impression Statement  The patient is a pleasant 38 year old female that presents to PT with general muscle weakness and chronic R knee pain that began about 1 year ago during a half marathon. The patient presents with impairments that include: varying pain levels of multiple locations/sites that increase with movement/activity, minimally decreased R knee extension AROM/PROM, global BLE weakness (RLE weaker than LLE), tenderness to palpation of  multiple landmarks of R knee, decreased muscle flexibility of bilateral hip flexors, hamstrings, and IT bands, impaired standing and sitting posture, impaired gait speed and mechanics, impaired mechanics with stair negotiation, and impaired transfers and bed mobility due  to pain. 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 The patient will benefit from skilled PT in order to decrease pain, improve strength and balance, to improve mechanics/form/technique with gait, transfers and other forms of mobility, and to improve the patient's overall level of function and quality of life.     Personal Factors and Comorbidities  Time since onset of injury/illness/exacerbation;Other;Comorbidity 3+   body habitus, multiple pain sites   Comorbidities  athsma, conversion disorder, Bipolar 1 disorder, anemia, Insomnia    Examination-Activity Limitations  Bed Mobility;Bend;Carry;Dressing;Sit;Transfers;Stand;Stairs;Squat;Lift;Locomotion Level    Examination-Participation Restrictions  Laundry;Cleaning;Shop;Community Activity;Other;Pincus Badder Work   going to gym, farm work, Administrator, arts  Evolving/Moderate complexity    Clinical Decision Making  Moderate    Rehab Potential  Fair    PT Frequency  1x / week    PT Duration  8 weeks    PT Treatment/Interventions  ADLs/Self Care Home Management;Aquatic Therapy;Biofeedback;Cryotherapy;Electrical Stimulation;Iontophoresis /ml Dexamethasone;Moist Heat;Traction;Ultrasound;DME Instruction;Gait training;Stair training;Functional mobility training;Therapeutic activities;Therapeutic exercise;Balance training;Neuromuscular re-education;Manual techniques;Orthotic Fit/Training;Patient/family education;Compression bandaging;Scar  mobilization;Passive range of motion;Dry needling;Taping;Splinting;Energy conservation    PT Next Visit Plan  review and progress HEP; BLE strengthening    PT Home Exercise Plan  see instructions section    Consulted and Agree with Plan of Care  Patient       Patient will benefit from skilled therapeutic intervention in order to improve the following deficits and impairments:  Abnormal gait, Cardiopulmonary status limiting activity, Decreased activity tolerance, Decreased balance, Decreased endurance, Decreased coordination, Decreased knowledge of use of DME, Decreased mobility, Decreased range of motion, Difficulty walking, Decreased strength, Decreased scar mobility, Hypomobility, Increased edema, Impaired flexibility, Impaired perceived functional ability, Increased muscle spasms, Increased fascial restricitons, Postural dysfunction, Improper body mechanics, Pain, Obesity, Hypermobility  Visit Diagnosis: Muscle weakness (generalized) - Plan: PT plan of care cert/re-cert  Chronic pain of right knee - Plan: PT plan of care cert/re-cert  Other abnormalities of gait and mobility - Plan: PT plan of care cert/re-cert     Problem List Patient Active Problem List   Diagnosis Date Noted  . Nausea 07/31/2018  . Surgical menopause, symptomatic 02/12/2018  . Pelvic adhesions 01/20/2018  . Onychomycosis 07/08/2017  . BMI 38.0-38.9,adult 07/03/2016  . Status post laparoscopic hysterectomy 06/20/2015  . Conversion disorder 04/14/2015  . Insomnia 04/14/2015  . Asthma 04/14/2015  . Bipolar disorder (HCC) 04/14/2015  . Vitamin B12 deficiency 04/14/2015  . Vitamin D deficiency 04/14/2015  . Recurrent depression (HCC) 04/14/2015  . Migraine 04/14/2015  . Sleep apnea 04/14/2015  . 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 I have  personally read, edited and approve of the note as written.  Precious Bard, PT, DPT   11/28/2018, 12:09 PM  [facility] MAIN [facility] SERVICES [address] [place], [place], [address] Phone: [phone]   Fax:  [phone]  Name: SHAR PAEZ MRN: [id number] Date of Birth: [birth year]

## 2018-12-02 ENCOUNTER — Ambulatory Visit: Payer: Commercial Managed Care - PPO

## 2018-12-02 DIAGNOSIS — M6281 Muscle weakness (generalized): Secondary | ICD-10-CM

## 2018-12-02 DIAGNOSIS — G8929 Other chronic pain: Secondary | ICD-10-CM

## 2018-12-02 DIAGNOSIS — R2689 Other abnormalities of gait and mobility: Secondary | ICD-10-CM

## 2018-12-02 DIAGNOSIS — M25561 Pain in right knee: Secondary | ICD-10-CM

## 2018-12-02 NOTE — Therapy (Signed)
[facility] MAIN [facility] [address] [place], [place], [address] Phone: [phone]   Fax:  [phone]  Physical Therapy Treatment  Patient Details  Name: Emily [name] MRN: [id number] Date of Birth: March 03, 1981 Referring Provider (PT): Dr. Marguarite ArbourKajal Zalavadia   Encounter Date: 12/02/2018  PT End of Session - 12/02/18 0828    Visit Number  2    Number of Visits  8    Date for PT Re-Evaluation  01/23/19    Authorization Type  2/10 start 11/28/2018    PT Start Time  0759    PT Stop Time  0844    PT Time Calculation (min)  45 min    Equipment Utilized During Treatment  Gait belt    Activity Tolerance  Patient limited by pain;Patient tolerated treatment well    Behavior During Therapy  [facility] for tasks assessed/performed       Past Medical History:  Diagnosis Date  . Anemia   . Asthma    sports induced inhalers  . Bipolar 1 disorder (HCC)   . Conversion disorder   . Insomnia   . Migraines    migraines  . MRSA infection 2010  . Ovarian cyst    left 2.5cm  . Sleep apnea    does not use a CPAP  . Vitamin B 12 deficiency   . Vitamin D deficiency     Past Surgical History:  Procedure Laterality Date  . ABDOMINAL HYSTERECTOMY    . BUNIONECTOMY    . ESOPHAGOGASTRODUODENOSCOPY (EGD) WITH PROPOFOL N/A 11/03/2018   Procedure: ESOPHAGOGASTRODUODENOSCOPY (EGD) WITH PROPOFOL;  Surgeon: Wyline MoodAnna, Kiran, MD;  [facility];  Service: Gastroenterology;  Laterality: N/A;  . HAMMER TOE SURGERY    . HAND SURGERY  2014   cyst removed  . INTRAUTERINE DEVICE (IUD) INSERTION N/A 05/09/2015   Procedure: INTRAUTERINE DEVICE (IUD) INSERTION;  Surgeon: Hildred LaserAnika Cherry, MD;  Location: [facility] ORS;  Service: Gynecology;  Laterality: N/A;  . LAPAROSCOPIC ASSISTED VAGINAL HYSTERECTOMY N/A 06/20/2015   Procedure: LAPAROSCOPIC ASSISTED VAGINAL HYSTERECTOMY, BILATERAL SALPINGECTOMY, CYSTOSCOPY, IUD REMOVAL ;  Surgeon: Hildred LaserAnika Cherry, MD;  Location: [facility] ORS;   Service: Gynecology;  Laterality: N/A;  . LAPAROSCOPIC TUBAL LIGATION Bilateral 05/09/2015   Procedure: LAPAROSCOPIC TUBAL LIGATION;  Surgeon: Hildred LaserAnika Cherry, MD;  Location: [facility] ORS;  Service: Gynecology;  Laterality: Bilateral;  . NASAL SINUS SURGERY    . TUBAL LIGATION  2007    There were no vitals filed for this visit.  Subjective Assessment - 12/02/18 0815    Subjective  Patient reports her knee has started hurting more as the block wears off. Back pain 4/10. Knee 4/10 pain.     Pertinent History  The patient presents to PT with generalized muscle weakness and chronic R knee pain that began approximately 1 year ago when she was running a half marathon. Patient notes that when she was running, she felt 2 big 'pops' in her knee, and was unable to finish the race due to high pain levels. However, she reports 0/10 R knee pain today due to a recent nerve block, which she states is beginning to wear off. However, patient continues to experience her knee popping 7-10x/day and knee buckling/giving out on her 2-3x/day which causes her to lose her balance. Patient denies falls, but reports that she has 4-5 'near falls'/day due to her pain and feeling of her knee giving out. Patient notes that she used to use a SPC due to pain and balance issues,  but has not as much recently due to the nerve block. Patient notes that she has increased pain and difficulty with squatting, stairs, prolonged walking, running, balance, and walking on uneven ground. Patient has had previous MRIs for her R knee, which she says all came back negative/clear. 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 Patient also reports a recent exacerbation of her chronic low back pain that began 2 weeks ago.     Limitations  Sitting;Lifting;Standing;Walking;House hold activities;Other (comment)   farm work, running    How long can you sit comfortably?  1 hour    How long can you stand comfortably?  1 hour    How long can you walk comfortably?  half mile    Diagnostic tests  MRI (no findings/negative)    Patient Stated Goals  stairs, be able to go on cruise without losing balance, return to running    Currently in Pain?  Yes    Pain Score  4     Pain Location  Back    Pain Orientation  Mid;Lower    Pain Descriptors / Indicators  Aching    Pain Type  Chronic pain    Pain Onset  1 to 4 weeks ago   chronic with exacerbation 1-4 weeks ago   Multiple Pain Sites  Yes    Pain Score  4    Pain Location  Knee    Pain Orientation  Right    Pain Descriptors / Indicators  Aching    Pain Type  Chronic pain    Pain Onset  More than a month ago    Pain Frequency  Constant          Patient reports her knee has started hurting more as the block wears off. Back pain 4/10. Knee 4/10 pain.   Supine:  Hamstring stretch each LE 60 seconds.  Single knee to chest 60 seconds for back pain relief, more tender on LLE Dorsiflexion stretch 60 seconds each LE, painful each LE Adduction ball squeeze 12x 5 second holds GTB adduction 15x PT holding opposite end of band.  Roller to calf musculature 2 minutes each LE, painful   3lb ankle weight  Short arc quad, focus on keeping foot in neutral alignment 12x 5 second holds.   Marches with TrA activation 10x each LE, fatiguing to core prior to LE's   Seated: Intrinsic toe musculature strengthening with marble pickup 4x5 marble transfer each LE.    Patient requires verbal and tactile cueing for neutral body alignment and positioning for correct muscle recruitment.                PT Education - 12/02/18 0828    Education Details  HEP compliance, core and LE stability/ muscle recruitment    Person(s) Educated  Patient    Methods  Explanation;Demonstration;Tactile cues;Verbal cues    Comprehension  Verbalized understanding;Returned demonstration;Verbal  cues required;Tactile cues required;Need further instruction       PT Short Term Goals - 11/28/18 9476      PT SHORT TERM GOAL #1   Title  Patient will demonstrate minimal to no episodes of pain with supine and seated interventions/exercises to improve strength.    Baseline  3/6: patient reports pain, does not quantify    Time  2    Period  Weeks    Status  New  Target Date  12/12/18      PT SHORT TERM GOAL #2   Title  Patient will demonstrate adherance and compliance to initial HEP to improve level of function and strength.      Baseline  3/6: HEP given    Time  2    Period  Weeks    Status  New    Target Date  12/12/18        PT Long Term Goals - 11/28/18 0925      PT LONG TERM GOAL #1   Title  Patient will report no R knee pain with stairs to improve strength, LOF, and QOL.    Baseline  3/6: patient indicates knee pain with movement, does not quantify    Time  8    Period  Weeks    Status  New    Target Date  01/23/19      PT LONG TERM GOAL #2   Title  Patient will perform with 0 rest breaks and will improve score to at least 1000 feet to demonstrate decreased pain and improved LOF and QOL.    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    Baseline  3/6: 21/80    Time  8    Period  Weeks    Status  New    Target Date  01/23/19      PT LONG TERM GOAL #4   Title  Patient will report 2 or fewer episodes of knee buckling/week to demonstrate improved strength, balance, and LOF, and to decrease risk of falls.    Baseline  3/6: currently reports 2-3x/day    Time  8    Period  Weeks    Status  New    Target Date  01/23/19      PT LONG TERM GOAL #5   Title  Patient will  initiate return to running program without increased pain to demonstrate improved strength, LOF, and QOL.    Baseline  3/6: unable to run due to pain    Time  8    Period  Weeks    Status  New    Target Date  01/23/19            Plan - 12/02/18 6045    Clinical Impression Statement  Patient presents with good motivation and compliance with HEP. Due to back pain continuing to be present majority of interventions performed in supine with heat pad on back. Patient has noted trigger point musculature of calf bilaterally. Patient limited with bilateral hamstring length resulting in low back pain towards end limit. Poor coordination and foot intrinsic musculature challenged by marble transfer. The patient will benefit from skilled PT in order to decrease pain, improve strength and balance, to improve mechanics/form/technique with gait, transfers and other forms of mobility, and to improve the patient's overall level of function and quality of life.     Personal Factors and Comorbidities  Time since onset of injury/illness/exacerbation;Other;Comorbidity 3+   body habitus, multiple pain sites   Comorbidities  athsma, conversion disorder, Bipolar  1 disorder, anemia, Insomnia    Examination-Activity Limitations  Bed Mobility;Bend;Carry;Dressing;Sit;Transfers;Stand;Stairs;Squat;Lift;Locomotion Level    Examination-Participation Restrictions  Laundry;Cleaning;Shop;Community Activity;Other;Pincus Badder Work   going to gym, farm work, Administrator, arts  Evolving/Moderate complexity    Rehab Potential  Fair    PT Frequency  1x / week    PT Duration  8 weeks    PT Treatment/Interventions  ADLs/Self Care Home Management;Aquatic Therapy;Biofeedback;Cryotherapy;Electrical Stimulation;Iontophoresis /ml Dexamethasone;Moist Heat;Traction;Ultrasound;DME Instruction;Gait training;Stair training;Functional mobility training;Therapeutic activities;Therapeutic exercise;Balance  training;Neuromuscular re-education;Manual techniques;Orthotic Fit/Training;Patient/family education;Compression bandaging;Scar mobilization;Passive range of motion;Dry needling;Taping;Splinting;Energy conservation    PT Next Visit Plan  review and progress HEP; BLE strengthening    PT Home Exercise Plan  see instructions section    Consulted and Agree with Plan of Care  Patient       Patient will benefit from skilled therapeutic intervention in order to improve the following deficits and impairments:  Abnormal gait, Cardiopulmonary status limiting activity, Decreased activity tolerance, Decreased balance, Decreased endurance, Decreased coordination, Decreased knowledge of use of DME, Decreased mobility, Decreased range of motion, Difficulty walking, Decreased strength, Decreased scar mobility, Hypomobility, Increased edema, Impaired flexibility, Impaired perceived functional ability, Increased muscle spasms, Increased fascial restricitons, Postural dysfunction, Improper body mechanics, Pain, Obesity, Hypermobility  Visit Diagnosis: Muscle weakness (generalized)  Chronic pain of right knee  Other abnormalities of gait and mobility     Problem List Patient Active Problem List   Diagnosis Date Noted  . Nausea 07/31/2018  . Surgical menopause, symptomatic 02/12/2018  . Pelvic adhesions 01/20/2018  . Onychomycosis 07/08/2017  . BMI 38.0-38.9,adult 07/03/2016  . Status post laparoscopic hysterectomy 06/20/2015  . Conversion disorder 04/14/2015  . Insomnia 04/14/2015  . Asthma 04/14/2015  . Bipolar disorder (HCC) 04/14/2015  . Vitamin B12 deficiency 04/14/2015  . Vitamin D deficiency 04/14/2015  . Recurrent depression (HCC) 04/14/2015  . Migraine 04/14/2015  . Sleep apnea 04/14/2015  . Conversion disorder with sensory symptom or deficit 07/27/2013   Precious Bard, PT, DPT   12/02/2018, 8:54 AM  [place] [facility] MAIN [facility] SERVICES [address]  [place], [place], [address] Phone: [phone]   Fax:  [phone]  Name: Emily [name] MRN: [id number] Date of Birth: 05-Apr-1981

## 2018-12-04 ENCOUNTER — Encounter: Payer: Commercial Managed Care - PPO | Admitting: Physical Therapy

## 2018-12-09 ENCOUNTER — Telehealth: Payer: Commercial Managed Care - PPO | Admitting: Nurse Practitioner

## 2018-12-09 ENCOUNTER — Other Ambulatory Visit: Payer: Self-pay | Admitting: Family Medicine

## 2018-12-09 ENCOUNTER — Ambulatory Visit: Payer: Commercial Managed Care - PPO

## 2018-12-09 ENCOUNTER — Other Ambulatory Visit: Payer: Self-pay | Admitting: Unknown Physician Specialty

## 2018-12-09 DIAGNOSIS — J029 Acute pharyngitis, unspecified: Secondary | ICD-10-CM

## 2018-12-09 NOTE — Progress Notes (Signed)
You stated in your questionaire that you had only been sick for 2 days. You do not meet criteria for antibiotic treatment at this time. Providers prescribe antibiotics to treat infections caused by bacteria. Antibiotics are very powerful in treating bacterial infections when they are used properly. To maintain their effectiveness, they should be used only when necessary. Overuse of antibiotics has resulted in the development of superbugs that are resistant to treatment!   After careful review of your answers, I would not recommend an antibiotic for your condition. Antibiotics are not effective against viruses and therefore should not be used to treat them. Common examples of infections caused by viruses include colds and flu

## 2018-12-09 NOTE — Progress Notes (Signed)
We are sorry that you are not feeling well.  Here is how we plan to help!  Your symptoms indicate a likely viral infection (Pharyngitis).   Pharyngitis is inflammation in the back of the throat which can cause a sore throat, scratchiness and sometimes difficulty swallowing.   Pharyngitis is typically caused by a respiratory virus and will just run its course.  Please keep in mind that your symptoms could last up to 10 days.  For throat pain, we recommend over the counter oral pain relief medications such as acetaminophen or aspirin, or anti-inflammatory medications such as ibuprofen or naproxen sodium.  Topical treatments such as oral throat lozenges or sprays may be used as needed.  Avoid close contact with loved ones, especially the very young and elderly.  Remember to wash your hands thoroughly throughout the day as this is the number one way to prevent the spread of infection and wipe down door knobs and counters with disinfectant.  After careful review of your answers, I would not recommend and antibiotic for your condition.  Antibiotics should not be used to treat conditions that we suspect are caused by viruses like the virus that causes the common cold or flu. However, some people can have Strep with atypical symptoms. You may need formal testing in clinic or office to confirm if your symptoms continue or worsen.  Providers prescribe antibiotics to treat infections caused by bacteria. Antibiotics are very powerful in treating bacterial infections when they are used properly.  To maintain their effectiveness, they should be used only when necessary.  Overuse of antibiotics has resulted in the development of super bugs that are resistant to treatment!    Home Care:  Only take medications as instructed by your medical team.  Do not drink alcohol while taking these medications.  A steam or ultrasonic humidifier can help congestion.  You can place a towel over your head and breathe in the steam from  hot water coming from a faucet.  Avoid close contacts especially the very young and the elderly.  Cover your mouth when you cough or sneeze.  Always remember to wash your hands.  Get Help Right Away If:  You develop worsening fever or throat pain.  You develop a severe head ache or visual changes.  Your symptoms persist after you have completed your treatment plan.  Make sure you  Understand these instructions.  Will watch your condition.  Will get help right away if you are not doing well or get worse.  Your e-visit answers were reviewed by a board certified advanced clinical practitioner to complete your personal care plan.  Depending on the condition, your plan could have included both over the counter or prescription medications.  If there is a problem please reply  once you have received a response from your provider.  Your safety is important to us.  If you have drug allergies check your prescription carefully.    You can use MyChart to ask questions about todays visit, request a non-urgent call back, or ask for a work or school excuse for 24 hours related to this e-Visit. If it has been greater than 24 hours you will need to follow up with your provider, or enter a new e-Visit to address those concerns.  You will get an e-mail in the next two days asking about your experience.  I hope that your e-visit has been valuable and will speed your recovery. Thank you for using e-visits.   5 minutes spent reviewing   and documenting in chart.  

## 2018-12-10 MED ORDER — ALBUTEROL SULFATE HFA 108 (90 BASE) MCG/ACT IN AERS: 2.0000 | INHALATION_SPRAY | Freq: Four times a day (QID) | RESPIRATORY_TRACT | 0 refills | Status: DC | PRN

## 2018-12-15 NOTE — Therapy (Signed)
[facility] MAIN [facility] SERVICES [address] [place], [place], [address] Phone: [phone]   Fax:  [phone]  Patient Details  Name: OMA KORNBLUTH MRN: [id number] Date of Birth: Jul 27, 1981 Referring Provider:  No ref. provider found  Encounter Date: 12/15/2018  Patient called by PT to ensure patient is doing well, does not have questions, and review HEP. Called due to current outpatient closure for COVID- 19. Patient did not pick up PT call. PT left voicemail stating reason for call and number for call back for further questions/concerns.   Precious Bard, PT, DPT   12/15/2018, 11:22 AM  [place] [facility] MAIN [facility] SERVICES [address] [place], [place], [address] Phone: [phone]   Fax:  [phone]

## 2018-12-16 ENCOUNTER — Ambulatory Visit: Payer: Commercial Managed Care - PPO

## 2019-01-05 NOTE — Therapy (Signed)
[place] [facility] MAIN [facility] SERVICES [address] [place], [place], [address] Phone: [phone]   Fax:  [phone]  Patient Details  Name: Emily [name] MRN: [id number] Date of Birth: [birth year] Referring Provider:  No ref. provider found  Encounter Date: 01/05/2019   The [facility] outpatient clinics are closed at this time due to the COVID-19 epidemic. The patient was contacted in regards to their therapy services. The patient was offered telehealth servies and expresses an interest however states that she has poor internet connectivity at home and would be unable to utilize this service. She denies any questions regarding her home exercise program. The patient is in agreement that she is safe and consents to being on hold for therapy services until the [facility] outpatient facilities reopen. At that time the patient will be contacted to schedule an appointment to resume therapy services.     Lynnea Maizes PT, DPT, GCS  Huprich,Jason 01/05/2019, 4:34 PM  [place] [facility] MAIN [facility] SERVICES [address] [place], [place], [address] Phone: [phone]   Fax:  [phone]

## 2019-02-17 ENCOUNTER — Other Ambulatory Visit: Payer: Self-pay

## 2019-02-17 ENCOUNTER — Encounter: Payer: Self-pay | Admitting: Family Medicine

## 2019-02-17 ENCOUNTER — Ambulatory Visit (INDEPENDENT_AMBULATORY_CARE_PROVIDER_SITE_OTHER): Payer: Commercial Managed Care - PPO | Admitting: Family Medicine

## 2019-02-17 DIAGNOSIS — F449 Dissociative and conversion disorder, unspecified: Secondary | ICD-10-CM

## 2019-02-17 DIAGNOSIS — J4521 Mild intermittent asthma with (acute) exacerbation: Secondary | ICD-10-CM | POA: Diagnosis not present

## 2019-02-17 DIAGNOSIS — F3170 Bipolar disorder, currently in remission, most recent episode unspecified: Secondary | ICD-10-CM | POA: Diagnosis not present

## 2019-02-17 DIAGNOSIS — G43909 Migraine, unspecified, not intractable, without status migrainosus: Secondary | ICD-10-CM | POA: Diagnosis not present

## 2019-02-17 NOTE — Assessment & Plan Note (Signed)
The current medical regimen is effective;  continue present plan and medications.  

## 2019-02-17 NOTE — Progress Notes (Signed)
LMP 06/01/2015 (Within Days)    Subjective:    Patient ID: Emily BeamEmily N [name], female    DOB: Feb 12, 1981, 38 y.o.   MRN: [id number]  HPI: Emily [name] is a 38 y.o. female  Med check  Telemedicine using audio/video telecommunications for a synchronous communication visit. Today's visit due to COVID-19 isolation precautions I connected with and verified that I am speaking with the correct person using two identifiers.   I discussed the limitations, risks, security and privacy concerns of performing an evaluation and management service by telecommunication and the availability of in person appointments. I also discussed with the patient that there may be a patient responsible charge related to this service. The patient expressed understanding and agreed to proceed. The patient's location is home. I am at home.   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 Uses rare albuterol inhaler.  Depression stable on medications.  Right arm remains numb without problems still able to go through daily activities without problems.  Relevant past medical, surgical, family and social history reviewed and updated as indicated. Interim medical history since our last visit reviewed. Allergies and medications reviewed and updated.  Review of Systems  Constitutional: Negative.   Respiratory: Negative.   Cardiovascular: Negative.     Per HPI unless specifically indicated above     Objective:    LMP 06/01/2015 (Within Days)   Wt Readings from Last 3 Encounters:  11/19/18 256 lb (116.1 kg)  11/03/18 252 lb (114.3 kg)  10/15/18 254 lb (115.2 kg)    Physical Exam  Results for orders placed or performed during the hospital encounter of 11/03/18  Surgical pathology  Result Value Ref Range   SURGICAL PATHOLOGY      Surgical Pathology CASE: [id number] PATIENT: Emily [name] Surgical Pathology Report      SPECIMEN SUBMITTED: A. Duodenum, 3rd portion, r/o celiac disease; cbx B. Duodenum bulb, r/o celiac disease; cbx  CLINICAL HISTORY: None provided  PRE-OPERATIVE DIAGNOSIS: Abdominal pain R10.13, Nausea R11.0  POST-OPERATIVE DIAGNOSIS: None provided.     DIAGNOSIS: A. DUODENUM, THIRD PORTION; COLD BIOPSY: - ENTERIC MUCOSA WITH PRESERVED VILLOUS ARCHITECTURE AND NO INTRAEPITHELIAL LYMPHOCYTOSIS. - NEGATIVE FOR FEATURES OF CELIAC, DYSPLASIA, AND MALIGNANCY.  B. DUODENAL BULB; COLD BIOPSY: - ENTERIC MUCOSA WITH MILDLY BLUNTED VILLOUS ARCHITECTURE, BUT NO SIGNIFICANT INTRAEPITHELIAL LYMPHOCYTOSIS. - NEGATIVE FOR DEFINITIVE FEATURES OF CELIAC, DYSPLASIA, AND MALIGNANCY.  Note: The presence of a positive antigliadin antibody test is noted. These biopsies show predominantly well preserved villous architecture, without an increase in intraepithelial lymphocytes. This wou ld be classified as Marsh type 0. There are no histologic features of celiac disease.   GROSS DESCRIPTION: A. Labeled: C BX third portion of duodenum rule out celiac disease Received: Formalin Tissue fragment(s): Several Size: Aggregate, 0.5 x 0.5 x 0.1 cm Description: Tan soft tissue fragments Entirely submitted in A1.  B. Labeled: C BX duodenal bulb rule out celiac disease Received: Formalin Tissue fragment(s): Several Size: Aggregate, 0.4 x 0.4 x 0.1 cm Description: Tan soft tissue fragments Entirely submitted in B1.   Final Diagnosis performed by Katherine MantleHeath Jones, MD.   Electronically signed 11/04/2018 2:15:46PM The electronic signature indicates that the named Attending Pathologist has evaluated the specimen  Technical component performed at [facility], [address], [place], [place] [address] Lab: [phone] Dir: Jolene SchimkeSanjai Nagendra, MD, MMM  Professional component performed at [facility], [facility], [address], [place], [place] [address] Lab : [phone] Dir:  Georgiann Cocker Oneita Kras, MD        Assessment & Plan:   Problem List Items Addressed This Visit      Cardiovascular and Mediastinum   Migraine    The current medical regimen is effective;  continue present plan and medications.         Respiratory   Asthma    The current medical regimen is effective;  continue present plan and medications.         Other   Conversion disorder    The current medical regimen is effective;  continue present plan and medications.       Bipolar disorder (HCC)    The current medical regimen is effective;  continue present plan and medications.          I discussed the assessment and treatment plan with the patient. The patient was provided an opportunity to ask questions and all were answered. The patient agreed with the plan and demonstrated an understanding of the instructions.   The patient was advised to call back or seek an in-person evaluation if the symptoms worsen or if the condition fails to improve as anticipated.   I provided 21+ minutes of time during this encounter. Follow up plan: Return in about 6 months (around 08/20/2019) for Physical Exam.

## 2019-08-02 ENCOUNTER — Encounter: Payer: Self-pay | Admitting: Nurse Practitioner

## 2019-08-04 ENCOUNTER — Other Ambulatory Visit: Payer: Self-pay

## 2019-08-04 ENCOUNTER — Encounter: Payer: Self-pay | Admitting: Nurse Practitioner

## 2019-08-04 ENCOUNTER — Ambulatory Visit (INDEPENDENT_AMBULATORY_CARE_PROVIDER_SITE_OTHER): Payer: Commercial Managed Care - PPO | Admitting: Nurse Practitioner

## 2019-08-04 VITALS — BP 130/84 | HR 76 | Temp 98.8°F | Ht 67.5 in | Wt 259.0 lb

## 2019-08-04 DIAGNOSIS — F3170 Bipolar disorder, currently in remission, most recent episode unspecified: Secondary | ICD-10-CM

## 2019-08-04 DIAGNOSIS — F5101 Primary insomnia: Secondary | ICD-10-CM

## 2019-08-04 DIAGNOSIS — G43909 Migraine, unspecified, not intractable, without status migrainosus: Secondary | ICD-10-CM

## 2019-08-04 DIAGNOSIS — J4521 Mild intermittent asthma with (acute) exacerbation: Secondary | ICD-10-CM

## 2019-08-04 DIAGNOSIS — E538 Deficiency of other specified B group vitamins: Secondary | ICD-10-CM

## 2019-08-04 DIAGNOSIS — K580 Irritable bowel syndrome with diarrhea: Secondary | ICD-10-CM

## 2019-08-04 DIAGNOSIS — Z23 Encounter for immunization: Secondary | ICD-10-CM | POA: Diagnosis not present

## 2019-08-04 DIAGNOSIS — E6609 Other obesity due to excess calories: Secondary | ICD-10-CM

## 2019-08-04 DIAGNOSIS — Z6839 Body mass index (BMI) 39.0-39.9, adult: Secondary | ICD-10-CM

## 2019-08-04 DIAGNOSIS — G4730 Sleep apnea, unspecified: Secondary | ICD-10-CM

## 2019-08-04 DIAGNOSIS — E559 Vitamin D deficiency, unspecified: Secondary | ICD-10-CM

## 2019-08-04 DIAGNOSIS — Z0000 Encounter for general adult medical examination without abnormal findings: Secondary | ICD-10-CM

## 2019-08-04 DIAGNOSIS — E66812 Obesity, class 2: Secondary | ICD-10-CM

## 2019-08-04 MED ORDER — BUPROPION HCL ER (XL) 300 MG PO TB24: 300.0000 mg | ORAL_TABLET | Freq: Every day | ORAL | 3 refills | Status: DC

## 2019-08-04 NOTE — Progress Notes (Signed)
BP 130/84   Pulse 76   Temp 98.8 F (37.1 C) (Oral)   Ht 5' 7.5" (1.715 m)   Wt 259 lb (117.5 kg)   LMP 06/01/2015 (Within Days)   SpO2 97%   BMI 39.97 kg/m    Subjective:    Patient ID: Emily [name], female    DOB: 06-25-1981, 38 y.o.   MRN: [id number]  HPI: SHABREE TEBBETTS is a 38 y.o. female presenting on 08/04/2019 for comprehensive medical examination. Current medical complaints include:none  She currently lives with: husband Menopausal Symptoms: no  BIPOLAR DISORDER Continues on Wellbutrin, takes about 90% of the time, occasionally forgets.  Reports good control with this.  Does endorse come insomnia, does not like lasting effects of sleep agents so prefers not to take them. Mood status: stable Satisfied with current treatment?: yes Symptom severity: moderate  Duration of current treatment : chronic Side effects: no Medication compliance: good compliance Psychotherapy/counseling: none Previous psychiatric medications: tried multiple medications in past Depressed mood: no Anxious mood: no Anhedonia: no Significant weight loss or gain: yes Insomnia: yes hard to fall asleep Fatigue: yes Feelings of worthlessness or guilt: no Impaired concentration/indecisiveness: no Suicidal ideations: no Hopelessness: no Crying spells: no Depression screen [facility] 2/9 07/31/2018 01/28/2018 07/08/2017 07/03/2016  Decreased Interest 0 0 0 0  Down, Depressed, Hopeless 0 0 0 0  PHQ - 2 Score 0 0 0 0  Altered sleeping 3 - - 1  Tired, decreased energy 3 - - 3  Change in appetite 0 - - 1  Feeling bad or failure about yourself  0 - - 0  Trouble concentrating 0 - - 0  Moving slowly or fidgety/restless 0 - - 3  Suicidal thoughts 0 - - 0  PHQ-9 Score 6 - - 8  Difficult doing work/chores Not difficult at all - - -   SLEEP APNEA Has history of sleep study and reports CPAP machine made her sick, reports when she lost weight OSA improved.  Has gained 30 pounds in past year, but reports no  episodes compared to what she used to. Last sleep study: about 4-5 years ago per patient Treatments attempted: CPAP made her sick, used for one year Wakes feeling refreshed:  yes Daytime hypersomnolence:  no Fatigue:  yes Insomnia:  yes Good sleep hygiene:  yes Difficulty falling asleep:  yes Difficulty staying asleep:  yes Snoring bothers bed partner:  no Observed apnea by bed partner: no Obesity:  yes Hypertension: no  Pulmonary hypertension:  no Coronary artery disease:  no  MIGRAINES: Reports these are stable and does not take any maintenance medication.  Uses occasional Ibuprofen which helps.    VITAMIN D AND B12 DEFICIENCY: Has history of low levels in past, at one time took supplements including shots.  Has not taken in awhile and reports noticing numbness to fingers and feet.    ASTHMA Reports getting bronchitis at this time every year.  Was given nebulizer by [facility] in past and uses this.  Does use Flonase.  Noting using OTC antihistamine, as they make her feel bad.  Has also used Singulair, which she did not like. Asthma status: stable Satisfied with current treatment?: yes Albuterol/rescue inhaler frequency: once every week Dyspnea frequency: none Wheezing frequency: none Cough frequency: none Nocturnal symptom frequency: none Limitation of activity: no Current upper respiratory symptoms: no Triggers: season change in autumn Home peak flows: none Last Spirometry: not in a long while Failed/intolerant to following asthma meds: none Asthma  meds in past: Albuterol Aerochamber/spacer use: no Visits to ER or Urgent Care in past year: no Pneumovax: Not up to Date Influenza: Up to Date   Functional Status Survey: Is the patient deaf or have difficulty hearing?: No Does the patient have difficulty seeing, even when wearing glasses/contacts?: No Does the patient have difficulty concentrating, remembering, or making decisions?: No Does the patient have difficulty  walking or climbing stairs?: No Does the patient have difficulty dressing or bathing?: No Does the patient have difficulty doing errands alone such as visiting a doctor's office or shopping?: No  The patient does not have a history of falls. I did not complete a risk assessment for falls. A plan of care for falls was not documented.   Past Medical History:  Past Medical History:  Diagnosis Date  . Anemia   . Asthma    sports induced inhalers  . Bipolar 1 disorder (HCC)   . Conversion disorder   . Insomnia   . Migraines    migraines  . MRSA infection 2010  . Ovarian cyst    left 2.5cm  . Sleep apnea    does not use a CPAP  . Vitamin B 12 deficiency   . Vitamin D deficiency     Surgical History:  Past Surgical History:  Procedure Laterality Date  . ABDOMINAL HYSTERECTOMY    . BUNIONECTOMY    . ESOPHAGOGASTRODUODENOSCOPY (EGD) WITH PROPOFOL N/A 11/03/2018   Procedure: ESOPHAGOGASTRODUODENOSCOPY (EGD) WITH PROPOFOL;  Surgeon: Wyline MoodAnna, Kiran, MD;  [facility];  Service: Gastroenterology;  Laterality: N/A;  . HAMMER TOE SURGERY    . HAND SURGERY  2014   cyst removed  . INTRAUTERINE DEVICE (IUD) INSERTION N/A 05/09/2015   Procedure: INTRAUTERINE DEVICE (IUD) INSERTION;  Surgeon: Hildred LaserAnika Cherry, MD;  Location: [facility] ORS;  Service: Gynecology;  Laterality: N/A;  . LAPAROSCOPIC ASSISTED VAGINAL HYSTERECTOMY N/A 06/20/2015   Procedure: LAPAROSCOPIC ASSISTED VAGINAL HYSTERECTOMY, BILATERAL SALPINGECTOMY, CYSTOSCOPY, IUD REMOVAL ;  Surgeon: Hildred LaserAnika Cherry, MD;  Location: [facility] ORS;  Service: Gynecology;  Laterality: N/A;  . LAPAROSCOPIC TUBAL LIGATION Bilateral 05/09/2015   Procedure: LAPAROSCOPIC TUBAL LIGATION;  Surgeon: Hildred LaserAnika Cherry, MD;  Location: [facility] ORS;  Service: Gynecology;  Laterality: Bilateral;  . NASAL SINUS SURGERY    . TUBAL LIGATION  2007    Medications:  Current Outpatient Medications on File Prior to Visit  Medication Sig  . albuterol (PROAIR HFA) 108 (90 Base)  MCG/ACT inhaler Inhale 2 puffs into the lungs every 6 (six) hours as needed for wheezing or shortness of breath.  . fluticasone (FLONASE) 50 MCG/ACT nasal spray Place 2 sprays into both nostrils daily as needed for allergies or rhinitis.  Marland Kitchen. ibuprofen (ADVIL,MOTRIN) 800 MG tablet Take 1 tablet (800 mg total) by mouth every 8 (eight) hours as needed.   No current facility-administered medications on file prior to visit.     Allergies:  Allergies  Allergen Reactions  . Codeine Diarrhea and Nausea And Vomiting    Social History:  Social History   Socioeconomic History  . Marital status: Married    Spouse name: Not on file  . Number of children: Not on file  . Years of education: Not on file  . Highest education level: Not on file  Occupational History  . Not on file  Social Needs  . Financial resource strain: Not on file  . Food insecurity    Worry: Not on file    Inability: Not on file  . Transportation needs  Medical: Not on file    Non-medical: Not on file  Tobacco Use  . Smoking status: Former Smoker    Packs/day: 1.00    Years: 10.00    Pack years: 10.00    Types: Cigarettes    Quit date: 09/25/2003    Years since quitting: 15.8  . Smokeless tobacco: Never Used  Substance and Sexual Activity  . Alcohol use: Yes    Alcohol/week: 0.0 - 1.0 standard drinks    Comment: pt states she is a social drinker  . Drug use: No  . Sexual activity: Yes    Birth control/protection: Surgical  Lifestyle  . Physical activity    Days per week: Not on file    Minutes per session: Not on file  . Stress: Not on file  Relationships  . Social Musician on phone: Not on file    Gets together: Not on file    Attends religious service: Not on file    Active member of club or organization: Not on file    Attends meetings of clubs or organizations: Not on file    Relationship status: Not on file  . Intimate partner violence    Fear of current or ex partner: Not on file     Emotionally abused: Not on file    Physically abused: Not on file    Forced sexual activity: Not on file  Other Topics Concern  . Not on file  Social History Narrative  . Not on file   Social History   Tobacco Use  Smoking Status Former Smoker  . Packs/day: 1.00  . Years: 10.00  . Pack years: 10.00  . Types: Cigarettes  . Quit date: 09/25/2003  . Years since quitting: 15.8  Smokeless Tobacco Never Used   Social History   Substance and Sexual Activity  Alcohol Use Yes  . Alcohol/week: 0.0 - 1.0 standard drinks   Comment: pt states she is a social drinker    Family History:  Family History  Problem Relation Age of Onset  . Bipolar disorder Mother   . Anxiety disorder Mother   . Cancer Mother        brain  . Glaucoma Mother   . Heart disease Father   . Diabetes Father   . Cancer Father   . Bipolar disorder Sister   . Schizophrenia Sister   . Cancer Maternal Grandmother        lung  . Heart disease Maternal Grandfather   . Breast cancer Brother   . Ovarian cancer Neg Hx     Past medical history, surgical history, medications, allergies, family history and social history reviewed with patient today and changes made to appropriate areas of the chart.   Review of Systems - negative All other ROS negative except what is listed above and in the HPI.      Objective:    BP 130/84   Pulse 76   Temp 98.8 F (37.1 C) (Oral)   Ht 5' 7.5" (1.715 m)   Wt 259 lb (117.5 kg)   LMP 06/01/2015 (Within Days)   SpO2 97%   BMI 39.97 kg/m   Wt Readings from Last 3 Encounters:  08/04/19 259 lb (117.5 kg)  11/19/18 256 lb (116.1 kg)  11/03/18 252 lb (114.3 kg)    Physical Exam Constitutional:      General: She is awake. She is not in acute distress.    Appearance: She is well-developed. She is not ill-appearing.  HENT:     Head: Normocephalic and atraumatic.     Right Ear: Hearing, tympanic membrane, ear canal and external ear normal. No drainage.     Left Ear:  Hearing, tympanic membrane, ear canal and external ear normal. No drainage.     Nose: Nose normal.     Right Sinus: No maxillary sinus tenderness or frontal sinus tenderness.     Left Sinus: No maxillary sinus tenderness or frontal sinus tenderness.     Mouth/Throat:     Mouth: Mucous membranes are moist.     Pharynx: Oropharynx is clear. Uvula midline. No pharyngeal swelling, oropharyngeal exudate or posterior oropharyngeal erythema.  Eyes:     General: Lids are normal.        Right eye: No discharge.        Left eye: No discharge.     Extraocular Movements: Extraocular movements intact.     Conjunctiva/sclera: Conjunctivae normal.     Pupils: Pupils are equal, round, and reactive to light.     Visual Fields: Right eye visual fields normal and left eye visual fields normal.  Neck:     Musculoskeletal: Normal range of motion and neck supple.     Thyroid: No thyromegaly.     Vascular: No carotid bruit.     Trachea: Trachea normal.  Cardiovascular:     Rate and Rhythm: Normal rate and regular rhythm.     Heart sounds: Normal heart sounds. No murmur. No gallop.   Pulmonary:     Effort: Pulmonary effort is normal. No accessory muscle usage or respiratory distress.     Breath sounds: Normal breath sounds.  Chest:     Breasts:        Right: Normal.        Left: Normal.  Abdominal:     General: Bowel sounds are normal.     Palpations: Abdomen is soft. There is no hepatomegaly or splenomegaly.     Tenderness: There is no abdominal tenderness.  Musculoskeletal: Normal range of motion.     Right lower leg: No edema.     Left lower leg: No edema.  Lymphadenopathy:     Head:     Right side of head: No submental, submandibular, tonsillar, preauricular or posterior auricular adenopathy.     Left side of head: No submental, submandibular, tonsillar, preauricular or posterior auricular adenopathy.     Cervical: No cervical adenopathy.     Upper Body:     Right upper body: No  supraclavicular, axillary or pectoral adenopathy.     Left upper body: No supraclavicular, axillary or pectoral adenopathy.  Skin:    General: Skin is warm and dry.     Capillary Refill: Capillary refill takes less than 2 seconds.     Findings: No rash.  Neurological:     Mental Status: She is alert and oriented to person, place, and time.     Cranial Nerves: Cranial nerves are intact.     Gait: Gait is intact.     Deep Tendon Reflexes: Reflexes are normal and symmetric.     Reflex Scores:      Brachioradialis reflexes are 2+ on the right side and 2+ on the left side.      Patellar reflexes are 2+ on the right side and 2+ on the left side. Psychiatric:        Attention and Perception: Attention normal.        Mood and Affect: Mood normal.  Speech: Speech normal.        Behavior: Behavior normal. Behavior is cooperative.        Thought Content: Thought content normal.        Judgment: Judgment normal.     Results for orders placed or performed during the hospital encounter of 11/03/18  Surgical pathology  Result Value Ref Range   SURGICAL PATHOLOGY      Surgical Pathology CASE: [id number] PATIENT: Julie-Anne Weyenberg Surgical Pathology Report     SPECIMEN SUBMITTED: A. Duodenum, 3rd portion, r/o celiac disease; cbx B. Duodenum bulb, r/o celiac disease; cbx  CLINICAL HISTORY: None provided  PRE-OPERATIVE DIAGNOSIS: Abdominal pain R10.13, Nausea R11.0  POST-OPERATIVE DIAGNOSIS: None provided.     DIAGNOSIS: A. DUODENUM, THIRD PORTION; COLD BIOPSY: - ENTERIC MUCOSA WITH PRESERVED VILLOUS ARCHITECTURE AND NO INTRAEPITHELIAL LYMPHOCYTOSIS. - NEGATIVE FOR FEATURES OF CELIAC, DYSPLASIA, AND MALIGNANCY.  B. DUODENAL BULB; COLD BIOPSY: - ENTERIC MUCOSA WITH MILDLY BLUNTED VILLOUS ARCHITECTURE, BUT NO SIGNIFICANT INTRAEPITHELIAL LYMPHOCYTOSIS. - NEGATIVE FOR DEFINITIVE FEATURES OF CELIAC, DYSPLASIA, AND MALIGNANCY.  Note: The presence of a positive antigliadin  antibody test is noted. These biopsies show predominantly well preserved villous architecture, without an increase in intraepithelial lymphocytes. This wou ld be classified as Marsh type 0. There are no histologic features of celiac disease.   GROSS DESCRIPTION: A. Labeled: C BX third portion of duodenum rule out celiac disease Received: Formalin Tissue fragment(s): Several Size: Aggregate, 0.5 x 0.5 x 0.1 cm Description: Tan soft tissue fragments Entirely submitted in A1.  B. Labeled: C BX duodenal bulb rule out celiac disease Received: Formalin Tissue fragment(s): Several Size: Aggregate, 0.4 x 0.4 x 0.1 cm Description: Tan soft tissue fragments Entirely submitted in B1.   Final Diagnosis performed by Katherine Mantle, MD.   Electronically signed 11/04/2018 2:15:46PM The electronic signature indicates that the named Attending Pathologist has evaluated the specimen  Technical component performed at [facility], [address], [place], [place] [address] Lab: [phone] Dir: Tzion Wedel Schimke, MD, MMM  Professional component performed at [facility], [facility], [address] [place], [place], [place] [address] Lab : [phone] Dir: Georgiann Cocker. Rubinas, MD       Assessment & Plan:   Problem List Items Addressed This Visit      Cardiovascular and Mediastinum   Migraine    Chronic, stable without medication and no migraine in several months.      Relevant Medications   buPROPion (WELLBUTRIN XL) 300 MG 24 hr tablet     Respiratory   Asthma    Chronic, stable with Albuterol only.  Continue current medication regimen and adjust as needed. Will plan on spirometry at next visit in 6 months.      Relevant Orders   CBC with Differential/Platelet out   Sleep apnea    Chronic, no CPAP use due to making her sick.  Continue to monitor and adjust plan of care as needed.        Digestive   Irritable bowel syndrome with diarrhea    Followed by GI at this time, last visit in  February.  Discussed that she may benefit from daily probiotic.      Relevant Orders   Comprehensive metabolic panel   TSH     Other   Insomnia    Chronic, stable and does not wish to take medication for this.  Utilizes behavioral techniques.      Bipolar disorder (HCC)    Chronic, stable.  Denies SI/HI.  Continue Wellbutrin, refills sent.  Follow-up in 6 months.      Relevant Medications   buPROPion (WELLBUTRIN XL) 300 MG 24 hr tablet   Vitamin B12 deficiency    Last level 282, recheck level today and recommend she take daily Vitamin B12 1000 MCG.      Relevant Orders   Vitamin B12   Vitamin D deficiency    Last level in 20's.  Will recheck today and if remains <30 will recommend taking daily Vitamin D3 1000 units.      Relevant Orders   Vit D  25 hydroxy (rtn osteoporosis monitoring)   Obesity    Recommend continued focus on health diet choices and regular physical activity (30 minutes 5 days a week). Set small achievable goals.        Other Visit Diagnoses    Annual physical exam    -  Primary   Annual labs to include CBC, CMP, TSH, lipid panel   Relevant Orders   Lipid Panel w/o Chol/HDL Ratio out   TSH   Flu vaccine need       Relevant Orders   Flu Vaccine QUAD 6+ mos PF IM (Fluarix Quad PF) (Completed)       Follow up plan: Return in about 6 months (around 02/01/2020) for Mood and Asthma -- needs spirometry.   LABORATORY TESTING:  - Pap smear: not applicable  IMMUNIZATIONS:   - Tdap: Tetanus vaccination status reviewed: last tetanus booster within 10 years. - Influenza: Up to date - Pneumovax: Not applicable - Prevnar: Not applicable - HPV: Not applicable - Zostavax vaccine: Not applicable  SCREENING: -Mammogram: Not applicable  - Colonoscopy: Not applicable  - Bone Density: Not applicable  -Hearing Test: Not applicable  -Spirometry: Not applicable   PATIENT COUNSELING:   Advised to take 1 mg of folate supplement per day if capable of  pregnancy.   Sexuality: Discussed sexually transmitted diseases, partner selection, use of condoms, avoidance of unintended pregnancy  and contraceptive alternatives.   Advised to avoid cigarette smoking.  I discussed with the patient that most people either abstain from alcohol or drink within safe limits (<=14/week and <=4 drinks/occasion for males, <=7/weeks and <= 3 drinks/occasion for females) and that the risk for alcohol disorders and other health effects rises proportionally with the number of drinks per week and how often a drinker exceeds daily limits.  Discussed cessation/primary prevention of drug use and availability of treatment for abuse.   Diet: Encouraged to adjust caloric intake to maintain  or achieve ideal body weight, to reduce intake of dietary saturated fat and total fat, to limit sodium intake by avoiding high sodium foods and not adding table salt, and to maintain adequate dietary potassium and calcium preferably from fresh fruits, vegetables, and low-fat dairy products.    stressed the importance of regular exercise  Injury prevention: Discussed safety belts, safety helmets, smoke detector, smoking near bedding or upholstery.   Dental health: Discussed importance of regular tooth brushing, flossing, and dental visits.    NEXT PREVENTATIVE PHYSICAL DUE IN 1 YEAR. Return in about 6 months (around 02/01/2020) for Mood and Asthma -- needs spirometry.

## 2019-08-04 NOTE — Assessment & Plan Note (Signed)
Last level in 20's.  Will recheck today and if remains <30 will recommend taking daily Vitamin D3 1000 units.

## 2019-08-04 NOTE — Patient Instructions (Signed)
Vitamin B12 Test and Folate Test  Why am I having this test? Vitamin B12 and folate (folic acid) are both B vitamins that are needed to make red blood cells and to keep your nervous system healthy. Vitamin B12 is found in foods such as meats, eggs, dairy products, and fish. Folate is found in fruits, beans, and leafy green vegetables. These vitamins also get added to some foods, such as grains and cereals. You may have a lack (deficiency) of these B vitamins in your body if you do not get enough of them in your diet. Low levels can also be caused by digestive system diseases that interfere with your ability to absorb the vitamins from your food. The most common cause of a vitamin B12 deficiency is the inability to absorb it (pernicious anemia). You may have a vitamin B12 and folate test if:  You have symptoms of vitamin B12 or folate deficiency, such as fatigue, headache, confusion, poor balance, or tingling and numbness.  You are pregnant or breastfeeding. Women who are pregnant or breastfeeding need more folate and may need to take supplements.  Your red blood cell count is low (anemia).  You are an older person and have mental confusion.  You have a disease or condition that may lead to a deficiency of these B vitamins. What is being tested? This test measures the amount of vitamin B12 and folate in your blood. The tests for vitamin B12 and folate may be done together or separately. What kind of sample is taken?  A blood sample is required for this test. It is usually collected by inserting a needle into a blood vessel. How do I prepare for this test? Follow instructions from your health care provider about eating and drinking before the test. Tell a health care provider about:  All medicines you are taking, including vitamins, herbs, eye drops, creams, and over-the-counter medicines.  Any medical conditions you have.  Whether you are pregnant or may be pregnant.  How often you drink  alcohol. How are the results reported? Your test results will be reported as values that identify the amount of vitamin B12 and folate in your blood. Your health care provider will compare your results to normal ranges that were established after testing a large group of people (reference ranges). Reference ranges may vary among labs and hospitals. For this test, common reference ranges are:  Vitamin B12: 160-950 pg/mL or 118-701 pmol/L (SI units).  Folate: 5-25 ng/mL or 11-57 nmol/L (SI units). What do the results mean? Results within the reference range are considered normal. Vitamin B12 or folate levels that are lower than the reference range may be caused by various conditions, including:  Anemia.  Poor nutrition.  Alcoholism.  Liver disease.  Digestive disease. High levels of vitamin B12 are rare, but they may happen if you have:  Cancer.  Diabetes.  Heart failure.  Obesity.  Liver disease.  Human immunodeficiency virus (HIV). High levels of folate may happen if:  You have pernicious anemia.  You are vegetarian.  You have had a recent blood transfusion. Talk with your health care provider about what your results mean. Questions to ask your health care provider Ask your health care provider, or the department that is doing the test:  When will my results be ready?  How will I get my results?  What are my treatment options?  What other tests do I need?  What are my next steps? Summary  Vitamin B12 and folate (folic   acid) are both B vitamins that are needed to make red blood cells and to keep your nervous system healthy.  You may have a lack (deficiency) of these B vitamins in your body if you do not get enough of them in your diet or if you have a digestive system disease.  This test measures the amount of vitamin B12 and folate in your blood. A blood sample is required for the test.  Talk with your health care provider about what your results mean.  This information is not intended to replace advice given to you by your health care provider. Make sure you discuss any questions you have with your health care provider. Document Released: 10/05/2004 Document Revised: 08/23/2017 Document Reviewed: 05/06/2017 Elsevier Patient Education  2020 [employer] Inc[employer]  

## 2019-08-04 NOTE — Assessment & Plan Note (Signed)
Chronic, stable.  Denies SI/HI.  Continue Wellbutrin, refills sent.  Follow-up in 6 months.

## 2019-08-04 NOTE — Assessment & Plan Note (Signed)
Last level 282, recheck level today and recommend she take daily Vitamin B12 1000 MCG.

## 2019-08-04 NOTE — Assessment & Plan Note (Signed)
Chronic, stable with Albuterol only.  Continue current medication regimen and adjust as needed. Will plan on spirometry at next visit in 6 months.

## 2019-08-04 NOTE — Assessment & Plan Note (Signed)
Chronic, stable without medication and no migraine in several months.

## 2019-08-04 NOTE — Assessment & Plan Note (Signed)
Chronic, stable and does not wish to take medication for this.  Utilizes behavioral techniques.

## 2019-08-04 NOTE — Assessment & Plan Note (Signed)
Followed by GI at this time, last visit in February.  Discussed that she may benefit from daily probiotic.

## 2019-08-04 NOTE — Assessment & Plan Note (Signed)
Chronic, no CPAP use due to making her sick.  Continue to monitor and adjust plan of care as needed.

## 2019-08-04 NOTE — Assessment & Plan Note (Signed)
Recommend continued focus on health diet choices and regular physical activity (30 minutes 5 days a week). Set small achievable goals.

## 2019-08-05 LAB — CBC WITH DIFFERENTIAL/PLATELET
Basophils Absolute: 0.1 10*3/uL (ref 0.0–0.2)
Basos: 1 %
EOS (ABSOLUTE): 0.1 10*3/uL (ref 0.0–0.4)
Eos: 1 %
Hematocrit: 45.3 % (ref 34.0–46.6)
Hemoglobin: 15.1 g/dL (ref 11.1–15.9)
Immature Grans (Abs): 0 10*3/uL (ref 0.0–0.1)
Immature Granulocytes: 0 %
Lymphocytes Absolute: 3.5 10*3/uL — ABNORMAL HIGH (ref 0.7–3.1)
Lymphs: 38 %
MCH: 29.8 pg (ref 26.6–33.0)
MCHC: 33.3 g/dL (ref 31.5–35.7)
MCV: 90 fL (ref 79–97)
Monocytes Absolute: 0.7 10*3/uL (ref 0.1–0.9)
Monocytes: 8 %
Neutrophils Absolute: 4.8 10*3/uL (ref 1.4–7.0)
Neutrophils: 52 %
Platelets: 299 10*3/uL (ref 150–450)
RBC: 5.06 x10E6/uL (ref 3.77–5.28)
RDW: 12.9 % (ref 11.7–15.4)
WBC: 9.2 10*3/uL (ref 3.4–10.8)

## 2019-08-05 LAB — COMPREHENSIVE METABOLIC PANEL
ALT: 21 IU/L (ref 0–32)
AST: 19 IU/L (ref 0–40)
Albumin/Globulin Ratio: 1.4 (ref 1.2–2.2)
Albumin: 4.6 g/dL (ref 3.8–4.8)
Alkaline Phosphatase: 111 IU/L (ref 39–117)
BUN/Creatinine Ratio: 9 (ref 9–23)
BUN: 9 mg/dL (ref 6–20)
Bilirubin Total: 1.2 mg/dL (ref 0.0–1.2)
CO2: 25 mmol/L (ref 20–29)
Calcium: 10.2 mg/dL (ref 8.7–10.2)
Chloride: 101 mmol/L (ref 96–106)
Creatinine, Ser: 1 mg/dL (ref 0.57–1.00)
GFR calc Af Amer: 82 mL/min/{1.73_m2} (ref >59)
GFR calc non Af Amer: 71 mL/min/{1.73_m2} (ref >59)
Globulin, Total: 3.2 g/dL (ref 1.5–4.5)
Glucose: 84 mg/dL (ref 65–99)
Potassium: 4.9 mmol/L (ref 3.5–5.2)
Sodium: 142 mmol/L (ref 134–144)
Total Protein: 7.8 g/dL (ref 6.0–8.5)

## 2019-08-05 LAB — LIPID PANEL W/O CHOL/HDL RATIO
Cholesterol, Total: 181 mg/dL (ref 100–199)
HDL: 42 mg/dL (ref >39)
LDL Chol Calc (NIH): 114 mg/dL — ABNORMAL HIGH (ref 0–99)
Triglycerides: 141 mg/dL (ref 0–149)
VLDL Cholesterol Cal: 25 mg/dL (ref 5–40)

## 2019-08-05 LAB — TSH: TSH: 3.32 u[IU]/mL (ref 0.450–4.500)

## 2019-08-05 LAB — VITAMIN B12: Vitamin B-12: 273 pg/mL (ref 232–1245)

## 2019-08-05 LAB — VITAMIN D 25 HYDROXY (VIT D DEFICIENCY, FRACTURES): Vit D, 25-Hydroxy: 27.7 ng/mL — ABNORMAL LOW (ref 30.0–100.0)

## 2019-11-22 ENCOUNTER — Encounter: Payer: Self-pay | Admitting: Nurse Practitioner

## 2019-11-25 ENCOUNTER — Other Ambulatory Visit: Payer: Self-pay

## 2019-11-25 ENCOUNTER — Encounter: Payer: Self-pay | Admitting: Family Medicine

## 2019-11-25 ENCOUNTER — Ambulatory Visit (INDEPENDENT_AMBULATORY_CARE_PROVIDER_SITE_OTHER): Payer: Commercial Managed Care - PPO | Admitting: Family Medicine

## 2019-11-25 ENCOUNTER — Telehealth: Payer: Self-pay

## 2019-11-25 VITALS — BP 121/82 | HR 71 | Temp 98.4°F | Ht 68.0 in | Wt 263.0 lb

## 2019-11-25 DIAGNOSIS — E559 Vitamin D deficiency, unspecified: Secondary | ICD-10-CM

## 2019-11-25 DIAGNOSIS — E6609 Other obesity due to excess calories: Secondary | ICD-10-CM | POA: Diagnosis not present

## 2019-11-25 DIAGNOSIS — E538 Deficiency of other specified B group vitamins: Secondary | ICD-10-CM | POA: Diagnosis not present

## 2019-11-25 DIAGNOSIS — Z6839 Body mass index (BMI) 39.0-39.9, adult: Secondary | ICD-10-CM

## 2019-11-25 DIAGNOSIS — R200 Anesthesia of skin: Secondary | ICD-10-CM

## 2019-11-25 MED ORDER — CYANOCOBALAMIN 1000 MCG/ML IJ SOLN: 1000.0000 ug | INTRAMUSCULAR | 2 refills | Status: DC

## 2019-11-25 MED ORDER — "NEEDLE (DISP) 18G X 1"" MISC": 1.0000 [IU] | 0 refills | Status: DC

## 2019-11-25 MED ORDER — PEN NEEDLES 32G X 6 MM MISC: 1.0000 [IU] | Freq: Every day | 0 refills | Status: DC

## 2019-11-25 MED ORDER — SAXENDA 18 MG/3ML ~~LOC~~ SOPN: PEN_INJECTOR | SUBCUTANEOUS | 0 refills | Status: DC

## 2019-11-25 NOTE — Telephone Encounter (Signed)
Prior Authorization initiated via CoverMyMeds for Saxenda 18MG /3ML pen-injectors Key: [id number]  Will send PA through once progress notes are completed.

## 2019-11-25 NOTE — Progress Notes (Signed)
BP 121/82   Pulse 71   Temp 98.4 F (36.9 C) (Oral)   Ht _0  (1.727 m)   Wt 263 lb (119.3 kg)   LMP 06/01/2015 (Within Days)   SpO2 99%   BMI 39.99 kg/m    Subjective:    Patient ID: Emily [name], female    DOB: 01/04/1981, 39 y.o.   MRN: [id number]  HPI: Emily [name] is a 39 y.o. female  Chief Complaint  Patient presents with  . Numbness    left arm and bilateral feet on and off for the last couple of months but last 2 weeks symptoms sre getting worse   Presenting today with several months of intermittent left arm and b/l foot numbness and tingling. Has restarted vit D and B12 supplementation the past few months. Has had severe B12 deficiency in the past where she was having to take injections almost daily and this came with numbness and weakness of extremities similar to what she's feeling now. Went to neurology for this in the past which was where she was found to have the deficiency causing these sxs. Denies any mental status changes, falls, recent illness, fevers.   Also very concerned about her weight. States this deficiency in the past was associated with her obesity and once she lost a lot of weight things improved. Feels her weight impedes her ability to be as active as desired as well. Has been on appetite suppressants which she didn't tolerate well previously. Has tried numerous types of diets also without much success.   Relevant past medical, surgical, family and social history reviewed and updated as indicated. Interim medical history since our last visit reviewed. Allergies and medications reviewed and updated.  Review of Systems  Per HPI unless specifically indicated above     Objective:    BP 121/82   Pulse 71   Temp 98.4 F (36.9 C) (Oral)   Ht _1  (1.727 m)   Wt 263 lb (119.3 kg)   LMP 06/01/2015 (Within Days)   SpO2 99%   BMI 39.99 kg/m   Wt Readings from Last 3 Encounters:  11/30/19 263 lb (119.3 kg)  11/27/19 263 lb (119.3 kg)  11/25/19  263 lb (119.3 kg)    Physical Exam Vitals and nursing note reviewed.  Constitutional:      Appearance: Normal appearance. She is not ill-appearing.  HENT:     Head: Atraumatic.  Eyes:     Extraocular Movements: Extraocular movements intact.     Conjunctiva/sclera: Conjunctivae normal.  Cardiovascular:     Rate and Rhythm: Normal rate and regular rhythm.     Heart sounds: Normal heart sounds.  Pulmonary:     Effort: Pulmonary effort is normal.     Breath sounds: Normal breath sounds.  Musculoskeletal:        General: Normal range of motion.     Cervical back: Normal range of motion and neck supple.  Skin:    General: Skin is warm and dry.  Neurological:     Mental Status: She is alert and oriented to person, place, and time.     Sensory: Sensory deficit (per pt light touch sensation altered from baseline) present.     Motor: Weakness (left arm mildly decreased grip strength) present.     Coordination: Coordination normal.  Psychiatric:        Mood and Affect: Mood normal.        Thought Content: Thought content normal.  Judgment: Judgment normal.     Results for orders placed or performed in visit on 11/25/19  Vitamin B12  Result Value Ref Range   Vitamin B-12 693 232 - 1,245 pg/mL  Vitamin D (25 hydroxy)  Result Value Ref Range   Vit D, 25-Hydroxy 53.5 30.0 - 100.0 ng/mL  Sed Rate (ESR)  Result Value Ref Range   Sed Rate 9 0 - 32 mm/hr      Assessment & Plan:   Problem List Items Addressed This Visit      Other   Vitamin B12 deficiency    Restart injections at twice monthly for now, and recheck levels today. Monitor for benefit with sxs      Vitamin D deficiency - Primary    Recheck levels now that taking OTC supplement      Obesity    Start saxenda, continue working on diet and exercise. Counseling given at length today on risks, benefits of medication and lifestyle habits      Relevant Medications   Liraglutide -Weight Management (SAXENDA) 18  MG/3ML SOPN    Other Visit Diagnoses    Extremity numbness       Relevant Orders   Vitamin B12 (Completed)   Vitamin D (25 hydroxy) (Completed)   Sed Rate (ESR) (Completed)       Follow up plan: Return in about 4 weeks (around 12/23/2019) for Weight, numbness f/u.

## 2019-11-26 ENCOUNTER — Encounter: Payer: Self-pay | Admitting: Family Medicine

## 2019-11-26 LAB — SEDIMENTATION RATE: Sed Rate: 9 mm/hr (ref 0–32)

## 2019-11-26 LAB — VITAMIN B12: Vitamin B-12: 693 pg/mL (ref 232–1245)

## 2019-11-26 LAB — VITAMIN D 25 HYDROXY (VIT D DEFICIENCY, FRACTURES): Vit D, 25-Hydroxy: 53.5 ng/mL (ref 30.0–100.0)

## 2019-11-27 ENCOUNTER — Encounter: Payer: Self-pay | Admitting: Emergency Medicine

## 2019-11-27 ENCOUNTER — Emergency Department
Admission: EM | Admit: 2019-11-27 | Discharge: 2019-11-27 | Disposition: A | Discharge status: Home / Self Care | Payer: Commercial Managed Care - PPO | Attending: Student in an Organized Health Care Education/Training Program | Admitting: Student in an Organized Health Care Education/Training Program

## 2019-11-27 ENCOUNTER — Emergency Department: Payer: Commercial Managed Care - PPO

## 2019-11-27 ENCOUNTER — Other Ambulatory Visit: Payer: Self-pay

## 2019-11-27 DIAGNOSIS — Z79899 Other long term (current) drug therapy: Secondary | ICD-10-CM | POA: Diagnosis not present

## 2019-11-27 DIAGNOSIS — R1031 Right lower quadrant pain: Secondary | ICD-10-CM | POA: Diagnosis present

## 2019-11-27 DIAGNOSIS — J45909 Unspecified asthma, uncomplicated: Secondary | ICD-10-CM | POA: Insufficient documentation

## 2019-11-27 DIAGNOSIS — Z87891 Personal history of nicotine dependence: Secondary | ICD-10-CM | POA: Diagnosis not present

## 2019-11-27 DIAGNOSIS — Z7984 Long term (current) use of oral hypoglycemic drugs: Secondary | ICD-10-CM | POA: Insufficient documentation

## 2019-11-27 DIAGNOSIS — N200 Calculus of kidney: Secondary | ICD-10-CM | POA: Diagnosis not present

## 2019-11-27 LAB — URINALYSIS, COMPLETE (UACMP) WITH MICROSCOPIC
Bilirubin Urine: NEGATIVE
Glucose, UA: NEGATIVE mg/dL
Ketones, ur: NEGATIVE mg/dL
Leukocytes,Ua: NEGATIVE
Nitrite: NEGATIVE
Protein, ur: NEGATIVE mg/dL
RBC / HPF: >50 RBC/hpf — ABNORMAL HIGH (ref 0–5)
Specific Gravity, Urine: 1.044 — ABNORMAL HIGH (ref 1.005–1.030)
pH: 5 (ref 5.0–8.0)

## 2019-11-27 LAB — CBC WITH DIFFERENTIAL/PLATELET
Abs Immature Granulocytes: 0.01 10*3/uL (ref 0.00–0.07)
Basophils Absolute: 0 10*3/uL (ref 0.0–0.1)
Basophils Relative: 0 %
Eosinophils Absolute: 0 10*3/uL (ref 0.0–0.5)
Eosinophils Relative: 1 %
HCT: 45 % (ref 36.0–46.0)
Hemoglobin: 15.1 g/dL — ABNORMAL HIGH (ref 12.0–15.0)
Immature Granulocytes: 0 %
Lymphocytes Relative: 37 %
Lymphs Abs: 2.7 10*3/uL (ref 0.7–4.0)
MCH: 30.6 pg (ref 26.0–34.0)
MCHC: 33.6 g/dL (ref 30.0–36.0)
MCV: 91.1 fL (ref 80.0–100.0)
Monocytes Absolute: 0.6 10*3/uL (ref 0.1–1.0)
Monocytes Relative: 8 %
Neutro Abs: 4 10*3/uL (ref 1.7–7.7)
Neutrophils Relative %: 54 %
Platelets: 290 10*3/uL (ref 150–400)
RBC: 4.94 MIL/uL (ref 3.87–5.11)
RDW: 12.2 % (ref 11.5–15.5)
WBC: 7.4 10*3/uL (ref 4.0–10.5)
nRBC: 0 % (ref 0.0–0.2)

## 2019-11-27 LAB — COMPREHENSIVE METABOLIC PANEL
ALT: 21 U/L (ref 0–44)
AST: 20 U/L (ref 15–41)
Albumin: 4.5 g/dL (ref 3.5–5.0)
Alkaline Phosphatase: 86 U/L (ref 38–126)
Anion gap: 10 (ref 5–15)
BUN: 9 mg/dL (ref 6–20)
CO2: 26 mmol/L (ref 22–32)
Calcium: 9.7 mg/dL (ref 8.9–10.3)
Chloride: 103 mmol/L (ref 98–111)
Creatinine, Ser: 1.06 mg/dL — ABNORMAL HIGH (ref 0.44–1.00)
GFR calc Af Amer: >60 mL/min (ref >60)
GFR calc non Af Amer: >60 mL/min (ref >60)
Glucose, Bld: 87 mg/dL (ref 70–99)
Potassium: 4.6 mmol/L (ref 3.5–5.1)
Sodium: 139 mmol/L (ref 135–145)
Total Bilirubin: 1.2 mg/dL (ref 0.3–1.2)
Total Protein: 8.1 g/dL (ref 6.5–8.1)

## 2019-11-27 LAB — LIPASE, BLOOD: Lipase: 29 U/L (ref 11–51)

## 2019-11-27 MED ORDER — ONDANSETRON HCL 4 MG PO TABS: 4.0000 mg | ORAL_TABLET | Freq: Every day | ORAL | 0 refills | Status: DC | PRN

## 2019-11-27 MED ORDER — HYDROCODONE-ACETAMINOPHEN 5-325 MG PO TABS: 1.0000 | ORAL_TABLET | ORAL | 0 refills | Status: DC | PRN

## 2019-11-27 MED ORDER — ONDANSETRON HCL 4 MG/2ML IJ SOLN
4.0000 mg | Freq: Once | INTRAMUSCULAR | Status: AC
  Administered 2019-11-27: 4 mg via INTRAVENOUS
  Filled 2019-11-27: qty 2

## 2019-11-27 MED ORDER — KETOROLAC TROMETHAMINE 30 MG/ML IJ SOLN
15.0000 mg | Freq: Once | INTRAMUSCULAR | Status: AC
  Administered 2019-11-27: 15 mg via INTRAVENOUS
  Filled 2019-11-27: qty 1

## 2019-11-27 MED ORDER — HYDROCODONE-ACETAMINOPHEN 5-325 MG PO TABS
1.0000 | ORAL_TABLET | Freq: Once | ORAL | Status: AC
  Administered 2019-11-27: 1 via ORAL
  Filled 2019-11-27: qty 1

## 2019-11-27 MED ORDER — IOHEXOL 300 MG/ML  SOLN
100.0000 mL | Freq: Once | INTRAMUSCULAR | Status: AC | PRN
  Administered 2019-11-27: 100 mL via INTRAVENOUS

## 2019-11-27 MED ORDER — MORPHINE SULFATE (PF) 4 MG/ML IV SOLN
4.0000 mg | INTRAVENOUS | Status: DC | PRN
  Administered 2019-11-27: 4 mg via INTRAVENOUS
  Filled 2019-11-27: qty 1

## 2019-11-27 MED ORDER — TAMSULOSIN HCL 0.4 MG PO CAPS: 0.4000 mg | ORAL_CAPSULE | Freq: Every day | ORAL | 0 refills | Status: DC

## 2019-11-27 NOTE — ED Notes (Signed)
Signature pad not working. Pt verbalizes understanding of d/c instructions. Denies further concerns or questions

## 2019-11-27 NOTE — ED Notes (Signed)
Pt given crackers as requested.

## 2019-11-27 NOTE — ED Provider Notes (Signed)
[facility] Emergency Department Provider Note    First MD Initiated Contact with Patient 11/27/19 1030     (approximate)  I have reviewed the triage vital signs and the nursing notes.   HISTORY  Chief Complaint Flank Pain    HPI Emily [name] is a 39 y.o. female symptoms as listed below presents the ER for recurrent right-sided abdominal pain.  States that this 1 episode is new but she is never had flank pain before.  Denies any fevers.  Does have associated nausea.  Was directed by her PCP to fast med then directed to the ER due to concern for kidney stones.  Does not any history of kidney stones.  States she is status post hysterectomy and oophorectomy.  Does still have her appendix.  States the pain is mild to moderate.    Past Medical History:  Diagnosis Date  . Anemia   . Asthma    sports induced inhalers  . Bipolar 1 disorder ([place])   . Conversion disorder   . Insomnia   . Migraines    migraines  . MRSA infection 2010  . Ovarian cyst    left 2.5cm  . Sleep apnea    does not use a CPAP  . Vitamin B 12 deficiency   . Vitamin D deficiency    Family History  Problem Relation Age of Onset  . Bipolar disorder Mother   . Anxiety disorder Mother   . Cancer Mother        brain  . Glaucoma Mother   . Heart disease Father   . Diabetes Father   . Cancer Father   . Bipolar disorder Sister   . Schizophrenia Sister   . Cancer Maternal Grandmother        lung  . Heart disease Maternal Grandfather   . Breast cancer Brother   . Ovarian cancer Neg Hx    Past Surgical History:  Procedure Laterality Date  . ABDOMINAL HYSTERECTOMY    . BUNIONECTOMY    . ESOPHAGOGASTRODUODENOSCOPY (EGD) WITH PROPOFOL N/A 11/03/2018   Procedure: ESOPHAGOGASTRODUODENOSCOPY (EGD) WITH PROPOFOL;  Surgeon: Jonathon Bellows, MD;  Location: [facility] ENDOSCOPY;  Service: Gastroenterology;  Laterality: N/A;  . HAMMER TOE SURGERY    . HAND SURGERY  2014   cyst removed  .  INTRAUTERINE DEVICE (IUD) INSERTION N/A 05/09/2015   Procedure: INTRAUTERINE DEVICE (IUD) INSERTION;  Surgeon: Rubie Maid, MD;  Location: [facility] ORS;  Service: Gynecology;  Laterality: N/A;  . LAPAROSCOPIC ASSISTED VAGINAL HYSTERECTOMY N/A 06/20/2015   Procedure: LAPAROSCOPIC ASSISTED VAGINAL HYSTERECTOMY, BILATERAL SALPINGECTOMY, CYSTOSCOPY, IUD REMOVAL ;  Surgeon: Rubie Maid, MD;  Location: [facility] ORS;  Service: Gynecology;  Laterality: N/A;  . LAPAROSCOPIC TUBAL LIGATION Bilateral 05/09/2015   Procedure: LAPAROSCOPIC TUBAL LIGATION;  Surgeon: Rubie Maid, MD;  Location: [facility] ORS;  Service: Gynecology;  Laterality: Bilateral;  . NASAL SINUS SURGERY    . TUBAL LIGATION  2007   Patient Active Problem List   Diagnosis Date Noted  . Irritable bowel syndrome with diarrhea 10/27/2018  . Surgical menopause, symptomatic 02/12/2018  . Pelvic adhesions 01/20/2018  . Obesity 07/03/2016  . Status post laparoscopic hysterectomy 06/20/2015  . Insomnia 04/14/2015  . Asthma 04/14/2015  . Bipolar disorder ([place]) 04/14/2015  . Vitamin B12 deficiency 04/14/2015  . Vitamin D deficiency 04/14/2015  . Migraine 04/14/2015  . Sleep apnea 04/14/2015      Prior to Admission medications   Medication Sig Start Date End Date Taking? Authorizing Provider  albuterol (PROAIR HFA) 108 (90 Base) MCG/ACT inhaler Inhale 2 puffs into the lungs every 6 (six) hours as needed for wheezing or shortness of breath. 12/10/18   Particia Nearing, PA-C  buPROPion (WELLBUTRIN XL) 300 MG 24 hr tablet Take 1 tablet (300 mg total) by mouth daily. 08/04/19   Cannady, Corrie Dandy T, NP  cyanocobalamin (,VITAMIN B-12,) 1000 MCG/ML injection Inject 1 mL (1,000 mcg total) into the muscle every 14 (fourteen) days. 11/25/19   Particia Nearing, PA-C  Cyanocobalamin (VITAMIN B 12 PO) Take by mouth daily.    [provider]  fluticasone (FLONASE) 50 MCG/ACT nasal spray Place 2 sprays into both nostrils daily as needed for allergies  or rhinitis.    [provider]  HYDROcodone-acetaminophen (NORCO) 5-325 MG tablet Take 1 tablet by mouth every 4 (four) hours as needed for moderate pain. 11/27/19   Willy Eddy, MD  ibuprofen (ADVIL,MOTRIN) 800 MG tablet Take 1 tablet (800 mg total) by mouth every 8 (eight) hours as needed. 01/20/18   Hildred Laser, MD  Insulin Pen Needle (PEN NEEDLES) 32G X 6 MM MISC 1 Units by Does not apply route daily. 11/25/19   Particia Nearing, PA-C  Liraglutide -Weight Management (SAXENDA) 18 MG/3ML SOPN Inject 0.1 mLs (0.6 mg total) into the skin daily for 7 days, THEN 0.2 mLs (1.2 mg total) daily for 7 days, THEN 0.3 mLs (1.8 mg total) daily for 7 days, THEN 0.4 mLs (2.4 mg total) daily for 7 days, THEN 0.5 mLs (3 mg total) daily. 11/25/19 03/22/20  Particia Nearing, PA-C  NEEDLE, DISP, 18 G 18G X 1" MISC 1 Units by Does not apply route every 14 (fourteen) days. 11/25/19   Particia Nearing, PA-C  Omega-3 Fatty Acids (FISH OIL PO) Take by mouth daily.    [provider]  ondansetron (ZOFRAN) 4 MG tablet Take 1 tablet (4 mg total) by mouth daily as needed. 11/27/19 11/26/20  Willy Eddy, MD  tamsulosin (FLOMAX) 0.4 MG CAPS capsule Take 1 capsule (0.4 mg total) by mouth daily after supper. 11/27/19   Willy Eddy, MD  VITAMIN D PO Take by mouth daily. Vitamin D3    [provider]    Allergies Codeine    Social History Social History   Tobacco Use  . Smoking status: Former Smoker    Packs/day: 1.00    Years: 10.00    Pack years: 10.00    Types: Cigarettes    Quit date: 09/25/2003    Years since quitting: 16.1  . Smokeless tobacco: Never Used  Substance Use Topics  . Alcohol use: Yes    Alcohol/week: 0.0 - 1.0 standard drinks    Comment: pt states she is a social drinker  . Drug use: No    Review of Systems Patient denies headaches, rhinorrhea, blurry vision, numbness, shortness of breath, chest pain, edema, cough, abdominal pain, nausea,  vomiting, diarrhea, dysuria, fevers, rashes or hallucinations unless otherwise stated above in HPI. ____________________________________________   PHYSICAL EXAM:  VITAL SIGNS: Vitals:   11/27/19 1300 11/27/19 1345  BP: 123/78 121/68  Pulse: 60 65  Resp:  20  Temp:  98.2 F (36.8 C)  SpO2: 99% 98%    Constitutional: Alert and oriented.  Eyes: Conjunctivae are normal.  Head: Atraumatic. Nose: No congestion/rhinnorhea. Mouth/Throat: Mucous membranes are moist.   Neck: No stridor. Painless ROM.  Cardiovascular: Normal rate, regular rhythm. Grossly normal heart sounds.  Good peripheral circulation. Respiratory: Normal respiratory effort.  No retractions.  Lungs CTAB. Gastrointestinal: Soft, obese, with ttp in RLQ and Right flank. No distention. No abdominal bruits. No CVA tenderness. Genitourinary:  Musculoskeletal: No lower extremity tenderness nor edema.  No joint effusions. Neurologic:  Normal speech and language. No gross focal neurologic deficits are appreciated. No facial droop Skin:  Skin is warm, dry and intact. No rash noted. Psychiatric: Mood and affect are normal. Speech and behavior are normal.  ____________________________________________   LABS (all labs ordered are listed, but only abnormal results are displayed)  Results for orders placed or performed during the hospital encounter of 11/27/19 (from the past 24 hour(s))  CBC with Differential/Platelet     Status: Abnormal   Collection Time: 11/27/19 10:50 AM  Result Value Ref Range   WBC 7.4 4.0 - 10.5 K/uL   RBC 4.94 3.87 - 5.11 MIL/uL   Hemoglobin 15.1 (H) 12.0 - 15.0 g/dL   HCT 07.3 71.0 - 62.6 %   MCV 91.1 80.0 - 100.0 fL   MCH 30.6 26.0 - 34.0 pg   MCHC 33.6 30.0 - 36.0 g/dL   RDW 94.8 54.6 - 27.0 %   Platelets 290 150 - 400 K/uL   nRBC 0.0 0.0 - 0.2 %   Neutrophils Relative % 54 %   Neutro Abs 4.0 1.7 - 7.7 K/uL   Lymphocytes Relative 37 %   Lymphs Abs 2.7 0.7 - 4.0 K/uL   Monocytes Relative 8 %     Monocytes Absolute 0.6 0.1 - 1.0 K/uL   Eosinophils Relative 1 %   Eosinophils Absolute 0.0 0.0 - 0.5 K/uL   Basophils Relative 0 %   Basophils Absolute 0.0 0.0 - 0.1 K/uL   Immature Granulocytes 0 %   Abs Immature Granulocytes 0.01 0.00 - 0.07 K/uL  Comprehensive metabolic panel     Status: Abnormal   Collection Time: 11/27/19 10:50 AM  Result Value Ref Range   Sodium 139 135 - 145 mmol/L   Potassium 4.6 3.5 - 5.1 mmol/L   Chloride 103 98 - 111 mmol/L   CO2 26 22 - 32 mmol/L   Glucose, Bld 87 70 - 99 mg/dL   BUN 9 6 - 20 mg/dL   Creatinine, Ser 3.50 (H) 0.44 - 1.00 mg/dL   Calcium 9.7 8.9 - 09.3 mg/dL   Total Protein 8.1 6.5 - 8.1 g/dL   Albumin 4.5 3.5 - 5.0 g/dL   AST 20 15 - 41 U/L   ALT 21 0 - 44 U/L   Alkaline Phosphatase 86 38 - 126 U/L   Total Bilirubin 1.2 0.3 - 1.2 mg/dL   GFR calc non Af Amer >60 >60 mL/min   GFR calc Af Amer >60 >60 mL/min   Anion gap 10 5 - 15  Lipase, blood     Status: None   Collection Time: 11/27/19 10:50 AM  Result Value Ref Range   Lipase 29 11 - 51 U/L  Urinalysis, Complete w Microscopic     Status: Abnormal   Collection Time: 11/27/19 12:04 PM  Result Value Ref Range   Color, Urine YELLOW (A) YELLOW   APPearance CLEAR (A) CLEAR   Specific Gravity, Urine 1.044 (H) 1.005 - 1.030   pH 5.0 5.0 - 8.0   Glucose, UA NEGATIVE NEGATIVE mg/dL   Hgb urine dipstick LARGE (A) NEGATIVE   Bilirubin Urine NEGATIVE NEGATIVE   Ketones, ur NEGATIVE NEGATIVE mg/dL   Protein, ur NEGATIVE NEGATIVE mg/dL   Nitrite NEGATIVE NEGATIVE   Leukocytes,Ua NEGATIVE NEGATIVE   RBC /  HPF >50 (H) 0 - 5 RBC/hpf   WBC, UA 0-5 0 - 5 WBC/hpf   Bacteria, UA RARE (A) NONE SEEN   Squamous Epithelial / LPF 0-5 0 - 5   Mucus PRESENT    ____________________________________________ ___________________________________  RADIOLOGY  I personally reviewed all radiographic images ordered to evaluate for the above acute complaints and reviewed radiology reports and  findings.  These findings were personally discussed with the patient.  Please see medical record for radiology report.  ____________________________________________   PROCEDURES  Procedure(s) performed:  Procedures    Critical Care performed: no ____________________________________________   INITIAL IMPRESSION / ASSESSMENT AND PLAN / ED COURSE  Pertinent labs & imaging results that were available during my care of the patient were reviewed by me and considered in my medical decision making (see chart for details).   DDX: stone, appy, pyelo, cystiits, msk strain  Emily [name] is a 39 y.o. who presents to the ED with present described above.  Work-up including blood work as well as CT imaging ordered but differential does show evidence of right ureteral stone.  No evidence of infection.  Pain controlled here in the ER.  No signs of sepsis.  Appropriate for outpatient follow-up.     The patient was evaluated in Emergency Department today for the symptoms described in the history of present illness. He/she was evaluated in the context of the global COVID-19 pandemic, which necessitated consideration that the patient might be at risk for infection with the SARS-CoV-2 virus that causes COVID-19. Institutional protocols and algorithms that pertain to the evaluation of patients at risk for COVID-19 are in a state of rapid change based on information released by regulatory bodies including the CDC and federal and state organizations. These policies and algorithms were followed during the patient's care in the ED.  As part of my medical decision making, I reviewed the following data within the electronic MEDICAL RECORD NUMBER Nursing notes reviewed and incorporated, Labs reviewed, notes from prior ED visits and [place] Controlled Substance Database   ____________________________________________   FINAL CLINICAL IMPRESSION(S) / ED DIAGNOSES  Final diagnoses:  Kidney stone      NEW MEDICATIONS  STARTED DURING THIS VISIT:  Discharge Medication List as of 11/27/2019  1:31 PM    START taking these medications   Details  HYDROcodone-acetaminophen (NORCO) 5-325 MG tablet Take 1 tablet by mouth every 4 (four) hours as needed for moderate pain., Starting Fri 11/27/2019, Normal    ondansetron (ZOFRAN) 4 MG tablet Take 1 tablet (4 mg total) by mouth daily as needed., Starting Fri 11/27/2019, Until Sat 11/26/2020, Normal    tamsulosin (FLOMAX) 0.4 MG CAPS capsule Take 1 capsule (0.4 mg total) by mouth daily after supper., Starting Fri 11/27/2019, Normal         Note:  This document was prepared using Dragon voice recognition software and may include unintentional dictation errors.    Willy Eddy, MD 11/27/19 [phone]

## 2019-11-27 NOTE — ED Triage Notes (Signed)
Pt here for right flank pain with hematuria. Pain radiates around to RLQ.  Sent from urgent care for possible kidney stones.

## 2019-11-27 NOTE — ED Notes (Addendum)
Pt ambulatory to restroom with standby assist, able to void and provide urine sample

## 2019-11-30 ENCOUNTER — Ambulatory Visit
Admission: RE | Admit: 2019-11-30 | Discharge: 2019-11-30 | Disposition: A | Discharge status: Home / Self Care | Payer: Commercial Managed Care - PPO | Source: Ambulatory Visit | Attending: Urology | Admitting: Urology

## 2019-11-30 ENCOUNTER — Ambulatory Visit (INDEPENDENT_AMBULATORY_CARE_PROVIDER_SITE_OTHER): Payer: Commercial Managed Care - PPO | Admitting: Urology

## 2019-11-30 ENCOUNTER — Other Ambulatory Visit: Payer: Self-pay

## 2019-11-30 ENCOUNTER — Encounter: Payer: Self-pay | Admitting: Urology

## 2019-11-30 VITALS — BP 132/84 | HR 99 | Ht 68.0 in | Wt 263.0 lb

## 2019-11-30 DIAGNOSIS — N23 Unspecified renal colic: Secondary | ICD-10-CM | POA: Diagnosis not present

## 2019-11-30 DIAGNOSIS — N200 Calculus of kidney: Secondary | ICD-10-CM

## 2019-11-30 DIAGNOSIS — N201 Calculus of ureter: Secondary | ICD-10-CM

## 2019-11-30 LAB — URINALYSIS, COMPLETE
Bilirubin, UA: NEGATIVE
Glucose, UA: NEGATIVE
Ketones, UA: NEGATIVE
Leukocytes,UA: NEGATIVE
Nitrite, UA: NEGATIVE
Protein,UA: NEGATIVE
Specific Gravity, UA: 1.025 (ref 1.005–1.030)
Urobilinogen, Ur: 0.2 mg/dL (ref 0.2–1.0)
pH, UA: 6 (ref 5.0–7.5)

## 2019-11-30 LAB — MICROSCOPIC EXAMINATION: Bacteria, UA: NONE SEEN

## 2019-12-01 ENCOUNTER — Telehealth: Payer: Self-pay | Admitting: Radiology

## 2019-12-01 NOTE — Telephone Encounter (Signed)
Would defer metabolic evaluation until at least a month after this current stone episode resolves

## 2019-12-01 NOTE — Telephone Encounter (Signed)
Patient states she would like to continue trial of passage for several days before making a decision. She asked about bloodwork that was discussed as well (possibly LithoLink). What follow up would be recommended? Please advise.

## 2019-12-01 NOTE — Telephone Encounter (Signed)
-----   Message from Riki Altes, MD sent at 12/01/2019 11:16 AM EST ----- Emily [name] is visualized on KUB.  At yesterday's visit we discussed trial of passage versus lithotripsy versus ureteroscopy.  She had indicated she may be interested in lithotripsy.

## 2019-12-02 ENCOUNTER — Encounter: Payer: Self-pay | Admitting: Urology

## 2019-12-02 NOTE — Progress Notes (Signed)
11/30/2019 7:57 AM   Emily [name] [birth year] [id number]  Referring provider: Volney American, PA-C [address] [place],  [place] [address]  Chief Complaint  Patient presents with  . Other    HPI: Emily [name] is a 39 y.o. female who presents for evaluation of renal colic  -Presented to [facility] ED 11/27/2019 complaining of right flank/right lower quadrant abdominal pain -No identifiable precipitating, aggravating or alleviating factors. -Severity rated moderate -Denied fever, chills. + Nausea without vomiting -CT with 3 mm right distal ureteral calculus; bilateral, nonobstructing renal calculi -CT 2017 showed no urinary tract calculi -Since ED visit continues with intermittent pain.    -Discharged on tamsulosin, Zofran, hydrocodone -Does not like narcotic pain meds and taking ibuprofen   PMH: Past Medical History:  Diagnosis Date  . Anemia   . Asthma    sports induced inhalers  . Bipolar 1 disorder ([place])   . Conversion disorder   . Insomnia   . Migraines    migraines  . MRSA infection 2010  . Ovarian cyst    left 2.5cm  . Sleep apnea    does not use a CPAP  . Vitamin B 12 deficiency   . Vitamin D deficiency     Surgical History: Past Surgical History:  Procedure Laterality Date  . ABDOMINAL HYSTERECTOMY    . BUNIONECTOMY    . ESOPHAGOGASTRODUODENOSCOPY (EGD) WITH PROPOFOL N/A 11/03/2018   Procedure: ESOPHAGOGASTRODUODENOSCOPY (EGD) WITH PROPOFOL;  Surgeon: Jonathon Bellows, MD;  Location: [facility] ENDOSCOPY;  Service: Gastroenterology;  Laterality: N/A;  . HAMMER TOE SURGERY    . HAND SURGERY  2014   cyst removed  . INTRAUTERINE DEVICE (IUD) INSERTION N/A 05/09/2015   Procedure: INTRAUTERINE DEVICE (IUD) INSERTION;  Surgeon: Rubie Maid, MD;  Location: [facility] ORS;  Service: Gynecology;  Laterality: N/A;  . LAPAROSCOPIC ASSISTED VAGINAL HYSTERECTOMY N/A 06/20/2015   Procedure: LAPAROSCOPIC ASSISTED VAGINAL HYSTERECTOMY, BILATERAL SALPINGECTOMY, CYSTOSCOPY, IUD REMOVAL ;   Surgeon: Rubie Maid, MD;  Location: [facility] ORS;  Service: Gynecology;  Laterality: N/A;  . LAPAROSCOPIC TUBAL LIGATION Bilateral 05/09/2015   Procedure: LAPAROSCOPIC TUBAL LIGATION;  Surgeon: Rubie Maid, MD;  Location: [facility] ORS;  Service: Gynecology;  Laterality: Bilateral;  . NASAL SINUS SURGERY    . TUBAL LIGATION  2007    Home Medications:  Allergies as of 11/30/2019      Reactions   Codeine Diarrhea, Nausea And Vomiting      Medication List       Accurate as of November 30, 2019 11:59 PM. If you have any questions, ask your nurse or doctor.        albuterol 108 (90 Base) MCG/ACT inhaler Commonly known as: ProAir HFA Inhale 2 puffs into the lungs every 6 (six) hours as needed for wheezing or shortness of breath.   buPROPion 300 MG 24 hr tablet Commonly known as: Wellbutrin XL Take 1 tablet (300 mg total) by mouth daily.   FISH OIL PO Take by mouth daily.   fluticasone 50 MCG/ACT nasal spray Commonly known as: FLONASE Place 2 sprays into both nostrils daily as needed for allergies or rhinitis.   HYDROcodone-acetaminophen 5-325 MG tablet Commonly known as: Norco Take 1 tablet by mouth every 4 (four) hours as needed for moderate pain.   ibuprofen 800 MG tablet Commonly known as: ADVIL Take 1 tablet (800 mg total) by mouth every 8 (eight) hours as needed.   NEEDLE (DISP) 18 G 18G X 1" Misc 1 Units by Does not apply route  every 14 (fourteen) days.   ondansetron 4 MG tablet Commonly known as: Zofran Take 1 tablet (4 mg total) by mouth daily as needed.   Pen Needles 32G X 6 MM Misc 1 Units by Does not apply route daily.   Saxenda 18 MG/3ML Sopn Generic drug: Liraglutide -Weight Management Inject 0.1 mLs (0.6 mg total) into the skin daily for 7 days, THEN 0.2 mLs (1.2 mg total) daily for 7 days, THEN 0.3 mLs (1.8 mg total) daily for 7 days, THEN 0.4 mLs (2.4 mg total) daily for 7 days, THEN 0.5 mLs (3 mg total) daily. Start taking on: November 25, 2019   tamsulosin 0.4 MG  Caps capsule Commonly known as: FLOMAX Take 1 capsule (0.4 mg total) by mouth daily after supper.   VITAMIN B 12 PO Take by mouth daily.   cyanocobalamin 1000 MCG/ML injection Commonly known as: (VITAMIN B-12) Inject 1 mL (1,000 mcg total) into the muscle every 14 (fourteen) days.   VITAMIN D PO Take by mouth daily. Vitamin D3       Allergies:  Allergies  Allergen Reactions  . Codeine Diarrhea and Nausea And Vomiting    Family History: Family History  Problem Relation Age of Onset  . Bipolar disorder Mother   . Anxiety disorder Mother   . Cancer Mother        brain  . Glaucoma Mother   . Heart disease Father   . Diabetes Father   . Cancer Father   . Bipolar disorder Sister   . Schizophrenia Sister   . Cancer Maternal Grandmother        lung  . Heart disease Maternal Grandfather   . Breast cancer Brother   . Ovarian cancer Neg Hx     Social History:  reports that she quit smoking about 16 years ago. Her smoking use included cigarettes. She has a 10.00 pack-year smoking history. She has never used smokeless tobacco. She reports current alcohol use. She reports that she does not use drugs.   Physical Exam: BP 132/84 (BP Location: Left Arm, Patient Position: Sitting, Cuff Size: Large)   Pulse 99   Ht 5\' 8"  (1.727 m)   Wt 263 lb (119.3 kg)   LMP 06/01/2015 (Within Days)   BMI 39.99 kg/m   Constitutional:  Alert and oriented, No acute distress. HEENT: [place] AT, moist mucus membranes.  Trachea midline, no masses. Cardiovascular: No clubbing, cyanosis, or edema. Respiratory: Normal respiratory effort, no increased work of breathing. Skin: No rashes, bruises or suspicious lesions. Neurologic: Grossly intact, no focal deficits, moving all 4 extremities. Psychiatric: Normal mood and affect.  Laboratory Data:  Urinalysis Dipstick trace blood Microscopy negative  Pertinent Imaging: CT 11/27/2019 personally reviewed  Assessment & Plan:    - Ureteral calculus 3  mm right distal ureteral calculus.  She was informed there is a >90% chance this stone will pass.  Other options were discussed including ureteroscopic removal and shockwave lithotripsy if the calculus is visualized on KUB.  She would like to think over these options.  A KUB was ordered and she will be notified with results.   01/27/2020, MD  [facility] [address], [address] [place], [place] [phone]

## 2019-12-02 NOTE — Telephone Encounter (Signed)
Follow up appointment made with PA per Dr Lonna Cobb. Advised patient to call back if symptoms return. Patient verbalized understanding.

## 2019-12-02 NOTE — Telephone Encounter (Signed)
PA Approved

## 2019-12-06 NOTE — Assessment & Plan Note (Signed)
Start saxenda, continue working on diet and exercise. Counseling given at length today on risks, benefits of medication and lifestyle habits

## 2019-12-06 NOTE — Assessment & Plan Note (Signed)
Recheck levels now that taking OTC supplement

## 2019-12-06 NOTE — Assessment & Plan Note (Signed)
Restart injections at twice monthly for now, and recheck levels today. Monitor for benefit with sxs

## 2019-12-07 ENCOUNTER — Other Ambulatory Visit: Payer: Self-pay | Admitting: Radiology

## 2019-12-07 DIAGNOSIS — N201 Calculus of ureter: Secondary | ICD-10-CM

## 2019-12-15 ENCOUNTER — Encounter: Payer: Self-pay | Admitting: Urology

## 2019-12-15 ENCOUNTER — Ambulatory Visit
Admission: RE | Admit: 2019-12-15 | Discharge: 2019-12-15 | Disposition: A | Discharge status: Home / Self Care | Payer: Commercial Managed Care - PPO | Source: Ambulatory Visit | Attending: Urology | Admitting: Urology

## 2019-12-15 ENCOUNTER — Ambulatory Visit (INDEPENDENT_AMBULATORY_CARE_PROVIDER_SITE_OTHER): Payer: Commercial Managed Care - PPO | Admitting: Urology

## 2019-12-15 ENCOUNTER — Other Ambulatory Visit: Payer: Self-pay

## 2019-12-15 VITALS — BP 137/81 | HR 106 | Ht 68.0 in | Wt 254.0 lb

## 2019-12-15 DIAGNOSIS — N201 Calculus of ureter: Secondary | ICD-10-CM

## 2019-12-15 DIAGNOSIS — N132 Hydronephrosis with renal and ureteral calculous obstruction: Secondary | ICD-10-CM

## 2019-12-15 LAB — URINALYSIS, COMPLETE
Bilirubin, UA: NEGATIVE
Glucose, UA: NEGATIVE
Leukocytes,UA: NEGATIVE
Nitrite, UA: NEGATIVE
Protein,UA: NEGATIVE
Specific Gravity, UA: >1.03 — ABNORMAL HIGH (ref 1.005–1.030)
Urobilinogen, Ur: 0.2 mg/dL (ref 0.2–1.0)
pH, UA: 5.5 (ref 5.0–7.5)

## 2019-12-15 LAB — MICROSCOPIC EXAMINATION

## 2019-12-15 NOTE — Progress Notes (Signed)
12/15/2019 4:24 PM   Emily [name] 1981/05/29 [id number]  Referring provider: Volney American, PA-C [address] [place],  [place] [address]  Chief Complaint  Patient presents with  . Follow-up    HPI: Emily [name] is a 39 year old female with nephrolithiasis who presents today for follow up KUB to monitor MET of a 3 mm right UVJ stone.  She was seen at the ED on 11/27/2019 for right flank and lower quadrant pain.  CT with 3 mm right distal ureteral calculus with bilateral non obstructing renal calculi.   She has chosen MET at this time.  She states that she has not passed a distinct fragment.  She is no longer having the intense flank pain that she was experiencing in the ED.  Patient denies any modifying or aggravating factors.  Patient denies any gross hematuria, dysuria or suprapubic/flank pain.  Patient denies any fevers, chills, nausea or vomiting.   KUB 12/15/2019 ? Of the continued presence of the distal right stone.    Her UA is slightly clouded and yellow, trace ketone, specific gravity > 1.030, 0-5 WBC's, 0-2 RBC's, 0-10 epithelial cells and moderate bacteria.    PMH: Past Medical History:  Diagnosis Date  . Anemia   . Asthma    sports induced inhalers  . Bipolar 1 disorder ([place])   . Conversion disorder   . Insomnia   . Migraines    migraines  . MRSA infection 2010  . Ovarian cyst    left 2.5cm  . Sleep apnea    does not use a CPAP  . Vitamin B 12 deficiency   . Vitamin D deficiency     Surgical History: Past Surgical History:  Procedure Laterality Date  . ABDOMINAL HYSTERECTOMY    . BUNIONECTOMY    . ESOPHAGOGASTRODUODENOSCOPY (EGD) WITH PROPOFOL N/A 11/03/2018   Procedure: ESOPHAGOGASTRODUODENOSCOPY (EGD) WITH PROPOFOL;  Surgeon: Jonathon Bellows, MD;  Location: [facility] ENDOSCOPY;  Service: Gastroenterology;  Laterality: N/A;  . HAMMER TOE SURGERY    . HAND SURGERY  2014   cyst removed  . INTRAUTERINE DEVICE (IUD) INSERTION N/A 05/09/2015   Procedure:  INTRAUTERINE DEVICE (IUD) INSERTION;  Surgeon: Rubie Maid, MD;  Location: [facility] ORS;  Service: Gynecology;  Laterality: N/A;  . LAPAROSCOPIC ASSISTED VAGINAL HYSTERECTOMY N/A 06/20/2015   Procedure: LAPAROSCOPIC ASSISTED VAGINAL HYSTERECTOMY, BILATERAL SALPINGECTOMY, CYSTOSCOPY, IUD REMOVAL ;  Surgeon: Rubie Maid, MD;  Location: [facility] ORS;  Service: Gynecology;  Laterality: N/A;  . LAPAROSCOPIC TUBAL LIGATION Bilateral 05/09/2015   Procedure: LAPAROSCOPIC TUBAL LIGATION;  Surgeon: Rubie Maid, MD;  Location: [facility] ORS;  Service: Gynecology;  Laterality: Bilateral;  . NASAL SINUS SURGERY    . TUBAL LIGATION  2007    Home Medications:  Allergies as of 12/15/2019      Reactions   Codeine Diarrhea, Nausea And Vomiting      Medication List       Accurate as of December 15, 2019  4:24 PM. If you have any questions, ask your nurse or doctor.        albuterol 108 (90 Base) MCG/ACT inhaler Commonly known as: ProAir HFA Inhale 2 puffs into the lungs every 6 (six) hours as needed for wheezing or shortness of breath.   buPROPion 300 MG 24 hr tablet Commonly known as: Wellbutrin XL Take 1 tablet (300 mg total) by mouth daily.   FISH OIL PO Take by mouth daily.   fluticasone 50 MCG/ACT nasal spray Commonly known as: [place]  2 sprays into both nostrils daily as needed for allergies or rhinitis.   HYDROcodone-acetaminophen 5-325 MG tablet Commonly known as: Norco Take 1 tablet by mouth every 4 (four) hours as needed for moderate pain.   ibuprofen 800 MG tablet Commonly known as: ADVIL Take 1 tablet (800 mg total) by mouth every 8 (eight) hours as needed.   NEEDLE (DISP) 18 G 18G X 1" Misc 1 Units by Does not apply route every 14 (fourteen) days.   ondansetron 4 MG tablet Commonly known as: Zofran Take 1 tablet (4 mg total) by mouth daily as needed.   Pen Needles 32G X 6 MM Misc 1 Units by Does not apply route daily.   Saxenda 18 MG/3ML Sopn Generic drug: Liraglutide -Weight  Management Inject 0.1 mLs (0.6 mg total) into the skin daily for 7 days, THEN 0.2 mLs (1.2 mg total) daily for 7 days, THEN 0.3 mLs (1.8 mg total) daily for 7 days, THEN 0.4 mLs (2.4 mg total) daily for 7 days, THEN 0.5 mLs (3 mg total) daily. Start taking on: November 25, 2019   tamsulosin 0.4 MG Caps capsule Commonly known as: FLOMAX Take 1 capsule (0.4 mg total) by mouth daily after supper.   VITAMIN B 12 PO Take by mouth daily.   cyanocobalamin 1000 MCG/ML injection Commonly known as: (VITAMIN B-12) Inject 1 mL (1,000 mcg total) into the muscle every 14 (fourteen) days.   VITAMIN D PO Take by mouth daily. Vitamin D3       Allergies:  Allergies  Allergen Reactions  . Codeine Diarrhea and Nausea And Vomiting    Family History: Family History  Problem Relation Age of Onset  . Bipolar disorder Mother   . Anxiety disorder Mother   . Cancer Mother        brain  . Glaucoma Mother   . Heart disease Father   . Diabetes Father   . Cancer Father   . Bipolar disorder Sister   . Schizophrenia Sister   . Cancer Maternal Grandmother        lung  . Heart disease Maternal Grandfather   . Breast cancer Brother   . Ovarian cancer Neg Hx     Social History:  reports that she quit smoking about 16 years ago. Her smoking use included cigarettes. She has a 10.00 pack-year smoking history. She has never used smokeless tobacco. She reports current alcohol use. She reports that she does not use drugs.  ROS: Pertinent ROS in HPI  Physical Exam: BP 137/81   Pulse (!) 106   Ht '5\' 8"'$  (1.727 m)   Wt 254 lb (115.2 kg)   LMP 06/01/2015 (Within Days)   BMI 38.62 kg/m    Constitutional:  Well nourished. Alert and oriented, No acute distress. HEENT: [place] AT, mask in place.  Trachea midline, no masses. Cardiovascular: No clubbing, cyanosis, or edema. Respiratory: Normal respiratory effort, no increased work of breathing. Neurologic: Grossly intact, no focal deficits, moving all 4  extremities. Psychiatric: Normal mood and affect.  Laboratory Data: Lab Results  Component Value Date   WBC 7.4 11/27/2019   HGB 15.1 (H) 11/27/2019   HCT 45.0 11/27/2019   MCV 91.1 11/27/2019   PLT 290 11/27/2019    Lab Results  Component Value Date   CREATININE 1.06 (H) 11/27/2019    Lab Results  Component Value Date   TESTOSTERONE 28 10/02/2016    Lab Results  Component Value Date   TSH 3.320 08/04/2019  Component Value Date/Time   CHOL 181 08/04/2019 1455   HDL 42 08/04/2019 1455   CHOLHDL 5.0 (H) 07/31/2018 1014   LDLCALC 114 (H) 08/04/2019 1455    Lab Results  Component Value Date   AST 20 11/27/2019   Lab Results  Component Value Date   ALT 21 11/27/2019    Lab Results  Component Value Date   ESTRADIOL CANCELED 10/02/2016    Urinalysis Component     Latest Ref Rng & Units 12/15/2019  Specific Gravity, UA     1.005 - 1.030 >1.030 (H)  pH, UA     5.0 - 7.5 5.5  Color, UA     Yellow Yellow  Appearance Ur     Clear Clear  Leukocytes,UA     Negative Negative  Protein,UA     Negative/Trace Negative  Glucose, UA     Negative Negative  Ketones, UA     Negative Trace (A)  RBC, UA     Negative Trace (A)  Bilirubin, UA     Negative Negative  Urobilinogen, Ur     0.2 - 1.0 mg/dL 0.2  Nitrite, UA     Negative Negative  Microscopic Examination      See below:   Component     Latest Ref Rng & Units 12/15/2019  WBC, UA     0 - 5 /hpf 0-5  RBC     0 - 2 /hpf 0-2  Epithelial Cells (non renal)     0 - 10 /hpf 0-10  Bacteria, UA     None seen/Few Moderate (A)    I have reviewed the labs.   Pertinent Imaging: CLINICAL DATA:  Ureteral calculus. Additional history provided: Kidney stone removed 3 weeks ago; no pain currently.  EXAM: ABDOMEN - 1 VIEW  COMPARISON:  Abdominal radiograph 11/30/2019, CT abdomen/pelvis 11/27/2019  FINDINGS: Unchanged subtle calcific density above phleboliths within the right hemipelvis  measuring 2 mm.  Redemonstrated calculus in the interpolar left kidney measuring 3 mm. The small calculi demonstrated within the right kidney on recent CT abdomen/pelvis 11/27/2019 are not well appreciated on the current study.  Nonobstructive bowel gas pattern.  No acute bony abnormality is identified.  IMPRESSION: No significant interval change as compared to abdominal radiograph 11/30/2019.  Redemonstrated 2 mm calcific density within the right hemipelvis which may correspond with the distal ureteral calculus present on CT 11/27/2019 or may represent a small phlebolith.  Unchanged small calculus within the interpolar left kidney.  Nonobstructive bowel gas pattern.   Electronically Signed   By: Kellie Simmering DO   On: 12/16/2019 08:32 I have independently reviewed the films the calcification in question may be a stone but will get RUS to check for hydronephrosis.   Assessment & Plan:    1. Right ureteral stone - Unsure if passed, but patient is asymptomatic and UA is not impressive - will pursue 24 metabolic work up if RUS is negative for hydronephrosis  2. Right hydronephrosis - will obtain RUS to ensure the hydronephrosis has resolved   3. Gross hematuria - UA today demonstrates no hematuria - continue to monitor the patient's UA after the treatment/passage of the stone to ensure the hematuria has resolved - if hematuria persists, we will pursue a hematuria workup with CT Urogram and cystoscopy if appropriate.    Return in about 1 month (around 01/15/2020) for RUS report .  These notes generated with voice recognition software. I apologize for typographical errors.  Zian Mohamed, PA-C  [place], [place] [address] [phone]

## 2019-12-19 LAB — CULTURE, URINE COMPREHENSIVE

## 2019-12-23 ENCOUNTER — Other Ambulatory Visit: Payer: Self-pay

## 2019-12-23 ENCOUNTER — Ambulatory Visit (INDEPENDENT_AMBULATORY_CARE_PROVIDER_SITE_OTHER): Payer: Commercial Managed Care - PPO | Admitting: Family Medicine

## 2019-12-23 ENCOUNTER — Encounter: Payer: Self-pay | Admitting: Family Medicine

## 2019-12-23 VITALS — BP 117/80 | HR 71 | Temp 98.1°F | Wt 253.0 lb

## 2019-12-23 DIAGNOSIS — E6609 Other obesity due to excess calories: Secondary | ICD-10-CM

## 2019-12-23 DIAGNOSIS — E538 Deficiency of other specified B group vitamins: Secondary | ICD-10-CM | POA: Diagnosis not present

## 2019-12-23 DIAGNOSIS — Z6839 Body mass index (BMI) 39.0-39.9, adult: Secondary | ICD-10-CM

## 2019-12-23 DIAGNOSIS — F3170 Bipolar disorder, currently in remission, most recent episode unspecified: Secondary | ICD-10-CM

## 2019-12-23 MED ORDER — SAXENDA 18 MG/3ML ~~LOC~~ SOPN: 3.0000 mg | PEN_INJECTOR | Freq: Every day | SUBCUTANEOUS | 2 refills | Status: DC

## 2019-12-23 MED ORDER — ALBUTEROL SULFATE HFA 108 (90 BASE) MCG/ACT IN AERS: 2.0000 | INHALATION_SPRAY | Freq: Four times a day (QID) | RESPIRATORY_TRACT | 0 refills | Status: DC | PRN

## 2019-12-23 MED ORDER — BUPROPION HCL ER (XL) 300 MG PO TB24: 300.0000 mg | ORAL_TABLET | Freq: Every day | ORAL | 3 refills | Status: DC

## 2019-12-23 NOTE — Assessment & Plan Note (Signed)
Excellent benefit so far with saxenda and calorie counting. Continue current regimen

## 2019-12-23 NOTE — Progress Notes (Signed)
BP 117/80   Pulse 71   Temp 98.1 F (36.7 C) (Oral)   Wt 253 lb (114.8 kg)   LMP 06/01/2015 (Within Days)   SpO2 98%   BMI 38.47 kg/m    Subjective:    Patient ID: Emily [name], female    DOB: [birth year], 39 y.o.   MRN: [id number]  HPI: Emily [name] is a 39 y.o. female  Chief Complaint  Patient presents with  . Weight Check  . Numbness   Here today for 1 month weight check after starting saxenda. Gets nauseated if not eating frequent enough or eating too much at once. Tracking calories with an app which has been super helpful. Trying to stay active in addition to diet changes.   Doing twice weekly B12 injections to help support her B12 levels and she is feeling so much better than on oral supplements. Numbness and tingling has all but gone away since starting this regimen.   Relevant past medical, surgical, family and social history reviewed and updated as indicated. Interim medical history since our last visit reviewed. Allergies and medications reviewed and updated.  Review of Systems  Per HPI unless specifically indicated above     Objective:    BP 117/80   Pulse 71   Temp 98.1 F (36.7 C) (Oral)   Wt 253 lb (114.8 kg)   LMP 06/01/2015 (Within Days)   SpO2 98%   BMI 38.47 kg/m   Wt Readings from Last 3 Encounters:  12/23/19 253 lb (114.8 kg)  12/15/19 254 lb (115.2 kg)  11/30/19 263 lb (119.3 kg)    Physical Exam Vitals and nursing note reviewed.  Constitutional:      Appearance: Normal appearance. She is not ill-appearing.  HENT:     Head: Atraumatic.  Eyes:     Extraocular Movements: Extraocular movements intact.     Conjunctiva/sclera: Conjunctivae normal.  Cardiovascular:     Rate and Rhythm: Normal rate and regular rhythm.     Heart sounds: Normal heart sounds.  Pulmonary:     Effort: Pulmonary effort is normal.     Breath sounds: Normal breath sounds.  Musculoskeletal:        General: Normal range of motion.     Cervical back: Normal  range of motion and neck supple.  Skin:    General: Skin is warm and dry.  Neurological:     Mental Status: She is alert and oriented to person, place, and time.  Psychiatric:        Mood and Affect: Mood normal.        Thought Content: Thought content normal.        Judgment: Judgment normal.     Results for orders placed or performed in visit on 12/15/19  CULTURE, URINE COMPREHENSIVE   Specimen: Urine   UR  Result Value Ref Range   Urine Culture, Comprehensive Final report    Organism ID, Bacteria Comment   Microscopic Examination   URINE  Result Value Ref Range   WBC, UA 0-5 0 - 5 /hpf   RBC 0-2 0 - 2 /hpf   Epithelial Cells (non renal) 0-10 0 - 10 /hpf   Bacteria, UA Moderate (A) None seen/Few  Urinalysis, Complete  Result Value Ref Range   Specific Gravity, UA >1.030 (H) 1.005 - 1.030   pH, UA 5.5 5.0 - 7.5   Color, UA Yellow Yellow   Appearance Ur Clear Clear   Leukocytes,UA Negative Negative   Protein,UA Negative  Negative/Trace   Glucose, UA Negative Negative   Ketones, UA Trace (A) Negative   RBC, UA Trace (A) Negative   Bilirubin, UA Negative Negative   Urobilinogen, Ur 0.2 0.2 - 1.0 mg/dL   Nitrite, UA Negative Negative   Microscopic Examination See below:       Assessment & Plan:   Problem List Items Addressed This Visit      Other   Bipolar disorder (HCC)    Stable and well controlled, continue current regimen      Relevant Medications   buPROPion (WELLBUTRIN XL) 300 MG 24 hr tablet   Vitamin B12 deficiency    Excellent benefit with B12 injections, numbness and tingling resolved. Continue current regimen      Obesity - Primary    Excellent benefit so far with saxenda and calorie counting. Continue current regimen      Relevant Medications   Liraglutide -Weight Management (SAXENDA) 18 MG/3ML SOPN       Follow up plan: Return in about 3 months (around 03/23/2020) for weight check.

## 2019-12-23 NOTE — Assessment & Plan Note (Signed)
Stable and well controlled, continue current regimen 

## 2019-12-23 NOTE — Assessment & Plan Note (Signed)
Excellent benefit with B12 injections, numbness and tingling resolved. Continue current regimen

## 2020-01-11 ENCOUNTER — Ambulatory Visit
Admission: RE | Admit: 2020-01-11 | Discharge: 2020-01-11 | Disposition: A | Discharge status: Home / Self Care | Payer: Commercial Managed Care - PPO | Source: Ambulatory Visit | Attending: Urology | Admitting: Urology

## 2020-01-11 ENCOUNTER — Other Ambulatory Visit: Payer: Self-pay

## 2020-01-11 DIAGNOSIS — N132 Hydronephrosis with renal and ureteral calculous obstruction: Secondary | ICD-10-CM

## 2020-01-19 ENCOUNTER — Other Ambulatory Visit: Payer: Self-pay

## 2020-01-19 ENCOUNTER — Ambulatory Visit (INDEPENDENT_AMBULATORY_CARE_PROVIDER_SITE_OTHER): Payer: Commercial Managed Care - PPO | Admitting: Urology

## 2020-01-19 ENCOUNTER — Encounter: Payer: Self-pay | Admitting: Urology

## 2020-01-19 VITALS — BP 132/83 | HR 93 | Ht 68.0 in | Wt 247.0 lb

## 2020-01-19 DIAGNOSIS — N132 Hydronephrosis with renal and ureteral calculous obstruction: Secondary | ICD-10-CM

## 2020-01-19 DIAGNOSIS — R310 Gross hematuria: Secondary | ICD-10-CM

## 2020-01-19 DIAGNOSIS — N201 Calculus of ureter: Secondary | ICD-10-CM | POA: Diagnosis not present

## 2020-01-19 LAB — URINALYSIS, COMPLETE
Bilirubin, UA: NEGATIVE
Glucose, UA: NEGATIVE
Ketones, UA: NEGATIVE
Leukocytes,UA: NEGATIVE
Nitrite, UA: NEGATIVE
Protein,UA: NEGATIVE
RBC, UA: NEGATIVE
Specific Gravity, UA: 1.015 (ref 1.005–1.030)
Urobilinogen, Ur: 0.2 mg/dL (ref 0.2–1.0)
pH, UA: 8 — ABNORMAL HIGH (ref 5.0–7.5)

## 2020-01-19 LAB — MICROSCOPIC EXAMINATION: RBC, Urine: NONE SEEN /hpf (ref 0–2)

## 2020-01-19 NOTE — Progress Notes (Signed)
01/19/2020 7:14 PM   Emily [name] 1981/03/17 [id number]  Referring provider: Particia Nearing, PA-C [address] [place],  [place] [address]  Chief Complaint  Patient presents with  . Follow-up    RUS result    HPI: Emily [name] is a 39 year old female with nephrolithiasis who presents today to discuss RUS results.  She was seen at the ED on 11/27/2019 for right flank and lower quadrant pain.  CT with 3 mm right distal ureteral calculus with bilateral non obstructing renal calculi.     KUB 12/15/2019 ? Of the continued presence of the distal right stone.    RUS 01/11/2020 Negative examination.  No hydronephrosis.  She states that she is having urinary frequency.  She did have an episode of what she describes as renal colic while she was on her trip at [employer].  She has not had issue after that time.  She is also noticing blood on her toilet tissue after she wipes. Patient denies any modifying or aggravating factors.  Patient denies any dysuria or suprapubic/flank pain.  Patient denies any fevers, chills, nausea or vomiting.   UA is negative.    PMH: Past Medical History:  Diagnosis Date  . Anemia   . Asthma    sports induced inhalers  . Bipolar 1 disorder (HCC)   . Conversion disorder   . Insomnia   . Migraines    migraines  . MRSA infection 2010  . Ovarian cyst    left 2.5cm  . Sleep apnea    does not use a CPAP  . Vitamin B 12 deficiency   . Vitamin D deficiency     Surgical History: Past Surgical History:  Procedure Laterality Date  . ABDOMINAL HYSTERECTOMY    . BUNIONECTOMY    . ESOPHAGOGASTRODUODENOSCOPY (EGD) WITH PROPOFOL N/A 11/03/2018   Procedure: ESOPHAGOGASTRODUODENOSCOPY (EGD) WITH PROPOFOL;  Surgeon: Wyline Mood, MD;  Location: [facility] ENDOSCOPY;  Service: Gastroenterology;  Laterality: N/A;  . HAMMER TOE SURGERY    . HAND SURGERY  2014   cyst removed  . INTRAUTERINE DEVICE (IUD) INSERTION N/A 05/09/2015   Procedure: INTRAUTERINE DEVICE (IUD)  INSERTION;  Surgeon: Hildred Laser, MD;  Location: [facility] ORS;  Service: Gynecology;  Laterality: N/A;  . LAPAROSCOPIC ASSISTED VAGINAL HYSTERECTOMY N/A 06/20/2015   Procedure: LAPAROSCOPIC ASSISTED VAGINAL HYSTERECTOMY, BILATERAL SALPINGECTOMY, CYSTOSCOPY, IUD REMOVAL ;  Surgeon: Hildred Laser, MD;  Location: [facility] ORS;  Service: Gynecology;  Laterality: N/A;  . LAPAROSCOPIC TUBAL LIGATION Bilateral 05/09/2015   Procedure: LAPAROSCOPIC TUBAL LIGATION;  Surgeon: Hildred Laser, MD;  Location: [facility] ORS;  Service: Gynecology;  Laterality: Bilateral;  . NASAL SINUS SURGERY    . TUBAL LIGATION  2007    Home Medications:  Allergies as of 01/19/2020      Reactions   Codeine Diarrhea, Nausea And Vomiting      Medication List       Accurate as of January 19, 2020 11:59 PM. If you have any questions, ask your nurse or doctor.        albuterol 108 (90 Base) MCG/ACT inhaler Commonly known as: ProAir HFA Inhale 2 puffs into the lungs every 6 (six) hours as needed for wheezing or shortness of breath.   buPROPion 300 MG 24 hr tablet Commonly known as: Wellbutrin XL Take 1 tablet (300 mg total) by mouth daily.   cyanocobalamin 1000 MCG/ML injection Commonly known as: (VITAMIN B-12) Inject 1 mL (1,000 mcg total) into the muscle every 14 (fourteen) days.  FISH OIL PO Take by mouth daily.   fluticasone 50 MCG/ACT nasal spray Commonly known as: FLONASE Place 2 sprays into both nostrils daily as needed for allergies or rhinitis.   ibuprofen 800 MG tablet Commonly known as: ADVIL Take 1 tablet (800 mg total) by mouth every 8 (eight) hours as needed.   NEEDLE (DISP) 18 G 18G X 1" Misc 1 Units by Does not apply route every 14 (fourteen) days.   Pen Needles 32G X 6 MM Misc 1 Units by Does not apply route daily.   Saxenda 18 MG/3ML Sopn Generic drug: Liraglutide -Weight Management Inject 0.5 mLs (3 mg total) into the skin daily.   tamsulosin 0.4 MG Caps capsule Commonly known as: FLOMAX Take 1  capsule (0.4 mg total) by mouth daily after supper.   VITAMIN D PO Take by mouth daily. Vitamin D3       Allergies:  Allergies  Allergen Reactions  . Codeine Diarrhea and Nausea And Vomiting    Family History: Family History  Problem Relation Age of Onset  . Bipolar disorder Mother   . Anxiety disorder Mother   . Cancer Mother        brain  . Glaucoma Mother   . Heart disease Father   . Diabetes Father   . Cancer Father   . Bipolar disorder Sister   . Schizophrenia Sister   . Cancer Maternal Grandmother        lung  . Heart disease Maternal Grandfather   . Breast cancer Brother   . Ovarian cancer Neg Hx     Social History:  reports that she quit smoking about 16 years ago. Her smoking use included cigarettes. She has a 10.00 pack-year smoking history. She has never used smokeless tobacco. She reports current alcohol use. She reports that she does not use drugs.  ROS: Pertinent ROS in HPI  Physical Exam: BP 132/83   Pulse 93   Ht 5\' 8"  (1.727 m)   Wt 247 lb (112 kg)   LMP 06/01/2015 (Within Days)   BMI 37.56 kg/m   Constitutional:  Well nourished. Alert and oriented, No acute distress. HEENT: [place] AT, mask in place.  Trachea midline. Cardiovascular: No clubbing, cyanosis, or edema. Respiratory: Normal respiratory effort, no increased work of breathing. Neurologic: Grossly intact, no focal deficits, moving all 4 extremities. Psychiatric: Normal mood and affect.   Laboratory Data: Lab Results  Component Value Date   WBC 7.4 11/27/2019   HGB 15.1 (H) 11/27/2019   HCT 45.0 11/27/2019   MCV 91.1 11/27/2019   PLT 290 11/27/2019    Lab Results  Component Value Date   CREATININE 1.06 (H) 11/27/2019    Lab Results  Component Value Date   TESTOSTERONE 28 10/02/2016    Lab Results  Component Value Date   TSH 3.320 08/04/2019       Component Value Date/Time   CHOL 181 08/04/2019 1455   HDL 42 08/04/2019 1455   CHOLHDL 5.0 (H) 07/31/2018 1014    LDLCALC 114 (H) 08/04/2019 1455    Lab Results  Component Value Date   AST 20 11/27/2019   Lab Results  Component Value Date   ALT 21 11/27/2019    Lab Results  Component Value Date   ESTRADIOL CANCELED 10/02/2016    Urinalysis Component     Latest Ref Rng & Units 01/19/2020  Specific Gravity, UA     1.005 - 1.030 1.015  pH, UA     5.0 - 7.5  8.0 (H)  Color, UA     Yellow Yellow  Appearance Ur     Clear Clear  Leukocytes,UA     Negative Negative  Protein,UA     Negative/Trace Negative  Glucose, UA     Negative Negative  Ketones, UA     Negative Negative  RBC, UA     Negative Negative  Bilirubin, UA     Negative Negative  Urobilinogen, Ur     0.2 - 1.0 mg/dL 0.2  Nitrite, UA     Negative Negative  Microscopic Examination      See below:   Component     Latest Ref Rng & Units 01/19/2020  WBC, UA     0 - 5 /hpf 0-5  RBC     0 - 2 /hpf None seen  Epithelial Cells (non renal)     0 - 10 /hpf 0-10  Bacteria, UA     None seen/Few Few   I have reviewed the labs.   Pertinent Imaging: CLINICAL DATA:  Follow-up hydronephrosis  EXAM: RENAL / URINARY TRACT ULTRASOUND COMPLETE  COMPARISON:  CT 11/27/2019  FINDINGS: Right Kidney:  Renal measurements: 9.3 x 3.9 x 3.6 cm = volume: 69 mL . Echogenicity within normal limits. No mass or hydronephrosis visualized.  Left Kidney:  Renal measurements: 10.7 x 5 x 5 cm = volume: 139 mL. Echogenicity within normal limits. No mass or hydronephrosis visualized.  Bladder:  Appears normal for degree of bladder distention.  Other:  None.  IMPRESSION: Negative examination.  No hydronephrosis.   Electronically Signed   By: Donavan Foil M.D.   On: 01/11/2020 23:36 I have independently reviewed the films and do not appreciate hydronephrosis.  Assessment & Plan:    1. Right ureteral stone - likely passed as RUS is negative  2. Right hydronephrosis - the hydronephrosis has resolved   3.  Gross hematuria -Patient with a past history of smoking -We will schedule cystoscopy as patient continues to experience hematuria with negative RUS  Return for cystoscopy for hematuria .  These notes generated with voice recognition software. I apologize for typographical errors.  Zara Council, PA-C  [facility] [address]  [place], [place] [address] [phone]

## 2020-01-21 LAB — CULTURE, URINE COMPREHENSIVE

## 2020-02-05 ENCOUNTER — Other Ambulatory Visit: Payer: Self-pay | Admitting: Urology

## 2020-02-07 ENCOUNTER — Other Ambulatory Visit: Payer: Self-pay | Admitting: Family Medicine

## 2020-02-07 NOTE — Telephone Encounter (Signed)
Requested medication (s) are due for refill today: yes  Requested medication (s) are on the active medication list: yes  Last refill:  11/25/19  Future visit scheduled: yes  Notes to clinic:  Medication not assigned to a protocol   Requested Prescriptions  Pending Prescriptions Disp Refills   cyanocobalamin (,VITAMIN B-12,) 1000 MCG/ML injection [Pharmacy Med Name: CYANOCOBALAMIN 1,000 MCG/ML] 2 mL 0    Sig: Inject 1 mL (1,000 mcg total) into the muscle every 14 (fourteen) days.      Off-Protocol Failed - 02/07/2020  8:23 AM      Failed - Medication not assigned to a protocol, review manually.      Passed - Valid encounter within last 12 months    Recent Outpatient Visits           1 month ago Class 2 obesity due to excess calories without serious comorbidity with body mass index (BMI) of 39.0 to 39.9 in adult   [facility], Salley Hews, [place]   2 months ago Vitamin D deficiency   [facility] Maurice March, [place], [place]   6 months ago Annual physical exam   [facility] [place], Jolene T, NP   11 months ago Migraine without status migrainosus, not intractable, unspecified migraine type   [facility] [place], Redge Gainer, MD   1 year ago PE (physical exam), annual   [facility] [place], Redge Gainer, MD       Future Appointments             In 1 month Maurice March [place], PA-C [facility]           Off-Protocol Failed - 02/07/2020  8:23 AM      Failed - Medication not assigned to a protocol, review manually.      Passed - Valid encounter within last 12 months    Recent Outpatient Visits           1 month ago Class 2 obesity due to excess calories without serious comorbidity with body mass index (BMI) of 39.0 to 39.9 in adult   [facility], Salley Hews, [place]   2 months ago Vitamin D deficiency   [facility] Maurice March, [place], [place]   6  months ago Annual physical exam   [facility] [place], Clyde T, NP   11 months ago Migraine without status migrainosus, not intractable, unspecified migraine type   [facility] [place], Redge Gainer, MD   1 year ago PE (physical exam), annual   [facility] Steele Sizer, MD       Future Appointments             In 1 month Maurice March, [place]ley [place], PA-C [facility], [facility]

## 2020-02-08 ENCOUNTER — Ambulatory Visit: Payer: Commercial Managed Care - PPO | Admitting: Nurse Practitioner

## 2020-02-08 ENCOUNTER — Other Ambulatory Visit: Payer: Self-pay | Admitting: Family Medicine

## 2020-02-08 NOTE — Telephone Encounter (Signed)
Routing to provider  

## 2020-02-24 ENCOUNTER — Telehealth: Payer: Self-pay | Admitting: Urology

## 2020-02-24 NOTE — Telephone Encounter (Signed)
Would call Mrs. Arvilla Market and have her reschedule her missed cystoscopic appointment with Dr. Lonna Cobb for gross hematuria?-

## 2020-02-29 ENCOUNTER — Encounter: Payer: Self-pay | Admitting: Urology

## 2020-03-23 ENCOUNTER — Other Ambulatory Visit: Payer: Self-pay

## 2020-03-23 ENCOUNTER — Encounter: Payer: Self-pay | Admitting: Family Medicine

## 2020-03-23 ENCOUNTER — Ambulatory Visit (INDEPENDENT_AMBULATORY_CARE_PROVIDER_SITE_OTHER): Payer: Commercial Managed Care - PPO | Admitting: Family Medicine

## 2020-03-23 VITALS — BP 121/83 | HR 85 | Temp 98.3°F | Wt 235.0 lb

## 2020-03-23 DIAGNOSIS — E6609 Other obesity due to excess calories: Secondary | ICD-10-CM | POA: Diagnosis not present

## 2020-03-23 DIAGNOSIS — Z6839 Body mass index (BMI) 39.0-39.9, adult: Secondary | ICD-10-CM | POA: Diagnosis not present

## 2020-03-23 MED ORDER — SAXENDA 18 MG/3ML ~~LOC~~ SOPN: 3.0000 mg | PEN_INJECTOR | Freq: Every day | SUBCUTANEOUS | 2 refills | Status: DC

## 2020-03-23 NOTE — Assessment & Plan Note (Signed)
Congratulated significant success thus far, continue current regimen and diet/exercise changes

## 2020-03-23 NOTE — Progress Notes (Signed)
BP 121/83   Pulse 85   Temp 98.3 F (36.8 C) (Oral)   Wt 235 lb (106.6 kg)   LMP 06/01/2015 (Within Days)   SpO2 98%   BMI 35.73 kg/m    Subjective:    Patient ID: Emily [name], female    DOB: 1981/09/16, 39 y.o.   MRN: [id number]  HPI: NIMO VERASTEGUI is a 39 y.o. female  Chief Complaint  Patient presents with  . Weight Check   Here today for 3 month weight f/u. Has lost about 30 lb now with the saxenda and and diet and exercise changes. Denies side effects, including nausea, vomiting, loss of appetite.   Relevant past medical, surgical, family and social history reviewed and updated as indicated. Interim medical history since our last visit reviewed. Allergies and medications reviewed and updated.  Review of Systems  Per HPI unless specifically indicated above     Objective:    BP 121/83   Pulse 85   Temp 98.3 F (36.8 C) (Oral)   Wt 235 lb (106.6 kg)   LMP 06/01/2015 (Within Days)   SpO2 98%   BMI 35.73 kg/m   Wt Readings from Last 3 Encounters:  03/23/20 235 lb (106.6 kg)  01/19/20 247 lb (112 kg)  12/23/19 253 lb (114.8 kg)    Physical Exam Vitals and nursing note reviewed.  Constitutional:      Appearance: Normal appearance. She is not ill-appearing.  HENT:     Head: Atraumatic.  Eyes:     Extraocular Movements: Extraocular movements intact.     Conjunctiva/sclera: Conjunctivae normal.  Cardiovascular:     Rate and Rhythm: Normal rate and regular rhythm.     Heart sounds: Normal heart sounds.  Pulmonary:     Effort: Pulmonary effort is normal.     Breath sounds: Normal breath sounds.  Musculoskeletal:        General: Normal range of motion.     Cervical back: Normal range of motion and neck supple.  Skin:    General: Skin is warm and dry.  Neurological:     Mental Status: She is alert and oriented to person, place, and time.  Psychiatric:        Mood and Affect: Mood normal.        Thought Content: Thought content normal.         Judgment: Judgment normal.     Results for orders placed or performed in visit on 01/19/20  CULTURE, URINE COMPREHENSIVE   Specimen: Urine   UR  Result Value Ref Range   Urine Culture, Comprehensive Final report    Organism ID, Bacteria Comment   Microscopic Examination   URINE  Result Value Ref Range   WBC, UA 0-5 0 - 5 /hpf   RBC None seen 0 - 2 /hpf   Epithelial Cells (non renal) 0-10 0 - 10 /hpf   Bacteria, UA Few None seen/Few  Urinalysis, Complete  Result Value Ref Range   Specific Gravity, UA 1.015 1.005 - 1.030   pH, UA 8.0 (H) 5.0 - 7.5   Color, UA Yellow Yellow   Appearance Ur Clear Clear   Leukocytes,UA Negative Negative   Protein,UA Negative Negative/Trace   Glucose, UA Negative Negative   Ketones, UA Negative Negative   RBC, UA Negative Negative   Bilirubin, UA Negative Negative   Urobilinogen, Ur 0.2 0.2 - 1.0 mg/dL   Nitrite, UA Negative Negative   Microscopic Examination See below:  Assessment & Plan:   Problem List Items Addressed This Visit      Other   Obesity - Primary    Congratulated significant success thus far, continue current regimen and diet/exercise changes      Relevant Medications   Liraglutide -Weight Management (SAXENDA) 18 MG/3ML SOPN       Follow up plan: Return in about 3 months (around 06/23/2020) for Weight check .

## 2020-04-08 ENCOUNTER — Encounter: Payer: Self-pay | Admitting: Family Medicine

## 2020-04-09 ENCOUNTER — Encounter: Payer: Self-pay | Admitting: Family Medicine

## 2020-04-11 ENCOUNTER — Other Ambulatory Visit: Payer: Self-pay | Admitting: Family Medicine

## 2020-04-11 ENCOUNTER — Telehealth: Payer: Self-pay

## 2020-04-11 MED ORDER — CYANOCOBALAMIN 1000 MCG/ML IJ SOLN: 1000.0000 ug | INTRAMUSCULAR | 3 refills | Status: DC

## 2020-04-11 MED ORDER — SAXENDA 18 MG/3ML ~~LOC~~ SOPN: 3.0000 mg | PEN_INJECTOR | Freq: Every day | SUBCUTANEOUS | 2 refills | Status: DC

## 2020-04-11 NOTE — Telephone Encounter (Signed)
PA for Saxenda initiated and submitted via Cover My Meds. Key: [id number]

## 2020-04-12 NOTE — Telephone Encounter (Signed)
PA approved.

## 2020-05-10 ENCOUNTER — Encounter: Payer: Self-pay | Admitting: Family Medicine

## 2020-05-11 ENCOUNTER — Other Ambulatory Visit: Payer: Self-pay | Admitting: Family Medicine

## 2020-05-11 DIAGNOSIS — Z1152 Encounter for screening for COVID-19: Secondary | ICD-10-CM

## 2020-05-12 ENCOUNTER — Other Ambulatory Visit: Payer: Self-pay

## 2020-05-12 ENCOUNTER — Other Ambulatory Visit: Payer: Self-pay | Admitting: Family Medicine

## 2020-05-12 ENCOUNTER — Other Ambulatory Visit: Payer: Commercial Managed Care - PPO

## 2020-05-12 DIAGNOSIS — Z1152 Encounter for screening for COVID-19: Secondary | ICD-10-CM

## 2020-05-13 LAB — SAR COV2 SEROLOGY (COVID19)AB(IGG),IA: DiaSorin SARS-CoV-2 Ab, IgG: POSITIVE

## 2020-05-13 LAB — SARS-COV-2 ANTIBODY, IGM: SARS-CoV-2 Antibody, IgM: NEGATIVE

## 2020-06-23 ENCOUNTER — Encounter: Payer: Self-pay | Admitting: Unknown Physician Specialty

## 2020-06-23 ENCOUNTER — Ambulatory Visit (INDEPENDENT_AMBULATORY_CARE_PROVIDER_SITE_OTHER): Payer: Commercial Managed Care - PPO | Admitting: Unknown Physician Specialty

## 2020-06-23 ENCOUNTER — Other Ambulatory Visit: Payer: Self-pay

## 2020-06-23 ENCOUNTER — Ambulatory Visit: Payer: Commercial Managed Care - PPO | Admitting: Family Medicine

## 2020-06-23 DIAGNOSIS — G4730 Sleep apnea, unspecified: Secondary | ICD-10-CM | POA: Diagnosis not present

## 2020-06-23 DIAGNOSIS — E6609 Other obesity due to excess calories: Secondary | ICD-10-CM

## 2020-06-23 DIAGNOSIS — Z6839 Body mass index (BMI) 39.0-39.9, adult: Secondary | ICD-10-CM

## 2020-06-23 DIAGNOSIS — F3170 Bipolar disorder, currently in remission, most recent episode unspecified: Secondary | ICD-10-CM

## 2020-06-23 MED ORDER — SAXENDA 18 MG/3ML ~~LOC~~ SOPN: 3.0000 mg | PEN_INJECTOR | Freq: Every day | SUBCUTANEOUS | 2 refills | Status: DC

## 2020-06-23 NOTE — Assessment & Plan Note (Signed)
Not using CPAP since weight loss.

## 2020-06-23 NOTE — Progress Notes (Signed)
BP 101/69 (BP Location: Left Arm, Patient Position: Sitting, Cuff Size: Large)   Pulse 74   Temp 97.8 F (36.6 C) (Oral)   Ht 5\' 8"  (1.727 m)   Wt 227 lb 3.2 oz (103.1 kg)   LMP 06/01/2015 (Within Days)   SpO2 98%   BMI 34.55 kg/m    Subjective:    Patient ID: 08/01/2015, female    DOB: 12-19-1980, 39 y.o.   MRN: [id number]  HPI: Emily [name] is a 39 y.o. female  Chief Complaint  Patient presents with  . Follow-up    weight   Pt is here to follow up on weight check as a f/u from 24.  She is having good success and has lot 8 pounds from last visit.  Ran a 5K and signed up for boot camp.  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    Able to track moods and just taking Wellbutrin.  Supplementing B12 and Vitamin B12.    Relevant past medical, surgical, family and social history reviewed and updated as indicated. Interim medical history since our last visit reviewed. Allergies and medications reviewed and updated.  Review of Systems  Per HPI unless specifically indicated above     Objective:    BP 101/69 (BP Location: Left Arm, Patient Position: Sitting, Cuff Size: Large)   Pulse 74   Temp 97.8 F (36.6 C) (Oral)   Ht 5\' 8"  (1.727 m)   Wt 227 lb 3.2 oz (103.1 kg)   LMP 06/01/2015 (Within Days)   SpO2 98%   BMI 34.55 kg/m   Wt Readings from Last 3 Encounters:  06/23/20 227 lb 3.2 oz (103.1 kg)  03/23/20 235 lb (106.6 kg)  01/19/20 247 lb (112 kg)    Physical Exam Constitutional:      General: She is not in acute distress.    Appearance: Normal appearance. She is well-developed.  HENT:     Head: Normocephalic and atraumatic.  Eyes:     General: Lids are normal. No scleral icterus.       Right eye: No discharge.        Left eye: No discharge.     Conjunctiva/sclera: Conjunctivae normal.  Cardiovascular:     Rate and Rhythm: Normal rate.  Pulmonary:     Effort: Pulmonary effort is normal.  Abdominal:     Palpations: There is no hepatomegaly or splenomegaly.    Musculoskeletal:        General: Normal range of motion.  Skin:    Coloration: Skin is not pale.     Findings: No rash.  Neurological:     Mental Status: She is alert and oriented to person, place, and time.  Psychiatric:        Behavior: Behavior normal.        Thought Content: Thought content normal.        Judgment: Judgment normal.     Results for orders placed or performed in visit on 05/12/20  SARS-CoV-2 Antibody, IgM  Result Value Ref Range   SARS-CoV-2 Antibody, IgM Negative Negative  SAR CoV2 Serology (COVID 19)AB(IGG)IA  Result Value Ref Range   DiaSorin SARS-CoV-2 Ab, IgG Positive Negative      Assessment & Plan:   Problem List Items Addressed This Visit      Unprioritized   Bipolar disorder (HCC)    Stable.  Taking Wellbutrin and lifestyle changes      Obesity    Continue Saxenda with good success.  Will recheck  in 3 months      Relevant Medications   Liraglutide -Weight Management (SAXENDA) 18 MG/3ML SOPN   Sleep apnea    Not using CPAP since weight loss.            Follow up plan: Return in about 3 months (around 09/22/2020).

## 2020-06-23 NOTE — Assessment & Plan Note (Signed)
Stable.  Taking Wellbutrin and lifestyle changes

## 2020-06-23 NOTE — Assessment & Plan Note (Signed)
Continue Saxenda with good success.  Will recheck in 3 months

## 2020-08-27 ENCOUNTER — Other Ambulatory Visit: Payer: Self-pay

## 2020-08-29 MED ORDER — ALBUTEROL SULFATE HFA 108 (90 BASE) MCG/ACT IN AERS: 2.0000 | INHALATION_SPRAY | Freq: Four times a day (QID) | RESPIRATORY_TRACT | 0 refills | Status: DC | PRN

## 2020-08-29 NOTE — Telephone Encounter (Signed)
Routing to provider  

## 2020-08-31 ENCOUNTER — Telehealth: Payer: Self-pay | Admitting: Unknown Physician Specialty

## 2020-08-31 ENCOUNTER — Encounter: Payer: Self-pay | Admitting: Unknown Physician Specialty

## 2020-08-31 NOTE — Telephone Encounter (Signed)
I connected by phone with Emily [name] on 08/31/2020 at 3:43 PM to discuss the potential use of a new treatment for mild to moderate COVID-19 viral infection in non-hospitalized patients.  This patient is a 39 y.o. female that meets the FDA criteria for Emergency Use Authorization of COVID monoclonal antibody casirivimab/imdevimab, bamlanivimab/eteseviamb, or sotrovimab.  Has a (+) direct SARS-CoV-2 viral test result  Has mild or moderate COVID-19   Is NOT hospitalized due to COVID-19  Is within 10 days of symptom onset  Has at least one of the high risk factor(s) for progression to severe COVID-19 and/or hospitalization as defined in EUA.  Specific high risk criteria : BMI > 25 and Chronic Lung Disease   I have spoken and communicated the following to the patient or parent/caregiver regarding COVID monoclonal antibody treatment:  1. FDA has authorized the emergency use for the treatment of mild to moderate COVID-19 in adults and pediatric patients with positive results of direct SARS-CoV-2 viral testing who are 55 years of age and older weighing at least 40 kg, and who are at high risk for progressing to severe COVID-19 and/or hospitalization.  2. The significant known and potential risks and benefits of COVID monoclonal antibody, and the extent to which such potential risks and benefits are unknown.  3. Information on available alternative treatments and the risks and benefits of those alternatives, including clinical trials.  4. Patients treated with COVID monoclonal antibody should continue to self-isolate and use infection control measures (e.g., wear mask, isolate, social distance, avoid sharing personal items, clean and disinfect "high touch" surfaces, and frequent handwashing) according to CDC guidelines.   5. The patient or parent/caregiver has the option to accept or refuse COVID monoclonal antibody treatment.  After reviewing this information with the patient, the patient has  agreed to receive one of the available covid 19 monoclonal antibodies and will be provided an appropriate fact sheet prior to infusion. [name], NP 08/31/2020 3:43 PM

## 2020-09-01 ENCOUNTER — Other Ambulatory Visit (HOSPITAL_COMMUNITY): Payer: Self-pay | Admitting: Physician Assistant

## 2020-09-01 ENCOUNTER — Ambulatory Visit (HOSPITAL_COMMUNITY)
Admission: RE | Admit: 2020-09-01 | Discharge: 2020-09-01 | Disposition: A | Discharge status: Home / Self Care | Payer: Commercial Managed Care - PPO | Source: Ambulatory Visit | Attending: Pulmonary Disease | Admitting: Pulmonary Disease

## 2020-09-01 DIAGNOSIS — U071 COVID-19: Secondary | ICD-10-CM | POA: Insufficient documentation

## 2020-09-01 DIAGNOSIS — Z6825 Body mass index (BMI) 25.0-25.9, adult: Secondary | ICD-10-CM | POA: Insufficient documentation

## 2020-09-01 MED ORDER — SODIUM CHLORIDE 0.9 % IV SOLN: INTRAVENOUS | Status: DC | PRN

## 2020-09-01 MED ORDER — SODIUM CHLORIDE 0.9 % IV SOLN
1200.0000 mg | Freq: Once | INTRAVENOUS | Status: AC
  Administered 2020-09-01: 1200 mg via INTRAVENOUS

## 2020-09-01 MED ORDER — ALBUTEROL SULFATE HFA 108 (90 BASE) MCG/ACT IN AERS: 2.0000 | INHALATION_SPRAY | Freq: Once | RESPIRATORY_TRACT | Status: DC | PRN

## 2020-09-01 MED ORDER — EPINEPHRINE 0.3 MG/0.3ML IJ SOAJ: 0.3000 mg | Freq: Once | INTRAMUSCULAR | Status: DC | PRN

## 2020-09-01 MED ORDER — FAMOTIDINE IN NACL 20-0.9 MG/50ML-% IV SOLN: 20.0000 mg | Freq: Once | INTRAVENOUS | Status: DC | PRN

## 2020-09-01 MED ORDER — DIPHENHYDRAMINE HCL 50 MG/ML IJ SOLN: 50.0000 mg | Freq: Once | INTRAMUSCULAR | Status: DC | PRN

## 2020-09-01 MED ORDER — METHYLPREDNISOLONE SODIUM SUCC 125 MG IJ SOLR: 125.0000 mg | Freq: Once | INTRAMUSCULAR | Status: DC | PRN

## 2020-09-01 NOTE — Progress Notes (Addendum)
  Diagnosis: COVID-19  Physician: Dr. Wright  Procedure: Covid Infusion Clinic Med: casirivimab\imdevimab infusion - Provided patient with casirivimab\imdevimab fact sheet for patients, parents and caregivers prior to infusion.  Complications: No immediate complications noted.  Discharge: Discharged home   Emily [name] 09/01/2020  

## 2020-09-01 NOTE — Progress Notes (Signed)
Patient reviewed Fact Sheet for Patients, Parents, and Caregivers for Emergency Use Authorization (EUA) of REGEN-COV for the Treatment of Coronavirus. Patient also reviewed and is agreeable to the estimated cost of treatment. Patient is agreeable to proceed.    

## 2020-09-01 NOTE — Discharge Instructions (Signed)
10 Things You Can Do to Manage Your COVID-19 Symptoms at Home If you have possible or confirmed COVID-19: 1. Stay home from work and school. And stay away from other public places. If you must go out, avoid using any kind of public transportation, ridesharing, or taxis. 2. Monitor your symptoms carefully. If your symptoms get worse, call your healthcare provider immediately. 3. Get rest and stay hydrated. 4. If you have a medical appointment, call the healthcare provider ahead of time and tell them that you have or may have COVID-19. 5. For medical emergencies, call 911 and notify the dispatch personnel that you have or may have COVID-19. 6. Cover your cough and sneezes with a tissue or use the inside of your elbow. 7. Wash your hands often with soap and water for at least 20 seconds or clean your hands with an alcohol-based hand sanitizer that contains at least 60% alcohol. 8. As much as possible, stay in a specific room and away from other people in your home. Also, you should use a separate bathroom, if available. If you need to be around other people in or outside of the home, wear a mask. 9. Avoid sharing personal items with other people in your household, like dishes, towels, and bedding. 10. Clean all surfaces that are touched often, like counters, tabletops, and doorknobs. Use household cleaning sprays or wipes according to the label instructions. cdc.gov/coronavirus 03/25/2019 This information is not intended to replace advice given to you by your health care provider. Make sure you discuss any questions you have with your health care provider. Document Revised: 08/27/2019 Document Reviewed: 08/27/2019 Elsevier Patient Education  2020 Elsevier Inc. What types of side effects do monoclonal antibody drugs cause?  Common side effects  In general, the more common side effects caused by monoclonal antibody drugs include: . Allergic reactions, such as hives or itching . Flu-like signs and  symptoms, including chills, fatigue, fever, and muscle aches and pains . Nausea, vomiting . Diarrhea . Skin rashes . Low blood pressure   The CDC is recommending patients who receive monoclonal antibody treatments wait at least 90 days before being vaccinated.  Currently, there are no data on the safety and efficacy of mRNA COVID-19 vaccines in persons who received monoclonal antibodies or convalescent plasma as part of COVID-19 treatment. Based on the estimated half-life of such therapies as well as evidence suggesting that reinfection is uncommon in the 90 days after initial infection, vaccination should be deferred for at least 90 days, as a precautionary measure until additional information becomes available, to avoid interference of the antibody treatment with vaccine-induced immune responses. If you have any questions or concerns after the infusion please call the Advanced Practice Provider on call at [phone]. This number is ONLY intended for your use regarding questions or concerns about the infusion post-treatment side-effects.  Please do not provide this number to others for use. For return to work notes please contact your primary care provider.   If someone you know is interested in receiving treatment please have them call the COVID hotline at [phone].   

## 2020-09-22 ENCOUNTER — Encounter: Payer: Self-pay | Admitting: Family Medicine

## 2020-09-22 ENCOUNTER — Ambulatory Visit (INDEPENDENT_AMBULATORY_CARE_PROVIDER_SITE_OTHER): Payer: Commercial Managed Care - PPO | Admitting: Family Medicine

## 2020-09-22 ENCOUNTER — Ambulatory Visit: Payer: Commercial Managed Care - PPO | Admitting: Unknown Physician Specialty

## 2020-09-22 ENCOUNTER — Other Ambulatory Visit: Payer: Self-pay

## 2020-09-22 VITALS — BP 112/77 | HR 73 | Temp 98.0°F | Ht 67.21 in | Wt 219.1 lb

## 2020-09-22 DIAGNOSIS — J4521 Mild intermittent asthma with (acute) exacerbation: Secondary | ICD-10-CM

## 2020-09-22 DIAGNOSIS — F3170 Bipolar disorder, currently in remission, most recent episode unspecified: Secondary | ICD-10-CM | POA: Diagnosis not present

## 2020-09-22 DIAGNOSIS — E6609 Other obesity due to excess calories: Secondary | ICD-10-CM

## 2020-09-22 DIAGNOSIS — Z6839 Body mass index (BMI) 39.0-39.9, adult: Secondary | ICD-10-CM | POA: Diagnosis not present

## 2020-09-22 MED ORDER — FLOVENT HFA 110 MCG/ACT IN AERO: 1.0000 | INHALATION_SPRAY | Freq: Every day | RESPIRATORY_TRACT | 2 refills | Status: DC

## 2020-09-22 MED ORDER — SAXENDA 18 MG/3ML ~~LOC~~ SOPN
3.0000 mg | PEN_INJECTOR | Freq: Every day | SUBCUTANEOUS | 2 refills | Status: DC
Start: 2020-09-22 — End: 2020-12-19

## 2020-09-22 MED ORDER — ALBUTEROL SULFATE HFA 108 (90 BASE) MCG/ACT IN AERS: 2.0000 | INHALATION_SPRAY | Freq: Four times a day (QID) | RESPIRATORY_TRACT | 3 refills | Status: DC | PRN

## 2020-09-22 NOTE — Assessment & Plan Note (Signed)
Doing well despite recent stressors. Continue current regimen.

## 2020-09-22 NOTE — Assessment & Plan Note (Signed)
Worsened since recent COVID. Will trial controller inhaler. F/u in 4 weeks.

## 2020-09-22 NOTE — Assessment & Plan Note (Addendum)
Doing well, continues to lose weight, down 8lbs since last visit. Continue saxenda and lifestyle changes. Refill provided.

## 2020-09-22 NOTE — Patient Instructions (Signed)
It was great to see you!  Our plans for today:  - Take the flovent inhaler 1 puff every day.  - Try miralax for your constipation. Take one capful per day, you can increase this if you are not seeing effect after a few days.  - Follow up one month for your breathing.   Take care and seek immediate care sooner if you develop any concerns.   Dr. Linwood Dibbles

## 2020-09-22 NOTE — Progress Notes (Signed)
    SUBJECTIVE:   CHIEF COMPLAINT / HPI:   OBESITY - taking saxenda, tolerating ok - down 8 lbs - Diet: 4 smaller meals per day.  - Exercise: not very active lately due to recent COVID. Did a boot camp at gym, signed up for next week. 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 No wheezing but SOB with exercise and a lot of coughing at night, waking up most nights due to coughing.  - albuterol helps, using 5x per day.  Anxiety - Medications: wellbutrin 300mg  daily - Counseling: no - Symptoms: none - Current stressors: recent COVID infection, mom passed in October but feels she is handling this well. - Coping Mechanisms: reads, walks around farm   OBJECTIVE:   BP 112/77   Pulse 73   Temp 98 F (36.7 C)   Ht 5' 7.21" (1.707 m)   Wt 219 lb 2 oz (99.4 kg)   LMP 06/01/2015 (Within Days)   SpO2 98%   BMI 34.11 kg/m   Gen: overweight, in NAD Card: RRR Lungs: CTAB, no wheezing/rales. Comfortable WOB on RA. Ext: WWP, no edema Psych: well groomed and dressed. Pleasant mood.  ASSESSMENT/PLAN:   Obesity Doing well, continues to lose weight, down 8lbs since last visit. Continue saxenda and lifestyle changes. Refill provided.  Bipolar disorder Doing well despite recent stressors. Continue current regimen.  Asthma Worsened since recent COVID. Will trial controller inhaler. F/u in 4 weeks.     08/01/2015, DO

## 2020-10-16 ENCOUNTER — Telehealth: Payer: Commercial Managed Care - PPO | Admitting: Family

## 2020-10-16 DIAGNOSIS — M5440 Lumbago with sciatica, unspecified side: Secondary | ICD-10-CM

## 2020-10-16 MED ORDER — IBUPROFEN 800 MG PO TABS
800.0000 mg | ORAL_TABLET | Freq: Three times a day (TID) | ORAL | 0 refills | Status: DC | PRN
Start: 2020-10-16 — End: 2022-07-27

## 2020-10-16 MED ORDER — CYCLOBENZAPRINE HCL 10 MG PO TABS: 10.0000 mg | ORAL_TABLET | Freq: Three times a day (TID) | ORAL | 0 refills | Status: DC | PRN

## 2020-10-16 MED ORDER — NAPROXEN 500 MG PO TABS: 500.0000 mg | ORAL_TABLET | Freq: Two times a day (BID) | ORAL | 0 refills | Status: DC

## 2020-10-16 NOTE — Addendum Note (Signed)
Addended by: Worthy Rancher B on: 10/16/2020 01:32 PM   Modules accepted: Orders

## 2020-10-16 NOTE — Progress Notes (Signed)

## 2020-10-21 ENCOUNTER — Encounter: Payer: Self-pay | Admitting: Nurse Practitioner

## 2020-10-21 ENCOUNTER — Other Ambulatory Visit: Payer: Self-pay

## 2020-10-21 ENCOUNTER — Ambulatory Visit (INDEPENDENT_AMBULATORY_CARE_PROVIDER_SITE_OTHER): Payer: Commercial Managed Care - PPO | Admitting: Nurse Practitioner

## 2020-10-21 VITALS — BP 107/73 | HR 77 | Temp 98.1°F | Wt 224.0 lb

## 2020-10-21 DIAGNOSIS — F419 Anxiety disorder, unspecified: Secondary | ICD-10-CM | POA: Diagnosis not present

## 2020-10-21 DIAGNOSIS — E66812 Obesity, class 2: Secondary | ICD-10-CM

## 2020-10-21 DIAGNOSIS — E6609 Other obesity due to excess calories: Secondary | ICD-10-CM

## 2020-10-21 DIAGNOSIS — R0602 Shortness of breath: Secondary | ICD-10-CM

## 2020-10-21 DIAGNOSIS — Z6839 Body mass index (BMI) 39.0-39.9, adult: Secondary | ICD-10-CM

## 2020-10-21 DIAGNOSIS — J4521 Mild intermittent asthma with (acute) exacerbation: Secondary | ICD-10-CM

## 2020-10-21 DIAGNOSIS — Z23 Encounter for immunization: Secondary | ICD-10-CM

## 2020-10-21 MED ORDER — METHYLPREDNISOLONE 4 MG PO TBPK: ORAL_TABLET | ORAL | 0 refills | Status: DC

## 2020-10-21 NOTE — Progress Notes (Addendum)
BP 107/73   Pulse 77   Temp 98.1 F (36.7 C) (Oral)   Wt 224 lb (101.6 kg)   LMP 06/01/2015 (Within Days)   SpO2 100%   BMI 34.87 kg/m    Subjective:    Patient ID: Emily [name], female    DOB: 04/12/1981, 40 y.o.   MRN: [id number]  HPI: Emily [name] is a 40 y.o. female  Chief Complaint  Patient presents with  . Weight Check    4 week follow up  . Back Pain    Pt states she has been having low back pain for the last week. States she has been to the chiropractor and did an E visit. Was given 800 mg Ibuprofen and Flexeril. Rates pain at an 8 today.    Patient states she has had worsening SOB since her last visit.  The Flovent did help her symptoms but caused her weight gain so she stopped the medication.  Patient is slowly getting her sense of taste and smell back.    Obesity Duration: Ongoing.  Patient has been on Saxenda since March 2021 and has lost a total of 40lbs.  However, she gained 7lbs while on the Flovent due to SOB after COVID.  Since patient stopped Flovent two weeks ago she has lost 2lbs. Previous attempts at weight loss: yes Complications of obesity:  OSA, SOB Requesting obesity pharmacotherapy: currently on saxenda and having good success. Current weight loss supplements/medications: no Previous weight loss supplements/meds: no Calories: 1500-1700 Patient was previously having SOB with exercise and a lot of coughing at night following her COVID 19 infection. Patient states she continues to have issues with breathing.  Patient has stopped the Flovent since her last visit due to it causing her to gain about 7 lbs.  Patient stopped it after 2 weeks and has since lost 2 lbs.  Anxiety - Medications: wellbutrin 300mg  daily - Counseling: no - Symptoms: none - Current stressors:Weight gain - Coping Mechanisms: reads, walks around farm Relevant past medical, surgical, family and social history reviewed and updated as indicated. Interim medical history since our  last visit reviewed. Allergies and medications reviewed and updated.    Review of Systems  Respiratory: Positive for shortness of breath. Negative for chest tightness.   Psychiatric/Behavioral: Negative for dysphoric mood. The patient is not nervous/anxious.     Per HPI unless specifically indicated above     Objective:    BP 107/73   Pulse 77   Temp 98.1 F (36.7 C) (Oral)   Wt 224 lb (101.6 kg)   LMP 06/01/2015 (Within Days)   SpO2 100%   BMI 34.87 kg/m   Wt Readings from Last 3 Encounters:  10/21/20 224 lb (101.6 kg)  09/22/20 219 lb 2 oz (99.4 kg)  06/23/20 227 lb 3.2 oz (103.1 kg)    Physical Exam Vitals and nursing note reviewed.  Constitutional:      General: She is not in acute distress.    Appearance: Normal appearance. She is obese. She is not ill-appearing, toxic-appearing or diaphoretic.  HENT:     Head: Normocephalic.     Right Ear: External ear normal.     Left Ear: External ear normal.     Nose: Nose normal.     Mouth/Throat:     Mouth: Mucous membranes are moist.     Pharynx: Oropharynx is clear.  Eyes:     General:        Right eye: No discharge.  Left eye: No discharge.     Extraocular Movements: Extraocular movements intact.     Conjunctiva/sclera: Conjunctivae normal.     Pupils: Pupils are equal, round, and reactive to light.  Cardiovascular:     Rate and Rhythm: Normal rate and regular rhythm.     Heart sounds: No murmur heard.   Pulmonary:     Effort: Pulmonary effort is normal. No respiratory distress.     Breath sounds: Normal breath sounds. No wheezing or rales.  Musculoskeletal:     Cervical back: Normal range of motion and neck supple.  Skin:    General: Skin is warm and dry.     Capillary Refill: Capillary refill takes less than 2 seconds.  Neurological:     General: No focal deficit present.     Mental Status: She is alert and oriented to person, place, and time. Mental status is at baseline.  Psychiatric:         Mood and Affect: Mood normal.        Behavior: Behavior normal.        Thought Content: Thought content normal.        Judgment: Judgment normal.     Results for orders placed or performed in visit on 05/12/20  SARS-CoV-2 Antibody, IgM  Result Value Ref Range   SARS-CoV-2 Antibody, IgM Negative Negative  SAR CoV2 Serology (COVID 19)AB(IGG)IA  Result Value Ref Range   DiaSorin SARS-CoV-2 Ab, IgG Positive Negative      Assessment & Plan:   Problem List Items Addressed This Visit      Respiratory   Asthma    Ongoing.  Complete course of steroids.  Will obtain chest xray if symptoms worsen.  Return to clinic in 2 months for reevaluation.      Relevant Medications   methylPREDNISolone (MEDROL DOSEPAK) 4 MG TBPK tablet     Other   Obesity - Primary    Ongoing.  Continue with Saxenda.  If patient does not have continued success with medication will discuss changing therapy.       Other Visit Diagnoses    Anxiety       Stable. Well controlled on current medication regimen.    Need for influenza vaccination       Patient tolerated injection well.    Shortness of breath       Ongoing.  Complete course of steroids.  Will obtain chest xray if symptoms worsen.  Return to clinic in 2 months for reevaluation.   Relevant Medications   methylPREDNISolone (MEDROL DOSEPAK) 4 MG TBPK tablet       Follow up plan: Return in about 2 months (around 12/19/2020) for Weight and breathing follow up.

## 2020-10-27 NOTE — Assessment & Plan Note (Signed)
Ongoing.  Continue with Saxenda.  If patient does not have continued success with medication will discuss changing therapy.

## 2020-10-27 NOTE — Assessment & Plan Note (Signed)
Ongoing.  Complete course of steroids.  Will obtain chest xray if symptoms worsen.  Return to clinic in 2 months for reevaluation.

## 2020-12-16 NOTE — Progress Notes (Unsigned)
BP 109/78   Pulse 90   Temp 99.6 F (37.6 C)   Ht 5' 7.72" (1.72 m)   Wt 224 lb 2 oz (101.7 kg)   LMP 06/01/2015 (Within Days)   SpO2 98%   BMI 34.36 kg/m    Subjective:    Patient ID: Emily [name], female    DOB: 06-Dec-1980, 40 y.o.   MRN: [id number]  HPI: Emily [name] is a 40 y.o. female  Chief Complaint  Patient presents with  . Weight Check  . Asthma   WEIGHT GAIN Duration: Lifelong Previous attempts at weight loss: yes patient previously on Saxenda and had good success.  Patient has since plateaued on medication.  Would like to try something different.  Continues to eat well and exercise.  However, exercise is difficult right now due to the shortness of breath post covid.  SHORTNESS OF BREATH Patient continues to have SOB.  Patient states that the steroids helped for about a week after completing the course then returned.  Patient feels like something is on her chest at times.  Patient did a 5k on over the weekend and had two asthma attacks.  She does realize the weather was not ideal for patient's with asthma.  Relevant past medical, surgical, family and social history reviewed and updated as indicated. Interim medical history since our last visit reviewed. Allergies and medications reviewed and updated.  Review of Systems  Constitutional: Positive for unexpected weight change.  Respiratory: Positive for shortness of breath. Negative for cough.   Cardiovascular: Negative for chest pain.    Per HPI unless specifically indicated above     Objective:    BP 109/78   Pulse 90   Temp 99.6 F (37.6 C)   Ht 5' 7.72" (1.72 m)   Wt 224 lb 2 oz (101.7 kg)   LMP 06/01/2015 (Within Days)   SpO2 98%   BMI 34.36 kg/m   Wt Readings from Last 3 Encounters:  12/19/20 224 lb 2 oz (101.7 kg)  10/21/20 224 lb (101.6 kg)  09/22/20 219 lb 2 oz (99.4 kg)    Physical Exam Vitals and nursing note reviewed.  Constitutional:      General: She is not in acute distress.     Appearance: Normal appearance. She is normal weight. She is not ill-appearing, toxic-appearing or diaphoretic.  HENT:     Head: Normocephalic.     Right Ear: External ear normal.     Left Ear: External ear normal.     Nose: Nose normal.     Mouth/Throat:     Mouth: Mucous membranes are moist.     Pharynx: Oropharynx is clear.  Eyes:     General:        Right eye: No discharge.        Left eye: No discharge.     Extraocular Movements: Extraocular movements intact.     Conjunctiva/sclera: Conjunctivae normal.     Pupils: Pupils are equal, round, and reactive to light.  Cardiovascular:     Rate and Rhythm: Normal rate and regular rhythm.     Heart sounds: No murmur heard.   Pulmonary:     Effort: Pulmonary effort is normal. No respiratory distress.     Breath sounds: Normal breath sounds. No wheezing or rales.  Musculoskeletal:     Cervical back: Normal range of motion and neck supple.  Skin:    General: Skin is warm and dry.     Capillary Refill: Capillary refill  takes less than 2 seconds.  Neurological:     General: No focal deficit present.     Mental Status: She is alert and oriented to person, place, and time. Mental status is at baseline.  Psychiatric:        Mood and Affect: Mood normal.        Behavior: Behavior normal.        Thought Content: Thought content normal.        Judgment: Judgment normal.     Results for orders placed or performed in visit on 05/12/20  SARS-CoV-2 Antibody, IgM  Result Value Ref Range   SARS-CoV-2 Antibody, IgM Negative Negative  SAR CoV2 Serology (COVID 19)AB(IGG)IA  Result Value Ref Range   DiaSorin SARS-CoV-2 Ab, IgG Positive Negative      Assessment & Plan:   Problem List Items Addressed This Visit      Other   Vitamin B12 deficiency    Excellent benefit with B12 injections, numbness and tingling resolved. Continue current regimen.  Labs ordered today.       Relevant Orders   B12   Vitamin D deficiency    Labs ordered  today.  Will adjust medications accordingly.      Relevant Orders   Vitamin D (25 hydroxy)   Obesity - Primary    Begin Contrave.  Discussed with patient how to use medication appropriately.  Discussed with patient the need to wean down on Wellbutrin as the Contrave is increased. Patient will continue with current dose of Wellbutrin on week 1, week two will take 132m of Wellbutrin with Contrave, and week 3 Wellbutrin will be stopped.  Discussed side effects and benefits of medication with patient during visit. Patient instructed to RTC sooner if she has a change in her mental health.  Patient understands and agrees with the plan.  Follow up in 1 month.      Relevant Medications   Naltrexone-buPROPion HCl ER (CONTRAVE) 8-90 MG TB12   Other Relevant Orders   Comp Met (CMET)   TSH    Other Visit Diagnoses    Shortness of breath       Will refer patient to COVID long haul clinic due to ongoing SOB that has not been helped by steroids or inhalers.    Relevant Orders   CBC w/Diff       Follow up plan: Return in about 1 month (around 01/19/2021) for Weight Managment .

## 2020-12-19 ENCOUNTER — Telehealth: Payer: Self-pay | Admitting: Nurse Practitioner

## 2020-12-19 ENCOUNTER — Ambulatory Visit (INDEPENDENT_AMBULATORY_CARE_PROVIDER_SITE_OTHER): Payer: Commercial Managed Care - PPO | Admitting: Nurse Practitioner

## 2020-12-19 ENCOUNTER — Other Ambulatory Visit: Payer: Self-pay

## 2020-12-19 ENCOUNTER — Encounter: Payer: Self-pay | Admitting: Nurse Practitioner

## 2020-12-19 VITALS — BP 109/78 | HR 90 | Temp 99.6°F | Ht 67.72 in | Wt 224.1 lb

## 2020-12-19 DIAGNOSIS — E559 Vitamin D deficiency, unspecified: Secondary | ICD-10-CM

## 2020-12-19 DIAGNOSIS — E6609 Other obesity due to excess calories: Secondary | ICD-10-CM

## 2020-12-19 DIAGNOSIS — R0602 Shortness of breath: Secondary | ICD-10-CM

## 2020-12-19 DIAGNOSIS — E538 Deficiency of other specified B group vitamins: Secondary | ICD-10-CM

## 2020-12-19 DIAGNOSIS — Z6839 Body mass index (BMI) 39.0-39.9, adult: Secondary | ICD-10-CM

## 2020-12-19 MED ORDER — BUPROPION HCL ER (XL) 150 MG PO TB24: 150.0000 mg | ORAL_TABLET | Freq: Every day | ORAL | 0 refills | Status: DC

## 2020-12-19 MED ORDER — CONTRAVE 8-90 MG PO TB12: ORAL_TABLET | ORAL | 0 refills | Status: DC

## 2020-12-19 NOTE — Telephone Encounter (Signed)
These are covered medications, QSYMIA, SAXENDA, WEGOVY Can she be switched to one of these or is this a continuation of therapy.

## 2020-12-19 NOTE — Assessment & Plan Note (Signed)
Begin Contrave.  Discussed with patient how to use medication appropriately.  Discussed with patient the need to wean down on Wellbutrin as the Contrave is increased. Patient will continue with current dose of Wellbutrin on week 1, week two will take 150mg  of Wellbutrin with Contrave, and week 3 Wellbutrin will be stopped.  Discussed side effects and benefits of medication with patient during visit. Patient instructed to RTC sooner if she has a change in her mental health.  Patient understands and agrees with the plan.  Follow up in 1 month.

## 2020-12-19 NOTE — Telephone Encounter (Signed)
Patient has already done saxenda and Reginal Lutes is the same thing just pill form.  We don't do Qsymia at this clinic.  If we can't do the contrave, as patient if she is will to see a weight loss clinic to discuss Qsymia (Phentermine).

## 2020-12-19 NOTE — Telephone Encounter (Signed)
Pt called and stated she was advise by the pharmacy that the Rx for Naltrexone-buPROPion HCl ER (CONTRAVE) 8-90 MG TB12 Is not covered by her insurance / Pt asked if there is an alternate or what her provider wants her to do/ please advise

## 2020-12-19 NOTE — Assessment & Plan Note (Signed)
Excellent benefit with B12 injections, numbness and tingling resolved. Continue current regimen.  Labs ordered today.

## 2020-12-19 NOTE — Telephone Encounter (Signed)
PA submitted via cover my meds fr Contrave. Awaiting approval or denial.  Key [id number]

## 2020-12-19 NOTE — Telephone Encounter (Signed)
I will see if we can do a Prior authorization for the medication.  If not, I will look into alternatives.    Can we call the pharmacy and see if we can do a prior auth for the medication?

## 2020-12-19 NOTE — Assessment & Plan Note (Signed)
Labs ordered today.  Will adjust medications accordingly.

## 2020-12-20 LAB — COMPREHENSIVE METABOLIC PANEL
ALT: 17 IU/L (ref 0–32)
AST: 23 IU/L (ref 0–40)
Albumin/Globulin Ratio: 1.7 (ref 1.2–2.2)
Albumin: 4.3 g/dL (ref 3.8–4.8)
Alkaline Phosphatase: 93 IU/L (ref 44–121)
BUN/Creatinine Ratio: 8 — ABNORMAL LOW (ref 9–23)
BUN: 10 mg/dL (ref 6–24)
Bilirubin Total: 0.9 mg/dL (ref 0.0–1.2)
CO2: 23 mmol/L (ref 20–29)
Calcium: 9.6 mg/dL (ref 8.7–10.2)
Chloride: 104 mmol/L (ref 96–106)
Creatinine, Ser: 1.21 mg/dL — ABNORMAL HIGH (ref 0.57–1.00)
Globulin, Total: 2.6 g/dL (ref 1.5–4.5)
Glucose: 80 mg/dL (ref 65–99)
Potassium: 5.1 mmol/L (ref 3.5–5.2)
Sodium: 142 mmol/L (ref 134–144)
Total Protein: 6.9 g/dL (ref 6.0–8.5)
eGFR: 58 mL/min/{1.73_m2} — ABNORMAL LOW (ref >59)

## 2020-12-20 LAB — CBC WITH DIFFERENTIAL/PLATELET
Basophils Absolute: 0 10*3/uL (ref 0.0–0.2)
Basos: 0 %
EOS (ABSOLUTE): 0 10*3/uL (ref 0.0–0.4)
Eos: 0 %
Hematocrit: 44.1 % (ref 34.0–46.6)
Hemoglobin: 14.6 g/dL (ref 11.1–15.9)
Immature Grans (Abs): 0 10*3/uL (ref 0.0–0.1)
Immature Granulocytes: 0 %
Lymphocytes Absolute: 1.4 10*3/uL (ref 0.7–3.1)
Lymphs: 19 %
MCH: 30.1 pg (ref 26.6–33.0)
MCHC: 33.1 g/dL (ref 31.5–35.7)
MCV: 91 fL (ref 79–97)
Monocytes Absolute: 0.6 10*3/uL (ref 0.1–0.9)
Monocytes: 8 %
Neutrophils Absolute: 5.2 10*3/uL (ref 1.4–7.0)
Neutrophils: 73 %
Platelets: 253 10*3/uL (ref 150–450)
RBC: 4.85 x10E6/uL (ref 3.77–5.28)
RDW: 12.3 % (ref 11.7–15.4)
WBC: 7.3 10*3/uL (ref 3.4–10.8)

## 2020-12-20 LAB — TSH: TSH: 2.22 u[IU]/mL (ref 0.450–4.500)

## 2020-12-20 LAB — VITAMIN B12: Vitamin B-12: 1379 pg/mL — ABNORMAL HIGH (ref 232–1245)

## 2020-12-20 LAB — VITAMIN D 25 HYDROXY (VIT D DEFICIENCY, FRACTURES): Vit D, 25-Hydroxy: 72.3 ng/mL (ref 30.0–100.0)

## 2020-12-20 NOTE — Progress Notes (Signed)
Hi Emily [name].  Overall your lab work looks good.  Your Kidney function declined slightly from before.  I would like to repeat this at your one month follow up in hopes that it returns to normal.  Make sure you are well hydrated when you come in.  Your Vitamin D and B12 look good.  Thyroid labs are also within normal range.  I am still working on your medication.  We will be in touch regarding that.  See you soon.

## 2020-12-29 NOTE — Telephone Encounter (Signed)
Can we please print out the letter, and my last note and fax to the denial number on the paperwork.

## 2021-01-02 MED ORDER — BUPROPION HCL ER (XL) 300 MG PO TB24: 300.0000 mg | ORAL_TABLET | Freq: Every day | ORAL | 1 refills | Status: DC

## 2021-01-02 MED ORDER — NALTREXONE HCL 50 MG PO TABS: 50.0000 mg | ORAL_TABLET | Freq: Every day | ORAL | 0 refills | Status: DC

## 2021-01-13 NOTE — Progress Notes (Signed)
BP 128/82   Pulse 89   Temp 97.8 F (36.6 C)   Wt 231 lb 8 oz (105 kg)   LMP 06/01/2015 (Within Days)   SpO2 96%   BMI 35.49 kg/m    Subjective:    Patient ID: Emily [name], female    DOB: [birth year], 39 y.o.   MRN: [id number]  HPI: Emily [name] is a 40 y.o. female  Chief Complaint  Patient presents with  . Weight Check   WEIGHT MANAGEMENT Patient was able to pick up the naltrexone however she experienced a lot of fatigue when she took it for a couple of days and had a lot of fatigue.  She stopped the medication because she was also taking her allergy medication at the same time which causes her a lot of fatigue as well.  Patient plans to restart the medication on Saturday and refocus her weight loss journey.   SHORTNESS OF BREATH Patient states the SOB has not improved.  She has not made an appointment with the [place] clinic yet but plans to today.    Denies HA, CP, dizziness, palpitations, visual changes, and lower extremity swelling.  Relevant past medical, surgical, family and social history reviewed and updated as indicated. Interim medical history since our last visit reviewed. Allergies and medications reviewed and updated.  Review of Systems  Constitutional: Positive for unexpected weight change.  Eyes: Negative for visual disturbance.  Respiratory: Positive for shortness of breath. Negative for cough and chest tightness.   Cardiovascular: Negative for chest pain, palpitations and leg swelling.  Neurological: Negative for dizziness and headaches.    Per HPI unless specifically indicated above     Objective:    BP 128/82   Pulse 89   Temp 97.8 F (36.6 C)   Wt 231 lb 8 oz (105 kg)   LMP 06/01/2015 (Within Days)   SpO2 96%   BMI 35.49 kg/m   Wt Readings from Last 3 Encounters:  01/16/21 231 lb 8 oz (105 kg)  12/19/20 224 lb 2 oz (101.7 kg)  10/21/20 224 lb (101.6 kg)    Physical Exam Vitals and nursing note reviewed.  Constitutional:       General: She is not in acute distress.    Appearance: Normal appearance. She is normal weight. She is not ill-appearing, toxic-appearing or diaphoretic.  HENT:     Head: Normocephalic.     Right Ear: External ear normal.     Left Ear: External ear normal.     Nose: Nose normal.     Mouth/Throat:     Mouth: Mucous membranes are moist.     Pharynx: Oropharynx is clear.  Eyes:     General:        Right eye: No discharge.        Left eye: No discharge.     Extraocular Movements: Extraocular movements intact.     Conjunctiva/sclera: Conjunctivae normal.     Pupils: Pupils are equal, round, and reactive to light.  Cardiovascular:     Rate and Rhythm: Normal rate and regular rhythm.     Heart sounds: No murmur heard.   Pulmonary:     Effort: Pulmonary effort is normal. No respiratory distress.     Breath sounds: Normal breath sounds. No wheezing or rales.  Musculoskeletal:     Cervical back: Normal range of motion and neck supple.  Skin:    General: Skin is warm and dry.     Capillary Refill:  Capillary refill takes less than 2 seconds.  Neurological:     General: No focal deficit present.     Mental Status: She is alert and oriented to person, place, and time. Mental status is at baseline.  Psychiatric:        Mood and Affect: Mood normal.        Behavior: Behavior normal.        Thought Content: Thought content normal.        Judgment: Judgment normal.     Results for orders placed or performed in visit on 12/19/20  Comp Met (CMET)  Result Value Ref Range   Glucose 80 65 - 99 mg/dL   BUN 10 6 - 24 mg/dL   Creatinine, Ser 1.21 (H) 0.57 - 1.00 mg/dL   eGFR 58 (L) >59 mL/min/1.73   BUN/Creatinine Ratio 8 (L) 9 - 23   Sodium 142 134 - 144 mmol/L   Potassium 5.1 3.5 - 5.2 mmol/L   Chloride 104 96 - 106 mmol/L   CO2 23 20 - 29 mmol/L   Calcium 9.6 8.7 - 10.2 mg/dL   Total Protein 6.9 6.0 - 8.5 g/dL   Albumin 4.3 3.8 - 4.8 g/dL   Globulin, Total 2.6 1.5 - 4.5 g/dL    Albumin/Globulin Ratio 1.7 1.2 - 2.2   Bilirubin Total 0.9 0.0 - 1.2 mg/dL   Alkaline Phosphatase 93 44 - 121 IU/L   AST 23 0 - 40 IU/L   ALT 17 0 - 32 IU/L  TSH  Result Value Ref Range   TSH 2.220 0.450 - 4.500 uIU/mL  CBC w/Diff  Result Value Ref Range   WBC 7.3 3.4 - 10.8 x10E3/uL   RBC 4.85 3.77 - 5.28 x10E6/uL   Hemoglobin 14.6 11.1 - 15.9 g/dL   Hematocrit 44.1 34.0 - 46.6 %   MCV 91 79 - 97 fL   MCH 30.1 26.6 - 33.0 pg   MCHC 33.1 31.5 - 35.7 g/dL   RDW 12.3 11.7 - 15.4 %   Platelets 253 150 - 450 x10E3/uL   Neutrophils 73 Not Estab. %   Lymphs 19 Not Estab. %   Monocytes 8 Not Estab. %   Eos 0 Not Estab. %   Basos 0 Not Estab. %   Neutrophils Absolute 5.2 1.4 - 7.0 x10E3/uL   Lymphocytes Absolute 1.4 0.7 - 3.1 x10E3/uL   Monocytes Absolute 0.6 0.1 - 0.9 x10E3/uL   EOS (ABSOLUTE) 0.0 0.0 - 0.4 x10E3/uL   Basophils Absolute 0.0 0.0 - 0.2 x10E3/uL   Immature Granulocytes 0 Not Estab. %   Immature Grans (Abs) 0.0 0.0 - 0.1 x10E3/uL  Vitamin D (25 hydroxy)  Result Value Ref Range   Vit D, 25-Hydroxy 72.3 30.0 - 100.0 ng/mL  B12  Result Value Ref Range   Vitamin B-12 1,379 (H) 232 - 1,245 pg/mL      Assessment & Plan:   Problem List Items Addressed This Visit      Other   Obesity    Continue with the Naltrexone and Wellbutrin.  Follow up in 2 months for reevaluation.        Other Visit Diagnoses    Encounter for weight management    -  Primary   Continue with the Naltrexone and Wellbutrin.  Follow up in 2 months for reevaluation.    Shortness of breath       Recommend patient make appointment with covid long haul clinic.        Follow up  plan: Return in about 2 months (around 03/18/2021) for Weight Managment.    A total of 20 minutes were spent on this encounter today.  When total time is documented, this includes both the face-to-face and non-face-to-face time personally spent before, during and after the visit on the date of the encounter.

## 2021-01-16 ENCOUNTER — Other Ambulatory Visit: Payer: Self-pay

## 2021-01-16 ENCOUNTER — Encounter: Payer: Self-pay | Admitting: Nurse Practitioner

## 2021-01-16 ENCOUNTER — Ambulatory Visit (INDEPENDENT_AMBULATORY_CARE_PROVIDER_SITE_OTHER): Payer: Commercial Managed Care - PPO | Admitting: Nurse Practitioner

## 2021-01-16 VITALS — BP 128/82 | HR 89 | Temp 97.8°F | Wt 231.5 lb

## 2021-01-16 DIAGNOSIS — Z7689 Persons encountering health services in other specified circumstances: Secondary | ICD-10-CM

## 2021-01-16 DIAGNOSIS — E6609 Other obesity due to excess calories: Secondary | ICD-10-CM | POA: Diagnosis not present

## 2021-01-16 DIAGNOSIS — Z6839 Body mass index (BMI) 39.0-39.9, adult: Secondary | ICD-10-CM

## 2021-01-16 DIAGNOSIS — R0602 Shortness of breath: Secondary | ICD-10-CM

## 2021-01-16 MED ORDER — NALTREXONE HCL 50 MG PO TABS: 50.0000 mg | ORAL_TABLET | Freq: Every day | ORAL | 0 refills | Status: DC

## 2021-01-16 NOTE — Assessment & Plan Note (Signed)
Continue with the Naltrexone and Wellbutrin.  Follow up in 2 months for reevaluation.

## 2021-01-18 ENCOUNTER — Ambulatory Visit
Admission: RE | Admit: 2021-01-18 | Discharge: 2021-01-18 | Disposition: A | Discharge status: Home / Self Care | Payer: Commercial Managed Care - PPO | Source: Ambulatory Visit | Attending: Nurse Practitioner | Admitting: Nurse Practitioner

## 2021-01-18 ENCOUNTER — Ambulatory Visit (INDEPENDENT_AMBULATORY_CARE_PROVIDER_SITE_OTHER): Payer: Commercial Managed Care - PPO | Admitting: Nurse Practitioner

## 2021-01-18 VITALS — BP 112/81 | HR 70 | Temp 97.9°F | Resp 18

## 2021-01-18 DIAGNOSIS — R0600 Dyspnea, unspecified: Secondary | ICD-10-CM | POA: Diagnosis not present

## 2021-01-18 DIAGNOSIS — R439 Unspecified disturbances of smell and taste: Secondary | ICD-10-CM

## 2021-01-18 DIAGNOSIS — R059 Cough, unspecified: Secondary | ICD-10-CM | POA: Diagnosis not present

## 2021-01-18 DIAGNOSIS — Z8616 Personal history of COVID-19: Secondary | ICD-10-CM

## 2021-01-18 DIAGNOSIS — R0609 Other forms of dyspnea: Secondary | ICD-10-CM

## 2021-01-18 MED ORDER — BUDESONIDE-FORMOTEROL FUMARATE 80-4.5 MCG/ACT IN AERO: 2.0000 | INHALATION_SPRAY | Freq: Two times a day (BID) | RESPIRATORY_TRACT | 3 refills | Status: DC

## 2021-01-18 MED ORDER — PREDNISONE 20 MG PO TABS: 20.0000 mg | ORAL_TABLET | Freq: Every day | ORAL | 0 refills | Status: AC

## 2021-01-18 NOTE — Patient Instructions (Addendum)
Covid 19 Cough Shortness of breath:   Stay well hydrated  Stay active  Deep breathing exercises  May start vitamin C daily, vitamin D3 daily, Zinc daily  May take tylenol or fever or pain  May take mucinex DM twice daily  Will order chest x ray:  [facility] [address] [place], [place] [address] [phone] MON - FRI 8:00 AM - 4:00 PM - WALK IN   Will order prednisone  Will order Symbicort 2 puffs twice daily  Will order Singulair    Hand out given on home PT   Follow up:  Follow up in 2 weeks or sooner if needed

## 2021-01-18 NOTE — Progress Notes (Signed)
@Patient  ID: , female    DOB: 10/07/1980, 40 y.o.   MRN: [id number]  Chief Complaint  Patient presents with  . Cough    Tested positive for covid December 21st, cough, shortness of breath, altered taste and smell    Referring provider: 06-21-1982, NP     HPI  Patient presents today for post-COVID care clinic visit.  Patient tested positive for COVID in December 2021.  She did receive monoclonal antibody infusion at that time.  She recovered at home with supportive measures.  Patient states that since that time she has been having ongoing significant cough that is productive at times, shortness of breath with exertion, loss of taste and smell.  She states that her taste and smell is slowly returning at this point.  She states that her shortness of breath is significant she is short of breath just walking from the car into the office this morning.  Patient was walked in office and O2 sats and heart rate remained stable throughout entire walk.  Patient has not had any imaging since diagnosed with COVID.  She does have an albuterol inhaler.  Patient does have a history of asthma. Denies f/c/s, n/v/d, hemoptysis, PND, chest pain or edema.      Allergies  Allergen Reactions  . Codeine Diarrhea and Nausea And Vomiting    Immunization History  Administered Date(s) Administered  . Influenza,inj,Quad PF,6+ Mos 06/30/2015, 07/03/2016, 10/01/2017, 07/31/2018, 08/04/2019  . PFIZER(Purple Top)SARS-COV-2 Vaccination 12/05/2019, 01/03/2020, 12/13/2020  . Td 10/02/2006  . Tdap 10/01/2017    Past Medical History:  Diagnosis Date  . Anemia   . Asthma    sports induced inhalers  . Bipolar 1 disorder (HCC)   . Conversion disorder   . Insomnia   . Migraines    migraines  . MRSA infection 2010  . Ovarian cyst    left 2.5cm  . Sleep apnea    does not use a CPAP  . Vitamin B 12 deficiency   . Vitamin D deficiency     Tobacco History: Social History   Tobacco Use   Smoking Status Former Smoker  . Packs/day: 1.00  . Years: 10.00  . Pack years: 10.00  . Types: Cigarettes  . Quit date: 09/25/2003  . Years since quitting: 17.3  Smokeless Tobacco Never Used   Counseling given: Yes   Outpatient Encounter Medications as of 01/18/2021  Medication Sig  . budesonide-formoterol (SYMBICORT) 80-4.5 MCG/ACT inhaler Inhale 2 puffs into the lungs 2 (two) times daily.  . predniSONE (DELTASONE) 20 MG tablet Take 1 tablet (20 mg total) by mouth daily with breakfast for 5 days.  01/20/2021 albuterol (PROAIR HFA) 108 (90 Base) MCG/ACT inhaler Inhale 2 puffs into the lungs every 6 (six) hours as needed for wheezing or shortness of breath.  . B-D UF III MINI PEN NEEDLES 31G X 5 MM MISC 1 Units by Does not apply route daily.  Marland Kitchen buPROPion (WELLBUTRIN XL) 300 MG 24 hr tablet Take 1 tablet (300 mg total) by mouth daily.  . cyanocobalamin (,VITAMIN B-12,) 1000 MCG/ML injection Inject 1 mL (1,000 mcg total) into the muscle every 14 (fourteen) days.  . fluticasone (FLONASE) 50 MCG/ACT nasal spray Place 2 sprays into both nostrils daily as needed for allergies or rhinitis.  Marland Kitchen ibuprofen (ADVIL) 800 MG tablet Take 1 tablet (800 mg total) by mouth every 8 (eight) hours as needed.  . naltrexone (DEPADE) 50 MG tablet Take 1 tablet (50 mg total) by mouth daily.  Marland Kitchen  NEEDLE, DISP, 18 G 18G X 1" MISC 1 Units by Does not apply route every 14 (fourteen) days.  . St Johns Wort 300 MG TABS Take 1 tablet by mouth daily.  . Vitamin D, Cholecalciferol, 25 MCG (1000 UT) CAPS Take 1 capsule by mouth daily.   No facility-administered encounter medications on file as of 01/18/2021.     Review of Systems  Review of Systems  Constitutional: Negative.   HENT: Negative.   Respiratory: Positive for cough and shortness of breath.   Cardiovascular: Negative.  Negative for chest pain, palpitations and leg swelling.  Gastrointestinal: Negative.   Allergic/Immunologic: Negative.   Neurological: Negative.    Psychiatric/Behavioral: Negative.        Physical Exam  BP 112/81   Pulse 70   Temp 97.9 F (36.6 C)   Resp 18   LMP 06/01/2015 (Within Days)   SpO2 98% Comment: RA  Wt Readings from Last 5 Encounters:  01/16/21 231 lb 8 oz (105 kg)  12/19/20 224 lb 2 oz (101.7 kg)  10/21/20 224 lb (101.6 kg)  09/22/20 219 lb 2 oz (99.4 kg)  06/23/20 227 lb 3.2 oz (103.1 kg)     Physical Exam Vitals and nursing note reviewed.  Constitutional:      General: She is not in acute distress.    Appearance: She is well-developed.  Cardiovascular:     Rate and Rhythm: Normal rate and regular rhythm.  Pulmonary:     Effort: Pulmonary effort is normal.     Breath sounds: Normal breath sounds.  Musculoskeletal:     Right lower leg: No edema.     Left lower leg: No edema.  Neurological:     Mental Status: She is alert and oriented to person, place, and time.  Psychiatric:        Mood and Affect: Mood normal.        Behavior: Behavior normal.       Assessment & Plan:   History of COVID-19 Cough Shortness of breath:   Stay well hydrated  Stay active  Deep breathing exercises  May start vitamin C daily, vitamin D3 daily, Zinc daily  May take tylenol or fever or pain  May take mucinex DM twice daily  Will order chest x ray:  [facility] [address] [place], [place] [address] [phone] MON - FRI 8:00 AM - 4:00 PM - WALK IN   Will order prednisone  Will order Symbicort 2 puffs twice daily  Will order Singulair    Hand out given on home PT   Follow up:  Follow up in 2 weeks or sooner if needed      Ivonne Andrew, NP 01/19/2021

## 2021-01-19 DIAGNOSIS — R0600 Dyspnea, unspecified: Secondary | ICD-10-CM | POA: Insufficient documentation

## 2021-01-19 DIAGNOSIS — R439 Unspecified disturbances of smell and taste: Secondary | ICD-10-CM | POA: Insufficient documentation

## 2021-01-19 DIAGNOSIS — R0609 Other forms of dyspnea: Secondary | ICD-10-CM | POA: Insufficient documentation

## 2021-01-19 DIAGNOSIS — R059 Cough, unspecified: Secondary | ICD-10-CM | POA: Insufficient documentation

## 2021-01-19 DIAGNOSIS — Z8616 Personal history of COVID-19: Secondary | ICD-10-CM | POA: Insufficient documentation

## 2021-01-19 DIAGNOSIS — R053 Chronic cough: Secondary | ICD-10-CM | POA: Insufficient documentation

## 2021-01-19 NOTE — Assessment & Plan Note (Addendum)
Cough Shortness of breath:   Stay well hydrated  Stay active  Deep breathing exercises  May start vitamin C daily, vitamin D3 daily, Zinc daily  May take tylenol or fever or pain  May take mucinex DM twice daily  Will order chest x ray:  [facility] [address] [place], [place] [address] [phone] MON - FRI 8:00 AM - 4:00 PM - WALK IN   Will order prednisone  Will order Symbicort 2 puffs twice daily  Will order Singulair    Hand out given on home PT   Follow up:  Follow up in 2 weeks or sooner if needed

## 2021-02-09 ENCOUNTER — Ambulatory Visit (INDEPENDENT_AMBULATORY_CARE_PROVIDER_SITE_OTHER): Payer: Commercial Managed Care - PPO | Admitting: Nurse Practitioner

## 2021-02-09 VITALS — BP 117/82 | HR 68 | Temp 97.9°F | Resp 18

## 2021-02-09 DIAGNOSIS — R5381 Other malaise: Secondary | ICD-10-CM | POA: Diagnosis not present

## 2021-02-09 DIAGNOSIS — Z8616 Personal history of COVID-19: Secondary | ICD-10-CM | POA: Diagnosis not present

## 2021-02-09 DIAGNOSIS — R053 Chronic cough: Secondary | ICD-10-CM

## 2021-02-09 MED ORDER — NYSTATIN 100000 UNIT/ML MT SUSP: 5.0000 mL | Freq: Four times a day (QID) | OROMUCOSAL | 0 refills | Status: DC

## 2021-02-09 NOTE — Assessment & Plan Note (Signed)
Cough Shortness of breath:   Stay well hydrated  Stay active  Deep breathing exercises  May start vitamin C daily, vitamin D3 daily, Zinc daily  May take tylenol for fever or pain  May take mucinex DM twice daily  Olfactory re-training    Continue Symbicort 2 puffs twice daily  Continue Singulair    Will place referral to pulmonary - Dr. Sherene Sires - cough  Will place referral for PT  Thrush:  Will order nystatin suspension     Follow up:  Follow up after starting with PT and after appointment with pulmonary

## 2021-02-09 NOTE — Patient Instructions (Addendum)
History of COVID-19 Cough Shortness of breath:   Stay well hydrated  Stay active  Deep breathing exercises  May start vitamin C daily, vitamin D3 daily, Zinc daily  May take tylenol for fever or pain  May take mucinex DM twice daily  Olfactory re-training    Continue Symbicort 2 puffs twice daily  Continue Singulair    Will place referral to pulmonary - Dr. Sherene Sires - cough  Will place referral for PT  Thrush:  Will order nystatin suspension     Follow up:  Follow up after starting with PT and after appointment with pulmonary

## 2021-02-09 NOTE — Progress Notes (Signed)
@Patient  ID: , female    DOB: 1981/08/08, 40 y.o.   MRN: [id number]  Chief Complaint  Patient presents with  . Follow-up    Referring provider: [id number], NP  HPI  Patient presents today for post-COVID care clinic visit.  This is a follow-up visit.  Patient was originally diagnosed with COVID in December 2021.  She continues to have ongoing cough and loss of taste and smell.  She states that her taste and smell is slowly returning.  She does feel like she is physically deconditioned as well.  Patient was started on Singulair and Symbicort at her last visit here.  She was also prescribed a round of prednisone.  She states that her cough has improved since she started these medications.  She states that the cough is not completely cleared though.  Patient is trying to stay active and has been doing physical therapy exercises that were given a handout her at last visit.  We discussed that we can refer her to physical therapy for dedicated sessions.  Patient is agreeable to this.  We will refer her to pulmonary for chronic cough.  Patient also states that she has been having issues with irritated throat since starting Symbicort.  We will prescribe nystatin and discussed that she does need to rinse her mouth out well after using the inhaler.  Denies f/c/s, n/v/d, hemoptysis, PND, chest pain or edema.      Allergies  Allergen Reactions  . Codeine Diarrhea and Nausea And Vomiting    Immunization History  Administered Date(s) Administered  . Influenza,inj,Quad PF,6+ Mos 06/30/2015, 07/03/2016, 10/01/2017, 07/31/2018, 08/04/2019  . PFIZER(Purple Top)SARS-COV-2 Vaccination 12/05/2019, 01/03/2020, 12/13/2020  . Td 10/02/2006  . Tdap 10/01/2017    Past Medical History:  Diagnosis Date  . Anemia   . Asthma    sports induced inhalers  . Bipolar 1 disorder (HCC)   . Conversion disorder   . Insomnia   . Migraines    migraines  . MRSA infection 2010  . Ovarian cyst     left 2.5cm  . Sleep apnea    does not use a CPAP  . Vitamin B 12 deficiency   . Vitamin D deficiency     Tobacco History: Social History   Tobacco Use  Smoking Status Former Smoker  . Packs/day: 1.00  . Years: 10.00  . Pack years: 10.00  . Types: Cigarettes  . Quit date: 09/25/2003  . Years since quitting: 17.3  Smokeless Tobacco Never Used   Counseling given: Yes   Outpatient Encounter Medications as of 02/09/2021  Medication Sig  . nystatin (MYCOSTATIN) 100000 UNIT/ML suspension Take 5 mLs (500,000 Units total) by mouth 4 (four) times daily.  02/11/2021 albuterol (PROAIR HFA) 108 (90 Base) MCG/ACT inhaler Inhale 2 puffs into the lungs every 6 (six) hours as needed for wheezing or shortness of breath.  . B-D UF III MINI PEN NEEDLES 31G X 5 MM MISC 1 Units by Does not apply route daily.  . budesonide-formoterol (SYMBICORT) 80-4.5 MCG/ACT inhaler Inhale 2 puffs into the lungs 2 (two) times daily.  Mar[place] Kitchen buPROPion (WELLBUTRIN XL) 300 MG 24 hr tablet Take 1 tablet (300 mg total) by mouth daily.  . cyanocobalamin (,VITAMIN B-12,) 1000 MCG/ML injection Inject 1 mL (1,000 mcg total) into the muscle every 14 (fourteen) days.  . fluticasone (FLONASE) 50 MCG/ACT nasal spray Place 2 sprays into both nostrils daily as needed for allergies or rhinitis.  Marland Kitchen ibuprofen (ADVIL) 800 MG tablet Take 1  tablet (800 mg total) by mouth every 8 (eight) hours as needed.  . naltrexone (DEPADE) 50 MG tablet Take 1 tablet (50 mg total) by mouth daily.  Marland Kitchen NEEDLE, DISP, 18 G 18G X 1" MISC 1 Units by Does not apply route every 14 (fourteen) days.  . St Johns Wort 300 MG TABS Take 1 tablet by mouth daily.  . Vitamin D, Cholecalciferol, 25 MCG (1000 UT) CAPS Take 1 capsule by mouth daily.   No facility-administered encounter medications on file as of 02/09/2021.     Review of Systems  Review of Systems  Constitutional: Negative.  Negative for fatigue and fever.  HENT: Negative.   Respiratory: Positive for cough and  shortness of breath.   Cardiovascular: Negative.  Negative for chest pain, palpitations and leg swelling.  Gastrointestinal: Negative.   Allergic/Immunologic: Negative.   Neurological: Negative.   Psychiatric/Behavioral: Negative.        Physical Exam  BP 117/82   Pulse 68   Temp 97.9 F (36.6 C)   Resp 18   LMP 06/01/2015 (Within Days)   SpO2 100%   Wt Readings from Last 5 Encounters:  01/16/21 231 lb 8 oz (105 kg)  12/19/20 224 lb 2 oz (101.7 kg)  10/21/20 224 lb (101.6 kg)  09/22/20 219 lb 2 oz (99.4 kg)  06/23/20 227 lb 3.2 oz (103.1 kg)     Physical Exam Vitals and nursing note reviewed.  Constitutional:      General: She is not in acute distress.    Appearance: She is well-developed.  Cardiovascular:     Rate and Rhythm: Normal rate and regular rhythm.  Pulmonary:     Effort: Pulmonary effort is normal.     Breath sounds: Normal breath sounds.  Musculoskeletal:     Right lower leg: No edema.     Left lower leg: No edema.  Neurological:     Mental Status: She is alert and oriented to person, place, and time.  Psychiatric:        Mood and Affect: Mood normal.        Behavior: Behavior normal.       Imaging: DG Chest 2 View  Result Date: 01/19/2021 CLINICAL DATA:  40 year old female with history of COVID infection in December 2021. EXAM: CHEST - 2 VIEW COMPARISON:  Chest x-ray 12/17/2013. FINDINGS: Mild blunting of both costophrenic sulci appreciated only on the lateral projection, which could suggest pleuroparenchymal scarring or trace bilateral pleural effusions. Lung volumes are normal. No consolidative airspace disease. No pneumothorax. No pulmonary nodule or mass noted. Pulmonary vasculature and the cardiomediastinal silhouette are within normal limits. IMPRESSION: 1. Mild blunting of the costophrenic sulci on the lateral projection which may suggest chronic post infectious or inflammatory pleuroparenchymal scarring or trace bilateral pleural effusions.  Electronically Signed   By: Trudie Reed M.D.   On: 01/19/2021 09:40     Assessment & Plan:   History of COVID-19 Cough Shortness of breath:   Stay well hydrated  Stay active  Deep breathing exercises  May start vitamin C daily, vitamin D3 daily, Zinc daily  May take tylenol for fever or pain  May take mucinex DM twice daily  Olfactory re-training    Continue Symbicort 2 puffs twice daily  Continue Singulair    Will place referral to pulmonary - Dr. Sherene Sires - cough  Will place referral for PT  Thrush:  Will order nystatin suspension     Follow up:  Follow up after starting with PT  and after appointment with pulmonary       Ivonne Andrew, NP 02/09/2021

## 2021-02-28 ENCOUNTER — Other Ambulatory Visit: Payer: Self-pay

## 2021-02-28 ENCOUNTER — Ambulatory Visit: Payer: Commercial Managed Care - PPO | Attending: Nurse Practitioner

## 2021-02-28 DIAGNOSIS — M6281 Muscle weakness (generalized): Secondary | ICD-10-CM

## 2021-02-28 DIAGNOSIS — R2689 Other abnormalities of gait and mobility: Secondary | ICD-10-CM | POA: Diagnosis present

## 2021-03-01 NOTE — Therapy (Signed)
[facility] [address] [place], [place], [address] Phone: [phone]   Fax:  [phone]  Physical Therapy Evaluation  Patient Details  Name: Emily [name] MRN: [id number] Date of Birth: October 28, 1980 Referring Provider (PT): Ivonne Andrew, NP   Encounter Date: 02/28/2021   PT End of Session - 02/28/21 1208    Visit Number 1    Number of Visits 12    Date for PT Re-Evaluation 04/12/21    Authorization Type UHC    PT Start Time 1212    PT Stop Time 1258    PT Time Calculation (min) 46 min    Activity Tolerance Patient limited by pain;Patient tolerated treatment well    Behavior During Therapy [facility] for tasks assessed/performed           Past Medical History:  Diagnosis Date  . Anemia   . Asthma    sports induced inhalers  . Bipolar 1 disorder (HCC)   . Conversion disorder   . Insomnia   . Migraines    migraines  . MRSA infection 2010  . Ovarian cyst    left 2.5cm  . Sleep apnea    does not use a CPAP  . Vitamin B 12 deficiency   . Vitamin D deficiency     Past Surgical History:  Procedure Laterality Date  . ABDOMINAL HYSTERECTOMY    . BUNIONECTOMY    . ESOPHAGOGASTRODUODENOSCOPY (EGD) WITH PROPOFOL N/A 11/03/2018   Procedure: ESOPHAGOGASTRODUODENOSCOPY (EGD) WITH PROPOFOL;  Surgeon: Wyline Mood, MD;  Location: [facility] ENDOSCOPY;  Service: Gastroenterology;  Laterality: N/A;  . HAMMER TOE SURGERY    . HAND SURGERY  2014   cyst removed  . INTRAUTERINE DEVICE (IUD) INSERTION N/A 05/09/2015   Procedure: INTRAUTERINE DEVICE (IUD) INSERTION;  Surgeon: Hildred Laser, MD;  Location: [facility] ORS;  Service: Gynecology;  Laterality: N/A;  . LAPAROSCOPIC ASSISTED VAGINAL HYSTERECTOMY N/A 06/20/2015   Procedure: LAPAROSCOPIC ASSISTED VAGINAL HYSTERECTOMY, BILATERAL SALPINGECTOMY, CYSTOSCOPY, IUD REMOVAL ;  Surgeon: Hildred Laser, MD;  Location: [facility] ORS;  Service: Gynecology;  Laterality: N/A;  . LAPAROSCOPIC TUBAL LIGATION  Bilateral 05/09/2015   Procedure: LAPAROSCOPIC TUBAL LIGATION;  Surgeon: Hildred Laser, MD;  Location: [facility] ORS;  Service: Gynecology;  Laterality: Bilateral;  . NASAL SINUS SURGERY    . TUBAL LIGATION  2007    Vitals:      Subjective Assessment - 02/28/21 1214    Subjective Pt presents to PT with reports of decreased endurance and strength post covid in December 2021. Pt has had a lot of difficulty with activity tolerance, recently tried to exercise at bootcamp style class and had a lot of difficult. 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 She does live on a working cattle farm and notes severe change and difficulty d/t weakness and decreased endurance. Has also not been able to do 5K races with friends and family. Pt has been having a lot of chest pain, especially with activity and occasionally at rest. Chest Xray revealed swelling and scar tissue in lungs, indicating decreased activity tolerance.    Limitations Sitting;Lifting;Standing;Walking;House hold activities;Other (comment)    How long can you sit comfortably? indefinite    How long can you stand comfortably? "a few hours"    How long can you walk comfortably? 1.5 hours    Patient Stated Goals would like to increase strength and endurance in order to improve work on her farm; would also like to  lift more than 50lbs    Currently in Pain? No/denies    Pain Score 0-No pain   6/10 at worst   Pain Location Chest              [facility] PT Assessment - 03/01/21 0001      Assessment   Medical Diagnosis Z86.16 (ICD-10-CM) - History of COVID-19  R53.81 (ICD-10-CM) - Physical deconditioning    Referring Provider (PT) Ivonne Andrew, NP    Hand Dominance Right    Next MD Visit after therapy      Precautions   Precautions None      Restrictions   Weight Bearing Restrictions No      Balance Screen   Has the patient fallen in the past 6 months No    Has the patient had a decrease in  activity level because of a fear of falling?  No    Is the patient reluctant to leave their home because of a fear of falling?  No      Home Environment   Living Environment Private residence    Living Arrangements Children    Type of Home House    Home Access Stairs to enter    Entrance Stairs-Number of Steps 3    Entrance Stairs-Rails Right    Home Layout Two level    Alternate Level Stairs-Number of Steps 12   to basement   Alternate Level Stairs-Rails Can reach both    Home Equipment Cane - single point      Prior Function   Level of Independence Independent;Independent with basic ADLs    Vocation Requirements carrying, lifting, walking (farm work)      Copy Status Within Functional Limits for tasks assessed    Attention Focused      Observation/Other Assessments   Focus on Therapeutic Outcomes (FOTO)  48% function; 57% predicted      Sensation   Light Touch Impaired Detail    Light Touch Impaired Details Impaired RUE   previous injury     Strength   Right Shoulder Flexion 4+/5    Right Shoulder ABduction 5/5    Left Shoulder Flexion 5/5    Left Shoulder ABduction 5/5    Right Elbow Flexion 5/5    Right Elbow Extension 5/5    Left Elbow Flexion 5/5    Left Elbow Extension 5/5    Right Hand Grip (lbs) 65    Left Hand Grip (lbs) 85    Right Hip Flexion 5/5    Right Hip ABduction 5/5    Right Hip ADduction 5/5    Left Hip Flexion 5/5    Left Hip ABduction 5/5    Left Hip ADduction 5/5    Right Knee Flexion 5/5    Right Knee Extension 5/5    Left Knee Flexion 5/5    Left Knee Extension 5/5      Transfers   Comments 30 Second Chair Rise: 10 reps      6 Minute Walk- Baseline   02 Sat (%RA) 98 %      6 Minute walk- Post Test   6 Minute Walk Post Test yes    02 Sat (%RA) 97 %      6 minute walk test results    Aerobic Endurance Distance Walked 1125                      Objective measurements completed on examination:  See  above findings.       [facility] Adult PT Treatment/Exercise - 03/01/21 0001      Exercises   Exercises Knee/Hip      Knee/Hip Exercises: Seated   Sit to Sand 10 reps;without UE support      Knee/Hip Exercises: Supine   Bridges 10 reps    Straight Leg Raises 10 reps      Shoulder Exercises: Standing   Row 10 reps;Both    Theraband Level (Shoulder Row) --   black tband                 PT Education - 03/01/21 1508    Education Details eval findings, HEP, POC; initiation of walking program    Person(s) Educated Patient    Methods Explanation;Demonstration;Handout    Comprehension Verbalized understanding;Returned demonstration            PT Short Term Goals - 03/01/21 1500      PT SHORT TERM GOAL #1   Title Pt will be compliant and knowledgeable with initial HEP for improved carryover and strength    Baseline initial HEP given    Time 3    Period Weeks    Status New    Target Date 03/22/21      PT SHORT TERM GOAL #2   Title Pt will increase reps in 30 Second Chair Rise to <= 15 in order to show improved functional strength and mobility    Baseline 10 reps    Time 3    Period Weeks    Status New    Target Date 03/22/21             PT Long Term Goals - 03/01/21 1501      PT LONG TERM GOAL #1   Title Pt will improve FOTO score to >/= 57% in order to improvre confidence and functional ability    Baseline 48% function    Time 8    Period Weeks    Status New    Target Date 04/26/21      PT LONG TERM GOAL #2   Title Pt will be able to ambulate 147000ft during 6MWT in order to improve endurance and functional ability    Baseline 115500ft    Time 6    Period Weeks    Status New    Target Date 04/26/21      PT LONG TERM GOAL #3   Title Pt will be able to lift 50lb box from floor to overhead with no increase in pain or fatigue in order to improve ability to work on cattle farm    Baseline unable    Time 6    Period Weeks    Status New    Target Date  04/26/21                  Plan - 03/01/21 1515    Clinical Impression Statement Pt presents to PT with reports of decreased endurance and strength post covid in December 2021. Physical findings are consistent with referring provider impression, as pt demonstrates slight proximal musculature weakness and decreased endurance, as evidienced by 30 Sec Chair Rise and 6MWT being below age related norms. She would benefit from skilled PT services working on improving strength and activity tolerance. 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 Will assess response to initial HEP and walking program and progress as able.    Personal Factors and  Comorbidities Time since onset of injury/illness/exacerbation;Other;Comorbidity 3+    Comorbidities athsma, conversion disorder, Bipolar 1 disorder, anemia, Insomnia    Examination-Activity Limitations Bed Mobility;Bend;Carry;Dressing;Sit;Transfers;Stand;Stairs;Squat;Lift;Locomotion Level    Stability/Clinical Decision Making Stable/Uncomplicated    Clinical Decision Making Low    Rehab Potential Excellent    PT Frequency 2x / week    PT Duration 8 weeks    PT Treatment/Interventions ADLs/Self Care Home Management;Aquatic Therapy;Biofeedback;Cryotherapy;Electrical Stimulation;Moist Heat;Gait training;Stair training;Functional mobility training;Therapeutic activities;Therapeutic exercise;Balance training;Neuromuscular re-education;Manual techniques;Patient/family education;Passive range of motion    PT Next Visit Plan assess response to HEP and walking program; progress generalized strengthening as able    PT Home Exercise Plan Access Code: [email or web address]    Consulted and Agree with Plan of Care Patient           Patient will benefit from skilled therapeutic intervention in order to improve the following deficits and impairments:  Abnormal gait,Cardiopulmonary status limiting activity,Decreased activity  tolerance,Decreased balance,Decreased endurance,Decreased knowledge of use of DME,Decreased mobility,Difficulty walking,Decreased strength,Decreased scar mobility,Impaired flexibility,Impaired perceived functional ability,Increased fascial restricitons,Pain  Visit Diagnosis: Muscle weakness (generalized)  Other abnormalities of gait and mobility     Problem List Patient Active Problem List   Diagnosis Date Noted  . History of COVID-19 01/19/2021  . Cough 01/19/2021  . Dyspnea on exertion 01/19/2021  . Olfactory impairment 01/19/2021  . Irritable bowel syndrome with diarrhea 10/27/2018  . Surgical menopause, symptomatic 02/12/2018  . Obesity 07/03/2016  . Status post laparoscopic hysterectomy 06/20/2015  . Insomnia 04/14/2015  . Asthma 04/14/2015  . Bipolar disorder (HCC) 04/14/2015  . Vitamin B12 deficiency 04/14/2015  . Vitamin D deficiency 04/14/2015  . Migraine 04/14/2015  . Sleep apnea 04/14/2015    Eloy End, PT, DPT 03/01/21 3:20 PM  [facility] [address] [place], [place], [address] Phone: [phone]   Fax:  [phone]  Name: Emily [name] MRN: [id number] Date of Birth: 10/20/1980

## 2021-03-14 ENCOUNTER — Other Ambulatory Visit: Payer: Self-pay

## 2021-03-14 ENCOUNTER — Ambulatory Visit: Payer: Commercial Managed Care - PPO

## 2021-03-14 DIAGNOSIS — M6281 Muscle weakness (generalized): Secondary | ICD-10-CM

## 2021-03-14 DIAGNOSIS — R2689 Other abnormalities of gait and mobility: Secondary | ICD-10-CM

## 2021-03-14 NOTE — Therapy (Signed)
[facility] [address] [place], [place], [address] Phone: [phone]   Fax:  [phone]  Physical Therapy Treatment  Patient Details  Name: Emily [name] MRN: [id number] Date of Birth: 12/26/1980 Referring Provider (PT): Ivonne Andrew, NP   Encounter Date: 03/14/2021   PT End of Session - 03/14/21 1130     Visit Number 2    Number of Visits 12    Date for PT Re-Evaluation 04/12/21    Authorization Type UHC    PT Start Time 1133    PT Stop Time 1211    PT Time Calculation (min) 38 min    Activity Tolerance Patient limited by pain;Patient tolerated treatment well    Behavior During Therapy WFL for tasks assessed/performed             Past Medical History:  Diagnosis Date   Anemia    Asthma    sports induced inhalers   Bipolar 1 disorder (HCC)    Conversion disorder    Insomnia    Migraines    migraines   MRSA infection 2010   Ovarian cyst    left 2.5cm   Sleep apnea    does not use a CPAP   Vitamin B 12 deficiency    Vitamin D deficiency     Past Surgical History:  Procedure Laterality Date   ABDOMINAL HYSTERECTOMY     BUNIONECTOMY     ESOPHAGOGASTRODUODENOSCOPY (EGD) WITH PROPOFOL N/A 11/03/2018   Procedure: ESOPHAGOGASTRODUODENOSCOPY (EGD) WITH PROPOFOL;  Surgeon: Wyline Mood, MD;  Location: [facility] ENDOSCOPY;  Service: Gastroenterology;  Laterality: N/A;   HAMMER TOE SURGERY     HAND SURGERY  2014   cyst removed   INTRAUTERINE DEVICE (IUD) INSERTION N/A 05/09/2015   Procedure: INTRAUTERINE DEVICE (IUD) INSERTION;  Surgeon: Hildred Laser, MD;  Location: [facility] ORS;  Service: Gynecology;  Laterality: N/A;   LAPAROSCOPIC ASSISTED VAGINAL HYSTERECTOMY N/A 06/20/2015   Procedure: LAPAROSCOPIC ASSISTED VAGINAL HYSTERECTOMY, BILATERAL SALPINGECTOMY, CYSTOSCOPY, IUD REMOVAL ;  Surgeon: Hildred Laser, MD;  Location: [facility] ORS;  Service: Gynecology;  Laterality: N/A;   LAPAROSCOPIC TUBAL LIGATION Bilateral  05/09/2015   Procedure: LAPAROSCOPIC TUBAL LIGATION;  Surgeon: Hildred Laser, MD;  Location: [facility] ORS;  Service: Gynecology;  Laterality: Bilateral;   NASAL SINUS SURGERY     TUBAL LIGATION  2007    There were no vitals filed for this visit.   Subjective Assessment - 03/14/21 1130     Subjective Pt presents to PT with reports of continued chest pain with exercise. She has been compliant with HEP and has been doing boot camp workouts. She is ready to begin PT treamtent at this time.    Currently in Pain? No/denies    Pain Score 0-No pain                               [facility] Adult PT Treatment/Exercise - 03/14/21 0001       Knee/Hip Exercises: Aerobic   Recumbent Bike lvl 3 x 4 min while taking subjective      Knee/Hip Exercises: Standing   Functional Squat Limitations 3x10 holding free motion handle    Other Standing Knee Exercises resisted walkout 30bs x 5 fwd      Knee/Hip Exercises: Seated   Sit to Sand 3 sets;10 reps   10lb overhead press     Knee/Hip Exercises: Supine   Bridges with Clamshell 2 sets;10 reps  blue tband   Straight Leg Raises 3 sets;10 reps;Both    Straight Leg Raises Limitations 2lbs cuff weight      Knee/Hip Exercises: Sidelying   Clams 2x10 blue tband ea      Shoulder Exercises: Standing   Row 15 reps   x 3   Theraband Level (Shoulder Row) Other (comment)   Black                   PT Education - 03/14/21 1212     Education Details HEP    Person(s) Educated Patient    Methods Explanation;Demonstration;Handout    Comprehension Returned demonstration;Verbalized understanding              PT Short Term Goals - 03/01/21 1500       PT SHORT TERM GOAL #1   Title Pt will be compliant and knowledgeable with initial HEP for improved carryover and strength    Baseline initial HEP given    Time 3    Period Weeks    Status New    Target Date 03/22/21      PT SHORT TERM GOAL #2   Title Pt will increase reps in 30  Second Chair Rise to <= 15 in order to show improved functional strength and mobility    Baseline 10 reps    Time 3    Period Weeks    Status New    Target Date 03/22/21               PT Long Term Goals - 03/01/21 1501       PT LONG TERM GOAL #1   Title Pt will improve FOTO score to >/= 57% in order to improvre confidence and functional ability    Baseline 48% function    Time 8    Period Weeks    Status New    Target Date 04/26/21      PT LONG TERM GOAL #2   Title Pt will be able to ambulate 1432ft during in order to improve endurance and functional ability    Baseline 1122ft    Time 6    Period Weeks    Status New    Target Date 04/26/21      PT LONG TERM GOAL #3   Title Pt will be able to lift 50lb box from floor to overhead with no increase in pain or fatigue 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 She is slowly increasing her overall strength and activity tolerance with exercises. Pt continues to benefit from skilled PT services working on improving strength and functional activity tolerance. Will continue to progress as able.    PT Treatment/Interventions ADLs/Self Care Home Management;Aquatic Therapy;Biofeedback;Cryotherapy;Electrical Stimulation;Moist Heat;Gait training;Stair training;Functional mobility training;Therapeutic activities;Therapeutic exercise;Balance training;Neuromuscular re-education;Manual techniques;Patient/family education;Passive range of motion    PT Next Visit Plan continue to progress strength and endurance as able    PT Home Exercise Plan Access Code: [email or web address]             Patient will benefit from skilled therapeutic intervention in order to improve the following  deficits and  impairments:  Abnormal gait, Cardiopulmonary status limiting activity, Decreased activity tolerance, Decreased balance, Decreased endurance, Decreased knowledge of use of DME, Decreased mobility, Difficulty walking, Decreased strength, Decreased scar mobility, Impaired flexibility, Impaired perceived functional ability, Increased fascial restricitons, Pain  Visit Diagnosis: Muscle weakness (generalized)  Other abnormalities of gait and mobility     Problem List Patient Active Problem List   Diagnosis Date Noted   History of COVID-19 01/19/2021   Cough 01/19/2021   Dyspnea on exertion 01/19/2021   Olfactory impairment 01/19/2021   Irritable bowel syndrome with diarrhea 10/27/2018   Surgical menopause, symptomatic 02/12/2018   Obesity 07/03/2016   Status post laparoscopic hysterectomy 06/20/2015   Insomnia 04/14/2015   Asthma 04/14/2015   Bipolar disorder (HCC) 04/14/2015   Vitamin B12 deficiency 04/14/2015   Vitamin D deficiency 04/14/2015   Migraine 04/14/2015   Sleep apnea 04/14/2015    Eloy End, PT, DPT 03/14/21 12:12 PM   [facility] [address] [place], [place], [address] Phone: [phone]   Fax:  [phone]  Name: Emily [name] MRN: [id number] Date of Birth: March 18, 1981

## 2021-03-16 ENCOUNTER — Other Ambulatory Visit: Payer: Self-pay

## 2021-03-16 ENCOUNTER — Ambulatory Visit: Payer: Commercial Managed Care - PPO

## 2021-03-16 DIAGNOSIS — R2689 Other abnormalities of gait and mobility: Secondary | ICD-10-CM

## 2021-03-16 DIAGNOSIS — M6281 Muscle weakness (generalized): Secondary | ICD-10-CM | POA: Diagnosis not present

## 2021-03-16 NOTE — Therapy (Signed)
[facility] [address] [place], [place], [address] Phone: [phone]   Fax:  [phone]  Physical Therapy Treatment  Patient Details  Name: Emily [name] MRN: [id number] Date of Birth: 1981-08-10 Referring Provider (PT): Ivonne Andrew, NP   Encounter Date: 03/16/2021   PT End of Session - 03/16/21 1042     Visit Number 3    Number of Visits 12    Date for PT Re-Evaluation 04/12/21    Authorization Type UHC    PT Start Time 1045    PT Stop Time 1123    PT Time Calculation (min) 38 min    Activity Tolerance Patient limited by pain;Patient tolerated treatment well    Behavior During Therapy WFL for tasks assessed/performed             Past Medical History:  Diagnosis Date   Anemia    Asthma    sports induced inhalers   Bipolar 1 disorder (HCC)    Conversion disorder    Insomnia    Migraines    migraines   MRSA infection 2010   Ovarian cyst    left 2.5cm   Sleep apnea    does not use a CPAP   Vitamin B 12 deficiency    Vitamin D deficiency     Past Surgical History:  Procedure Laterality Date   ABDOMINAL HYSTERECTOMY     BUNIONECTOMY     ESOPHAGOGASTRODUODENOSCOPY (EGD) WITH PROPOFOL N/A 11/03/2018   Procedure: ESOPHAGOGASTRODUODENOSCOPY (EGD) WITH PROPOFOL;  Surgeon: Wyline Mood, MD;  Location: [facility] ENDOSCOPY;  Service: Gastroenterology;  Laterality: N/A;   HAMMER TOE SURGERY     HAND SURGERY  2014   cyst removed   INTRAUTERINE DEVICE (IUD) INSERTION N/A 05/09/2015   Procedure: INTRAUTERINE DEVICE (IUD) INSERTION;  Surgeon: Hildred Laser, MD;  Location: [facility] ORS;  Service: Gynecology;  Laterality: N/A;   LAPAROSCOPIC ASSISTED VAGINAL HYSTERECTOMY N/A 06/20/2015   Procedure: LAPAROSCOPIC ASSISTED VAGINAL HYSTERECTOMY, BILATERAL SALPINGECTOMY, CYSTOSCOPY, IUD REMOVAL ;  Surgeon: Hildred Laser, MD;  Location: [facility] ORS;  Service: Gynecology;  Laterality: N/A;   LAPAROSCOPIC TUBAL LIGATION Bilateral  05/09/2015   Procedure: LAPAROSCOPIC TUBAL LIGATION;  Surgeon: Hildred Laser, MD;  Location: [facility] ORS;  Service: Gynecology;  Laterality: Bilateral;   NASAL SINUS SURGERY     TUBAL LIGATION  2007    There were no vitals filed for this visit.   Subjective Assessment - 03/16/21 1042     Subjective Pt presents to PT with reports of muscle soreness post last PT session and boot camp exercise. She has been compliant with her HEP with no adversee effect. Pt is ready to begin PT treatment at this time.    Currently in Pain? No/denies    Pain Score 0-No pain                               [facility] Adult PT Treatment/Exercise - 03/16/21 0001       Exercises   Exercises Shoulder;Lumbar      Lumbar Exercises: Standing   Other Standing Lumbar Exercises pallof press 2x10 10lb      Knee/Hip Exercises: Aerobic   Nustep lvl 6 x 4 min UE/LE while taking subjective      Knee/Hip Exercises: Standing   Hip Extension 2 sets;15 reps    Extension Limitations red tband    Other Standing Knee Exercises lateral walk red tband x 3 laps in //  Other Standing Knee Exercises monster walk red tband x 2 laps in //      Knee/Hip Exercises: Supine   Single Leg Bridge 5 reps;Both      Shoulder Exercises: Supine   Horizontal ABduction Limitations 3x10 - blue tband      Shoulder Exercises: Standing   Row Limitations 2x20 black tband    Other Standing Exercises standing row w/ ER 2x10 GTB                      PT Short Term Goals - 03/01/21 1500       PT SHORT TERM GOAL #1   Title Pt will be compliant and knowledgeable with initial HEP for improved carryover and strength    Baseline initial HEP given    Time 3    Period Weeks    Status New    Target Date 03/22/21      PT SHORT TERM GOAL #2   Title Pt will increase reps in 30 Second Chair Rise to <= 15 in order to show improved functional strength and mobility    Baseline 10 reps    Time 3    Period Weeks    Status  New    Target Date 03/22/21               PT Long Term Goals - 03/01/21 1501       PT LONG TERM GOAL #1   Title Pt will improve FOTO score to >/= 57% in order to improvre confidence and functional ability    Baseline 48% function    Time 8    Period Weeks    Status New    Target Date 04/26/21      PT LONG TERM GOAL #2   Title Pt will be able to ambulate 1447ft during in order to improve endurance and functional ability    Baseline 1167ft    Time 6    Period Weeks    Status New    Target Date 04/26/21      PT LONG TERM GOAL #3   Title Pt will be able to lift 50lb box from floor to overhead with no increase in pain or fatigue 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 She continues to progress well with therapy, showing improving functional activity tolerance and strength. PT will continue to treat and progress pt as tolerated per POC as prescribed.    PT Treatment/Interventions ADLs/Self Care Home Management;Aquatic Therapy;Biofeedback;Cryotherapy;Electrical Stimulation;Moist Heat;Gait training;Stair training;Functional mobility training;Therapeutic activities;Therapeutic exercise;Balance training;Neuromuscular re-education;Manual techniques;Patient/family education;Passive range of motion    PT Next Visit Plan continue to progress strength and endurance as able    PT Home Exercise Plan Access Code: [email or web address]             Patient will benefit from skilled therapeutic intervention in order to improve the following deficits and impairments:  Abnormal gait, Cardiopulmonary status limiting activity, Decreased activity tolerance, Decreased balance, Decreased endurance, Decreased knowledge of use of DME, Decreased  mobility, Difficulty walking, Decreased strength, Decreased scar mobility, Impaired flexibility,  Impaired perceived functional ability, Increased fascial restricitons, Pain  Visit Diagnosis: Muscle weakness (generalized)  Other abnormalities of gait and mobility     Problem List Patient Active Problem List   Diagnosis Date Noted   History of COVID-19 01/19/2021   Cough 01/19/2021   Dyspnea on exertion 01/19/2021   Olfactory impairment 01/19/2021   Irritable bowel syndrome with diarrhea 10/27/2018   Surgical menopause, symptomatic 02/12/2018   Obesity 07/03/2016   Status post laparoscopic hysterectomy 06/20/2015   Insomnia 04/14/2015   Asthma 04/14/2015   Bipolar disorder (HCC) 04/14/2015   Vitamin B12 deficiency 04/14/2015   Vitamin D deficiency 04/14/2015   Migraine 04/14/2015   Sleep apnea 04/14/2015    Eloy End, PT, DPT 03/16/21 11:24 AM  [facility] [address] [place], [place], [address] Phone: [phone]   Fax:  [phone]  Name: TACEY DIMAGGIO MRN: [id number] Date of Birth: 10/02/1980

## 2021-03-17 ENCOUNTER — Ambulatory Visit (INDEPENDENT_AMBULATORY_CARE_PROVIDER_SITE_OTHER): Payer: Commercial Managed Care - PPO | Admitting: Nurse Practitioner

## 2021-03-17 ENCOUNTER — Encounter: Payer: Self-pay | Admitting: Nurse Practitioner

## 2021-03-17 VITALS — BP 126/86 | HR 66 | Temp 98.5°F | Ht 67.56 in | Wt 230.2 lb

## 2021-03-17 DIAGNOSIS — R053 Chronic cough: Secondary | ICD-10-CM | POA: Diagnosis not present

## 2021-03-17 DIAGNOSIS — Z7689 Persons encountering health services in other specified circumstances: Secondary | ICD-10-CM

## 2021-03-17 MED ORDER — SAXENDA 18 MG/3ML ~~LOC~~ SOPN: 0.6000 mg | PEN_INJECTOR | Freq: Every day | SUBCUTANEOUS | 1 refills | Status: DC

## 2021-03-17 MED ORDER — ALBUTEROL SULFATE HFA 108 (90 BASE) MCG/ACT IN AERS: 2.0000 | INHALATION_SPRAY | Freq: Four times a day (QID) | RESPIRATORY_TRACT | 3 refills | Status: DC | PRN

## 2021-03-17 MED ORDER — BUPROPION HCL ER (XL) 300 MG PO TB24: 300.0000 mg | ORAL_TABLET | Freq: Every day | ORAL | 1 refills | Status: DC

## 2021-03-17 MED ORDER — SCOPOLAMINE 1 MG/3DAYS TD PT72: 1.0000 | MEDICATED_PATCH | TRANSDERMAL | 1 refills | Status: DC

## 2021-03-17 NOTE — Progress Notes (Signed)
BP 126/86   Pulse 66   Temp 98.5 F (36.9 C)   Ht 5' 7.56" (1.716 m)   Wt 230 lb 4 oz (104.4 kg)   LMP 06/01/2015 (Within Days)   SpO2 97%   BMI 35.47 kg/m    Subjective:    Patient ID: Emily [name], female    DOB: [birth year], 40 y.o.   MRN: [id number]  HPI: Emily [name] is a 40 y.o. female  Chief Complaint  Patient presents with   Weight Check   Manic Behavior   Medication Refill    albuterol   CHRONIC COUGH Patient saw the [place] [facility] who referred her to physical therapy to help with conditioning.  She was also referred to pulmonology for her chronic cough. She will see them in August.   WEIGHT LOSS Patient has not lost any weight. Patient would like to go back to the saxenda.  She is currently doing boot camp and working out with the physical therapist a couple of times per week.   Denies HA, CP, SOB, dizziness, palpitations, visual changes, and lower extremity swelling.  Relevant past medical, surgical, family and social history reviewed and updated as indicated. Interim medical history since our last visit reviewed. Allergies and medications reviewed and updated.  Review of Systems  Constitutional:  Positive for unexpected weight change.  Eyes:  Negative for visual disturbance.  Respiratory:  Positive for cough. Negative for chest tightness and shortness of breath.   Cardiovascular:  Negative for chest pain, palpitations and leg swelling.  Neurological:  Negative for dizziness and headaches.   Per HPI unless specifically indicated above     Objective:    BP 126/86   Pulse 66   Temp 98.5 F (36.9 C)   Ht 5' 7.56" (1.716 m)   Wt 230 lb 4 oz (104.4 kg)   LMP 06/01/2015 (Within Days)   SpO2 97%   BMI 35.47 kg/m   Wt Readings from Last 3 Encounters:  03/17/21 230 lb 4 oz (104.4 kg)  01/16/21 231 lb 8 oz (105 kg)  12/19/20 224 lb 2 oz (101.7 kg)    Physical Exam Vitals and nursing note reviewed.  Constitutional:      General: She is not  in acute distress.    Appearance: Normal appearance. She is obese. She is not ill-appearing, toxic-appearing or diaphoretic.  HENT:     Head: Normocephalic.     Right Ear: External ear normal.     Left Ear: External ear normal.     Nose: Nose normal.     Mouth/Throat:     Mouth: Mucous membranes are moist.     Pharynx: Oropharynx is clear.  Eyes:     General:        Right eye: No discharge.        Left eye: No discharge.     Extraocular Movements: Extraocular movements intact.     Conjunctiva/sclera: Conjunctivae normal.     Pupils: Pupils are equal, round, and reactive to light.  Cardiovascular:     Rate and Rhythm: Normal rate and regular rhythm.     Heart sounds: No murmur heard. Pulmonary:     Effort: Pulmonary effort is normal. No respiratory distress.     Breath sounds: Normal breath sounds. No wheezing or rales.  Musculoskeletal:     Cervical back: Normal range of motion and neck supple.  Skin:    General: Skin is warm and dry.     Capillary  Refill: Capillary refill takes less than 2 seconds.  Neurological:     General: No focal deficit present.     Mental Status: She is alert and oriented to person, place, and time. Mental status is at baseline.  Psychiatric:        Mood and Affect: Mood normal.        Behavior: Behavior normal.        Thought Content: Thought content normal.        Judgment: Judgment normal.    Results for orders placed or performed in visit on 12/19/20  Comp Met (CMET)  Result Value Ref Range   Glucose 80 65 - 99 mg/dL   BUN 10 6 - 24 mg/dL   Creatinine, Ser 1.21 (H) 0.57 - 1.00 mg/dL   eGFR 58 (L) >59 mL/min/1.73   BUN/Creatinine Ratio 8 (L) 9 - 23   Sodium 142 134 - 144 mmol/L   Potassium 5.1 3.5 - 5.2 mmol/L   Chloride 104 96 - 106 mmol/L   CO2 23 20 - 29 mmol/L   Calcium 9.6 8.7 - 10.2 mg/dL   Total Protein 6.9 6.0 - 8.5 g/dL   Albumin 4.3 3.8 - 4.8 g/dL   Globulin, Total 2.6 1.5 - 4.5 g/dL   Albumin/Globulin Ratio 1.7 1.2 - 2.2    Bilirubin Total 0.9 0.0 - 1.2 mg/dL   Alkaline Phosphatase 93 44 - 121 IU/L   AST 23 0 - 40 IU/L   ALT 17 0 - 32 IU/L  TSH  Result Value Ref Range   TSH 2.220 0.450 - 4.500 uIU/mL  CBC w/Diff  Result Value Ref Range   WBC 7.3 3.4 - 10.8 x10E3/uL   RBC 4.85 3.77 - 5.28 x10E6/uL   Hemoglobin 14.6 11.1 - 15.9 g/dL   Hematocrit 44.1 34.0 - 46.6 %   MCV 91 79 - 97 fL   MCH 30.1 26.6 - 33.0 pg   MCHC 33.1 31.5 - 35.7 g/dL   RDW 12.3 11.7 - 15.4 %   Platelets 253 150 - 450 x10E3/uL   Neutrophils 73 Not Estab. %   Lymphs 19 Not Estab. %   Monocytes 8 Not Estab. %   Eos 0 Not Estab. %   Basos 0 Not Estab. %   Neutrophils Absolute 5.2 1.4 - 7.0 x10E3/uL   Lymphocytes Absolute 1.4 0.7 - 3.1 x10E3/uL   Monocytes Absolute 0.6 0.1 - 0.9 x10E3/uL   EOS (ABSOLUTE) 0.0 0.0 - 0.4 x10E3/uL   Basophils Absolute 0.0 0.0 - 0.2 x10E3/uL   Immature Granulocytes 0 Not Estab. %   Immature Grans (Abs) 0.0 0.0 - 0.1 x10E3/uL  Vitamin D (25 hydroxy)  Result Value Ref Range   Vit D, 25-Hydroxy 72.3 30.0 - 100.0 ng/mL  B12  Result Value Ref Range   Vitamin B-12 1,379 (H) 232 - 1,245 pg/mL      Assessment & Plan:   Problem List Items Addressed This Visit   None Visit Diagnoses     Encounter for weight management    -  Primary   Patient restarted on Saxenda after a medication break. Discussed proper titration of medication. Will follow up in 1 month for reevaluation.    Chronic cough       Patient has been referred to Pulmonology. Continues to use improve and use albuterol PRN. Follow up in 3 months for reevaluation.        Follow up plan: Return in about 3 months (around 06/17/2021) for Weight Managment.

## 2021-03-21 ENCOUNTER — Ambulatory Visit: Payer: Commercial Managed Care - PPO

## 2021-03-21 ENCOUNTER — Other Ambulatory Visit: Payer: Self-pay

## 2021-03-21 DIAGNOSIS — M6281 Muscle weakness (generalized): Secondary | ICD-10-CM | POA: Diagnosis not present

## 2021-03-21 DIAGNOSIS — R2689 Other abnormalities of gait and mobility: Secondary | ICD-10-CM

## 2021-03-21 NOTE — Therapy (Signed)
[facility] Outpatient Rehabilitation [facility] [address] [place], [place], [address] Phone: [phone]   Fax:  [phone]  Physical Therapy Treatment  Patient Details  Name: Emily [name] MRN: [id number] Date of Birth: Feb 06, 1981 Referring Provider (PT): Ivonne Andrew, NP   Encounter Date: 03/21/2021   PT End of Session - 03/21/21 1126     Visit Number 4    Number of Visits 12    Date for PT Re-Evaluation 04/12/21    Authorization Type UHC    PT Start Time 1128    PT Stop Time 1208    PT Time Calculation (min) 40 min    Activity Tolerance Patient limited by pain;Patient tolerated treatment well    Behavior During Therapy WFL for tasks assessed/performed             Past Medical History:  Diagnosis Date   Anemia    Asthma    sports induced inhalers   Bipolar 1 disorder (HCC)    Conversion disorder    Insomnia    Migraines    migraines   MRSA infection 2010   Ovarian cyst    left 2.5cm   Sleep apnea    does not use a CPAP   Vitamin B 12 deficiency    Vitamin D deficiency     Past Surgical History:  Procedure Laterality Date   ABDOMINAL HYSTERECTOMY     BUNIONECTOMY     ESOPHAGOGASTRODUODENOSCOPY (EGD) WITH PROPOFOL N/A 11/03/2018   Procedure: ESOPHAGOGASTRODUODENOSCOPY (EGD) WITH PROPOFOL;  Surgeon: Wyline Mood, MD;  Location: [facility] ENDOSCOPY;  Service: Gastroenterology;  Laterality: N/A;   HAMMER TOE SURGERY     HAND SURGERY  2014   cyst removed   INTRAUTERINE DEVICE (IUD) INSERTION N/A 05/09/2015   Procedure: INTRAUTERINE DEVICE (IUD) INSERTION;  Surgeon: Hildred Laser, MD;  Location: [facility] ORS;  Service: Gynecology;  Laterality: N/A;   LAPAROSCOPIC ASSISTED VAGINAL HYSTERECTOMY N/A 06/20/2015   Procedure: LAPAROSCOPIC ASSISTED VAGINAL HYSTERECTOMY, BILATERAL SALPINGECTOMY, CYSTOSCOPY, IUD REMOVAL ;  Surgeon: Hildred Laser, MD;  Location: [facility] ORS;  Service: Gynecology;  Laterality: N/A;   LAPAROSCOPIC TUBAL LIGATION Bilateral  05/09/2015   Procedure: LAPAROSCOPIC TUBAL LIGATION;  Surgeon: Hildred Laser, MD;  Location: [facility] ORS;  Service: Gynecology;  Laterality: Bilateral;   NASAL SINUS SURGERY     TUBAL LIGATION  2007    There were no vitals filed for this visit.   Subjective Assessment - 03/21/21 1126     Subjective Pt presents to PT with reports of R knee pain and muscle soreness. She has been compliant with her HEP with no adverse effect. She is ready to begin PT at this time.    Currently in Pain? Yes    Pain Score 6     Pain Location Knee    Pain Orientation Right                               [facility] Adult PT Treatment/Exercise - 03/21/21 0001       Lumbar Exercises: Standing   Other Standing Lumbar Exercises pallof press 2x15 10lb    Other Standing Lumbar Exercises standing chop 10lb 2x10 ea      Knee/Hip Exercises: Aerobic   Nustep lvl 6 x 4 min UE/LE while taking subjective      Knee/Hip Exercises: Standing   Hip Extension 2 sets;15 reps    Extension Limitations red tband    Other Standing Knee Exercises  lateral walk red tband x 3 laps in //; monster walk red tband x 2 laps in //    Other Standing Knee Exercises RDL w/ 25lb KB 3x10                      PT Short Term Goals - 03/01/21 1500       PT SHORT TERM GOAL #1   Title Pt will be compliant and knowledgeable with initial HEP for improved carryover and strength    Baseline initial HEP given    Time 3    Period Weeks    Status New    Target Date 03/22/21      PT SHORT TERM GOAL #2   Title Pt will increase reps in 30 Second Chair Rise to <= 15 in order to show improved functional strength and mobility    Baseline 10 reps    Time 3    Period Weeks    Status New    Target Date 03/22/21               PT Long Term Goals - 03/01/21 1501       PT LONG TERM GOAL #1   Title Pt will improve FOTO score to >/= 57% in order to improvre confidence and functional ability    Baseline 48% function     Time 8    Period Weeks    Status New    Target Date 04/26/21      PT LONG TERM GOAL #2   Title Pt will be able to ambulate 1463ft during in order to improve endurance and functional ability    Baseline 1133ft    Time 6    Period Weeks    Status New    Target Date 04/26/21      PT LONG TERM GOAL #3   Title Pt will be able to lift 50lb box from floor to overhead with no increase in pain or fatigue in 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 She did have increased fatigue with deadlift in posterior chain muscles and lumbar extensors. Form improved with repeated verbal cues. Pt is progressing well with therapy and should continue to be seen and progressed as tolerated.    PT Treatment/Interventions ADLs/Self Care Home Management;Aquatic Therapy;Biofeedback;Cryotherapy;Electrical Stimulation;Moist Heat;Gait training;Stair training;Functional mobility training;Therapeutic activities;Therapeutic exercise;Balance training;Neuromuscular re-education;Manual techniques;Patient/family education;Passive range of motion    PT Next Visit Plan continue to progress strength and endurance as able    PT Home Exercise Plan Access Code: [email or web address]             Patient will benefit from skilled therapeutic intervention in order to improve the following deficits and impairments:  Abnormal gait, Cardiopulmonary status limiting activity, Decreased activity tolerance, Decreased balance, Decreased endurance, Decreased knowledge of use of DME, Decreased mobility, Difficulty walking, Decreased strength, Decreased scar mobility, Impaired flexibility, Impaired perceived functional ability, Increased fascial restricitons, Pain  Visit Diagnosis: Muscle weakness  (generalized)  Other abnormalities of gait and mobility     Problem List Patient Active Problem List   Diagnosis Date Noted  History of COVID-19 01/19/2021   Cough 01/19/2021   Dyspnea on exertion 01/19/2021   Olfactory impairment 01/19/2021   Irritable bowel syndrome with diarrhea 10/27/2018   Surgical menopause, symptomatic 02/12/2018   Obesity 07/03/2016   Status post laparoscopic hysterectomy 06/20/2015   Insomnia 04/14/2015   Asthma 04/14/2015   Bipolar disorder (HCC) 04/14/2015   Vitamin B12 deficiency 04/14/2015   Vitamin D deficiency 04/14/2015   Migraine 04/14/2015   Sleep apnea 04/14/2015    Eloy End, PT, DPT 03/21/21 12:11 PM  [facility] [address] [place], [place], [address] Phone: [phone]   Fax:  [phone]  Name: Emily [name] MRN: [id number] Date of Birth: [birth year]

## 2021-03-23 ENCOUNTER — Ambulatory Visit: Payer: Commercial Managed Care - PPO

## 2021-03-23 ENCOUNTER — Other Ambulatory Visit: Payer: Self-pay

## 2021-03-23 DIAGNOSIS — M6281 Muscle weakness (generalized): Secondary | ICD-10-CM | POA: Diagnosis not present

## 2021-03-23 DIAGNOSIS — R2689 Other abnormalities of gait and mobility: Secondary | ICD-10-CM

## 2021-03-23 NOTE — Therapy (Signed)
[facility] [address] [place], [place], [address] Phone: [phone]   Fax:  [phone]  Physical Therapy Treatment  Patient Details  Name: Emily [name] MRN: [id number] Date of Birth: [birth year] Referring Provider (PT): Ivonne Andrew, NP   Encounter Date: 03/23/2021   PT End of Session - 03/23/21 1042     Visit Number 5    Number of Visits 12    Date for PT Re-Evaluation 04/12/21    Authorization Type UHC    PT Start Time 1045    PT Stop Time 1125    PT Time Calculation (min) 40 min    Activity Tolerance Patient limited by pain;Patient tolerated treatment well    Behavior During Therapy WFL for tasks assessed/performed             Past Medical History:  Diagnosis Date   Anemia    Asthma    sports induced inhalers   Bipolar 1 disorder (HCC)    Conversion disorder    Insomnia    Migraines    migraines   MRSA infection 2010   Ovarian cyst    left 2.5cm   Sleep apnea    does not use a CPAP   Vitamin B 12 deficiency    Vitamin D deficiency     Past Surgical History:  Procedure Laterality Date   ABDOMINAL HYSTERECTOMY     BUNIONECTOMY     ESOPHAGOGASTRODUODENOSCOPY (EGD) WITH PROPOFOL N/A 11/03/2018   Procedure: ESOPHAGOGASTRODUODENOSCOPY (EGD) WITH PROPOFOL;  Surgeon: Wyline Mood, MD;  Location: [facility] ENDOSCOPY;  Service: Gastroenterology;  Laterality: N/A;   HAMMER TOE SURGERY     HAND SURGERY  2014   cyst removed   INTRAUTERINE DEVICE (IUD) INSERTION N/A 05/09/2015   Procedure: INTRAUTERINE DEVICE (IUD) INSERTION;  Surgeon: Hildred Laser, MD;  Location: [facility] ORS;  Service: Gynecology;  Laterality: N/A;   LAPAROSCOPIC ASSISTED VAGINAL HYSTERECTOMY N/A 06/20/2015   Procedure: LAPAROSCOPIC ASSISTED VAGINAL HYSTERECTOMY, BILATERAL SALPINGECTOMY, CYSTOSCOPY, IUD REMOVAL ;  Surgeon: Hildred Laser, MD;  Location: [facility] ORS;  Service: Gynecology;  Laterality: N/A;   LAPAROSCOPIC TUBAL LIGATION Bilateral  05/09/2015   Procedure: LAPAROSCOPIC TUBAL LIGATION;  Surgeon: Hildred Laser, MD;  Location: [facility] ORS;  Service: Gynecology;  Laterality: Bilateral;   NASAL SINUS SURGERY     TUBAL LIGATION  2007    There were no vitals filed for this visit.   Subjective Assessment - 03/23/21 1042     Subjective Pt presents to PT with reports of R knee pain after hitting her knee in the parking lot on the car door. She has continued to be compliant with her HEP with no adverse effect. Pt is ready to begin PT at this time.    Currently in Pain? Yes    Pain Score 7     Pain Location Knee    Pain Orientation Right                               [facility] Adult PT Treatment/Exercise - 03/23/21 0001       Knee/Hip Exercises: Aerobic   Nustep lvl 5 x 5 min UE/LE while taking subjective      Knee/Hip Exercises: Supine   Straight Leg Raises 3 sets;10 reps;Both      Knee/Hip Exercises: Sidelying   Clams 2x15 green tband      Modalities   Modalities Vasopneumatic      Vasopneumatic  Number Minutes Vasopneumatic  10 minutes    Vasopnuematic Location  Knee    Vasopneumatic Pressure Medium    Vasopneumatic Temperature  40 degrees                      PT Short Term Goals - 03/01/21 1500       PT SHORT TERM GOAL #1   Title Pt will be compliant and knowledgeable with initial HEP for improved carryover and strength    Baseline initial HEP given    Time 3    Period Weeks    Status New    Target Date 03/22/21      PT SHORT TERM GOAL #2   Title Pt will increase reps in 30 Second Chair Rise to <= 15 in order to show improved functional strength and mobility    Baseline 10 reps    Time 3    Period Weeks    Status New    Target Date 03/22/21               PT Long Term Goals - 03/01/21 1501       PT LONG TERM GOAL #1   Title Pt will improve FOTO score to >/= 57% in order to improvre confidence and functional ability    Baseline 48% function    Time 8     Period Weeks    Status New    Target Date 04/26/21      PT LONG TERM GOAL #2   Title Pt will be able to ambulate 1441ft during in order to improve endurance and functional ability    Baseline 1172ft    Time 6    Period Weeks    Status New    Target Date 04/26/21      PT LONG TERM GOAL #3   Title Pt will be able to lift 50lb box from floor to overhead with no increase in pain or fatigue 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 She responded well to vaso device for decreasing swelling and R knee pain. Pt continues to benefit from skilled PT sevices working on improving strength and activity tolerance post covid. Will continue to progress as able.    PT Treatment/Interventions ADLs/Self Care Home Management;Aquatic Therapy;Biofeedback;Cryotherapy;Electrical Stimulation;Moist Heat;Gait training;Stair training;Functional mobility training;Therapeutic activities;Therapeutic exercise;Balance training;Neuromuscular re-education;Manual techniques;Patient/family education;Passive range of motion    PT Next Visit Plan continue to progress strength and endurance as able    PT Home Exercise Plan Access Code: [email or web address]             Patient will benefit from skilled therapeutic intervention in order to improve the following deficits and impairments:  Abnormal gait, Cardiopulmonary status limiting activity, Decreased activity tolerance, Decreased balance, Decreased endurance, Decreased knowledge of use of DME, Decreased mobility, Difficulty walking, Decreased strength, Decreased scar mobility, Impaired flexibility, Impaired perceived functional ability, Increased fascial restricitons, Pain  Visit Diagnosis: Muscle weakness  (generalized)  Other abnormalities of gait and mobility     Problem List Patient Active Problem List   Diagnosis Date  Noted   History of COVID-19 01/19/2021   Cough 01/19/2021   Dyspnea on exertion 01/19/2021   Olfactory impairment 01/19/2021   Irritable bowel syndrome with diarrhea 10/27/2018   Surgical menopause, symptomatic 02/12/2018   Obesity 07/03/2016   Status post laparoscopic hysterectomy 06/20/2015   Insomnia 04/14/2015   Asthma 04/14/2015   Bipolar disorder (HCC) 04/14/2015   Vitamin B12 deficiency 04/14/2015   Vitamin D deficiency 04/14/2015   Migraine 04/14/2015   Sleep apnea 04/14/2015    Eloy End, PT, DPT 03/23/21 12:09 PM  [facility] [address] [place], [place], [address] Phone: [phone]   Fax:  [phone]  Name: Emily [name] MRN: [id number] Date of Birth: 1981/07/20

## 2021-03-28 ENCOUNTER — Other Ambulatory Visit: Payer: Self-pay

## 2021-03-28 ENCOUNTER — Ambulatory Visit: Payer: Commercial Managed Care - PPO | Attending: Nurse Practitioner

## 2021-03-28 DIAGNOSIS — R2689 Other abnormalities of gait and mobility: Secondary | ICD-10-CM | POA: Diagnosis present

## 2021-03-28 DIAGNOSIS — M6281 Muscle weakness (generalized): Secondary | ICD-10-CM | POA: Diagnosis present

## 2021-03-28 NOTE — Therapy (Signed)
[facility] [address] [place], [place], [address] Phone: [phone]   Fax:  [phone]  Physical Therapy Treatment  Patient Details  Name: Emily [name] MRN: [id number] Date of Birth: 1980-12-28 Referring Provider (PT): Ivonne Andrew, NP   Encounter Date: 03/28/2021   PT End of Session - 03/28/21 1359     Visit Number 6    Number of Visits 12    Date for PT Re-Evaluation 04/12/21    Authorization Type UHC    PT Start Time 1358    PT Stop Time 0248    PT Time Calculation (min) 770 min    Activity Tolerance Patient limited by pain;Patient tolerated treatment well    Behavior During Therapy WFL for tasks assessed/performed             Past Medical History:  Diagnosis Date   Anemia    Asthma    sports induced inhalers   Bipolar 1 disorder (HCC)    Conversion disorder    Insomnia    Migraines    migraines   MRSA infection 2010   Ovarian cyst    left 2.5cm   Sleep apnea    does not use a CPAP   Vitamin B 12 deficiency    Vitamin D deficiency     Past Surgical History:  Procedure Laterality Date   ABDOMINAL HYSTERECTOMY     BUNIONECTOMY     ESOPHAGOGASTRODUODENOSCOPY (EGD) WITH PROPOFOL N/A 11/03/2018   Procedure: ESOPHAGOGASTRODUODENOSCOPY (EGD) WITH PROPOFOL;  Surgeon: Wyline Mood, MD;  Location: [facility] ENDOSCOPY;  Service: Gastroenterology;  Laterality: N/A;   HAMMER TOE SURGERY     HAND SURGERY  2014   cyst removed   INTRAUTERINE DEVICE (IUD) INSERTION N/A 05/09/2015   Procedure: INTRAUTERINE DEVICE (IUD) INSERTION;  Surgeon: Hildred Laser, MD;  Location: [facility] ORS;  Service: Gynecology;  Laterality: N/A;   LAPAROSCOPIC ASSISTED VAGINAL HYSTERECTOMY N/A 06/20/2015   Procedure: LAPAROSCOPIC ASSISTED VAGINAL HYSTERECTOMY, BILATERAL SALPINGECTOMY, CYSTOSCOPY, IUD REMOVAL ;  Surgeon: Hildred Laser, MD;  Location: [facility] ORS;  Service: Gynecology;  Laterality: N/A;   LAPAROSCOPIC TUBAL LIGATION Bilateral  05/09/2015   Procedure: LAPAROSCOPIC TUBAL LIGATION;  Surgeon: Hildred Laser, MD;  Location: [facility] ORS;  Service: Gynecology;  Laterality: Bilateral;   NASAL SINUS SURGERY     TUBAL LIGATION  2007    There were no vitals filed for this visit.   Subjective Assessment - 03/28/21 1400     Subjective Pt presents to PT with continued reports of R knee pain. She has been compliant with her HEP with no adverse effect. Pt is ready to begin PT treatment at this time.    Currently in Pain? Yes    Pain Score 7     Pain Location Knee    Pain Orientation Right                [facility] PT Assessment - 03/28/21 0001       Observation/Other Assessments   Focus on Therapeutic Outcomes (FOTO)  60% function; 57% predicted                           [facility] Adult PT Treatment/Exercise - 03/28/21 0001       Lumbar Exercises: Aerobic   Nustep lvl 6 x 5 min UE/LE while taking subjective      Lumbar Exercises: Standing   Other Standing Lumbar Exercises pallof walkout x 10 7lb ea  Lumbar Exercises: Supine   Isometric Hip Flexion Limitations 2x20 sec    Other Supine Lumbar Exercises supine 90/90 alternating march 2x10      Knee/Hip Exercises: Supine   Straight Leg Raises 2 sets;10 reps      Knee/Hip Exercises: Prone   Other Prone Exercises prone hip ext w/ knee bent x 10 ea      Shoulder Exercises: Supine   Horizontal ABduction Limitations 2x10 blue tband      Vasopneumatic   Number Minutes Vasopneumatic  10 minutes    Vasopnuematic Location  Knee    Vasopneumatic Pressure Medium    Vasopneumatic Temperature  38 degrees                      PT Short Term Goals - 03/28/21 1452       PT SHORT TERM GOAL #1   Title Pt will be compliant and knowledgeable with initial HEP for improved carryover and strength    Baseline initial HEP given    Time 3    Period Weeks    Status Achieved    Target Date 03/22/21      PT SHORT TERM GOAL #2   Title Pt will  increase reps in 30 Second Chair Rise to <= 15 in order to show improved functional strength and mobility    Baseline 10 reps; update - 15 reps on 03/28/21    Time 3    Period Weeks    Status Achieved    Target Date 03/22/21               PT Long Term Goals - 03/28/21 1452       PT LONG TERM GOAL #1   Title Pt will improve FOTO score to >/= 57% in order to improvre confidence and functional ability    Baseline 48% function; update 60% on 03/28/21    Time 8    Period Weeks    Status Achieved      PT LONG TERM GOAL #2   Title Pt will be able to ambulate 1411ft during in order to improve endurance and functional ability    Baseline 1115ft    Time 6    Period Weeks    Status On-going      PT LONG TERM GOAL #3   Title Pt will be able to lift 50lb box from floor to overhead with no increase in pain or fatigue 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 She does have continued R knee pain but responds well to vaso for decreasing pain. Pt is progressing well with therapy overall, meeting her LTG for FOTO at 60% and increasing reps in 30 Sec STS today, meeting her goal for this measure as well. PT will continue to progress strengthening and endurance exercises as able per POC as prescribed.    PT Treatment/Interventions ADLs/Self Care Home Management;Aquatic Therapy;Biofeedback;Cryotherapy;Electrical Stimulation;Moist Heat;Gait training;Stair training;Functional mobility training;Therapeutic activities;Therapeutic exercise;Balance training;Neuromuscular re-education;Manual techniques;Patient/family education;Passive range of motion    PT Next Visit Plan continue to progress strength and endurance as able    PT Home  Exercise Plan Access Code:  [email or web address]             Patient will benefit from skilled therapeutic intervention in order to improve the following deficits and impairments:  Abnormal gait, Cardiopulmonary status limiting activity, Decreased activity tolerance, Decreased balance, Decreased endurance, Decreased knowledge of use of DME, Decreased mobility, Difficulty walking, Decreased strength, Decreased scar mobility, Impaired flexibility, Impaired perceived functional ability, Increased fascial restricitons, Pain  Visit Diagnosis: Muscle weakness (generalized)  Other abnormalities of gait and mobility     Problem List Patient Active Problem List   Diagnosis Date Noted   History of COVID-19 01/19/2021   Cough 01/19/2021   Dyspnea on exertion 01/19/2021   Olfactory impairment 01/19/2021   Irritable bowel syndrome with diarrhea 10/27/2018   Surgical menopause, symptomatic 02/12/2018   Obesity 07/03/2016   Status post laparoscopic hysterectomy 06/20/2015   Insomnia 04/14/2015   Asthma 04/14/2015   Bipolar disorder (HCC) 04/14/2015   Vitamin B12 deficiency 04/14/2015   Vitamin D deficiency 04/14/2015   Migraine 04/14/2015   Sleep apnea 04/14/2015    Eloy End, PT, DPT 03/28/21 2:53 PM  [facility] [address] [place], [place], [address] Phone: [phone]   Fax:  [phone]  Name: Emily [name] MRN: [id number] Date of Birth: [birth year]

## 2021-03-30 ENCOUNTER — Ambulatory Visit: Payer: Commercial Managed Care - PPO

## 2021-04-04 ENCOUNTER — Ambulatory Visit: Payer: Commercial Managed Care - PPO

## 2021-04-06 ENCOUNTER — Ambulatory Visit: Payer: Commercial Managed Care - PPO

## 2021-04-06 ENCOUNTER — Other Ambulatory Visit: Payer: Self-pay

## 2021-04-06 DIAGNOSIS — M6281 Muscle weakness (generalized): Secondary | ICD-10-CM

## 2021-04-06 DIAGNOSIS — R2689 Other abnormalities of gait and mobility: Secondary | ICD-10-CM

## 2021-04-06 NOTE — Therapy (Signed)
[facility] [address] [place], [place], [address] Phone: [phone]   Fax:  [phone]  Physical Therapy Treatment  Patient Details  Name: Emily [name] MRN: [id number] Date of Birth: [birth year] Referring Provider (PT): Ivonne Andrew, NP   Encounter Date: 04/06/2021   PT End of Session - 04/06/21 1048     Visit Number 7    Number of Visits 12    Date for PT Re-Evaluation 04/12/21    Authorization Type UHC    PT Start Time 1046    PT Stop Time 1128    PT Time Calculation (min) 42 min    Activity Tolerance Patient limited by pain;Patient tolerated treatment well    Behavior During Therapy WFL for tasks assessed/performed             Past Medical History:  Diagnosis Date   Anemia    Asthma    sports induced inhalers   Bipolar 1 disorder (HCC)    Conversion disorder    Insomnia    Migraines    migraines   MRSA infection 2010   Ovarian cyst    left 2.5cm   Sleep apnea    does not use a CPAP   Vitamin B 12 deficiency    Vitamin D deficiency     Past Surgical History:  Procedure Laterality Date   ABDOMINAL HYSTERECTOMY     BUNIONECTOMY     ESOPHAGOGASTRODUODENOSCOPY (EGD) WITH PROPOFOL N/A 11/03/2018   Procedure: ESOPHAGOGASTRODUODENOSCOPY (EGD) WITH PROPOFOL;  Surgeon: Wyline Mood, MD;  Location: [facility] ENDOSCOPY;  Service: Gastroenterology;  Laterality: N/A;   HAMMER TOE SURGERY     HAND SURGERY  2014   cyst removed   INTRAUTERINE DEVICE (IUD) INSERTION N/A 05/09/2015   Procedure: INTRAUTERINE DEVICE (IUD) INSERTION;  Surgeon: Hildred Laser, MD;  Location: [facility] ORS;  Service: Gynecology;  Laterality: N/A;   LAPAROSCOPIC ASSISTED VAGINAL HYSTERECTOMY N/A 06/20/2015   Procedure: LAPAROSCOPIC ASSISTED VAGINAL HYSTERECTOMY, BILATERAL SALPINGECTOMY, CYSTOSCOPY, IUD REMOVAL ;  Surgeon: Hildred Laser, MD;  Location: [facility] ORS;  Service: Gynecology;  Laterality: N/A;   LAPAROSCOPIC TUBAL LIGATION Bilateral  05/09/2015   Procedure: LAPAROSCOPIC TUBAL LIGATION;  Surgeon: Hildred Laser, MD;  Location: [facility] ORS;  Service: Gynecology;  Laterality: Bilateral;   NASAL SINUS SURGERY     TUBAL LIGATION  2007    There were no vitals filed for this visit.   Subjective Assessment - 04/06/21 1048     Subjective Pt presents to PT with reports of continued R knee and lower back pain. She has been compliant with her HEP with no adverse effects noted. Pt is ready to begin PT treatment at this time.    Currently in Pain? Yes    Pain Score 7    also 7/10 in lower back   Pain Location Knee    Pain Orientation Right                               [facility] Adult PT Treatment/Exercise - 04/06/21 0001       Lumbar Exercises: Aerobic   Nustep lvl 6 x 5 min UE/LE while taking subjective      Lumbar Exercises: Standing   Other Standing Lumbar Exercises pallof press x 15 10lbs ea      Lumbar Exercises: Supine   Isometric Hip Flexion Limitations 3x20 sec      Knee/Hip Exercises: Standing   Forward Step Up 15  reps;Step Height: 8"      Knee/Hip Exercises: Supine   Straight Leg Raises 15 reps;Both      Knee/Hip Exercises: Sidelying   Clams 2x15 green tband      Manual Therapy   Manual Therapy Taping    McConnell R knee with medial pull; PT educated pt and showed materials to use for home application                      PT Short Term Goals - 03/28/21 1452       PT SHORT TERM GOAL #1   Title Pt will be compliant and knowledgeable with initial HEP for improved carryover and strength    Baseline initial HEP given    Time 3    Period Weeks    Status Achieved    Target Date 03/22/21      PT SHORT TERM GOAL #2   Title Pt will increase reps in 30 Second Chair Rise to <= 15 in order to show improved functional strength and mobility    Baseline 10 reps; update - 15 reps on 03/28/21    Time 3    Period Weeks    Status Achieved    Target Date 03/22/21               PT  Long Term Goals - 03/28/21 1452       PT LONG TERM GOAL #1   Title Pt will improve FOTO score to >/= 57% in order to improvre confidence and functional ability    Baseline 48% function; update 60% on 03/28/21    Time 8    Period Weeks    Status Achieved      PT LONG TERM GOAL #2   Title Pt will be able to ambulate 1455ft during in order to improve endurance and functional ability    Baseline 1124ft    Time 6    Period Weeks    Status On-going      PT LONG TERM GOAL #3   Title Pt will be able to lift 50lb box from floor to overhead with no increase in pain or fatigue 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 She responded well to McConnell taping to R knee with decreased pain noted the rest of session. She continues ot benefit from skilled PT services and is making good progress. Will assess goals at next session.    PT Treatment/Interventions ADLs/Self Care Home Management;Aquatic Therapy;Biofeedback;Cryotherapy;Electrical Stimulation;Moist Heat;Gait training;Stair training;Functional mobility training;Therapeutic activities;Therapeutic exercise;Balance training;Neuromuscular re-education;Manual techniques;Patient/family education;Passive range of motion    PT Next Visit Plan assess goals; continue to progress strength and endurance as able    PT Home Exercise Plan Access Code: [email or web address]             Patient will benefit from skilled therapeutic intervention in order to improve the following deficits and impairments:  Abnormal gait, Cardiopulmonary status limiting activity, Decreased activity tolerance, Decreased balance, Decreased endurance, Decreased knowledge of use of DME, Decreased  mobility, Difficulty walking, Decreased strength,  Decreased scar mobility, Impaired flexibility, Impaired perceived functional ability, Increased fascial restricitons, Pain  Visit Diagnosis: Muscle weakness (generalized)  Other abnormalities of gait and mobility     Problem List Patient Active Problem List   Diagnosis Date Noted   History of COVID-19 01/19/2021   Cough 01/19/2021   Dyspnea on exertion 01/19/2021   Olfactory impairment 01/19/2021   Irritable bowel syndrome with diarrhea 10/27/2018   Surgical menopause, symptomatic 02/12/2018   Obesity 07/03/2016   Status post laparoscopic hysterectomy 06/20/2015   Insomnia 04/14/2015   Asthma 04/14/2015   Bipolar disorder (HCC) 04/14/2015   Vitamin B12 deficiency 04/14/2015   Vitamin D deficiency 04/14/2015   Migraine 04/14/2015   Sleep apnea 04/14/2015    Eloy End, PT, DPT 04/06/21 12:11 PM  [facility] [address] [place], [place], [address] Phone: [phone]   Fax:  [phone]  Name: Emily [name] MRN: [id number] Date of Birth: 27-Jun-1981

## 2021-04-10 ENCOUNTER — Ambulatory Visit: Payer: Commercial Managed Care - PPO

## 2021-04-10 ENCOUNTER — Other Ambulatory Visit: Payer: Self-pay

## 2021-04-10 DIAGNOSIS — M6281 Muscle weakness (generalized): Secondary | ICD-10-CM

## 2021-04-10 DIAGNOSIS — R2689 Other abnormalities of gait and mobility: Secondary | ICD-10-CM

## 2021-04-10 NOTE — Addendum Note (Signed)
Addended by: Eloy End on: 04/10/2021 11:58 AM   Modules accepted: Orders

## 2021-04-10 NOTE — Therapy (Signed)
[facility] [address] [place], [place], [address] Phone: [phone]   Fax:  [phone]  Physical Therapy Treatment  Patient Details  Name: Emily [name] MRN: [id number] Date of Birth: 1981/06/01 Referring Provider (PT): Ivonne Andrew, NP   Encounter Date: 04/10/2021   PT End of Session - 04/10/21 1145     Visit Number 8    Number of Visits 12    Date for PT Re-Evaluation 04/12/21    Authorization Type UHC    PT Start Time 0915    PT Stop Time 1000    PT Time Calculation (min) 45 min    Activity Tolerance Patient limited by pain;Patient tolerated treatment well    Behavior During Therapy WFL for tasks assessed/performed             Past Medical History:  Diagnosis Date   Anemia    Asthma    sports induced inhalers   Bipolar 1 disorder (HCC)    Conversion disorder    Insomnia    Migraines    migraines   MRSA infection 2010   Ovarian cyst    left 2.5cm   Sleep apnea    does not use a CPAP   Vitamin B 12 deficiency    Vitamin D deficiency     Past Surgical History:  Procedure Laterality Date   ABDOMINAL HYSTERECTOMY     BUNIONECTOMY     ESOPHAGOGASTRODUODENOSCOPY (EGD) WITH PROPOFOL N/A 11/03/2018   Procedure: ESOPHAGOGASTRODUODENOSCOPY (EGD) WITH PROPOFOL;  Surgeon: Wyline Mood, MD;  Location: [facility] ENDOSCOPY;  Service: Gastroenterology;  Laterality: N/A;   HAMMER TOE SURGERY     HAND SURGERY  2014   cyst removed   INTRAUTERINE DEVICE (IUD) INSERTION N/A 05/09/2015   Procedure: INTRAUTERINE DEVICE (IUD) INSERTION;  Surgeon: Hildred Laser, MD;  Location: [facility] ORS;  Service: Gynecology;  Laterality: N/A;   LAPAROSCOPIC ASSISTED VAGINAL HYSTERECTOMY N/A 06/20/2015   Procedure: LAPAROSCOPIC ASSISTED VAGINAL HYSTERECTOMY, BILATERAL SALPINGECTOMY, CYSTOSCOPY, IUD REMOVAL ;  Surgeon: Hildred Laser, MD;  Location: [facility] ORS;  Service: Gynecology;  Laterality: N/A;   LAPAROSCOPIC TUBAL LIGATION Bilateral  05/09/2015   Procedure: LAPAROSCOPIC TUBAL LIGATION;  Surgeon: Hildred Laser, MD;  Location: [facility] ORS;  Service: Gynecology;  Laterality: Bilateral;   NASAL SINUS SURGERY     TUBAL LIGATION  2007    There were no vitals filed for this visit.   Subjective Assessment - 04/10/21 1144     Subjective Pt presents to PT with continued reports of R knee pain, although lower back pain is decreased at present. She has been compliant with HEP. Pt is ready to begin PT treatment at this time.    Currently in Pain? Yes    Pain Score 7     Pain Location Knee    Pain Orientation Right                [facility] PT Assessment - 04/10/21 0001       Observation/Other Assessments   Observations able to lift 40lb box from floor to chest height; unable to increase to overhead      6 minute walk test results    Aerobic Endurance Distance Walked 1103    Endurance additional comments decreased functional endurance, pt subjective report of increased faitgue, dyspnea                           [facility] Adult PT Treatment/Exercise - 04/10/21 0001  Lumbar Exercises: Aerobic   Nustep lvl 6 x 5 min UE/LE while taking subjective      Lumbar Exercises: Standing   Other Standing Lumbar Exercises KB deadlift 2x10 15lb to 12in box      Vasopneumatic   Number Minutes Vasopneumatic  10 minutes    Vasopnuematic Location  Knee    Vasopneumatic Pressure Medium    Vasopneumatic Temperature  34 degrees                    PT Education - 04/10/21 1151     Education Details HEP    Person(s) Educated Patient    Methods Handout;Demonstration;Explanation    Comprehension Returned demonstration;Verbalized understanding              PT Short Term Goals - 03/28/21 1452       PT SHORT TERM GOAL #1   Title Pt will be compliant and knowledgeable with initial HEP for improved carryover and strength    Baseline initial HEP given    Time 3    Period Weeks    Status Achieved     Target Date 03/22/21      PT SHORT TERM GOAL #2   Title Pt will increase reps in 30 Second Chair Rise to <= 15 in order to show improved functional strength and mobility    Baseline 10 reps; update - 15 reps on 03/28/21    Time 3    Period Weeks    Status Achieved    Target Date 03/22/21               PT Long Term Goals - 04/10/21 1146       PT LONG TERM GOAL #1   Title Pt will improve FOTO score to >/= 57% in order to improvre confidence and functional ability    Baseline 48% function; update 60% on 03/28/21    Time 8    Period Weeks    Status Achieved      PT LONG TERM GOAL #2   Title Pt will be able to ambulate 1428ft during in order to improve endurance and functional ability    Baseline 1151ft; update 7/18: 1141ft    Time 6    Period Weeks    Status On-going    Target Date 06/05/21      PT LONG TERM GOAL #3   Title Pt will be able to lift 50lb box from floor to overhead with no increase in pain or fatigue in 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 Pt continues to progress with therapy, but still has lingering symptoms from previous episode of Covid-19. HEP updated to continue working on decreasing pain when lifting as well as PT education on continued adherence to walk/job program. Pt will take a few weeks off for upcoming scheduled vacation, with additonal visit scheduled for when she returns. Will assess response to HEP at that time and determine next  steps in POC.    PT Treatment/Interventions ADLs/Self Care Home Management;Aquatic  Therapy;Biofeedback;Cryotherapy;Electrical Stimulation;Moist Heat;Gait training;Stair training;Functional mobility training;Therapeutic activities;Therapeutic exercise;Balance training;Neuromuscular re-education;Manual techniques;Patient/family education;Passive range of motion    PT Next Visit Plan assess response to HEP over vacation; continue to progress as tolerated    PT Home Exercise Plan Access Code: [email or web address]             Patient will benefit from skilled therapeutic intervention in order to improve the following deficits and impairments:  Abnormal gait, Cardiopulmonary status limiting activity, Decreased activity tolerance, Decreased balance, Decreased endurance, Decreased knowledge of use of DME, Decreased mobility, Difficulty walking, Decreased strength, Decreased scar mobility, Impaired flexibility, Impaired perceived functional ability, Increased fascial restricitons, Pain  Visit Diagnosis: Muscle weakness (generalized)  Other abnormalities of gait and mobility     Problem List Patient Active Problem List   Diagnosis Date Noted   History of COVID-19 01/19/2021   Cough 01/19/2021   Dyspnea on exertion 01/19/2021   Olfactory impairment 01/19/2021   Irritable bowel syndrome with diarrhea 10/27/2018   Surgical menopause, symptomatic 02/12/2018   Obesity 07/03/2016   Status post laparoscopic hysterectomy 06/20/2015   Insomnia 04/14/2015   Asthma 04/14/2015   Bipolar disorder (HCC) 04/14/2015   Vitamin B12 deficiency 04/14/2015   Vitamin D deficiency 04/14/2015   Migraine 04/14/2015   Sleep apnea 04/14/2015    Eloy End, PT, DPT 04/10/21 12:02 PM  [facility] [address] [place], [place], [address] Phone: [phone]   Fax:  [phone]  Name: Emily [name] MRN: [id number] Date of Birth: [birth year]

## 2021-04-12 ENCOUNTER — Telehealth: Payer: Self-pay

## 2021-04-12 NOTE — Telephone Encounter (Signed)
PA submitted via cover my meds for Saxenda. Will await approval or denial  Key: [id number]

## 2021-05-01 ENCOUNTER — Ambulatory Visit: Payer: Commercial Managed Care - PPO

## 2021-05-09 ENCOUNTER — Ambulatory Visit: Payer: Commercial Managed Care - PPO | Attending: Nurse Practitioner

## 2021-05-09 ENCOUNTER — Other Ambulatory Visit: Payer: Self-pay

## 2021-05-09 DIAGNOSIS — M6281 Muscle weakness (generalized): Secondary | ICD-10-CM

## 2021-05-09 DIAGNOSIS — R2689 Other abnormalities of gait and mobility: Secondary | ICD-10-CM

## 2021-05-09 NOTE — Therapy (Addendum)
[facility] [address] [place], [place], [address] Phone: [phone]   Fax:  [phone]  Physical Therapy Treatment/Discharge  Patient Details  Name: Emily [name] MRN: [id number] Date of Birth: [birth year] Referring Provider (PT): Ivonne Andrew, NP   Encounter Date: 05/09/2021   PT End of Session - 05/09/21 0910     Visit Number 9    Number of Visits 12    Date for PT Re-Evaluation 07/04/21    Authorization Type UHC    PT Start Time 0915    PT Stop Time 1008    PT Time Calculation (min) 53 min    Activity Tolerance Patient limited by pain;Patient tolerated treatment well    Behavior During Therapy WFL for tasks assessed/performed             Past Medical History:  Diagnosis Date   Anemia    Asthma    sports induced inhalers   Bipolar 1 disorder (HCC)    Conversion disorder    Insomnia    Migraines    migraines   MRSA infection 2010   Ovarian cyst    left 2.5cm   Sleep apnea    does not use a CPAP   Vitamin B 12 deficiency    Vitamin D deficiency     Past Surgical History:  Procedure Laterality Date   ABDOMINAL HYSTERECTOMY     BUNIONECTOMY     ESOPHAGOGASTRODUODENOSCOPY (EGD) WITH PROPOFOL N/A 11/03/2018   Procedure: ESOPHAGOGASTRODUODENOSCOPY (EGD) WITH PROPOFOL;  Surgeon: Wyline Mood, MD;  Location: [facility] ENDOSCOPY;  Service: Gastroenterology;  Laterality: N/A;   HAMMER TOE SURGERY     HAND SURGERY  2014   cyst removed   INTRAUTERINE DEVICE (IUD) INSERTION N/A 05/09/2015   Procedure: INTRAUTERINE DEVICE (IUD) INSERTION;  Surgeon: Hildred Laser, MD;  Location: [facility] ORS;  Service: Gynecology;  Laterality: N/A;   LAPAROSCOPIC ASSISTED VAGINAL HYSTERECTOMY N/A 06/20/2015   Procedure: LAPAROSCOPIC ASSISTED VAGINAL HYSTERECTOMY, BILATERAL SALPINGECTOMY, CYSTOSCOPY, IUD REMOVAL ;  Surgeon: Hildred Laser, MD;  Location: [facility] ORS;  Service: Gynecology;  Laterality: N/A;   LAPAROSCOPIC TUBAL LIGATION  Bilateral 05/09/2015   Procedure: LAPAROSCOPIC TUBAL LIGATION;  Surgeon: Hildred Laser, MD;  Location: [facility] ORS;  Service: Gynecology;  Laterality: Bilateral;   NASAL SINUS SURGERY     TUBAL LIGATION  2007    There were no vitals filed for this visit.   Subjective Assessment - 05/09/21 0911     Subjective Pt presents to PT with reports of continued R knee pain after twisting it while running in 5k race a few weeks ago. She has been very compliant with HEP and feels that this has been going well and her endurance is improving. She does note that her endurance is still not quite what it was pre-covid infection, along with recent onsets of musculoskeletal pain and discomfort. Pt is ready to begin PT treatment at this time.    Currently in Pain? Yes    Pain Score 6     Pain Location Knee    Pain Orientation Right               [facility] Adult PT Treatment/Exercise:   Therapeutic Exercise: NuStep lvl 5 x 5 min UE/LE while taking subjective Rows 2x15 13lb at freemotion Step up L only x 15 8in step Lateral walk red tband x 4 laps in // Monster walk red tband x 2 laps in // Standing hip ext 2x15 red tband ea Modified side plank  2x20 sec ea Bridge with clam for ER 2x15 blue tband Supine unilateral clam blue tband 2x10 ea Modalities: GameReady 10 minutes 36 degrees Right Knee                          PT Short Term Goals - 03/28/21 1452       PT SHORT TERM GOAL #1   Title Pt will be compliant and knowledgeable with initial HEP for improved carryover and strength    Baseline initial HEP given    Time 3    Period Weeks    Status Achieved    Target Date 03/22/21      PT SHORT TERM GOAL #2   Title Pt will increase reps in 30 Second Chair Rise to <= 15 in order to show improved functional strength and mobility    Baseline 10 reps; update - 15 reps on 03/28/21    Time 3    Period Weeks    Status Achieved    Target Date 03/22/21               PT Long  Term Goals - 05/09/21 1224       PT LONG TERM GOAL #1   Title Pt will improve FOTO score to >/= 57% in order to improvre confidence and functional ability    Baseline 48% function; update 60% on 03/28/21    Time 8    Period Weeks    Status Achieved      PT LONG TERM GOAL #2   Title Pt will be able to ambulate 1468ft during in order to improve endurance and functional ability    Baseline 1121ft; update 7/18: 1148ft    Time 8    Period Weeks    Status On-going    Target Date 07/04/21      PT LONG TERM GOAL #3   Title Pt will be able to lift 50lb box from floor to overhead with no increase in pain or fatigue in 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 She has continued to improve since Covid-19 infection earlier this year, with improving functional endurance noted. She does however continue to be operating below PLOF and has had recent onsets of musculoskeletal pain that has limited participation with home and community activities as well as with physical therapy.    Personal Factors and Comorbidities Time since onset of injury/illness/exacerbation;Other;Comorbidity 3+    Comorbidities athsma, conversion disorder, Bipolar 1 disorder, anemia, Insomnia    Examination-Activity Limitations Bed Mobility;Bend;Carry;Dressing;Sit;Transfers;Stand;Stairs;Squat;Lift;Locomotion Level    PT Frequency Biweekly    PT Duration 8 weeks    PT Treatment/Interventions ADLs/Self Care Home Management;Aquatic Therapy;Biofeedback;Cryotherapy;Electrical Stimulation;Moist Heat;Gait training;Stair training;Functional mobility training;Therapeutic activities;Therapeutic  exercise;Balance training;Neuromuscular re-education;Manual techniques;Patient/family education;Passive range of motion    PT Next Visit Plan assess continued response to HEP; progress functional endurance and strength as able    PT Home Exercise Plan Access Code: [email or web address]    Consulted and Agree with Plan of Care  Patient             Patient will benefit from skilled therapeutic intervention in order to improve the following deficits and impairments:  Abnormal gait, Cardiopulmonary status limiting activity, Decreased activity tolerance, Decreased balance, Decreased endurance, Decreased knowledge of use of DME, Decreased mobility, Difficulty walking, Decreased strength, Decreased scar mobility, Impaired flexibility, Impaired perceived functional ability, Increased fascial restricitons, Pain  Visit Diagnosis: Muscle weakness (generalized)  Other abnormalities of gait and mobility     Problem List Patient Active Problem List   Diagnosis Date Noted   History of COVID-19 01/19/2021   Cough 01/19/2021   Dyspnea on exertion 01/19/2021   Olfactory impairment 01/19/2021   Irritable bowel syndrome with diarrhea 10/27/2018   Surgical menopause, symptomatic 02/12/2018   Obesity 07/03/2016   Status post laparoscopic hysterectomy 06/20/2015   Insomnia 04/14/2015   Asthma 04/14/2015   Bipolar disorder (HCC) 04/14/2015   Vitamin B12 deficiency 04/14/2015   Vitamin D deficiency 04/14/2015   Migraine 04/14/2015   Sleep apnea 04/14/2015    Eloy End, PT, DPT 05/09/21 12:27 PM  [facility] [address] [place], [place], [address] Phone: [phone]   Fax:  [phone]  Name: AMELYA MABRY MRN: [id number] Date of Birth: 1981/04/12   PHYSICAL THERAPY DISCHARGE SUMMARY  Visits from Start of Care: 9  Current functional level related to goals / functional outcomes: Unable to assess   Remaining deficits: Unable to assess    Education / Equipment: Unable   Patient agrees to discharge. Patient goals were  Unable to assess . Patient is being discharged due to not returning since the last visit.

## 2021-05-11 ENCOUNTER — Encounter: Payer: Self-pay | Admitting: Pulmonary Disease

## 2021-05-11 ENCOUNTER — Ambulatory Visit
Admission: RE | Admit: 2021-05-11 | Discharge: 2021-05-11 | Disposition: A | Discharge status: Home / Self Care | Payer: Commercial Managed Care - PPO | Source: Ambulatory Visit | Attending: Pulmonary Disease | Admitting: Pulmonary Disease

## 2021-05-11 ENCOUNTER — Other Ambulatory Visit: Payer: Self-pay

## 2021-05-11 ENCOUNTER — Ambulatory Visit (INDEPENDENT_AMBULATORY_CARE_PROVIDER_SITE_OTHER): Payer: Commercial Managed Care - PPO | Admitting: Pulmonary Disease

## 2021-05-11 VITALS — BP 112/82 | HR 75 | Temp 98.0°F | Ht 68.0 in | Wt 230.0 lb

## 2021-05-11 DIAGNOSIS — R0683 Snoring: Secondary | ICD-10-CM

## 2021-05-11 DIAGNOSIS — Z8616 Personal history of COVID-19: Secondary | ICD-10-CM | POA: Diagnosis present

## 2021-05-11 DIAGNOSIS — R053 Chronic cough: Secondary | ICD-10-CM

## 2021-05-11 MED ORDER — ALBUTEROL SULFATE HFA 108 (90 BASE) MCG/ACT IN AERS: 2.0000 | INHALATION_SPRAY | Freq: Four times a day (QID) | RESPIRATORY_TRACT | 5 refills | Status: DC | PRN

## 2021-05-11 MED ORDER — MONTELUKAST SODIUM 10 MG PO TABS: 10.0000 mg | ORAL_TABLET | Freq: Every day | ORAL | 5 refills | Status: DC

## 2021-05-11 MED ORDER — FLUTICASONE PROPIONATE HFA 44 MCG/ACT IN AERO: 2.0000 | INHALATION_SPRAY | Freq: Two times a day (BID) | RESPIRATORY_TRACT | 12 refills | Status: DC

## 2021-05-11 NOTE — Progress Notes (Signed)
[facility]  Chief Complaint  Patient presents with   Cough     Constitutional:  BP 112/82 (BP Location: Left Arm, Patient Position: Sitting, Cuff Size: Normal)   Pulse 75   Temp 98 F (36.7 C) (Oral)   Ht 5\' 8"  (1.727 m)   Wt 230 lb (104.3 kg)   LMP 06/01/2015 (Within Days)   SpO2 97%   BMI 34.97 kg/m   Past Medical History:  Anemia, Bipolar, Conversion disorder, Insomnia, Migraine headaches, Lt ovarian cyst, OSA, Vit B12 deficiency, Vit D deficiency, COVID 19 infection December 2021.  Past Surgical History:  She  has a past surgical history that includes Bunionectomy; Hammer toe surgery; Nasal sinus surgery; Hand surgery (09/24/2012); Tubal ligation (09/24/2005); Laparoscopic tubal ligation (Bilateral, 05/09/2015); Intrauterine device (iud) insertion (N/A, 05/09/2015); Laparoscopic assisted vaginal hysterectomy (N/A, 06/20/2015); Abdominal hysterectomy; and Esophagogastroduodenoscopy (egd) with propofol (N/A, 11/03/2018).  Brief Summary:  Emily [name] is a 40 y.o. female former smoker with cough with history of asthma.  She had COVID 19 infection in December 2021.      Subjective:   She was treated with monoclonal antibody in December 2021 for COVID 19 infection.  She had antibody testing in August 2021 and was IgG antibody positive for COVID.   She has history of asthma and allergies.  She feels her asthma has gotten worse since having COVID.  She was using symbicort which helped, but made her feel funny and she was worried this was causing weight gain.  She uses albuterol several times per day and this helps for a while.  She gets cough with cream colored phlegm and intermittent episodes of wheezing.  She has trouble breathing through her nose.  She has multiple allergies, including to dogs.  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  No issues with aspirin.  Denies skin rashes.  No prior history of pneumonia or exposure to tuberculosis.   She is a [place] 2021" and has lived in several states until she finally settled in [place].  She quit smoking about 16 yrs ago.  Chest xray from 01/19/21 showed blunting of costophrenic sulci.    She had sleep study couple of years ago and was started on CPAP.  She feels this helps her sleep, but then she started getting frequent sinus infections from using CPAP and had to stop.  She snores and wakes up feeling short of breath.  She is a restless sleeper and gets cramps in her legs at night several times per week.  She feels tired during the day.  Physical Exam:   Appearance - well kempt   ENMT - no sinus tenderness, no oral exudate, no LAN, Mallampati 3 airway, no stridor, mild septal deviation  Respiratory - equal breath sounds bilaterally, no wheezing or rales  CV - s1s2 regular rate and rhythm, no murmurs  Ext - no clubbing, no edema  Skin - no rashes  Psych - normal mood and affect   Pulmonary testing:    Chest Imaging:    Sleep Tests:    Cardiac Tests:    Social History:  She  reports that she quit smoking about 17 years ago. Her smoking use included cigarettes. She has a 10.00 pack-year smoking history. She has never used smokeless tobacco. She reports current alcohol use. She reports that she does not use drugs.  Family History:  Her family history includes Anxiety disorder in her mother; Bipolar disorder in her mother and  sister; Breast cancer in her brother; Cancer in her father, maternal grandmother, and mother; Diabetes in her father; Glaucoma in her mother; Heart disease in her father and maternal grandfather; Schizophrenia in her sister.    Discussion:  She has history of allergies and asthma.  She had COVID 19 infection in December 2021, and her symptoms seem to have progressed since then.  She had symptomatic benefit from ICS/LABA therapy, but stopped to due to concern with weight gain (likely from higher dose ICS component) and "feeling funny" (likely  from LABA component).  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  She has prior history of smoking, but quit about 16 yrs ago.  She has history of sleep apnea and had symptomatic improvement from CPAP therapy, but had trouble with recurrent sinus infections and had to stop using CPAP.  She continues to have snoring, sleep disruption, apnea, and daytime sleepiness.  She has a history of mood disorders.  I am concerned she could still have obstructive sleep apnea.  Assessment/Plan:   Cough with hx of asthma, allergies, bird exposure, and COVID 19 infection. - will have her try low dose flovent 44 mg two puffs bid with spacer device - prn albuterol with spacer device - montelukast 10 mg qhs - will arrange for chest xray and pulmonary function test  Snoring with history of obstructive sleep apnea. - will arrange for home sleep study to assess current status  - could be a candidate for oral appliance or Inspire device if retrying CPAP isn't an option   Time Spent Involved in Patient Care on Day of Examination:  48 minutes  Follow up:   Patient Instructions  Flovent two puffs in the morning and two puffs in the evening, and rinse your mouth after each use. Albuterol two puffs every 6 hours as needed  for cough, wheeze, or chest congestion. Will arrange for spacer device to use with flovent and albuterol. Singulair 10 mg pill nightly. Will arrange for chest xray, pulmonary function test, and home sleep study. Follow up in 4 to 6 weeks with Dr. Craige Cotta or Nurse Practitioner.  Medication List:   Allergies as of 05/11/2021       Reactions   Codeine Diarrhea, Nausea And Vomiting        Medication List        Accurate as of May 11, 2021 10:41 AM. If you have any questions, ask your nurse or doctor.          STOP taking these medications    NEEDLE (DISP) 18 G 18G X 1" Misc Stopped by: Coralyn Helling, MD   Saxenda 18 MG/3ML Sopn Generic drug: Liraglutide -Weight Management Stopped  by: Coralyn Helling, MD   scopolamine 1 MG/3DAYS Commonly known as: TRANSDERM-SCOP Stopped by: Coralyn Helling, MD   St Johns Wort 300 MG Tabs Stopped by: Coralyn Helling, MD   Vitamin D (Cholecalciferol) 25 MCG (1000 UT) Caps Stopped by: Coralyn Helling, MD       TAKE these medications    albuterol 108 (90 Base) MCG/ACT inhaler Commonly known as: VENTOLIN HFA Inhale 2 puffs into the lungs every 6 (six) hours as needed for wheezing or shortness of breath.   B-D UF III MINI PEN NEEDLES 31G X 5 MM Misc Generic drug: Insulin Pen Needle 1 Units by Does not apply route daily.   buPROPion 300 MG 24 hr tablet Commonly known as: Wellbutrin XL Take 1 tablet (300 mg total) by mouth daily.   cyanocobalamin  1000 MCG/ML injection Commonly known as: (VITAMIN B-12) Inject 1 mL (1,000 mcg total) into the muscle every 14 (fourteen) days.   fluticasone 44 MCG/ACT inhaler Commonly known as: Flovent HFA Inhale 2 puffs into the lungs 2 (two) times daily. Started by: Coralyn Helling, MD   fluticasone 50 MCG/ACT nasal spray Commonly known as: FLONASE Place 2 sprays into both nostrils daily as needed for allergies or rhinitis.   ibuprofen 800 MG tablet Commonly known as: ADVIL Take 1 tablet (800 mg total) by mouth every 8 (eight) hours as needed.   montelukast 10 MG tablet Commonly known as: SINGULAIR Take 1 tablet (10 mg total) by mouth at bedtime. Started by: Coralyn Helling, MD        Signature:  Coralyn Helling, MD [facility] Pager - [phone] 05/11/2021, 10:41 AM

## 2021-05-11 NOTE — Patient Instructions (Signed)
Flovent two puffs in the morning and two puffs in the evening, and rinse your mouth after each use. Albuterol two puffs every 6 hours as needed  for cough, wheeze, or chest congestion. Will arrange for spacer device to use with flovent and albuterol. Singulair 10 mg pill nightly. Will arrange for chest xray, pulmonary function test, and home sleep study. Follow up in 4 to 6 weeks with Dr. Craige Cotta or Nurse Practitioner.

## 2021-05-12 ENCOUNTER — Encounter: Payer: Self-pay | Admitting: *Deleted

## 2021-05-12 ENCOUNTER — Telehealth: Payer: Self-pay

## 2021-05-12 NOTE — Telephone Encounter (Signed)
Pt is aware of upcoming covid test, nothing further needed.

## 2021-05-15 ENCOUNTER — Other Ambulatory Visit: Payer: Self-pay

## 2021-05-15 ENCOUNTER — Other Ambulatory Visit
Admission: RE | Admit: 2021-05-15 | Discharge: 2021-05-15 | Disposition: A | Discharge status: Home / Self Care | Payer: Commercial Managed Care - PPO | Source: Ambulatory Visit | Attending: Pulmonary Disease | Admitting: Pulmonary Disease

## 2021-05-15 DIAGNOSIS — Z01812 Encounter for preprocedural laboratory examination: Secondary | ICD-10-CM | POA: Insufficient documentation

## 2021-05-15 DIAGNOSIS — Z20822 Contact with and (suspected) exposure to covid-19: Secondary | ICD-10-CM | POA: Insufficient documentation

## 2021-05-15 LAB — SARS CORONAVIRUS 2 (TAT 6-24 HRS): SARS Coronavirus 2: NEGATIVE

## 2021-05-16 ENCOUNTER — Ambulatory Visit: Payer: Commercial Managed Care - PPO | Attending: Pulmonary Disease

## 2021-05-16 DIAGNOSIS — R053 Chronic cough: Secondary | ICD-10-CM | POA: Diagnosis present

## 2021-05-16 DIAGNOSIS — Z8616 Personal history of COVID-19: Secondary | ICD-10-CM

## 2021-05-16 DIAGNOSIS — Z87891 Personal history of nicotine dependence: Secondary | ICD-10-CM | POA: Insufficient documentation

## 2021-05-16 LAB — PULMONARY FUNCTION TEST ARMC ONLY
DL/VA % pred: 100 %
DL/VA: 4.33 ml/min/mmHg/L
DLCO unc % pred: 69 %
DLCO unc: 17.37 ml/min/mmHg
FEF 25-75 Post: 2.38 L/sec
FEF 25-75 Pre: 3.48 L/sec
FEF2575-%Change-Post: -31 %
FEF2575-%Pred-Post: 70 %
FEF2575-%Pred-Pre: 103 %
FEV1-%Change-Post: -10 %
FEV1-%Pred-Post: 78 %
FEV1-%Pred-Pre: 88 %
FEV1-Post: 2.68 L
FEV1-Pre: 3.01 L
FEV1FVC-%Change-Post: 0 %
FEV1FVC-%Pred-Pre: 102 %
FEV6-%Change-Post: -11 %
FEV6-%Pred-Post: 77 %
FEV6-%Pred-Pre: 86 %
FEV6-Post: 3.19 L
FEV6-Pre: 3.58 L
FEV6FVC-%Pred-Post: 101 %
FEV6FVC-%Pred-Pre: 101 %
FVC-%Change-Post: -11 %
FVC-%Pred-Post: 75 %
FVC-%Pred-Pre: 85 %
FVC-Post: 3.19 L
FVC-Pre: 3.58 L
Post FEV1/FVC ratio: 84 %
Post FEV6/FVC ratio: 100 %
Pre FEV1/FVC ratio: 84 %
Pre FEV6/FVC Ratio: 100 %
RV % pred: 86 %
RV: 1.55 L
TLC % pred: 78 %
TLC: 4.46 L

## 2021-05-16 MED ORDER — ALBUTEROL SULFATE (2.5 MG/3ML) 0.083% IN NEBU
2.5000 mg | INHALATION_SOLUTION | Freq: Once | RESPIRATORY_TRACT | Status: AC
  Administered 2021-05-16: 2.5 mg via RESPIRATORY_TRACT
  Filled 2021-05-16: qty 3

## 2021-05-18 ENCOUNTER — Other Ambulatory Visit: Payer: Self-pay

## 2021-05-18 DIAGNOSIS — J849 Interstitial pulmonary disease, unspecified: Secondary | ICD-10-CM

## 2021-05-23 ENCOUNTER — Ambulatory Visit: Payer: Commercial Managed Care - PPO

## 2021-06-01 ENCOUNTER — Ambulatory Visit: Payer: Commercial Managed Care - PPO

## 2021-06-04 ENCOUNTER — Telehealth: Payer: Commercial Managed Care - PPO | Admitting: Nurse Practitioner

## 2021-06-04 DIAGNOSIS — U071 COVID-19: Secondary | ICD-10-CM | POA: Diagnosis not present

## 2021-06-04 MED ORDER — PSEUDOEPH-BROMPHEN-DM 30-2-10 MG/5ML PO SYRP: 5.0000 mL | ORAL_SOLUTION | Freq: Four times a day (QID) | ORAL | 0 refills | Status: AC | PRN

## 2021-06-04 MED ORDER — NIRMATRELVIR/RITONAVIR (PAXLOVID)TABLET: 3.0000 | ORAL_TABLET | Freq: Two times a day (BID) | ORAL | 0 refills | Status: AC

## 2021-06-04 NOTE — Progress Notes (Signed)
Virtual Visit Consent   Emily [name], you are scheduled for a virtual visit with a [facility] provider today.     Just as with appointments in the office, your consent must be obtained to participate.  Your consent will be active for this visit and any virtual visit you may have with one of our providers in the next 365 days.     If you have a MyChart account, a copy of this consent can be sent to you electronically.  All virtual visits are billed to your insurance company just like a traditional visit in the office.    As this is a virtual visit, video technology does not allow for your provider to perform a traditional examination.  This may limit your provider's ability to fully assess your condition.  If your provider identifies any concerns that need to be evaluated in person or the need to arrange testing (such as labs, EKG, etc.), we will make arrangements to do so.     Although advances in technology are sophisticated, we cannot ensure that it will always work on either your end or our end.  If the connection with a video visit is poor, the visit may have to be switched to a telephone visit.  With either a video or telephone visit, we are not always able to ensure that we have a secure connection.     I need to obtain your verbal consent now.   Are you willing to proceed with your visit today? Yes   Emily [name] has provided verbal consent on 06/04/2021 for a virtual visit (video or telephone).   Nicoletta Ba, NP   Date: 06/04/2021 12:46 PM   Virtual Visit via Video Note   I, Nicoletta Ba, connected with  Emily [name]  ([id number], 1980/12/10) on 06/04/21 at 12:45 PM EDT by a video-enabled telemedicine application and verified that I am speaking with the correct person using two identifiers.  Location: Patient: Virtual Visit Location Patient: Home Provider: Virtual Visit Location Provider: Home Office   I discussed the limitations of evaluation and management by  telemedicine and the availability of in person appointments. The patient expressed understanding and agreed to proceed.    History of Present Illness: Emily [name] is a 40 y.o. who identifies as a female who was assigned female at birth, and is being seen today for a positive COVID test. Had COVID in December, is seeing the long-haul clinic for scarring from previous COVID diagnosis. Complains of bodyaches, sore throat, productive cough, SOB and wheezing, diarrhea x once on day ago.  Denies fever, chills, HA, nausea, vomiting. She admits to increased inhaler use over the past week. She is scheduled for Chest CT this week.  HPI: HPI  Problems:  Patient Active Problem List   Diagnosis Date Noted   History of COVID-19 01/19/2021   Cough 01/19/2021   Dyspnea on exertion 01/19/2021   Olfactory impairment 01/19/2021   Irritable bowel syndrome with diarrhea 10/27/2018   Surgical menopause, symptomatic 02/12/2018   Obesity 07/03/2016   Status post laparoscopic hysterectomy 06/20/2015   Insomnia 04/14/2015   Asthma 04/14/2015   Bipolar disorder (HCC) 04/14/2015   Vitamin B12 deficiency 04/14/2015   Vitamin D deficiency 04/14/2015   Migraine 04/14/2015   Sleep apnea 04/14/2015    Allergies:  Allergies  Allergen Reactions   Codeine Diarrhea and Nausea And Vomiting   Medications:  Current Outpatient Medications:    albuterol (VENTOLIN HFA) 108 (90 Base) MCG/ACT inhaler,  Inhale 2 puffs into the lungs every 6 (six) hours as needed for wheezing or shortness of breath., Disp: 8 g, Rfl: 5   B-D UF III MINI PEN NEEDLES 31G X 5 MM MISC, 1 Units by Does not apply route daily., Disp: 100 each, Rfl: 0   buPROPion (WELLBUTRIN XL) 300 MG 24 hr tablet, Take 1 tablet (300 mg total) by mouth daily., Disp: 90 tablet, Rfl: 1   cyanocobalamin (,VITAMIN B-12,) 1000 MCG/ML injection, Inject 1 mL (1,000 mcg total) into the muscle every 14 (fourteen) days., Disp: 2 mL, Rfl: 3   fluticasone (FLONASE) 50 MCG/ACT  nasal spray, Place 2 sprays into both nostrils daily as needed for allergies or rhinitis., Disp: , Rfl:    fluticasone (FLOVENT HFA) 44 MCG/ACT inhaler, Inhale 2 puffs into the lungs 2 (two) times daily., Disp: 1 each, Rfl: 12   ibuprofen (ADVIL) 800 MG tablet, Take 1 tablet (800 mg total) by mouth every 8 (eight) hours as needed., Disp: 30 tablet, Rfl: 0   montelukast (SINGULAIR) 10 MG tablet, Take 1 tablet (10 mg total) by mouth at bedtime., Disp: 30 tablet, Rfl: 5  Observations/Objective: Patient is well-developed, well-nourished in no acute distress.  Resting comfortably at home.  Head is normocephalic, atraumatic.  No labored breathing.  Speech is clear and coherent with logical content.  Patient is alert and oriented at baseline.    Assessment and Plan: 1. COVID-19 - nirmatrelvir/ritonavir EUA (PAXLOVID) 20 x 150 MG & 10 x 100MG  TABS; Take 3 tablets by mouth 2 (two) times daily for 5 days. (Take nirmatrelvir 150 mg two tablets twice daily for 5 days and ritonavir 100 mg one tablet twice daily for 5 days) Patient GFR is 1.2  Dispense: 30 tablet; Refill: 0 - brompheniramine-pseudoephedrine-DM 30-2-10 MG/5ML syrup; Take 5 mLs by mouth 4 (four) times daily as needed for up to 7 days.  Dispense: 140 mL; Refill: 0  Will start antiviral at this time. Recent labs are available. Patient is scheduled to have a Chest CT this week and then follow-up with pulmonologist. Discussed current COVID effects on lung condition from previous COVID diagnosis. Encouraged use of albuterol inhaler, cough medicine until follow up with pulmonologist. Increase fluids, get plenty of rest. Go to the ER if difficulty breathing, SOB or other concerns. Patient verbalizes understanding, all questions answered.    Follow Up Instructions: I discussed the assessment and treatment plan with the patient. The patient was provided an opportunity to ask questions and all were answered. The patient agreed with the plan and  demonstrated an understanding of the instructions.  A copy of instructions were sent to the patient via MyChart.  The patient was advised to call back or seek an in-person evaluation if the symptoms worsen or if the condition fails to improve as anticipated.  Time:  I spent 20 minutes with the patient via telehealth technology discussing the above problems/concerns.    09-20-1974, NP

## 2021-06-04 NOTE — Patient Instructions (Signed)
Emily [name], thank you for joining Emily Ba, NP for today's virtual visit.  While this provider is not your primary care provider (PCP), if your PCP is located in our provider database this encounter information will be shared with them immediately following your visit.  Consent: [name] provided verbal consent for this virtual visit at the beginning of the encounter.  Current Medications:  Current Outpatient Medications:    albuterol (VENTOLIN HFA) 108 (90 Base) MCG/ACT inhaler, Inhale 2 puffs into the lungs every 6 (six) hours as needed for wheezing or shortness of breath., Disp: 8 g, Rfl: 5   B-D UF III MINI PEN NEEDLES 31G X 5 MM MISC, 1 Units by Does not apply route daily., Disp: 100 each, Rfl: 0   brompheniramine-pseudoephedrine-DM 30-2-10 MG/5ML syrup, Take 5 mLs by mouth 4 (four) times daily as needed for up to 7 days., Disp: 140 mL, Rfl: 0   buPROPion (WELLBUTRIN XL) 300 MG 24 hr tablet, Take 1 tablet (300 mg total) by mouth daily., Disp: 90 tablet, Rfl: 1   cyanocobalamin (,VITAMIN B-12,) 1000 MCG/ML injection, Inject 1 mL (1,000 mcg total) into the muscle every 14 (fourteen) days., Disp: 2 mL, Rfl: 3   fluticasone (FLONASE) 50 MCG/ACT nasal spray, Place 2 sprays into both nostrils daily as needed for allergies or rhinitis., Disp: , Rfl:    ibuprofen (ADVIL) 800 MG tablet, Take 1 tablet (800 mg total) by mouth every 8 (eight) hours as needed., Disp: 30 tablet, Rfl: 0   nirmatrelvir/ritonavir EUA (PAXLOVID) 20 x 150 MG & 10 x 100MG  TABS, Take 3 tablets by mouth 2 (two) times daily for 5 days. (Take nirmatrelvir 150 mg two tablets twice daily for 5 days and ritonavir 100 mg one tablet twice daily for 5 days) Patient GFR is 1.2, Disp: 30 tablet, Rfl: 0   fluticasone (FLOVENT HFA) 44 MCG/ACT inhaler, Inhale 2 puffs into the lungs 2 (two) times daily. (Patient not taking: Reported on 06/04/2021), Disp: 1 each, Rfl: 12   montelukast (SINGULAIR) 10 MG tablet, Take 1  tablet (10 mg total) by mouth at bedtime. (Patient not taking: Reported on 06/04/2021), Disp: 30 tablet, Rfl: 5   Medications ordered in this encounter:  Meds ordered this encounter  Medications   nirmatrelvir/ritonavir EUA (PAXLOVID) 20 x 150 MG & 10 x 100MG  TABS    Sig: Take 3 tablets by mouth 2 (two) times daily for 5 days. (Take nirmatrelvir 150 mg two tablets twice daily for 5 days and ritonavir 100 mg one tablet twice daily for 5 days) Patient GFR is 1.2    Dispense:  30 tablet    Refill:  0   brompheniramine-pseudoephedrine-DM 30-2-10 MG/5ML syrup    Sig: Take 5 mLs by mouth 4 (four) times daily as needed for up to 7 days.    Dispense:  140 mL    Refill:  0     *If you need refills on other medications prior to your next appointment, please contact your pharmacy*  Follow-Up: Call back or seek an in-person evaluation if the symptoms worsen or if the condition fails to improve as anticipated.  Other Instructions Will start antiviral at this time. Recent labs are available. Follow up for Chest CT this week and then follow-up with pulmonologist. Discussed current COVID effects on lung condition from previous COVID diagnosis. Encouraged use of albuterol inhaler, cough medicine until follow up with pulmonologist. Increase fluids, get plenty of rest. Go to the ER if difficulty breathing,  SOB or other concerns.      If you have been instructed to have an in-person evaluation today at a local Urgent Care facility, please use the link below. It will take you to a list of all of our available [facility], including address, phone number and hours of operation. Please do not delay care.  [facility]  If you or a family member do not have a primary care provider, use the link below to schedule a visit and establish care. When you choose a [facility] primary care physician or advanced practice provider, you gain a long-term partner in health. Find a Primary Care  Provider  Learn more about [facility] in-office and virtual care options:  - Get Care Now

## 2021-06-07 ENCOUNTER — Ambulatory Visit: Payer: Commercial Managed Care - PPO

## 2021-06-09 MED ORDER — PREDNISONE 10 MG PO TABS: ORAL_TABLET | ORAL | 0 refills | Status: AC

## 2021-06-09 NOTE — Telephone Encounter (Signed)
Spoke to patient via telephone.  Patient stated that she tested positive for covid 06/04/2021 with home test.  C/o body aches, dry cough at times prod with clear to yellow sputum, diarrhea and wheezing and sob after coughing spell. Sx started on 06/01/2021. Denied f/c/s or additional sx.  She does not wear supplemental oxygen. Spo2 is maintaining around 92% at rest.  Virtual visit with PCP on 06/04/2021 and started on Paxloxid and brompheniramine- pseudoephedrine. She has has very mild improvement with treatment. Cough is unchanged.    She is using albuterol HFA TID. She has not used flovent in over a month due to it causing a sore throat.   Dr. Craige Cotta, please advise. Thanks

## 2021-06-09 NOTE — Telephone Encounter (Signed)
Spoke with pt and reviewed Dr. Evlyn Courier recommendations. Pt stated understanding and verified pharmacy. Prednisone order placed. Nothing further needed at this time.

## 2021-06-09 NOTE — Telephone Encounter (Signed)
Can send script for prednisone 10 mg pill >> 3 pills daily for 2 days, 2 pills daily for 2 days, 1 pill daily for 2 days. 

## 2021-06-15 NOTE — Progress Notes (Signed)
BP 133/81   Pulse 75   Temp 98.6 F (37 C) (Oral)   Wt 239 lb 6.4 oz (108.6 kg)   LMP 06/01/2015 (Within Days)   SpO2 99%   BMI 36.40 kg/m    Subjective:    Patient ID: Emily [name], female    DOB: 09-Jul-1981, 40 y.o.   MRN: [id number]  HPI: Emily [name] is a 40 y.o. female  Chief Complaint  Patient presents with   Weight Check    Patient states she is back to gaining weight. Patient states she is no longer on the medication for weight loss.    WEIGHT MANAGEMENT Patient presents to clinic to follow up on weight loss.  Patient states the Bernie Covey is no longer working for her.  She stopped taking it August 31.  Patient denies other concerns at visit besides that she is no longer losing weight.      Relevant past medical, surgical, family and social history reviewed and updated as indicated. Interim medical history since our last visit reviewed. Allergies and medications reviewed and updated.  Review of Systems  Constitutional:  Positive for unexpected weight change.   Per HPI unless specifically indicated above     Objective:    BP 133/81   Pulse 75   Temp 98.6 F (37 C) (Oral)   Wt 239 lb 6.4 oz (108.6 kg)   LMP 06/01/2015 (Within Days)   SpO2 99%   BMI 36.40 kg/m   Wt Readings from Last 3 Encounters:  06/16/21 239 lb 6.4 oz (108.6 kg)  05/11/21 230 lb (104.3 kg)  03/17/21 230 lb 4 oz (104.4 kg)    Physical Exam Vitals and nursing note reviewed.  Constitutional:      General: She is not in acute distress.    Appearance: Normal appearance. She is normal weight. She is not ill-appearing, toxic-appearing or diaphoretic.  HENT:     Head: Normocephalic.     Right Ear: External ear normal.     Left Ear: External ear normal.     Nose: Nose normal.     Mouth/Throat:     Mouth: Mucous membranes are moist.     Pharynx: Oropharynx is clear.  Eyes:     General:        Right eye: No discharge.        Left eye: No discharge.     Extraocular Movements:  Extraocular movements intact.     Conjunctiva/sclera: Conjunctivae normal.     Pupils: Pupils are equal, round, and reactive to light.  Cardiovascular:     Rate and Rhythm: Normal rate and regular rhythm.     Heart sounds: No murmur heard. Pulmonary:     Effort: Pulmonary effort is normal. No respiratory distress.     Breath sounds: Normal breath sounds. No wheezing or rales.  Musculoskeletal:     Cervical back: Normal range of motion and neck supple.  Skin:    General: Skin is warm and dry.     Capillary Refill: Capillary refill takes less than 2 seconds.  Neurological:     General: No focal deficit present.     Mental Status: She is alert and oriented to person, place, and time. Mental status is at baseline.  Psychiatric:        Mood and Affect: Mood normal.        Behavior: Behavior normal.        Thought Content: Thought content normal.  Judgment: Judgment normal.    Results for orders placed or performed in visit on 05/16/21  Pulmonary Function Test [facility] Only  Result Value Ref Range   FVC-Pre 3.58 L   FVC-%Pred-Pre 85 %   FVC-Post 3.19 L   FVC-%Pred-Post 75 %   FVC-%Change-Post -11 %   FEV1-Pre 3.01 L   FEV1-%Pred-Pre 88 %   FEV1-Post 2.68 L   FEV1-%Pred-Post 78 %   FEV1-%Change-Post -10 %   FEV6-Pre 3.58 L   FEV6-%Pred-Pre 86 %   FEV6-Post 3.19 L   FEV6-%Pred-Post 77 %   FEV6-%Change-Post -11 %   Pre FEV1/FVC ratio 84 %   FEV1FVC-%Pred-Pre 102 %   Post FEV1/FVC ratio 84 %   FEV1FVC-%Change-Post 0 %   Pre FEV6/FVC Ratio 100 %   FEV6FVC-%Pred-Pre 101 %   Post FEV6/FVC ratio 100 %   FEV6FVC-%Pred-Post 101 %   FEF 25-75 Pre 3.48 L/sec   FEF2575-%Pred-Pre 103 %   FEF 25-75 Post 2.38 L/sec   FEF2575-%Pred-Post 70 %   FEF2575-%Change-Post -31 %   RV 1.55 L   RV % pred 86 %   TLC 4.46 L   TLC % pred 78 %   DLCO unc 17.37 ml/min/mmHg   DLCO unc % pred 69 %   DL/VA 2.20 ml/min/mmHg/L   DL/VA % pred 254 %      Assessment & Plan:   Problem List Items  Addressed This Visit       Other   Obesity - Primary    Will refer patient to Medical Weight management for assistance with further weight loss. Patient understands and agrees with the plan of care.       Other Visit Diagnoses     Encounter for weight management            Follow up plan: Return in about 6 months (around 12/14/2021) for Weight loss and Lung check .

## 2021-06-16 ENCOUNTER — Encounter: Payer: Self-pay | Admitting: Nurse Practitioner

## 2021-06-16 ENCOUNTER — Other Ambulatory Visit: Payer: Self-pay

## 2021-06-16 ENCOUNTER — Encounter: Payer: Self-pay | Admitting: Primary Care

## 2021-06-16 ENCOUNTER — Ambulatory Visit (INDEPENDENT_AMBULATORY_CARE_PROVIDER_SITE_OTHER): Payer: Commercial Managed Care - PPO | Admitting: Primary Care

## 2021-06-16 ENCOUNTER — Ambulatory Visit (INDEPENDENT_AMBULATORY_CARE_PROVIDER_SITE_OTHER): Payer: Commercial Managed Care - PPO | Admitting: Nurse Practitioner

## 2021-06-16 VITALS — BP 133/81 | HR 75 | Temp 98.6°F | Wt 239.4 lb

## 2021-06-16 DIAGNOSIS — E6609 Other obesity due to excess calories: Secondary | ICD-10-CM | POA: Diagnosis not present

## 2021-06-16 DIAGNOSIS — R053 Chronic cough: Secondary | ICD-10-CM

## 2021-06-16 DIAGNOSIS — Z6839 Body mass index (BMI) 39.0-39.9, adult: Secondary | ICD-10-CM

## 2021-06-16 DIAGNOSIS — Z7689 Persons encountering health services in other specified circumstances: Secondary | ICD-10-CM | POA: Diagnosis not present

## 2021-06-16 DIAGNOSIS — G4730 Sleep apnea, unspecified: Secondary | ICD-10-CM | POA: Diagnosis not present

## 2021-06-16 MED ORDER — BUDESONIDE-FORMOTEROL FUMARATE 80-4.5 MCG/ACT IN AERO: 2.0000 | INHALATION_SPRAY | Freq: Two times a day (BID) | RESPIRATORY_TRACT | 3 refills | Status: DC

## 2021-06-16 MED ORDER — BENZONATATE 200 MG PO CAPS: 200.0000 mg | ORAL_CAPSULE | Freq: Three times a day (TID) | ORAL | 1 refills | Status: DC | PRN

## 2021-06-16 NOTE — Assessment & Plan Note (Signed)
-   Patient develop chronic cough after having covid-19 in December 2021. She tested positive again for Covid-19 on 06/04/21. Completed paxlovid and prednisone course. She continues to have a reactive cough and dyspnea symptoms. Did not tolerate Flovent hfa d.t throat irritation. No improvement with oral prednisone. Albuterol does help. She has been on Symbicort in the past and thinks she tolerated this. We will send in RX Symbicort two puff twice daily and Tessalon Perles 200mg  TID for cough. She is scheduled for HRCT on 06/19/21 and we will follow-up after with results.

## 2021-06-16 NOTE — Patient Instructions (Signed)
Recommendations: Start Symbicort 80 - take 2 puffs morning and evening use with spacer (rinse mouth after use) Take Tessalon Perles every 8 hours as needed for cough Use Flonase every other day as needed for PND and cough We will follow-up after CT chest and sleep study to go over results  Follow-up: 3 months with Dr. Craige Cotta

## 2021-06-16 NOTE — Assessment & Plan Note (Signed)
Will refer patient to Medical Weight management for assistance with further weight loss. Patient understands and agrees with the plan of care.

## 2021-06-16 NOTE — Progress Notes (Signed)
Reviewed and agree with assessment/plan.   Coralyn Helling, MD [facility] Pulmonary/Critical Care 06/16/2021, 2:49 PM Pager:  [phone]

## 2021-06-16 NOTE — Assessment & Plan Note (Signed)
-   Awaiting home sleep test to be scheduled

## 2021-06-16 NOTE — Progress Notes (Signed)
@Patient  ID: , female    DOB: March 09, 1981, 40 y.o.   MRN: [id number]  No chief complaint on file.   Referring provider: [id number], NP  HPI: 40 year old female, quit in 2005 (10-pack-year history).  Past medical history significant for sleep apnea, asthma, interstitial lung disease, history of COVID-19, bipolar disorder.  Patient of Dr. 2006, seen for initial consult on 05/11/2021 for chronic cough. Ordered for PFTs and home sleep study.   Previous [facility] pulmonary encounter: 05/11/21 - Consult, Dr. 05/13/21  She was treated with monoclonal antibody in December 2021 for COVID 19 infection.  She had antibody testing in August 2021 and was IgG antibody positive for COVID.   She has history of asthma and allergies.  She feels her asthma has gotten worse since having COVID.  She was using symbicort which helped, but made her feel funny and she was worried this was causing weight gain.  She uses albuterol several times per day and this helps for a while.  She gets cough with cream colored phlegm and intermittent episodes of wheezing.  She has trouble breathing through her nose.  She has multiple allergies, including to dogs.  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  No issues with aspirin.  Denies skin rashes.  No prior history of pneumonia or exposure to tuberculosis.  She is a September 2021" and has lived in several states until she finally settled in [place].  She quit smoking about 16 yrs ago.  Chest xray from 01/19/21 showed blunting of costophrenic sulci.    She had sleep study couple of years ago and was started on CPAP.  She feels this helps her sleep, but then she started getting frequent sinus infections from using CPAP and had to stop.  She snores and wakes up feeling short of breath.  She is a restless sleeper and gets cramps in her legs at night several times per week.  She feels tired during the day.  Cough with hx of asthma, allergies, bird exposure, and COVID 19  infection. - will have her try low dose flovent 44 mg two puffs bid with spacer device - prn albuterol with spacer device - montelukast 10 mg qhs - will arrange for chest xray and pulmonary function test   Snoring with history of obstructive sleep apnea. - will arrange for home sleep study to assess current status  - could be a candidate for oral appliance or Inspire device if retrying CPAP isn't an option   06/16/2021- interim hx  Patient presents today for 1 month follow-up chronic cough hx prior covid 19. CX in August showed no active cardiopulmonary disease, both lungs were clear. She recently tested positive for covid on September 11th, symptoms started on 06/01/21. She was treated with paxlovid and prednisone taper. CT chest moved to September 26th.   Since second round of covid her breathing is worse. She has coughing fits. Delsym stopped working. She stopped taking Flovent d/t throat irritation and stopped singular d/t increased appetite. She is using Flonase as needed. She reports oral antihistamines cause weight gain. Sleep study has not been scheduled.   Pulmonary function testing: 05/16/21 FVC 3.19 (75%), FEV1 2.68 (78%), ratio 84, TLC 78%, DLCOunc 17.37 (69%) Normal spirometry. Mild restriction and diffusion defect    Allergies  Allergen Reactions   Codeine Diarrhea and Nausea And Vomiting    Immunization History  Administered Date(s) Administered   Influenza,inj,Quad PF,6+ Mos 06/30/2015, 07/03/2016, 10/01/2017, 07/31/2018, 08/04/2019   PFIZER(Purple Top)SARS-COV-2  Vaccination 12/05/2019, 01/03/2020, 12/13/2020   Td 10/02/2006   Tdap 10/01/2017    Past Medical History:  Diagnosis Date   Anemia    Asthma    sports induced inhalers   Bipolar 1 disorder (HCC)    Conversion disorder    Insomnia    Migraines    migraines   MRSA infection 2010   Ovarian cyst    left 2.5cm   Sleep apnea    does not use a CPAP   Vitamin B 12 deficiency    Vitamin D deficiency      Tobacco History: Social History   Tobacco Use  Smoking Status Former   Packs/day: 1.00   Years: 10.00   Pack years: 10.00   Types: Cigarettes   Quit date: 09/25/2003   Years since quitting: 17.7  Smokeless Tobacco Never   Counseling given: Not Answered   Outpatient Medications Prior to Visit  Medication Sig Dispense Refill   albuterol (VENTOLIN HFA) 108 (90 Base) MCG/ACT inhaler Inhale 2 puffs into the lungs every 6 (six) hours as needed for wheezing or shortness of breath. 8 g 5   B-D UF III MINI PEN NEEDLES 31G X 5 MM MISC 1 Units by Does not apply route daily. 100 each 0   buPROPion (WELLBUTRIN XL) 300 MG 24 hr tablet Take 1 tablet (300 mg total) by mouth daily. 90 tablet 1   cyanocobalamin (,VITAMIN B-12,) 1000 MCG/ML injection Inject 1 mL (1,000 mcg total) into the muscle every 14 (fourteen) days. 2 mL 3   fluticasone (FLONASE) 50 MCG/ACT nasal spray Place 2 sprays into both nostrils daily as needed for allergies or rhinitis.     ibuprofen (ADVIL) 800 MG tablet Take 1 tablet (800 mg total) by mouth every 8 (eight) hours as needed. 30 tablet 0   montelukast (SINGULAIR) 10 MG tablet Take 1 tablet (10 mg total) by mouth at bedtime. 30 tablet 5   fluticasone (FLOVENT HFA) 44 MCG/ACT inhaler Inhale 2 puffs into the lungs 2 (two) times daily. 1 each 12   No facility-administered medications prior to visit.    Review of Systems  Review of Systems  Constitutional: Negative.   HENT: Negative.    Respiratory:  Positive for cough.   Cardiovascular: Negative.     Physical Exam  BP 122/82 (BP Location: Left Arm, Patient Position: Sitting, Cuff Size: Normal)   Pulse 78   Temp (!) 97.3 F (36.3 C) (Temporal)   Ht 5\' 8"  (1.727 m)   Wt 239 lb (108.4 kg)   LMP 06/01/2015 (Within Days)   SpO2 98%   BMI 36.34 kg/m  Physical Exam Constitutional:      Appearance: Normal appearance.  HENT:     Head: Normocephalic and atraumatic.     Right Ear: Tympanic membrane and external  ear normal.     Left Ear: Tympanic membrane and external ear normal.     Mouth/Throat:     Comments: Deferred d/t masking Cardiovascular:     Rate and Rhythm: Normal rate and regular rhythm.  Pulmonary:     Effort: Pulmonary effort is normal.     Breath sounds: Normal breath sounds. No wheezing, rhonchi or rales.     Comments: CTA, poor effort. Reactive cough  Musculoskeletal:        General: Normal range of motion.  Skin:    General: Skin is warm and dry.  Neurological:     General: No focal deficit present.     Mental Status: She  is alert and oriented to person, place, and time. Mental status is at baseline.  Psychiatric:        Mood and Affect: Mood normal.        Behavior: Behavior normal.        Thought Content: Thought content normal.        Judgment: Judgment normal.     Lab Results:  CBC    Component Value Date/Time   WBC 7.3 12/19/2020 0835   WBC 7.4 11/27/2019 1050   RBC 4.85 12/19/2020 0835   RBC 4.94 11/27/2019 1050   HGB 14.6 12/19/2020 0835   HCT 44.1 12/19/2020 0835   PLT 253 12/19/2020 0835   MCV 91 12/19/2020 0835   MCH 30.1 12/19/2020 0835   MCH 30.6 11/27/2019 1050   MCHC 33.1 12/19/2020 0835   MCHC 33.6 11/27/2019 1050   RDW 12.3 12/19/2020 0835   LYMPHSABS 1.4 12/19/2020 0835   MONOABS 0.6 11/27/2019 1050   EOSABS 0.0 12/19/2020 0835   BASOSABS 0.0 12/19/2020 0835    BMET    Component Value Date/Time   NA 142 12/19/2020 0835   K 5.1 12/19/2020 0835   CL 104 12/19/2020 0835   CO2 23 12/19/2020 0835   GLUCOSE 80 12/19/2020 0835   GLUCOSE 87 11/27/2019 1050   BUN 10 12/19/2020 0835   CREATININE 1.21 (H) 12/19/2020 0835   CALCIUM 9.6 12/19/2020 0835   GFRNONAA >60 11/27/2019 1050   GFRAA >60 11/27/2019 1050    BNP No results found for: BNP  ProBNP No results found for: PROBNP  Imaging: No results found.   Assessment & Plan:   Chronic cough - Patient develop chronic cough after having covid-19 in December 2021. She tested  positive again for Covid-19 on 06/04/21. Completed paxlovid and prednisone course. She continues to have a reactive cough and dyspnea symptoms. Did not tolerate Flovent hfa d.t throat irritation. No improvement with oral prednisone. Albuterol does help. She has been on Symbicort in the past and thinks she tolerated this. We will send in RX Symbicort two puff twice daily and Tessalon Perles 200mg  TID for cough. She is scheduled for HRCT on 06/19/21 and we will follow-up after with results.   Sleep apnea - Awaiting home sleep test to be scheduled    06/21/21, NP 06/16/2021

## 2021-06-19 ENCOUNTER — Other Ambulatory Visit: Payer: Self-pay

## 2021-06-19 ENCOUNTER — Ambulatory Visit
Admission: RE | Admit: 2021-06-19 | Discharge: 2021-06-19 | Disposition: A | Discharge status: Home / Self Care | Payer: Commercial Managed Care - PPO | Source: Ambulatory Visit | Attending: Pulmonary Disease | Admitting: Pulmonary Disease

## 2021-06-19 DIAGNOSIS — J849 Interstitial pulmonary disease, unspecified: Secondary | ICD-10-CM | POA: Diagnosis present

## 2021-06-28 ENCOUNTER — Ambulatory Visit: Payer: Commercial Managed Care - PPO

## 2021-06-28 ENCOUNTER — Other Ambulatory Visit: Payer: Self-pay

## 2021-06-28 DIAGNOSIS — G4733 Obstructive sleep apnea (adult) (pediatric): Secondary | ICD-10-CM

## 2021-06-28 DIAGNOSIS — R0683 Snoring: Secondary | ICD-10-CM

## 2021-06-29 ENCOUNTER — Telehealth: Payer: Self-pay | Admitting: Pulmonary Disease

## 2021-06-29 DIAGNOSIS — G4733 Obstructive sleep apnea (adult) (pediatric): Secondary | ICD-10-CM

## 2021-06-29 NOTE — Telephone Encounter (Signed)
HST 06/28/21 >> AHI 29.5, SpO2 low 85%   Please inform her that her sleep study shows moderate to severe obstructive sleep apnea.  Please arrange for ROV with me or NP to discuss treatment options.

## 2021-06-30 ENCOUNTER — Other Ambulatory Visit: Payer: Self-pay

## 2021-06-30 MED ORDER — SPIRIVA RESPIMAT 2.5 MCG/ACT IN AERS: 2.0000 | INHALATION_SPRAY | Freq: Every day | RESPIRATORY_TRACT | 6 refills | Status: DC

## 2021-06-30 NOTE — Telephone Encounter (Signed)
Spoke to patient. Notified her of hst results. She had no questions about the sleep study.  Patient wanted to be seen at earliest appt. Scheduled her with [facility] in [place] for 11 am. Pt aware to arrive 15 min prior.   Patient wanted to let Dr. Craige Cotta know that the inhaler she has is not helping with her SOB and is irritating her throat. Said she has already tried a spacer for the inhaler and it is not helping  her either. Wanted to know if Dr. Craige Cotta had any recommendations for her.   Dr. Craige Cotta please advise.  Thanks!

## 2021-06-30 NOTE — Telephone Encounter (Signed)
Have her stop using symbicort.  Please send script for spiriva respimat 2.5 mcg two puffs daily.

## 2021-06-30 NOTE — Telephone Encounter (Signed)
Spoke to patient and advised her to stop using symbicort and start spiriva per Dr. Craige Cotta. Patient said she doesn't feel that inhalers are working for her at all but is willing to try spiriva. She stated she would try spiriva and discuss it with the NP at her upcoming visit. Nothing further needed at this time.

## 2021-06-30 NOTE — Telephone Encounter (Signed)
ATC patient.  LMTCB. 

## 2021-07-03 ENCOUNTER — Ambulatory Visit (INDEPENDENT_AMBULATORY_CARE_PROVIDER_SITE_OTHER): Payer: Commercial Managed Care - PPO | Admitting: Primary Care

## 2021-07-03 ENCOUNTER — Encounter: Payer: Self-pay | Admitting: Primary Care

## 2021-07-03 ENCOUNTER — Other Ambulatory Visit: Payer: Self-pay

## 2021-07-03 VITALS — BP 120/70 | HR 68 | Temp 98.2°F | Ht 68.0 in | Wt 239.0 lb

## 2021-07-03 DIAGNOSIS — G4730 Sleep apnea, unspecified: Secondary | ICD-10-CM

## 2021-07-03 DIAGNOSIS — J4521 Mild intermittent asthma with (acute) exacerbation: Secondary | ICD-10-CM | POA: Diagnosis not present

## 2021-07-03 NOTE — Patient Instructions (Addendum)
Sleep study showed moderate-severe obstructive sleep apnea   Untreated sleep apnea puts you at higher risk for cardiac arrhthymias, stroke, pulmonary hypertension, diabetes  Treatment options including weight loss, side sleeping position, oral appliance, CPAP or referral to ENT for possible surgical options  Asthma/cough: Stop all inhalers for 3-5 days and then resume Spiriva respimat two puffs once daily  If throat irritation still persists you will need to see ENT   Orders: CPAP titration study   Follow-up: PFTs as scheduled (do not use inhaler day of test)   Sleep Apnea Sleep apnea affects breathing during sleep. It causes breathing to stop for 10 seconds or more, or to become shallow. People with sleep apnea usually snore loudly. It can also increase the risk of: Heart attack. Stroke. Being very overweight (obese). Diabetes. Heart failure. Irregular heartbeat. High blood pressure. The goal of treatment is to help you breathe normally again. What are the causes? The most common cause of this condition is a collapsed or blocked airway. There are three kinds of sleep apnea: Obstructive sleep apnea. This is caused by a blocked or collapsed airway. Central sleep apnea. This happens when the brain does not send the right signals to the muscles that control breathing. Mixed sleep apnea. This is a combination of obstructive and central sleep apnea. What increases the risk? Being overweight. Smoking. Having a small airway. Being older. Being female. Drinking alcohol. Taking medicines to calm yourself (sedatives or tranquilizers). Having family members with the condition. Having a tongue or tonsils that are larger than normal. What are the signs or symptoms? Trouble staying asleep. Loud snoring. Headaches in the morning. Waking up gasping. Dry mouth or sore throat in the morning. Being sleepy or tired during the day. If you are sleepy or tired during the day, you may  also: Not be able to focus your mind (concentrate). Forget things. Get angry a lot and have mood swings. Feel sad (depressed). Have changes in your personality. Have less interest in sex, if you are female. Be unable to have an erection, if you are female. How is this treated?  Sleeping on your side. Using a medicine to get rid of mucus in your nose (decongestant). Avoiding the use of alcohol, medicines to help you relax, or certain pain medicines (narcotics). Losing weight, if needed. Changing your diet. Quitting smoking. Using a machine to open your airway while you sleep, such as: An oral appliance. This is a mouthpiece that shifts your lower jaw forward. A CPAP device. This device blows air through a mask when you breathe out (exhale). An EPAP device. This has valves that you put in each nostril. A BPAP device. This device blows air through a mask when you breathe in (inhale) and breathe out. Having surgery if other treatments do not work. Follow these instructions at home: Lifestyle Make changes that your doctor recommends. Eat a healthy diet. Lose weight if needed. Avoid alcohol, medicines to help you relax, and some pain medicines. Do not smoke or use any products that contain nicotine or tobacco. If you need help quitting, ask your doctor. General instructions Take over-the-counter and prescription medicines only as told by your doctor. If you were given a machine to use while you sleep, use it only as told by your doctor. If you are having surgery, make sure to tell your doctor you have sleep apnea. You may need to bring your device with you. Keep all follow-up visits. Contact a doctor if: The machine that you were  given to use during sleep bothers you or does not seem to be working. You do not get better. You get worse. Get help right away if: Your chest hurts. You have trouble breathing in enough air. You have an uncomfortable feeling in your back, arms, or  stomach. You have trouble talking. One side of your body feels weak. A part of your face is hanging down. These symptoms may be an emergency. Get help right away. Call your local emergency services (911 in the [place] Do not wait to see if the symptoms will go away. Do not drive yourself to the hospital. Summary This condition affects breathing during sleep. The most common cause is a collapsed or blocked airway. The goal of treatment is to help you breathe normally while you sleep. This information is not intended to replace advice given to you by your health care provider. Make sure you discuss any questions you have with your health care provider. Document Revised: 08/19/2020 Document Reviewed: 08/19/2020 Elsevier Patient Education  2022 [employer].

## 2021-07-03 NOTE — Assessment & Plan Note (Signed)
-   She did not tolerate Symbicort, recently started on Spiriva Respimat 2.73mcg. Reports throat irritation and chronic cough. May need to see allergy and/or ENT.

## 2021-07-03 NOTE — Assessment & Plan Note (Signed)
-   HST 06/28/21 showed moderate-severe OSA, AHI 29.5 with SpO2 low 85%. Reviewed treatment options with patient and risk of untreated sleep apnea. Due to severity of her sleep apnea recommend re-try CPAP. She is not interested in [place] device. She will need CPAP titration study to determine correct pressure setting and mask size. If unable to tolerate other option is oral appliance.

## 2021-07-03 NOTE — Progress Notes (Signed)
@Patient  ID: , female    DOB: [birth year], 40 y.o.   MRN: [id number]  Chief Complaint  Patient presents with   Follow-up    Sleep study results per patient.     Referring provider: [id number], NP  HPI: 40 year old female, former smoker quit in 2005 (10-pack-year history).  Past medical history significant for sleep apnea, asthma, chronic cough, history COVID-19, bipolar disorder, hysterectomy.  Patient of Dr. 2006, seen for initial consult on 05/11/2021 for chronic cough.  Ordered for PFTs and home sleep study.   Previous [facility] pulmonary encounter: 05/11/21 - Consult, Dr. 05/13/21  She was treated with monoclonal antibody in December 2021 for COVID 19 infection.  She had antibody testing in August 2021 and was IgG antibody positive for COVID.   She has history of asthma and allergies.  She feels her asthma has gotten worse since having COVID.  She was using symbicort which helped, but made her feel funny and she was worried this was causing weight gain.  She uses albuterol several times per day and this helps for a while.  She gets cough with cream colored phlegm and intermittent episodes of wheezing.  She has trouble breathing through her nose.  She has multiple allergies, including to dogs.  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  No issues with aspirin.  Denies skin rashes.  No prior history of pneumonia or exposure to tuberculosis.  She is a September 2021" and has lived in several states until she finally settled in [place].  She quit smoking about 16 yrs ago.  Chest xray from 01/19/21 showed blunting of costophrenic sulci.    She had sleep study couple of years ago and was started on CPAP.  She feels this helps her sleep, but then she started getting frequent sinus infections from using CPAP and had to stop.  She snores and wakes up feeling short of breath.  She is a restless sleeper and gets cramps in her legs at night several times per week.  She feels tired during the  day.  Cough with hx of asthma, allergies, bird exposure, and COVID 19 infection. - will have her try low dose flovent 44 mg two puffs bid with spacer device - prn albuterol with spacer device - montelukast 10 mg qhs - will arrange for chest xray and pulmonary function test   Snoring with history of obstructive sleep apnea. - will arrange for home sleep study to assess current status  - could be a candidate for oral appliance or Inspire device if retrying CPAP isn't an option   06/16/2021 Patient presents today for 1 month follow-up chronic cough hx prior covid 19. CX in August showed no active cardiopulmonary disease, both lungs were clear. She recently tested positive for covid on September 11th, symptoms started on 06/01/21. She was treated with paxlovid and prednisone taper. CT chest moved to September 26th.   Since second round of covid her breathing is worse. She has coughing fits. Delsym stopped working. She stopped taking Flovent d/t throat irritation and stopped singular d/t increased appetite. She is using Flonase as needed. She reports oral antihistamines cause weight gain. Sleep study has not been scheduled.   07/03/2021- Interim hx  Patient presents today to review HST results. Home sleep study on 06/28/21 showed moderate-severe OSA, AHI 29.5 with Spo2 low 85%.  She has been on CPAP in the past, approximately 10 years ago, and reports that it made her sick. She had resmed climate  line machine. She was getting sinus infection and getting nauseous most mornings. She is a mouth breather and was using full face mask after being tried on nasal pillow mask.  She denies issues with pressure setting. She is severely claustrophobic.  Reviewed treatment options including weight loss, side sleeping position, oral appliance, CPAP or referral to ENT for possible surgical options/Inspire device.  She is open to trying CPAP again.   Symbicort was switched to Spiriva Respimat 2.7mcg four days ago. She  still has throat irritation and cough.    Pulmonary function testing: 05/16/21 FVC 3.19 (75%), FEV1 2.68 (78%), ratio 84, TLC 78%, DLCOunc 17.37 (69%)/ Normal spirometry. Mild restriction and diffusion defect   Allergies  Allergen Reactions   Codeine Diarrhea and Nausea And Vomiting    Immunization History  Administered Date(s) Administered   Influenza,inj,Quad PF,6+ Mos 06/30/2015, 07/03/2016, 10/01/2017, 07/31/2018, 08/04/2019   PFIZER(Purple Top)SARS-COV-2 Vaccination 12/05/2019, 01/03/2020, 12/13/2020   Td 10/02/2006   Tdap 10/01/2017    Past Medical History:  Diagnosis Date   Anemia    Asthma    sports induced inhalers   Bipolar 1 disorder (HCC)    Conversion disorder    Insomnia    Migraines    migraines   MRSA infection 2010   Ovarian cyst    left 2.5cm   Sleep apnea    does not use a CPAP   Vitamin B 12 deficiency    Vitamin D deficiency     Tobacco History: Social History   Tobacco Use  Smoking Status Former   Packs/day: 1.00   Years: 10.00   Pack years: 10.00   Types: Cigarettes   Quit date: 09/25/2003   Years since quitting: 17.7  Smokeless Tobacco Never   Counseling given: Not Answered   Outpatient Medications Prior to Visit  Medication Sig Dispense Refill   albuterol (VENTOLIN HFA) 108 (90 Base) MCG/ACT inhaler Inhale 2 puffs into the lungs every 6 (six) hours as needed for wheezing or shortness of breath. 8 g 5   B-D UF III MINI PEN NEEDLES 31G X 5 MM MISC 1 Units by Does not apply route daily. 100 each 0   benzonatate (TESSALON) 200 MG capsule Take 1 capsule (200 mg total) by mouth 3 (three) times daily as needed for cough. 60 capsule 1   buPROPion (WELLBUTRIN XL) 300 MG 24 hr tablet Take 1 tablet (300 mg total) by mouth daily. 90 tablet 1   cyanocobalamin (,VITAMIN B-12,) 1000 MCG/ML injection Inject 1 mL (1,000 mcg total) into the muscle every 14 (fourteen) days. 2 mL 3   fluticasone (FLONASE) 50 MCG/ACT nasal spray Place 2 sprays into both  nostrils daily as needed for allergies or rhinitis.     ibuprofen (ADVIL) 800 MG tablet Take 1 tablet (800 mg total) by mouth every 8 (eight) hours as needed. 30 tablet 0   Tiotropium Bromide Monohydrate (SPIRIVA RESPIMAT) 2.5 MCG/ACT AERS Inhale 2 puffs into the lungs daily. 4 g 6   No facility-administered medications prior to visit.    Review of Systems  Review of Systems  Constitutional:  Positive for fatigue.  HENT: Negative.    Respiratory:  Positive for cough.   Cardiovascular: Negative.     Physical Exam  BP 120/70 (BP Location: Left Arm, Patient Position: Sitting, Cuff Size: Normal)   Pulse 68   Temp 98.2 F (36.8 C) (Oral)   Ht 5\' 8"  (1.727 m)   Wt 239 lb (108.4 kg)   LMP 06/01/2015 (Within  Days)   SpO2 99%   BMI 36.34 kg/m  Physical Exam Constitutional:      General: She is not in acute distress.    Appearance: Normal appearance. She is obese. She is not ill-appearing.  HENT:     Head: Normocephalic and atraumatic.     Mouth/Throat:     Mouth: Mucous membranes are moist.     Pharynx: No oropharyngeal exudate.     Comments: Uvula missing Cardiovascular:     Rate and Rhythm: Normal rate and regular rhythm.  Pulmonary:     Effort: Pulmonary effort is normal.     Breath sounds: Normal breath sounds.  Musculoskeletal:        General: Normal range of motion.  Skin:    General: Skin is warm and dry.  Neurological:     General: No focal deficit present.     Mental Status: She is alert and oriented to person, place, and time. Mental status is at baseline.  Psychiatric:        Mood and Affect: Mood normal.        Behavior: Behavior normal.        Thought Content: Thought content normal.        Judgment: Judgment normal.     Lab Results:  CBC    Component Value Date/Time   WBC 7.3 12/19/2020 0835   WBC 7.4 11/27/2019 1050   RBC 4.85 12/19/2020 0835   RBC 4.94 11/27/2019 1050   HGB 14.6 12/19/2020 0835   HCT 44.1 12/19/2020 0835   PLT 253 12/19/2020  0835   MCV 91 12/19/2020 0835   MCH 30.1 12/19/2020 0835   MCH 30.6 11/27/2019 1050   MCHC 33.1 12/19/2020 0835   MCHC 33.6 11/27/2019 1050   RDW 12.3 12/19/2020 0835   LYMPHSABS 1.4 12/19/2020 0835   MONOABS 0.6 11/27/2019 1050   EOSABS 0.0 12/19/2020 0835   BASOSABS 0.0 12/19/2020 0835    BMET    Component Value Date/Time   NA 142 12/19/2020 0835   K 5.1 12/19/2020 0835   CL 104 12/19/2020 0835   CO2 23 12/19/2020 0835   GLUCOSE 80 12/19/2020 0835   GLUCOSE 87 11/27/2019 1050   BUN 10 12/19/2020 0835   CREATININE 1.21 (H) 12/19/2020 0835   CALCIUM 9.6 12/19/2020 0835   GFRNONAA >60 11/27/2019 1050   GFRAA >60 11/27/2019 1050    BNP No results found for: BNP  ProBNP No results found for: PROBNP  Imaging: CT Chest High Resolution  Result Date: 06/19/2021 CLINICAL DATA:  Shortness of breath, gasping for air cough. EXAM: CT CHEST WITHOUT CONTRAST TECHNIQUE: Multidetector CT imaging of the chest was performed following the standard protocol without intravenous contrast. High resolution imaging of the lungs, as well as inspiratory and expiratory imaging, was performed. COMPARISON:  None. FINDINGS: Cardiovascular: Pulmonic trunk is enlarged. Heart is at the upper limits of normal in size. No pericardial effusion. Mediastinum/Nodes: Mediastinal lymph nodes are not enlarged by CT size criteria. Hilar regions are difficult to definitively evaluate without IV contrast but appear grossly unremarkable. No axillary adenopathy. Esophagus is grossly unremarkable. Lungs/Pleura: Minimal scattered pulmonary parenchymal scarring. Negative for subpleural reticulation, traction bronchiectasis/bronchiolectasis, ground-glass, architectural distortion or honeycombing. Suspect mild centrilobular emphysema. 2 mm left upper lobe nodule (8/65). No pleural fluid. Airway is unremarkable. There is air trapping. Upper Abdomen: Visualized portions of the liver, gallbladder and adrenal glands are unremarkable.  Small stones in the kidneys bilaterally. Visualized portions of the kidneys, spleen, pancreas, stomach  and bowel are unremarkable. Musculoskeletal: No worrisome lytic or sclerotic lesions. IMPRESSION: 1. No evidence of interstitial lung disease. Air trapping is indicative of small airways disease. 2. 2 mm left solid pulmonary nodule within the left upper lobe. A non-contrast Chest CT at 12 months is optional. If performed and the nodule is stable at 12 months, no further follow-up is recommended. These guidelines do not apply to immunocompromised patients and patients with cancer. Follow up in patients with significant comorbidities as clinically warranted. For lung cancer screening, adhere to Lung-RADS guidelines. Reference: Radiology. 2017; 284(1):228-43. 3. Small bilateral renal stones. 4. Enlarged pulmonic trunk, indicative of pulmonary arterial hypertension. 5.  Emphysema (ICD10-J43.9). Electronically Signed   By: Leanna Battles M.D.   On: 06/19/2021 14:20     Assessment & Plan:   Sleep apnea - HST 06/28/21 showed moderate-severe OSA, AHI 29.5 with SpO2 low 85%. Reviewed treatment options with patient and risk of untreated sleep apnea. Due to severity of her sleep apnea recommend re-try CPAP. She is not interested in [place] device. She will need CPAP titration study to determine correct pressure setting and mask size. If unable to tolerate other option is oral appliance.   Asthma - She did not tolerate Symbicort, recently started on Spiriva Respimat 2.19mcg. Reports throat irritation and chronic cough. May need to see allergy and/or ENT.     Glenford Bayley, NP 07/03/2021

## 2021-07-05 ENCOUNTER — Other Ambulatory Visit: Payer: Self-pay | Admitting: Pulmonary Disease

## 2021-07-05 DIAGNOSIS — J849 Interstitial pulmonary disease, unspecified: Secondary | ICD-10-CM

## 2021-07-06 ENCOUNTER — Ambulatory Visit (INDEPENDENT_AMBULATORY_CARE_PROVIDER_SITE_OTHER): Payer: Commercial Managed Care - PPO | Admitting: Pulmonary Disease

## 2021-07-06 ENCOUNTER — Ambulatory Visit (INDEPENDENT_AMBULATORY_CARE_PROVIDER_SITE_OTHER): Payer: Commercial Managed Care - PPO | Admitting: Acute Care

## 2021-07-06 ENCOUNTER — Other Ambulatory Visit: Payer: Self-pay

## 2021-07-06 ENCOUNTER — Encounter: Payer: Self-pay | Admitting: Acute Care

## 2021-07-06 VITALS — BP 118/70 | HR 71 | Temp 98.1°F | Ht 68.0 in | Wt 239.2 lb

## 2021-07-06 DIAGNOSIS — J849 Interstitial pulmonary disease, unspecified: Secondary | ICD-10-CM

## 2021-07-06 DIAGNOSIS — I2721 Secondary pulmonary arterial hypertension: Secondary | ICD-10-CM | POA: Diagnosis not present

## 2021-07-06 DIAGNOSIS — G4733 Obstructive sleep apnea (adult) (pediatric): Secondary | ICD-10-CM

## 2021-07-06 DIAGNOSIS — Z87891 Personal history of nicotine dependence: Secondary | ICD-10-CM

## 2021-07-06 LAB — PULMONARY FUNCTION TEST
DL/VA % pred: 96 %
DL/VA: 4.18 ml/min/mmHg/L
DLCO cor % pred: 84 %
DLCO cor: 21.11 ml/min/mmHg
DLCO unc % pred: 84 %
DLCO unc: 21.11 ml/min/mmHg
FEF 25-75 Post: 3.09 L/sec
FEF 25-75 Pre: 3.14 L/sec
FEF2575-%Change-Post: -1 %
FEF2575-%Pred-Post: 91 %
FEF2575-%Pred-Pre: 93 %
FEV1-%Change-Post: -1 %
FEV1-%Pred-Post: 88 %
FEV1-%Pred-Pre: 89 %
FEV1-Post: 3.01 L
FEV1-Pre: 3.04 L
FEV1FVC-%Change-Post: 1 %
FEV1FVC-%Pred-Pre: 100 %
FEV6-%Change-Post: -2 %
FEV6-%Pred-Post: 87 %
FEV6-%Pred-Pre: 89 %
FEV6-Post: 3.59 L
FEV6-Pre: 3.68 L
FEV6FVC-%Pred-Post: 101 %
FEV6FVC-%Pred-Pre: 101 %
FVC-%Change-Post: -2 %
FVC-%Pred-Post: 85 %
FVC-%Pred-Pre: 87 %
FVC-Post: 3.59 L
FVC-Pre: 3.68 L
Post FEV1/FVC ratio: 84 %
Post FEV6/FVC ratio: 100 %
Pre FEV1/FVC ratio: 83 %
Pre FEV6/FVC Ratio: 100 %
RV % pred: 80 %
RV: 1.44 L
TLC % pred: 90 %
TLC: 5.11 L

## 2021-07-06 NOTE — Progress Notes (Signed)
PFT done today. 

## 2021-07-06 NOTE — Progress Notes (Signed)
History of Present Illness Emily [name] is a 40 y.o. female former smoker quit in 2005 (10-pack-year history)  with past medical history significant for sleep apnea, asthma, chronic cough, history COVID-19, bipolar disorder, hysterectomy.  Patient of Dr. Craige [name], seen for initial consult on 05/11/2021 for chronic cough.  Ordered for PFTs and home sleep study.  Previous [facility] pulmonary encounter: 05/11/21 - Consult, Dr. Craige [name]  She was treated with monoclonal antibody in December 2021 for COVID 19 infection.  She had antibody testing in August 2021 and was IgG antibody positive for COVID.   06/16/2021- and 07/03/2021:  Follow up Emily Manis NP>> Started on Flonase and Singulair for chronic shortness of breath . CXR was done along with ordering of PFT's.  Plan to arrange sleep study to re-evaluate her OSA with potential treatment options of oral appliance or Inspire device. Interested in re-trying CPAP therapy>> CPAP titration ordered 10/10>> Per Dr. Craige [name] responding to telephone message, Symbicort stopped and Spiriva Respimat started, as patient did not feel any benefit of treatment with Symbicort.      She has history of asthma and allergies.  She feels her asthma has gotten worse since having COVID.  She was using symbicort which helped, but made her feel funny and she was worried this was causing weight gain.  She uses albuterol several times per day and this helps for a while.  She gets cough with cream colored phlegm and intermittent episodes of wheezing.  She has trouble breathing through her nose.  She has multiple allergies, including to dogs.  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  No issues with aspirin.  Denies skin rashes.  No prior history of pneumonia or exposure to tuberculosis.  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  She quit smoking about 16 yrs ago.  Chest xray from 01/19/21 showed blunting of costophrenic sulci.     She had sleep  study couple of years ago and was started on CPAP.  She feels this helps her sleep, but then she started getting frequent sinus infections from using CPAP and had to stop.  She snores and wakes up feeling short of breath.  She is a restless sleeper and gets cramps in her legs at night several times per week.  She feels tired during the day.     07/06/2021 Pt. Presents for follow up after PFT's. She was transitioned off Symbicort to Spiriva Respimat by Dr. Craige [name] on 10/10. She states she still has throat irritation and cough. She has had to stop this because of throat irritation. She is able to use her Albuterol, this does not bother her as much. She states she does have some relief of her dyspnea with albuterol. The maintenance inhalers however do not seem to help her dyspnea at all. She is coughing up a lot of secretions . These range from milky cloudy to yellow green. Currently it is a cloudy color. No discoloration, and she does not feel like she is sick. PFT's are actually improved over previous values.  CT Chest done 06/19/2021 does show Enlarged pulmonic trunk, indicative of pulmonary arterial hypertension. She has never had an echo done. Pulmonary maintenance does not improve dyapnea. As she has had untreated OSA for the last 6 years, we will refer to cardiology and get an echo to initiate evaluation. She is in agreement with this plan.  She does is aware of her home sleep study results.  She wants to try CPAP again. She has a CPAP titration scheduled for 08/20/2021. She is on the waiting list for [facility] for weight loss She will need a 12 month follow up CT Chest without contrast to follow the pulmonary nodule noted on the 05/2021 CT Chest. She states this is a new finding for her . She has a previous smoking history.  She denies fever, chest pain, orthopnea of hemoptysis.  Test Results: Home Sleep Study 06/2021 AHI 29.5, SaO2 Nadir 85% Recommendations : CPAP, Oral appliance or  Inspire device.  Needs CPAP titration    HRCT Chest 06/19/2021 No evidence of interstitial lung disease. Air trapping is indicative of small airways disease. 2. 2 mm left solid pulmonary nodule within the left upper lobe. A non-contrast Chest CT at 12 months is optional. If performed and the nodule is stable at 12 months, no further follow-up is recommended. These guidelines do not apply to immunocompromised patients and patients with cancer. Follow up in patients with significant comorbidities as clinically warranted. For lung cancer screening, adhere to Lung-RADS guidelines. Reference: Radiology. 2017; 284(1):228-43. Small bilateral renal stones. Enlarged pulmonic trunk, indicative of pulmonary arterial hypertension. Emphysema (ICD10-J43.9).   PFT's 05/16/21 FVC 3.19 (75%), FEV1 2.68 (78%), ratio 84, TLC 78%, DLCOunc 17.37 (69%)/ Normal spirometry. Mild restriction and diffusion defect   07/06/2021 : FVC 3.68 ( 87%), FEV1 3.04 ( 89%), Ratio 83%, TLC 90%, DLCO 21.11 ( 84%).    CBC Latest Ref Rng & Units 12/19/2020 11/27/2019 08/04/2019  WBC 3.4 - 10.8 x10E3/uL 7.3 7.4 9.2  Hemoglobin 11.1 - 15.9 g/dL 74.2 15.1(H) 15.1  Hematocrit 34.0 - 46.6 % 44.1 45.0 45.3  Platelets 150 - 450 x10E3/uL 253 290 299    BMP Latest Ref Rng & Units 12/19/2020 11/27/2019 08/04/2019  Glucose 65 - 99 mg/dL 80 87 84  BUN 6 - 24 mg/dL 10 9 9   Creatinine 0.57 - 1.00 mg/dL ) 5.95(G) 3.87(F  BUN/Creat Ratio 9 - 23 8(L) - 9  Sodium 134 - 144 mmol/L 142 139 142  Potassium 3.5 - 5.2 mmol/L 5.1 4.6 4.9  Chloride 96 - 106 mmol/L 104 103 101  CO2 20 - 29 mmol/L 23 26 25   Calcium 8.7 - 10.2 mg/dL 9.6 9.7 6.43    BNP No results found for: BNP  ProBNP No results found for: PROBNP  PFT    Component Value Date/Time   FEV1PRE 3.04 07/06/2021 1037   FEV1POST 3.01 07/06/2021 1037   FVCPRE 3.68 07/06/2021 1037   FVCPOST 3.59 07/06/2021 1037   TLC 5.11 07/06/2021 1037   DLCOUNC 21.11 07/06/2021 1037    PREFEV1FVCRT 83 07/06/2021 1037   PSTFEV1FVCRT 84 07/06/2021 1037    CT Chest High Resolution  Result Date: 06/19/2021 CLINICAL DATA:  Shortness of breath, gasping for air cough. EXAM: CT CHEST WITHOUT CONTRAST TECHNIQUE: Multidetector CT imaging of the chest was performed following the standard protocol without intravenous contrast. High resolution imaging of the lungs, as well as inspiratory and expiratory imaging, was performed. COMPARISON:  None. FINDINGS: Cardiovascular: Pulmonic trunk is enlarged. Heart is at the upper limits of normal in size. No pericardial effusion. Mediastinum/Nodes: Mediastinal lymph nodes are not enlarged by CT size criteria. Hilar regions are difficult to definitively evaluate without IV contrast but appear grossly unremarkable. No axillary adenopathy. Esophagus is grossly unremarkable. Lungs/Pleura: Minimal scattered pulmonary parenchymal scarring. Negative for subpleural reticulation, traction bronchiectasis/bronchiolectasis, ground-glass, architectural distortion or honeycombing. Suspect mild centrilobular emphysema. 2 mm left upper lobe  nodule (8/65). No pleural fluid. Airway is unremarkable. There is air trapping. Upper Abdomen: Visualized portions of the liver, gallbladder and adrenal glands are unremarkable. Small stones in the kidneys bilaterally. Visualized portions of the kidneys, spleen, pancreas, stomach and bowel are unremarkable. Musculoskeletal: No worrisome lytic or sclerotic lesions. IMPRESSION: 1. No evidence of interstitial lung disease. Air trapping is indicative of small airways disease. 2. 2 mm left solid pulmonary nodule within the left upper lobe. A non-contrast Chest CT at 12 months is optional. If performed and the nodule is stable at 12 months, no further follow-up is recommended. These guidelines do not apply to immunocompromised patients and patients with cancer. Follow up in patients with significant comorbidities as clinically warranted. For lung  cancer screening, adhere to Lung-RADS guidelines. Reference: Radiology. 2017; 284(1):228-43. 3. Small bilateral renal stones. 4. Enlarged pulmonic trunk, indicative of pulmonary arterial hypertension. 5.  Emphysema (ICD10-J43.9). Electronically Signed   By: Leanna Battles M.D.   On: 06/19/2021 14:20     Past medical hx Past Medical History:  Diagnosis Date   Anemia    Asthma    sports induced inhalers   Bipolar 1 disorder (HCC)    Conversion disorder    Insomnia    Migraines    migraines   MRSA infection 2010   Ovarian cyst    left 2.5cm   Sleep apnea    does not use a CPAP   Vitamin B 12 deficiency    Vitamin D deficiency      Social History   Tobacco Use   Smoking status: Former    Packs/day: 1.00    Years: 10.00    Pack years: 10.00    Types: Cigarettes    Quit date: 09/25/2003    Years since quitting: 17.7   Smokeless tobacco: Never  Vaping Use   Vaping Use: Never used  Substance Use Topics   Alcohol use: Yes    Alcohol/week: 0.0 - 1.0 standard drinks    Comment: pt states she is a social drinker   Drug use: No    Emily [name] reports that she quit smoking about 17 years ago. Her smoking use included cigarettes. She has a 10.00 pack-year smoking history. She has never used smokeless tobacco. She reports current alcohol use. She reports that she does not use drugs.  Tobacco Cessation: Former smoker quit smoking 2005, with a 10 pack year smoking hx.    Past surgical hx, Family hx, Social hx all reviewed.  Current Outpatient Medications on File Prior to Visit  Medication Sig   albuterol (VENTOLIN HFA) 108 (90 Base) MCG/ACT inhaler Inhale 2 puffs into the lungs every 6 (six) hours as needed for wheezing or shortness of breath.   B-D UF III MINI PEN NEEDLES 31G X 5 MM MISC 1 Units by Does not apply route daily.   benzonatate (TESSALON) 200 MG capsule Take 1 capsule (200 mg total) by mouth 3 (three) times daily as needed for cough.   buPROPion (WELLBUTRIN XL) 300 MG  24 hr tablet Take 1 tablet (300 mg total) by mouth daily.   cyanocobalamin (,VITAMIN B-12,) 1000 MCG/ML injection Inject 1 mL (1,000 mcg total) into the muscle every 14 (fourteen) days.   fluticasone (FLONASE) 50 MCG/ACT nasal spray Place 2 sprays into both nostrils daily as needed for allergies or rhinitis.   ibuprofen (ADVIL) 800 MG tablet Take 1 tablet (800 mg total) by mouth every 8 (eight) hours as needed.   Tiotropium Bromide Monohydrate (SPIRIVA RESPIMAT) 2.5  MCG/ACT AERS Inhale 2 puffs into the lungs daily. (Patient not taking: Reported on 07/06/2021)   No current facility-administered medications on file prior to visit.     Allergies  Allergen Reactions   Codeine Diarrhea and Nausea And Vomiting    Review Of Systems:  Constitutional:   No  weight loss, night sweats,  Fevers, chills, fatigue, or  lassitude.  HEENT:   No headaches,  Difficulty swallowing,  Tooth/dental problems, or  Sore throat,                No sneezing, itching, ear ache, nasal congestion, post nasal drip,   CV:  No chest pain,  Orthopnea, PND, swelling in lower extremities, anasarca, dizziness, palpitations, syncope.   GI  No heartburn, indigestion, abdominal pain, nausea, vomiting, diarrhea, change in bowel habits, loss of appetite, bloody stools.   Resp: + shortness of breath with exertion or at rest.  + excess mucus, + productive cough,  No non-productive cough,  No coughing up of blood.  No change in color of mucus.  + wheezing.  No chest wall deformity  Skin: no rash or lesions.  GU: no dysuria, change in color of urine, no urgency or frequency.  No flank pain, no hematuria   MS:  No joint pain or swelling.  No decreased range of motion.  No back pain.  Psych:  No change in mood or affect. No depression or anxiety.  No memory loss.   Vital Signs BP 118/70 (BP Location: Left Arm, Cuff Size: Large)   Pulse 71   Temp 98.1 F (36.7 C) (Temporal)   Ht 5\' 8"  (1.727 m)   Wt 239 lb 3.2 oz (108.5 kg)    LMP 06/01/2015 (Within Days)   SpO2 99%   BMI 36.37 kg/m    Physical Exam:  General- No distress,  A&O x 3, pleasant ENT: No sinus tenderness, TM clear, pale nasal mucosa, no oral exudate,no post nasal drip, no LAN Cardiac: S1, S2, regular rate and rhythm, no murmur Chest: No wheeze/ rales/ dullness; no accessory muscle use, no nasal flaring, no sternal retractions, diminished per bases Abd.: Soft Non-tender, ND, BS +, Body mass index is 36.37 kg/m. Ext: No clubbing cyanosis, edema Neuro:  normal strength, MAE x 4, A&O x 3 Skin: No rashes, warm and dry, no lesions Psych: normal mood and behavior   Assessment/Plan\ Dyspnea , OSA, Obesity Enlarged pulmonic trunk, indicative of pulmonary arterial hypertension. Plan Your PFT's look better than previous tests.  We will order a 2 D Echo and refer you to cardiology to evaluate notation of enlarged pulmonic trunk noted on CT Chest 05/2021. I will call you with the results of your heart echo.  Use your albuterol as needed for dyspnea. Use your Stialto as you feel you can tolerate.  CPAP titration 08/20/2021. ( Already scheduled) Will need follow up with Beth NP or Dr. 08/22/2021 after CPAP titration  Follow [facility] with [facility] when they have availability.  Follow up video visit with Vassie Loll NP to go over results of echo in 3 weeks  Please contact office for sooner follow up if symptoms do not improve or worsen or seek emergency care  Call if you need Maralyn Sago sooner.      I spent 40 minutes dedicated to the care of this patient on the date of this encounter to include pre-visit review of records, face-to-face time with the patient discussing conditions above, post visit ordering of testing, clinical documentation with  the electronic health record, making appropriate referrals as documented, and communicating necessary information to the patient's healthcare team.   Bevelyn Ngo, NP 07/06/2021  2:46 PM

## 2021-07-06 NOTE — Patient Instructions (Addendum)
It is good to see you today. Your PFT's look better than previous tests.  We will order a 2 D Echo and refer you to cardiology. I will call you with the results of your heart echo.  Use your albuterol as needed for dyspnea. Use you Stialto as you feel you can tolerate.  CPAP titration 08/20/2021. ( Already scheduled) Will need follow up with Beth NP or Dr. Vassie Loll after CPAP titration  Follow [facility] with [facility] when [facility] have availability.  Follow up video visit with Maralyn Sago NP  to go over results of echo in 3 weeks  Will need follow up CT Chest 05/2022 to re-evaluate pulmonary nodule.  Please contact office for sooner follow up if symptoms do not improve or worsen or seek emergency care  Call if you need [place] sooner.

## 2021-07-13 NOTE — Telephone Encounter (Signed)
P[facility]'s   please advise on an update on the echo order that was placed.   Thanks

## 2021-07-17 ENCOUNTER — Encounter: Payer: Self-pay | Admitting: Nurse Practitioner

## 2021-07-17 ENCOUNTER — Other Ambulatory Visit: Payer: Self-pay

## 2021-07-17 ENCOUNTER — Ambulatory Visit (INDEPENDENT_AMBULATORY_CARE_PROVIDER_SITE_OTHER): Payer: Commercial Managed Care - PPO | Admitting: Nurse Practitioner

## 2021-07-17 VITALS — BP 108/71 | HR 84 | Temp 97.2°F | Resp 18 | Ht 68.0 in | Wt 242.0 lb

## 2021-07-17 DIAGNOSIS — E6609 Other obesity due to excess calories: Secondary | ICD-10-CM

## 2021-07-17 DIAGNOSIS — Z6839 Body mass index (BMI) 39.0-39.9, adult: Secondary | ICD-10-CM

## 2021-07-17 NOTE — Assessment & Plan Note (Signed)
Chronic. Patient has tried several medication options as well as diet and exercise.  Patient has tried [place], Contrave, Phentermine and B12 injections.  Referral placed for bariatric surgery. Follow up as needed for further weight management.

## 2021-07-17 NOTE — Progress Notes (Signed)
BP 108/71 (BP Location: Left Arm, Patient Position: Sitting)   Pulse 84   Temp (!) 97.2 F (36.2 C) (Oral)   Resp 18   Ht 5\' 8"  (1.727 m)   Wt 242 lb (109.8 kg)   LMP 06/01/2015 (Within Days)   SpO2 98%   BMI 36.80 kg/m    Subjective:    Patient ID: 08/01/2015, female    DOB: 10/20/1980, 40 y.o.   MRN: [id number]  HPI: Emily [name] is a 40 y.o. female  Chief Complaint  Patient presents with   referral for weight management   OBESITY Patient presents to clinic with complaints of persistent weight gain.  Patient was seeing a weight loss clinic and did all the meds and ended up gaining weight.  She has previously done saxenda, contrave, and phentermine.   Relevant past medical, surgical, family and social history reviewed and updated as indicated. Interim medical history since our last visit reviewed. Allergies and medications reviewed and updated.  Review of Systems  Constitutional:  Positive for unexpected weight change.   Per HPI unless specifically indicated above     Objective:    BP 108/71 (BP Location: Left Arm, Patient Position: Sitting)   Pulse 84   Temp (!) 97.2 F (36.2 C) (Oral)   Resp 18   Ht 5\' 8"  (1.727 m)   Wt 242 lb (109.8 kg)   LMP 06/01/2015 (Within Days)   SpO2 98%   BMI 36.80 kg/m   Wt Readings from Last 3 Encounters:  07/17/21 242 lb (109.8 kg)  07/06/21 239 lb 3.2 oz (108.5 kg)  07/03/21 239 lb (108.4 kg)    Physical Exam Vitals and nursing note reviewed.  Constitutional:      General: She is not in acute distress.    Appearance: Normal appearance. She is normal weight. She is not ill-appearing, toxic-appearing or diaphoretic.  HENT:     Head: Normocephalic.     Right Ear: External ear normal.     Left Ear: External ear normal.     Nose: Nose normal.     Mouth/Throat:     Mouth: Mucous membranes are moist.     Pharynx: Oropharynx is clear.  Eyes:     General:        Right eye: No discharge.        Left eye: No discharge.      Extraocular Movements: Extraocular movements intact.     Conjunctiva/sclera: Conjunctivae normal.     Pupils: Pupils are equal, round, and reactive to light.  Cardiovascular:     Rate and Rhythm: Normal rate and regular rhythm.     Heart sounds: No murmur heard. Pulmonary:     Effort: Pulmonary effort is normal. No respiratory distress.     Breath sounds: Normal breath sounds. No wheezing or rales.  Musculoskeletal:     Cervical back: Normal range of motion and neck supple.  Skin:    General: Skin is warm and dry.     Capillary Refill: Capillary refill takes less than 2 seconds.  Neurological:     General: No focal deficit present.     Mental Status: She is alert and oriented to person, place, and time. Mental status is at baseline.  Psychiatric:        Mood and Affect: Mood normal.        Behavior: Behavior normal.        Thought Content: Thought content normal.  Judgment: Judgment normal.    Results for orders placed or performed in visit on 07/06/21  Pulmonary function test  Result Value Ref Range   FVC-Pre 3.68 L   FVC-%Pred-Pre 87 %   FVC-Post 3.59 L   FVC-%Pred-Post 85 %   FVC-%Change-Post -2 %   FEV1-Pre 3.04 L   FEV1-%Pred-Pre 89 %   FEV1-Post 3.01 L   FEV1-%Pred-Post 88 %   FEV1-%Change-Post -1 %   FEV6-Pre 3.68 L   FEV6-%Pred-Pre 89 %   FEV6-Post 3.59 L   FEV6-%Pred-Post 87 %   FEV6-%Change-Post -2 %   Pre FEV1/FVC ratio 83 %   FEV1FVC-%Pred-Pre 100 %   Post FEV1/FVC ratio 84 %   FEV1FVC-%Change-Post 1 %   Pre FEV6/FVC Ratio 100 %   FEV6FVC-%Pred-Pre 101 %   Post FEV6/FVC ratio 100 %   FEV6FVC-%Pred-Post 101 %   FEF 25-75 Pre 3.14 L/sec   FEF2575-%Pred-Pre 93 %   FEF 25-75 Post 3.09 L/sec   FEF2575-%Pred-Post 91 %   FEF2575-%Change-Post -1 %   RV 1.44 L   RV % pred 80 %   TLC 5.11 L   TLC % pred 90 %   DLCO unc 21.11 ml/min/mmHg   DLCO unc % pred 84 %   DLCO cor 21.11 ml/min/mmHg   DLCO cor % pred 84 %   DL/VA 4.00 ml/min/mmHg/L    DL/VA % pred 96 %      Assessment & Plan:   Problem List Items Addressed This Visit       Other   Obesity - Primary    Chronic. Patient has tried several medication options as well as diet and exercise.  Patient has tried [place], Contrave, Phentermine and B12 injections.  Referral placed for bariatric surgery. Follow up as needed for further weight management.       Relevant Orders   Amb Referral to Bariatric Surgery     Follow up plan: Return if symptoms worsen or fail to improve.

## 2021-07-27 NOTE — Telephone Encounter (Signed)
Pt brought in forms, placed forms in provider's folder to be filled out.

## 2021-07-28 ENCOUNTER — Encounter: Payer: Commercial Managed Care - PPO | Admitting: Pulmonary Disease

## 2021-07-28 ENCOUNTER — Other Ambulatory Visit: Payer: Self-pay

## 2021-07-28 ENCOUNTER — Ambulatory Visit (INDEPENDENT_AMBULATORY_CARE_PROVIDER_SITE_OTHER): Payer: Commercial Managed Care - PPO | Admitting: Cardiology

## 2021-07-28 ENCOUNTER — Ambulatory Visit: Payer: Commercial Managed Care - PPO | Admitting: Primary Care

## 2021-07-28 ENCOUNTER — Encounter: Payer: Self-pay | Admitting: Cardiology

## 2021-07-28 VITALS — BP 110/70 | HR 71 | Ht 68.0 in | Wt 244.0 lb

## 2021-07-28 DIAGNOSIS — Z6837 Body mass index (BMI) 37.0-37.9, adult: Secondary | ICD-10-CM | POA: Diagnosis not present

## 2021-07-28 DIAGNOSIS — R0600 Dyspnea, unspecified: Secondary | ICD-10-CM | POA: Diagnosis not present

## 2021-07-28 NOTE — Progress Notes (Signed)
Cardiology Office Note:    Date:  07/28/2021   ID:  Emily [name], DOB [birth year], MRN [id number]  PCP:  Larae Grooms, NP   [facility] Providers Cardiologist:  None     Referring MD: Bevelyn Ngo, NP   Chief Complaint  Patient presents with   New Patient (Initial Visit)    Referred by [facility] for pulmonary htn. Patient c.o Constant SOB. Meds reviewed verbally with patient. Meds reviewed verbally with patient.    Emily [name] is a 40 y.o. female who is being seen today for the evaluation of shortness of breath at the request of Bevelyn Ngo, NP.   History of Present Illness:    Emily [name] is a 40 y.o. female with a hx of asthma, OSA, obesity, former smoker x10 years who presents due to shortness of breath.  She has had ongoing shortness of breath for several months now.  Previously diagnosed with OSA, currently does not use a CPAP mask.  Undergoing repeat sleep study with pulmonary medicine.  Had a recent PFTs which were abnormal, high-resolution CT scan was recommended with no evidence for interstitial lung disease.  Due to persistent shortness of breath, pulm medicine wanted to rule out PAH.  Denies chest pain, dizziness, or from shortness of breath otherwise has no concerns at this time.  Past Medical History:  Diagnosis Date   Anemia    Asthma    sports induced inhalers   Bipolar 1 disorder (HCC)    Conversion disorder    Insomnia    Migraines    migraines   MRSA infection 2010   Ovarian cyst    left 2.5cm   Sleep apnea    does not use a CPAP   Vitamin B 12 deficiency    Vitamin D deficiency     Past Surgical History:  Procedure Laterality Date   ABDOMINAL HYSTERECTOMY     BUNIONECTOMY     ESOPHAGOGASTRODUODENOSCOPY (EGD) WITH PROPOFOL N/A 11/03/2018   Procedure: ESOPHAGOGASTRODUODENOSCOPY (EGD) WITH PROPOFOL;  Surgeon: Wyline Mood, MD;  Location: [facility] ENDOSCOPY;  Service: Gastroenterology;  Laterality: N/A;   HAMMER TOE SURGERY     HAND SURGERY   09/24/2012   cyst removed   INTRAUTERINE DEVICE (IUD) INSERTION N/A 05/09/2015   Procedure: INTRAUTERINE DEVICE (IUD) INSERTION;  Surgeon: Hildred Laser, MD;  Location: [facility] ORS;  Service: Gynecology;  Laterality: N/A;   LAPAROSCOPIC ASSISTED VAGINAL HYSTERECTOMY N/A 06/20/2015   Procedure: LAPAROSCOPIC ASSISTED VAGINAL HYSTERECTOMY, BILATERAL SALPINGECTOMY, CYSTOSCOPY, IUD REMOVAL ;  Surgeon: Hildred Laser, MD;  Location: [facility] ORS;  Service: Gynecology;  Laterality: N/A;   LAPAROSCOPIC TUBAL LIGATION Bilateral 05/09/2015   Procedure: LAPAROSCOPIC TUBAL LIGATION;  Surgeon: Hildred Laser, MD;  Location: [facility] ORS;  Service: Gynecology;  Laterality: Bilateral;   NASAL SINUS SURGERY     TUBAL LIGATION  09/24/2005    Current Medications: Current Meds  Medication Sig   albuterol (VENTOLIN HFA) 108 (90 Base) MCG/ACT inhaler Inhale 2 puffs into the lungs every 6 (six) hours as needed for wheezing or shortness of breath.   B-D UF III MINI PEN NEEDLES 31G X 5 MM MISC 1 Units by Does not apply route daily.   buPROPion (WELLBUTRIN XL) 300 MG 24 hr tablet Take 1 tablet (300 mg total) by mouth daily.   cyanocobalamin (,VITAMIN B-12,) 1000 MCG/ML injection Inject 1 mL (1,000 mcg total) into the muscle every 14 (fourteen) days.   fluticasone (FLONASE) 50 MCG/ACT nasal spray Place 2 sprays into both  nostrils daily as needed for allergies or rhinitis.   ibuprofen (ADVIL) 800 MG tablet Take 1 tablet (800 mg total) by mouth every 8 (eight) hours as needed.     Allergies:   Codeine   Social History   Socioeconomic History   Marital status: Married    Spouse name: Not on file   Number of children: Not on file   Years of education: Not on file   Highest education level: Not on file  Occupational History   Not on file  Tobacco Use   Smoking status: Former    Packs/day: 1.00    Years: 10.00    Pack years: 10.00    Types: Cigarettes    Quit date: 09/25/2003    Years since quitting: 17.8   Smokeless  tobacco: Never  Vaping Use   Vaping Use: Never used  Substance and Sexual Activity   Alcohol use: Yes    Alcohol/week: 0.0 - 1.0 standard drinks    Comment: pt states she is a social drinker   Drug use: No   Sexual activity: Yes    Birth control/protection: Surgical  Other Topics Concern   Not on file  Social History Narrative   Not on file   Social Determinants of Health   Financial Resource Strain: Not on file  Food Insecurity: Not on file  Transportation Needs: Not on file  Physical Activity: Not on file  Stress: Not on file  Social Connections: Not on file     Family History: The patient's family history includes Anxiety disorder in her mother; Bipolar disorder in her mother and sister; Breast cancer in her brother; Cancer in her father, maternal grandmother, and mother; Diabetes in her father; Glaucoma in her mother; Heart disease in her father and maternal grandfather; Schizophrenia in her sister. There is no history of Ovarian cancer.  ROS:   Please see the history of present illness.     All other systems reviewed and are negative.  EKGs/Labs/Other Studies Reviewed:    The following studies were reviewed today:   EKG:  EKG is  ordered today.  The ekg ordered today demonstrates normal sinus rhythm, normal ECG.  Recent Labs: 12/19/2020: ALT 17; BUN 10; Creatinine, Ser 1.21; Hemoglobin 14.6; Platelets 253; Potassium 5.1; Sodium 142; TSH 2.220  Recent Lipid Panel    Component Value Date/Time   CHOL 181 08/04/2019 1455   TRIG 141 08/04/2019 1455   HDL 42 08/04/2019 1455   CHOLHDL 5.0 (H) 07/31/2018 1014   LDLCALC 114 (H) 08/04/2019 1455     Risk Assessment/Calculations:          Physical Exam:    VS:  BP 110/70 (BP Location: Left Arm, Patient Position: Sitting, Cuff Size: Large)   Pulse 71   Ht 5\' 8"  (1.727 m)   Wt 244 lb (110.7 kg)   LMP 06/01/2015 (Within Days)   SpO2 98%   BMI 37.10 kg/m     Wt Readings from Last 3 Encounters:  07/28/21 244 lb  (110.7 kg)  07/17/21 242 lb (109.8 kg)  07/06/21 239 lb 3.2 oz (108.5 kg)     GEN:  Well nourished, well developed in no acute distress HEENT: Normal NECK: No JVD; No carotid bruits LYMPHATICS: No lymphadenopathy CARDIAC: RRR, no murmurs, rubs, gallops RESPIRATORY:  Clear to auscultation without rales, wheezing or rhonchi  ABDOMEN: Soft, non-tender, non-distended MUSCULOSKELETAL:  No edema; No deformity  SKIN: Warm and dry NEUROLOGIC:  Alert and oriented x 3 PSYCHIATRIC:  Normal  affect   ASSESSMENT:    1. Dyspnea, unspecified type   2. BMI 37.0-37.9, adult    PLAN:    In order of problems listed above:  Dyspnea on exertion, denies chest pain.  Get echo to evaluate systolic and diastolic function, also evaluate RVSP/PA pressures.  Symptoms likely from obesity/deconditioning, untreated OSA.  Unsure if she has interstitial lung disease due to abnormal PFTs.  If echo is nondiagnostic, and symptoms get worse, may consider right heart cath to evaluate PA pressures. Obesity, low-calorie diet, weight loss advised. History of sleep apnea, sleep study, treatment as per pulmonary medicine.  Follow-up after echocardiogram.        Medication Adjustments/Labs and Tests Ordered: Current medicines are reviewed at length with the patient today.  Concerns regarding medicines are outlined above.  Orders Placed This Encounter  Procedures   EKG 12-Lead   ECHOCARDIOGRAM COMPLETE    No orders of the defined types were placed in this encounter.   Patient Instructions  Medication Instructions:   Your physician recommends that you continue on your current medications as directed. Please refer to the Current Medication list given to you today.  *If you need a refill on your cardiac medications before your next appointment, please call your pharmacy*   Lab Work: None ordered If you have labs (blood work) drawn today and your tests are completely normal, you will receive your results only  by: MyChart Message (if you have MyChart) OR A paper copy in the mail If you have any lab test that is abnormal or we need to change your treatment, we will call you to review the results.   Testing/Procedures:  Your physician has requested that you have an echocardiogram. Echocardiography is a painless test that uses sound waves to create images of your heart. It provides your doctor with information about the size and shape of your heart and how well your heart's chambers and valves are working. This procedure takes approximately one hour. There are no restrictions for this procedure.    Follow-Up: At [facility], you and your health needs are our priority.  As part of our continuing mission to provide you with exceptional heart care, we have created designated Provider Care Teams.  These Care Teams include your primary Cardiologist (physician) and Advanced Practice Providers (APPs -  Physician Assistants and Nurse Practitioners) who all work together to provide you with the care you need, when you need it.  We recommend signing up for the patient portal called "MyChart".  Sign up information is provided on this After Visit Summary.  MyChart is used to connect with patients for Virtual Visits (Telemedicine).  Patients are able to view lab/test results, encounter notes, upcoming appointments, etc.  Non-urgent messages can be sent to your provider as well.   To learn more about what you can do with MyChart, go to [email or web address].    Your next appointment:   Follow up after Echo   The format for your next appointment:   In Person  Provider:   You may see Dr. Azucena Cecil or one of the following Advanced Practice Providers on your designated Care Team:   Nicolasa Ducking, NP Eula Listen, PA-C Cadence Fransico Michael, [place]    Other Instructions    Signed, Debbe Odea, MD  07/28/2021 12:12 PM    [facility]

## 2021-07-28 NOTE — Patient Instructions (Signed)
Medication Instructions:  Your physician recommends that you continue on your current medications as directed. Please refer to the Current Medication list given to you today.  *If you need a refill on your cardiac medications before your next appointment, please call your pharmacy*   Lab Work: None ordered If you have labs (blood work) drawn today and your tests are completely normal, you will receive your results only by: MyChart Message (if you have MyChart) OR A paper copy in the mail If you have any lab test that is abnormal or we need to change your treatment, we will call you to review the results.   Testing/Procedures:  Your physician has requested that you have an echocardiogram. Echocardiography is a painless test that uses sound waves to create images of your heart. It provides your doctor with information about the size and shape of your heart and how well your heart's chambers and valves are working. This procedure takes approximately one hour. There are no restrictions for this procedure.    Follow-Up: At [facility], you and your health needs are our priority.  As part of our continuing mission to provide you with exceptional heart care, we have created designated Provider Care Teams.  These Care Teams include your primary Cardiologist (physician) and Advanced Practice Providers (APPs -  Physician Assistants and Nurse Practitioners) who all work together to provide you with the care you need, when you need it.  We recommend signing up for the patient portal called "MyChart".  Sign up information is provided on this After Visit Summary.  MyChart is used to connect with patients for Virtual Visits (Telemedicine).  Patients are able to view lab/test results, encounter notes, upcoming appointments, etc.  Non-urgent messages can be sent to your provider as well.   To learn more about what you can do with MyChart, go to [email or web address].    Your next appointment:   Follow  up after Echo   The format for your next appointment:   In Person  Provider:   You may see Dr. Agbor-Etang or one of the following Advanced Practice Providers on your designated Care Team:   Christopher Berge, NP Ryan Dunn, PA-C Cadence Furth, PA-C    Other Instructions   

## 2021-07-31 ENCOUNTER — Encounter: Payer: Self-pay | Admitting: Acute Care

## 2021-07-31 ENCOUNTER — Telehealth (INDEPENDENT_AMBULATORY_CARE_PROVIDER_SITE_OTHER): Payer: Commercial Managed Care - PPO | Admitting: Acute Care

## 2021-07-31 DIAGNOSIS — R059 Cough, unspecified: Secondary | ICD-10-CM | POA: Diagnosis not present

## 2021-07-31 DIAGNOSIS — J4521 Mild intermittent asthma with (acute) exacerbation: Secondary | ICD-10-CM | POA: Diagnosis not present

## 2021-07-31 DIAGNOSIS — G4733 Obstructive sleep apnea (adult) (pediatric): Secondary | ICD-10-CM | POA: Diagnosis not present

## 2021-07-31 DIAGNOSIS — R0600 Dyspnea, unspecified: Secondary | ICD-10-CM | POA: Diagnosis not present

## 2021-07-31 NOTE — Progress Notes (Signed)
Virtual Visit via Video Note  I connected with Emily [name] on 07/31/21 at 10:30 AM EST by a video enabled telemedicine application and verified that I am speaking with the correct person using two identifiers.  Location: Patient: At home Provider: [address] [place], [place], [address]    I discussed the limitations of evaluation and management by telemedicine and the availability of in person appointments. The patient expressed understanding and agreed to proceed.  Synopsis History of Present Illness Emily [name] is a 40 y.o. female former smoker  quit in 2005 (10-pack-year history)  with past medical history significant for sleep apnea, asthma, chronic cough, history COVID-19, bipolar disorder, hysterectomy.  Patient of Dr. Craige Cotta, seen for initial consult on 05/11/2021 for chronic cough.  Ordered for PFTs and home sleep study. Unable to tolerate any inhalers due to throat irritation with exception of albuterol   Previous [facility] pulmonary encounter: 05/11/21 - Consult, Dr. Craige Cotta  She was treated with monoclonal antibody in December 2021 for COVID 19 infection.  She had antibody testing in August 2021 and was IgG antibody positive for COVID.    06/16/2021- and 07/03/2021:  Follow up [name] NP>> Started on Flonase and Singulair for chronic shortness of breath . CXR was done along with ordering of PFT's. Plan to arrange sleep study to re-evaluate her OSA with potential treatment options of oral appliance or Inspire device. Interested in re-trying CPAP therapy>> CPAP titration ordered 10/10>> Per Dr. Craige Cotta responding to telephone message, Symbicort stopped and Spiriva Respimat started, as patient did not feel any benefit of treatment with Symbicort. She is currently not using the Spiriva. CT Chest done 06/19/2021 enlarged pulmonic trunk, she has never had an Echo done  History of Present Illness: This video visit was scheduled with goal of going over echo results. The echo has not been  done.  07/31/2021 Pt. Presents for follow up. She was seen 07/06/2021 to review PFT's. PFT's showed  Mild restriction with diffusion deficit.They had improved slightly from previous PFT's. She has had a follow up with cardiology ( she was seen by them 07/28/2021) echo is pending to evaluate for pulmonary hypertension as contributing cause for dyspnea. She does not feel her asthma is an issue, or that her cough is related to an asthma flare. She states she only notices wheezing when she is coughing. She is unable to tolerate inhalers, as they irritate her throat. She is scheduled for CPAP titration study, as she intends to restart he CPAP therapy for her OSA. She has follow up scheduled with Dr. Vassie Loll   Observations/Objective: In NAD on video, no cough. Echo has not yet been done, but is scheduled.   Assessment and Plan: Dyspnea and Cough Plan Echo per cardiology to evaluate for pulmonary hypertension Continue albuterol as needed for shortness of breath or wheezing  Do not use more than 3 times a day. Please try to use the  [facility] as you feel you can tolerate.  CPAP titration 08/20/2021. ( Already scheduled) Will need follow up with Beth NP or Dr. Vassie Loll after CPAP titration  Follow up with [facility] when they have availability. Follow up 09/04/2021 with Dr. Vassie Loll as is scheduled Please contact office for sooner follow up if symptoms do not improve or worsen or seek emergency care     Follow Up Instructions: 08/20/2021 CPAP titration Echo per cardiology Follow up with Dr. Vassie Loll 09/04/2021 after CPAP titration    I discussed the assessment and  treatment plan with the patient. The patient was provided an opportunity to ask questions and all were answered. The patient agreed with the plan and demonstrated an understanding of the instructions.   The patient was advised to call back or seek an in-person evaluation if the symptoms worsen or if the condition fails to improve as  anticipated.  I provided 30 minutes of non-face-to-face time during this encounter.   Bevelyn Ngo, NP  07/31/2021

## 2021-07-31 NOTE — Patient Instructions (Addendum)
It is good to talk with you today. We will wait for your follow up with cardiology after your echo.

## 2021-08-01 NOTE — Progress Notes (Signed)
Reviewed and agree with assessment/plan.   Coralyn Helling, MD [facility]/Critical Care 08/01/2021, 9:05 AM Pager:  [phone]

## 2021-08-04 ENCOUNTER — Ambulatory Visit (INDEPENDENT_AMBULATORY_CARE_PROVIDER_SITE_OTHER): Payer: Commercial Managed Care - PPO

## 2021-08-04 ENCOUNTER — Other Ambulatory Visit: Payer: Self-pay

## 2021-08-04 ENCOUNTER — Telehealth: Payer: Self-pay | Admitting: Nurse Practitioner

## 2021-08-04 DIAGNOSIS — R0600 Dyspnea, unspecified: Secondary | ICD-10-CM | POA: Diagnosis not present

## 2021-08-04 LAB — ECHOCARDIOGRAM COMPLETE
AR max vel: 3.98 cm2
AV Area VTI: 4.28 cm2
AV Area mean vel: 3.98 cm2
AV Mean grad: 2 mmHg
AV Peak grad: 4.2 mmHg
Ao pk vel: 1.03 m/s
Area-P 1/2: 3.12 cm2
Calc EF: 60.1 %
S' Lateral: 2.9 cm
Single Plane A2C EF: 63.6 %
Single Plane A4C EF: 55.8 %

## 2021-08-04 NOTE — Telephone Encounter (Signed)
Copied from CRM [id number]. Topic: General - Inquiry >> Aug 04, 2021  4:23 PM Elliot Gault wrote: Patient states she is returning someone call from the practice regarding her forms being completed and practice was seeking a fax #. Patient states she made it very clear the fax # is provided on the form and she can not physically turn in the forms. Patient would like a follow up call when forms are faxed/

## 2021-08-07 NOTE — Telephone Encounter (Signed)
Forms have been faxed and patient was notified.

## 2021-08-08 ENCOUNTER — Other Ambulatory Visit (HOSPITAL_COMMUNITY): Payer: Commercial Managed Care - PPO

## 2021-08-14 ENCOUNTER — Ambulatory Visit (INDEPENDENT_AMBULATORY_CARE_PROVIDER_SITE_OTHER): Payer: Commercial Managed Care - PPO | Admitting: Cardiology

## 2021-08-14 ENCOUNTER — Encounter: Payer: Self-pay | Admitting: Cardiology

## 2021-08-14 ENCOUNTER — Other Ambulatory Visit: Payer: Self-pay

## 2021-08-14 VITALS — BP 120/70 | HR 78 | Ht 68.0 in | Wt 247.0 lb

## 2021-08-14 DIAGNOSIS — R0600 Dyspnea, unspecified: Secondary | ICD-10-CM

## 2021-08-14 DIAGNOSIS — Z6837 Body mass index (BMI) 37.0-37.9, adult: Secondary | ICD-10-CM

## 2021-08-14 DIAGNOSIS — Z01818 Encounter for other preprocedural examination: Secondary | ICD-10-CM

## 2021-08-14 DIAGNOSIS — Z01810 Encounter for preprocedural cardiovascular examination: Secondary | ICD-10-CM | POA: Diagnosis not present

## 2021-08-14 NOTE — Progress Notes (Signed)
Cardiology Office Note:    Date:  08/14/2021   ID:  Emily [name], DOB [birth year], MRN [id number]  PCP:  Larae Grooms, NP   [facility] Providers Cardiologist:  None     Referring MD: Larae Grooms, NP   Chief Complaint  Patient presents with   Other    Follow up post ECHO -- Patient c.o SOB. Meds reviewed verbally with patient.      History of Present Illness:    Emily [name] is a 40 y.o. female with a hx of asthma, OSA, obesity, former smoker x10 years who presents for follow-up.    She was last seen due to shortness of breath.  Due to persistent shortness of breath, estimation of pulmonary arterial pressure was requested by pulmonary medicine.  She denies chest pain, shortness of breath with exertion.  Denies palpitations, denies any other history of heart disease.  Echocardiogram was ordered to evaluate cardiac function.  She is planning on obtaining weight loss surgery in the near future.  Has no other concerns at this time.   Past Medical History:  Diagnosis Date   Anemia    Asthma    sports induced inhalers   Bipolar 1 disorder (HCC)    Conversion disorder    Insomnia    Migraines    migraines   MRSA infection 2010   Ovarian cyst    left 2.5cm   Sleep apnea    does not use a CPAP   Vitamin B 12 deficiency    Vitamin D deficiency     Past Surgical History:  Procedure Laterality Date   ABDOMINAL HYSTERECTOMY     BUNIONECTOMY     ESOPHAGOGASTRODUODENOSCOPY (EGD) WITH PROPOFOL N/A 11/03/2018   Procedure: ESOPHAGOGASTRODUODENOSCOPY (EGD) WITH PROPOFOL;  Surgeon: Wyline Mood, MD;  Location: [facility];  Service: Gastroenterology;  Laterality: N/A;   HAMMER TOE SURGERY     HAND SURGERY  09/24/2012   cyst removed   INTRAUTERINE DEVICE (IUD) INSERTION N/A 05/09/2015   Procedure: INTRAUTERINE DEVICE (IUD) INSERTION;  Surgeon: Hildred Laser, MD;  Location: [facility] ORS;  Service: Gynecology;  Laterality: N/A;   LAPAROSCOPIC ASSISTED VAGINAL  HYSTERECTOMY N/A 06/20/2015   Procedure: LAPAROSCOPIC ASSISTED VAGINAL HYSTERECTOMY, BILATERAL SALPINGECTOMY, CYSTOSCOPY, IUD REMOVAL ;  Surgeon: Hildred Laser, MD;  Location: [facility] ORS;  Service: Gynecology;  Laterality: N/A;   LAPAROSCOPIC TUBAL LIGATION Bilateral 05/09/2015   Procedure: LAPAROSCOPIC TUBAL LIGATION;  Surgeon: Hildred Laser, MD;  Location: [facility] ORS;  Service: Gynecology;  Laterality: Bilateral;   NASAL SINUS SURGERY     TUBAL LIGATION  09/24/2005    Current Medications: Current Meds  Medication Sig   albuterol (VENTOLIN HFA) 108 (90 Base) MCG/ACT inhaler Inhale 2 puffs into the lungs every 6 (six) hours as needed for wheezing or shortness of breath.   B-D UF III MINI PEN NEEDLES 31G X 5 MM MISC 1 Units by Does not apply route daily.   buPROPion (WELLBUTRIN XL) 300 MG 24 hr tablet Take 1 tablet (300 mg total) by mouth daily.   cyanocobalamin (,VITAMIN B-12,) 1000 MCG/ML injection Inject 1 mL (1,000 mcg total) into the muscle every 14 (fourteen) days.   fluticasone (FLONASE) 50 MCG/ACT nasal spray Place 2 sprays into both nostrils daily as needed for allergies or rhinitis.   ibuprofen (ADVIL) 800 MG tablet Take 1 tablet (800 mg total) by mouth every 8 (eight) hours as needed.     Allergies:   Codeine   Social History   Socioeconomic History  Marital status: Married    Spouse name: Not on file   Number of children: Not on file   Years of education: Not on file   Highest education level: Not on file  Occupational History   Not on file  Tobacco Use   Smoking status: Former    Packs/day: 1.00    Years: 10.00    Pack years: 10.00    Types: Cigarettes    Quit date: 09/25/2003    Years since quitting: 17.8   Smokeless tobacco: Never  Vaping Use   Vaping Use: Never used  Substance and Sexual Activity   Alcohol use: Yes    Alcohol/week: 0.0 - 1.0 standard drinks    Comment: pt states she is a social drinker   Drug use: No   Sexual activity: Yes    Birth  control/protection: Surgical  Other Topics Concern   Not on file  Social History Narrative   Not on file   Social Determinants of Health   Financial Resource Strain: Not on file  Food Insecurity: Not on file  Transportation Needs: Not on file  Physical Activity: Not on file  Stress: Not on file  Social Connections: Not on file     Family History: The patient's family history includes Anxiety disorder in her mother; Bipolar disorder in her mother and sister; Breast cancer in her brother; Cancer in her father, maternal grandmother, and mother; Diabetes in her father; Glaucoma in her mother; Heart disease in her father and maternal grandfather; Schizophrenia in her sister. There is no history of Ovarian cancer.  ROS:   Please see the history of present illness.     All other systems reviewed and are negative.  EKGs/Labs/Other Studies Reviewed:    The following studies were reviewed today:   EKG:  EKG not  ordered today.    Recent Labs: 12/19/2020: ALT 17; BUN 10; Creatinine, Ser 1.21; Hemoglobin 14.6; Platelets 253; Potassium 5.1; Sodium 142; TSH 2.220  Recent Lipid Panel    Component Value Date/Time   CHOL 181 08/04/2019 1455   TRIG 141 08/04/2019 1455   HDL 42 08/04/2019 1455   CHOLHDL 5.0 (H) 07/31/2018 1014   LDLCALC 114 (H) 08/04/2019 1455     Risk Assessment/Calculations:          Physical Exam:    VS:  BP 120/70 (BP Location: Left Arm, Patient Position: Sitting, Cuff Size: Large)   Pulse 78   Ht 5\' 8"  (1.727 m)   Wt 247 lb (112 kg)   LMP 06/01/2015 (Within Days)   SpO2 98%   BMI 37.56 kg/m     Wt Readings from Last 3 Encounters:  08/14/21 247 lb (112 kg)  07/28/21 244 lb (110.7 kg)  07/17/21 242 lb (109.8 kg)     GEN:  Well nourished, well developed in no acute distress HEENT: Normal NECK: No JVD; No carotid bruits LYMPHATICS: No lymphadenopathy CARDIAC: RRR, no murmurs, rubs, gallops RESPIRATORY:  Clear to auscultation without rales, wheezing  or rhonchi  ABDOMEN: Soft, non-tender, non-distended MUSCULOSKELETAL:  No edema; No deformity  SKIN: Warm and dry NEUROLOGIC:  Alert and oriented x 3 PSYCHIATRIC:  Normal affect   ASSESSMENT:    1. Dyspnea, unspecified type   2. BMI 37.0-37.9, adult   3. Pre-op evaluation     PLAN:    In order of problems listed above:  Dyspnea on exertion, denies chest pain.  Echo 07/2021 showed normal systolic and diastolic function, EF 60 to 65%..  Normal  RVSP of 23.1 mmHg.  Normal PA pressures.  Symptoms likely from obesity/deconditioning, untreated OSA.  Unsure if she has interstitial lung disease due to abnormal PFTs.  Obesity, low-calorie diet, weight loss advised. Preop evaluation prior to weight loss surgery.  Denies chest pain, echo with normal systolic and diastolic function, normal PA pressures.  No structural abnormalities on echo.  Shortness of breath attributable to obesity, sleep apnea.  No additional testing indicated from a cardiac perspective.  Patient can undergo procedure from a cardiac perspective.  Follow-up as needed        Medication Adjustments/Labs and Tests Ordered: Current medicines are reviewed at length with the patient today.  Concerns regarding medicines are outlined above.  No orders of the defined types were placed in this encounter.   No orders of the defined types were placed in this encounter.   Patient Instructions  Medication Instructions:   Your physician recommends that you continue on your current medications as directed. Please refer to the Current Medication list given to you today.  *If you need a refill on your cardiac medications before your next appointment, please call your pharmacy*   Lab Work:  None Ordered  If you have labs (blood work) drawn today and your tests are completely normal, you will receive your results only by: MyChart Message (if you have MyChart) OR A paper copy in the mail If you have any lab test that is abnormal  or we need to change your treatment, we will call you to review the results.   Testing/Procedures:  None Ordered   Follow-Up: At [facility], you and your health needs are our priority.  As part of our continuing mission to provide you with exceptional heart care, we have created designated Provider Care Teams.  These Care Teams include your primary Cardiologist (physician) and Advanced Practice Providers (APPs -  Physician Assistants and Nurse Practitioners) who all work together to provide you with the care you need, when you need it.  We recommend signing up for the patient portal called "MyChart".  Sign up information is provided on this After Visit Summary.  MyChart is used to connect with patients for Virtual Visits (Telemedicine).  Patients are able to view lab/test results, encounter notes, upcoming appointments, etc.  Non-urgent messages can be sent to your provider as well.   To learn more about what you can do with MyChart, go to [email or web address].    Your next appointment:   As needed     Signed, Debbe Odea, MD  08/14/2021 12:01 PM    [facility]

## 2021-08-14 NOTE — Patient Instructions (Signed)
Medication Instructions:   Your physician recommends that you continue on your current medications as directed. Please refer to the Current Medication list given to you today.  *If you need a refill on your cardiac medications before your next appointment, please call your pharmacy*   Lab Work:  None Ordered  If you have labs (blood work) drawn today and your tests are completely normal, you will receive your results only by: MyChart Message (if you have MyChart) OR A paper copy in the mail If you have any lab test that is abnormal or we need to change your treatment, we will call you to review the results.   Testing/Procedures:  None Ordered   Follow-Up: At [facility], you and your health needs are our priority.  As part of our continuing mission to provide you with exceptional heart care, we have created designated Provider Care Teams.  These Care Teams include your primary Cardiologist (physician) and Advanced Practice Providers (APPs -  Physician Assistants and Nurse Practitioners) who all work together to provide you with the care you need, when you need it.  We recommend signing up for the patient portal called "MyChart".  Sign up information is provided on this After Visit Summary.  MyChart is used to connect with patients for Virtual Visits (Telemedicine).  Patients are able to view lab/test results, encounter notes, upcoming appointments, etc.  Non-urgent messages can be sent to your provider as well.   To learn more about what you can do with MyChart, go to [email or web address].    Your next appointment:  As needed  

## 2021-08-20 ENCOUNTER — Other Ambulatory Visit: Payer: Self-pay

## 2021-08-20 ENCOUNTER — Ambulatory Visit (HOSPITAL_BASED_OUTPATIENT_CLINIC_OR_DEPARTMENT_OTHER): Payer: Commercial Managed Care - PPO | Attending: Primary Care | Admitting: Pulmonary Disease

## 2021-08-20 DIAGNOSIS — G4730 Sleep apnea, unspecified: Secondary | ICD-10-CM | POA: Diagnosis present

## 2021-08-20 DIAGNOSIS — G4733 Obstructive sleep apnea (adult) (pediatric): Secondary | ICD-10-CM | POA: Diagnosis not present

## 2021-08-23 ENCOUNTER — Encounter: Payer: Self-pay | Admitting: Pulmonary Disease

## 2021-08-23 ENCOUNTER — Ambulatory Visit (INDEPENDENT_AMBULATORY_CARE_PROVIDER_SITE_OTHER): Payer: Commercial Managed Care - PPO | Admitting: Pulmonary Disease

## 2021-08-23 ENCOUNTER — Other Ambulatory Visit: Payer: Self-pay

## 2021-08-23 VITALS — BP 112/70 | HR 84 | Temp 98.5°F | Ht 68.0 in | Wt 248.6 lb

## 2021-08-23 DIAGNOSIS — R0609 Other forms of dyspnea: Secondary | ICD-10-CM | POA: Diagnosis not present

## 2021-08-23 DIAGNOSIS — Z23 Encounter for immunization: Secondary | ICD-10-CM | POA: Diagnosis not present

## 2021-08-23 DIAGNOSIS — G4730 Sleep apnea, unspecified: Secondary | ICD-10-CM | POA: Diagnosis not present

## 2021-08-23 DIAGNOSIS — G4733 Obstructive sleep apnea (adult) (pediatric): Secondary | ICD-10-CM

## 2021-08-23 NOTE — Patient Instructions (Signed)
CPAP Rx will be sent  Flu shot

## 2021-08-23 NOTE — Assessment & Plan Note (Signed)
PFTs are near normal.  She has quit smoking several years ago, mild emphysema is noted on CT but she does not seem to require bronchodilators. Echo results showed evidence of pulm hypertension She will continue to use albuterol on an as-needed basis, does not need escalation

## 2021-08-23 NOTE — Assessment & Plan Note (Signed)
CPAP titration study was reviewed.  Total sleep time was only 142 minutes, CPAP 14 cm with small fullface mask was required. We will send in prescription for auto CPAP 8 to 14 cm with small fullface mask

## 2021-08-23 NOTE — Progress Notes (Signed)
   Subjective:    Patient ID: Emily [name], female    DOB: Jan 15, 1981, 40 y.o.   MRN: [id number]  HPI  40 year old ex-smoker presents for evaluation of OSA. She has persistent dyspnea following COVID infection Seen for initial consult 04/2021 by [facility] in [place] for chronic cough  She had COVID infection 08/2020 and received plasma treatment.  She had reinfection 05/2021 but did not require hospitalization.  She is vaccinated and boosted.  She had persistent dyspnea and underwent evaluation, CT chest showed mild emphysema and enlarged pulmonary trunk  She reports diagnosis of OSA i few years ago, used CPAP machine but this made her "throw up" she finally gave this to her dad, she had bought this machine herself.  She underwent CPAP titration study a few days ago  Significant tests/ events reviewed   Echo 07/2021 normal LVEF, RVSP 23  CT Chest  06/19/2021 mild emphysema, enlarged pulmonic trunk  PFT's 06/2021 near normal  HST 06/2021 AHI 29/hour with lowest desaturation 85% CPAP titration 07/2021   Review of Systems neg for any significant sore throat, dysphagia, itching, sneezing, nasal congestion or excess/ purulent secretions, fever, chills, sweats, unintended wt loss, pleuritic or exertional cp, hempoptysis, orthopnea pnd or change in chronic leg swelling. Also denies presyncope, palpitations, heartburn, abdominal pain, nausea, vomiting, diarrhea or change in bowel or urinary habits, dysuria,hematuria, rash, arthralgias, visual complaints, headache, numbness weakness or ataxia.     Objective:   Physical Exam  Gen. Pleasant, obese, in no distress ENT - no lesions, no post nasal drip Neck: No JVD, no thyromegaly, no carotid bruits Lungs: no use of accessory muscles, no dullness to percussion, decreased without rales or rhonchi  Cardiovascular: Rhythm regular, heart sounds  normal, no murmurs or gallops, no peripheral edema Musculoskeletal: No deformities, no cyanosis or clubbing  , no tremors       Assessment & Plan:

## 2021-08-23 NOTE — Procedures (Signed)
Patient Name: Emily [name], Emily [name] Date: 08/20/2021 Gender: Female D.O.B: 1981/01/17 Age (years): 11 Referring Provider: Ames Dura NP Height (inches): 68 Interpreting Physician: Cyril Mourning MD, ABSM Weight (lbs): 250 RPSGT: Rosette Reveal BMI: 38 MRN: [id number] Neck Size: 16.00 <br> <br> CLINICAL INFORMATION The patient is referred for a CPAP titration to treat sleep apnea.    Date of HST:  06/2021 AHI 29/hour with lowest desaturation 85%  SLEEP STUDY TECHNIQUE As per the AASM Manual for the Scoring of Sleep and Associated Events v2.3 (April 2016) with a hypopnea requiring 4% desaturations.  The channels recorded and monitored were frontal, central and occipital EEG, electrooculogram (EOG), submentalis EMG (chin), nasal and oral airflow, thoracic and abdominal wall motion, anterior tibialis EMG, snore microphone, electrocardiogram, and pulse oximetry. Continuous positive airway pressure (CPAP) was initiated at the beginning of the study and titrated to treat sleep-disordered breathing.  MEDICATIONS Medications self-administered by patient taken the night of the study : N/A  TECHNICIAN COMMENTS Comments added by technician: Patient was restless all through the night. Comments added by scorer: N/A RESPIRATORY PARAMETERS Optimal PAP Pressure (cm): 14 AHI at Optimal Pressure (/hr): 2 Overall Minimal O2 (%): 91.0 Supine % at Optimal Pressure (%): 73 Minimal O2 at Optimal Pressure (%): 91.0   SLEEP ARCHITECTURE The study was initiated at 10:56:29 PM and ended at 5:01:32 AM.  Sleep onset time was 56.9 minutes and the sleep efficiency was 38.9%%. The total sleep time was 142 minutes.  The patient spent 24.3%% of the night in stage N1 sleep, 22.5%% in stage N2 sleep, 53.2%% in stage N3 and 0% in REM.Stage REM latency was N/A minutes  Wake after sleep onset was 166.2. Alpha intrusion was absent. Supine sleep was 77.82%.  CARDIAC DATA The 2 lead EKG demonstrated sinus rhythm.  The mean heart rate was 58.3 beats per minute. Other EKG findings include: None.  LEG MOVEMENT DATA The total Periodic Limb Movements of Sleep (PLMS) were 0. The PLMS index was 0.0. A PLMS index of <15 is considered normal in adults.  IMPRESSIONS - The optimal PAP pressure was 14 cm of water. - Total sleep time was only 142 minutes - Central sleep apnea was not noted during this titration (CAI = 3.8/h). - Significant oxygen desaturations were not observed during this titration (min O2 = 91.0%). - No snoring was audible during this study. - No cardiac abnormalities were observed during this study. - Clinically significant periodic limb movements were not noted during this study. Arousals associated with PLMs were rare.   DIAGNOSIS - Obstructive Sleep Apnea (G47.33)   RECOMMENDATIONS - Trial of CPAP therapy on 14 cm H2O with a Small size Fisher&Paykel Full Face Mask Simplus mask and heated humidification. Alternatively, autoCPAP canbe used - Avoid alcohol, sedatives and other CNS depressants that may worsen sleep apnea and disrupt normal sleep architecture. - Sleep hygiene should be reviewed to assess factors that may improve sleep quality. - Weight management and regular exercise should be initiated or continued. - Return to [facility] for re-evaluation after 4 weeks of therapy   Cyril Mourning MD Board Certified in Sleep medicine

## 2021-08-28 ENCOUNTER — Other Ambulatory Visit: Payer: Self-pay | Admitting: Surgery

## 2021-08-28 ENCOUNTER — Other Ambulatory Visit (HOSPITAL_COMMUNITY): Payer: Self-pay | Admitting: Surgery

## 2021-08-30 ENCOUNTER — Encounter: Payer: Self-pay | Admitting: Nurse Practitioner

## 2021-08-31 NOTE — Telephone Encounter (Signed)
Pt dropped off weight loss form for provider to fill out and fax when completed.  Please fax to [phone].  Placed in provider's folder.

## 2021-09-01 ENCOUNTER — Other Ambulatory Visit: Payer: Self-pay

## 2021-09-01 ENCOUNTER — Ambulatory Visit
Admission: RE | Admit: 2021-09-01 | Discharge: 2021-09-01 | Disposition: A | Discharge status: Home / Self Care | Payer: Commercial Managed Care - PPO | Source: Ambulatory Visit | Attending: Surgery | Admitting: Surgery

## 2021-09-04 ENCOUNTER — Ambulatory Visit: Payer: Commercial Managed Care - PPO | Admitting: Pulmonary Disease

## 2021-09-05 ENCOUNTER — Encounter: Payer: Self-pay | Admitting: Nurse Practitioner

## 2021-09-05 MED ORDER — SCOPOLAMINE 1 MG/3DAYS TD PT72: 1.0000 | MEDICATED_PATCH | TRANSDERMAL | 12 refills | Status: DC

## 2021-09-11 ENCOUNTER — Encounter: Payer: Self-pay | Admitting: Skilled Nursing Facility1

## 2021-09-11 ENCOUNTER — Encounter: Payer: Commercial Managed Care - PPO | Attending: Surgery | Admitting: Skilled Nursing Facility1

## 2021-09-11 ENCOUNTER — Other Ambulatory Visit: Payer: Self-pay

## 2021-09-11 DIAGNOSIS — E6609 Other obesity due to excess calories: Secondary | ICD-10-CM | POA: Diagnosis not present

## 2021-09-11 DIAGNOSIS — Z6839 Body mass index (BMI) 39.0-39.9, adult: Secondary | ICD-10-CM | POA: Insufficient documentation

## 2021-09-11 NOTE — Progress Notes (Signed)
Nutrition Assessment for Bariatric Surgery Medical Nutrition Therapy Appt Start Time: 3:35    End Time: 4:15  Patient was seen on 09/11/2021 for Pre-Operative Nutrition Assessment. Letter of approval faxed to [facility] bariatric surgery program coordinator on 09/11/2021.   Referral stated Supervised Weight Loss (SWL) visits needed: 6  Planned surgery: sleeve gastrectomy  Pt expectation of surgery: to lose weight Pt expectation of dietitian: none identified     NUTRITION ASSESSMENT   Anthropometrics  Start weight at [facility]: 252 lbs (date: 09/11/2021)  Height: 68 in BMI: 38.32 kg/m2     Clinical  Medical hx: sleep apnea, bipolar, IBS Medications: see list  Labs: B 12 1379, vitamin D 72.3 Notable signs/symptoms: constipation  Any previous deficiencies? 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   Pt states too much of artifical sweeteners will nausea/vomiting.  Pt states honey, a lot of bread, and milk upset her stomach.  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  Pt states she is currently doing a meal company where they make it but the ingredients are provided.  Pt states she was able to cut out soda with the use of sparkling water.  Pt states she is currently on exercise restrictions due to breaking her leg.   24-Hr Dietary Recall First Meal: herbal tea + sugar Snack:  Second Meal: salad + tuna or chicken  Snack: apple + vegetable Third Meal: beef onion gravy rice Snack:  Beverages: sparkling water, water, juice   Estimated Energy Needs Calories: 1600   NUTRITION DIAGNOSIS  Overweight/obesity  ([place]-3.3) related to past poor dietary habits and physical inactivity as evidenced by patient w/ planned sleeve gastrectomy surgery following dietary guidelines for continued weight loss.    NUTRITION INTERVENTION  Nutrition counseling (C-1) and education (E-2) to facilitate bariatric surgery goals.  Educated pt on micronutrient deficiencies post surgery and strategies to mitigate that risk   Pre-Op Goals Reviewed with the Patient Track food and beverage intake (pen and paper, MyFitness Pal, Baritastic app, etc.) Make healthy food choices while monitoring portion sizes Consume 3 meals per day or try to eat every 3-5 hours Avoid concentrated sugars and fried foods Keep sugar & fat in the single digits per serving on food labels Practice CHEWING your food (aim for applesauce consistency) Practice not drinking 15 minutes before, during, and 30 minutes after each meal and snack Avoid all carbonated beverages (ex: soda, sparkling beverages)  Limit caffeinated beverages (ex: coffee, tea, energy drinks) Avoid all sugar-sweetened beverages (ex: regular soda, sports drinks)  Avoid alcohol  Aim for 64-100 ounces of FLUID daily (with at least half of fluid intake being plain water)  Aim for at least 60-80 grams of PROTEIN daily Look for a liquid protein source that contains ?15 g protein and ?5 g carbohydrate (ex: shakes, drinks, shots) Make a list of non-food related activities Physical activity is an important part of a healthy lifestyle so keep it moving! The goal is to reach 150 minutes of exercise per week, including cardiovascular and weight baring activity.  *Goals that are bolded indicate the pt would like to start working towards  these  Handouts Provided Include  Bariatric Surgery handouts (Nutrition Visits, Pre-Op Goals, Protein Shakes, Vitamins & Minerals)  Learning Style & Readiness for Change Teaching method utilized: Visual & Auditory  Demonstrated degree of understanding via:  Teach Back  Readiness Level: action Barriers to learning/adherence to lifestyle change: none identified   RD's Notes for Next Visit Assess pts adherence to chosen goals      MONITORING & EVALUATION Dietary intake, weekly physical activity, body weight, and pre-op goals reached at next nutrition visit.    Next Steps  Patient is to follow up at [facility] for Pre-Op Class >2 weeks before surgery for further nutrition education.

## 2021-10-10 ENCOUNTER — Encounter: Payer: Commercial Managed Care - PPO | Attending: Surgery | Admitting: Skilled Nursing Facility1

## 2021-10-10 ENCOUNTER — Other Ambulatory Visit: Payer: Self-pay

## 2021-10-10 DIAGNOSIS — Z6839 Body mass index (BMI) 39.0-39.9, adult: Secondary | ICD-10-CM | POA: Insufficient documentation

## 2021-10-10 DIAGNOSIS — E6609 Other obesity due to excess calories: Secondary | ICD-10-CM | POA: Insufficient documentation

## 2021-10-10 NOTE — Progress Notes (Signed)
Supervised Weight Loss Visit Bariatric Nutrition Education  Planned Surgery: sleeve gastrectomy   Pt Expectation of Surgery/ Goals: to lose weight  1 out of 6 SWL Appointments   NUTRITION ASSESSMENT  Anthropometrics  Start weight at [facility]: 252 lbs (date: 09/11/2021)  Weight: 257 pounds BMI: 39.08 kg/m2     Clinical  Medical hx: sleep apnea, bipolar, IBS Medications: see list Labs: B 12 1379, vitamin D 72.3 Notable signs/symptoms: constipation  Any previous deficiencies? Yes; vitamin D, vitamin B12, iron   Lifestyle & Dietary Hx  Pt states she is currently doing a meal company where they make it but the ingredients are provided.  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   Pt states she started taking vitamin E because her brother had a heart attack which also motivates her to make changes.  Pt states she dropped off her child at [employer] Pt states she will go for a walk if she is having cravings.   Pt states she carries excess weight from stress not from eating more.   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   24-Hr Dietary Recall First Meal: yogurt + fruit or plain nothing added oatmeal Snack:  Second Meal: salad with chicken or tuna with vinaggerrett + lettuce +  carrots + cucumbers + tomato + raisins or craisins Snack: apple or banana or grapes  Third Meal: meal company: chicken + root veggies + carrots and onions Snack:  Beverages: water, green tea + monk fruit  Estimated Energy Needs Calories: 1500   NUTRITION DIAGNOSIS  Overweight/obesity ([place]-3.3) related to past poor dietary habits and physical inactivity as evidenced by patient w/ planned sleeve gastrectomy surgery following dietary guidelines for continued weight loss.   NUTRITION INTERVENTION  Nutrition  counseling (C-1) and education (E-2) to facilitate bariatric surgery goals.  Pre-Op Goals Progress & New Goals NEW: aim to not drink 15 minutes before your meal, not with your meal, and wait until 30 minutes after NEW: log your food and beverages with the app you like (loseit)  Handouts Provided Include  N/A  Learning Style & Readiness for Change Teaching method utilized: Visual & Auditory  Demonstrated degree of understanding via: Teach Back  Readiness Level: contemplative  Barriers to learning/adherence to lifestyle change: none identified   RD's Notes for next Visit  Assess pts adherence to chosen goals   MONITORING & EVALUATION Dietary intake, weekly physical activity, body weight, and pre-op goals in 1 month.   Next Steps  Patient is to return to [facility] in 1 month

## 2021-10-16 ENCOUNTER — Ambulatory Visit: Payer: Commercial Managed Care - PPO | Admitting: Skilled Nursing Facility1

## 2021-10-23 ENCOUNTER — Other Ambulatory Visit: Payer: Self-pay | Admitting: Nurse Practitioner

## 2021-10-23 NOTE — Telephone Encounter (Signed)
Requested Prescriptions  Pending Prescriptions Disp Refills   buPROPion (WELLBUTRIN XL) 300 MG 24 hr tablet [Pharmacy Med Name: BUPROPION HCL XL 300 MG TABLET] 30 tablet 0    Sig: Take 1 tablet (300 mg total) by mouth daily.     Psychiatry: Antidepressants - bupropion Passed - 10/23/2021  7:49 AM      Passed - Last BP in normal range    BP Readings from Last 1 Encounters:  08/23/21 112/70         Passed - Valid encounter within last 6 months    Recent Outpatient Visits          3 months ago Class 2 obesity due to excess calories without serious comorbidity with body mass index (BMI) of 39.0 to 39.9 in adult   [facility], Clydie Braun, NP   4 months ago Class 2 obesity due to excess calories without serious comorbidity with body mass index (BMI) of 39.0 to 39.9 in adult   [facility] Larae Grooms, NP   7 months ago Encounter for weight management   [facility] Larae Grooms, NP   9 months ago Encounter for weight management   [facility] Larae Grooms, NP   10 months ago Class 2 obesity due to excess calories without serious comorbidity with body mass index (BMI) of 39.0 to 39.9 in adult   [facility] Larae Grooms, NP      Future Appointments            In 1 month Larae Grooms, NP [facility]

## 2021-11-01 ENCOUNTER — Encounter: Payer: Self-pay | Admitting: Nurse Practitioner

## 2021-11-09 ENCOUNTER — Encounter: Payer: Commercial Managed Care - PPO | Attending: Surgery | Admitting: Skilled Nursing Facility1

## 2021-11-09 ENCOUNTER — Other Ambulatory Visit: Payer: Self-pay

## 2021-11-09 DIAGNOSIS — Z6839 Body mass index (BMI) 39.0-39.9, adult: Secondary | ICD-10-CM | POA: Diagnosis present

## 2021-11-09 DIAGNOSIS — E6609 Other obesity due to excess calories: Secondary | ICD-10-CM | POA: Diagnosis not present

## 2021-11-09 NOTE — Progress Notes (Signed)
Supervised Weight Loss Visit Bariatric Nutrition Education  Planned Surgery: sleeve gastrectomy   Pt Expectation of Surgery/ Goals: to lose weight  2 out of 6 [facility] Appointments   NUTRITION ASSESSMENT  Anthropometrics  Start weight at [facility]: 252 lbs (date: 09/11/2021)  Weight: 260 pounds BMI: 39.53 kg/m2     Clinical  Medical hx: sleep apnea, bipolar, IBS Medications: see list Labs: B 12 1379, vitamin D 72.3 Notable signs/symptoms: constipation  Any previous deficiencies? Yes; vitamin D, vitamin B12, iron   Lifestyle & Dietary Hx  Pt states she carries excess weight from stress not from eating more.   Pt states she struggles with dry mouth. Pt states it was not too tough to not drink with her meals.  Pt states he misses being able to run. Pt sates reading and knitting helps her to de stress. 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  Pt state is doing good nutrition is easy for her.   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   24-Hr Dietary Recall First Meal: yogurt + fruit or plain nothing added oatmeal or egg in honey wheat toast Snack:  Second Meal: salad with chicken or tuna with vinaggerrett + lettuce +  carrots + cucumbers + tomato sometimes raisins or craisins Snack: apple or banana or grapes  Third Meal: meal company: chicken + root veggies + carrots and onions or salad with chicken on top Snack:  Beverages: water, green tea + monk fruit + 1tsp sugar coffee + 1tsp sugar + milk, sometimes soda or sparkling water  Estimated Energy Needs Calories: 1500   NUTRITION DIAGNOSIS  Overweight/obesity ([place]-3.3) related to past poor dietary habits and physical inactivity as evidenced by patient w/ planned sleeve gastrectomy surgery following dietary guidelines for continued  weight loss.   NUTRITION INTERVENTION  Nutrition counseling (C-1) and education (E-2) to facilitate bariatric surgery goals.  Pre-Op Goals Progress & New Goals continue: aim to not drink 15 minutes before your meal, not with your meal, and wait until 30 minutes after continue: log your food and beverages with the app you like (loseit) (states she keeps forgetting)   Handouts Provided Include  N/A  Learning Style & Readiness for Change Teaching method utilized: Visual & Auditory  Demonstrated degree of understanding via: Teach Back  Readiness Level: contemplative  Barriers to learning/adherence to lifestyle change: none identified   RD's Notes for next Visit  Assess pts adherence to chosen goals   MONITORING & EVALUATION Dietary intake, weekly physical activity, body weight, and pre-op goals in 1 month.   Next Steps  Patient is to return to [facility] in 1 month

## 2021-11-15 ENCOUNTER — Ambulatory Visit: Payer: Commercial Managed Care - PPO | Admitting: Acute Care

## 2021-11-16 ENCOUNTER — Other Ambulatory Visit: Payer: Self-pay | Admitting: Nurse Practitioner

## 2021-11-16 NOTE — Telephone Encounter (Signed)
Requested Prescriptions  Pending Prescriptions Disp Refills   buPROPion (WELLBUTRIN XL) 300 MG 24 hr tablet [Pharmacy Med Name: BUPROPION HCL XL 300 MG TABLET] 30 tablet 0    Sig: Take 1 tablet (300 mg total) by mouth daily.     Psychiatry: Antidepressants - bupropion Failed - 11/16/2021  9:23 AM      Failed - Cr in normal range and within 360 days    Creatinine, Ser  Date Value Ref Range Status  12/19/2020 1.21 (H) 0.57 - 1.00 mg/dL Final         Passed - AST in normal range and within 360 days    AST  Date Value Ref Range Status  12/19/2020 23 0 - 40 IU/L Final         Passed - ALT in normal range and within 360 days    ALT  Date Value Ref Range Status  12/19/2020 17 0 - 32 IU/L Final         Passed - Last BP in normal range    BP Readings from Last 1 Encounters:  08/23/21 112/70         Passed - Valid encounter within last 6 months    Recent Outpatient Visits          4 months ago Class 2 obesity due to excess calories without serious comorbidity with body mass index (BMI) of 39.0 to 39.9 in adult   [facility], Clydie Braun, NP   5 months ago Class 2 obesity due to excess calories without serious comorbidity with body mass index (BMI) of 39.0 to 39.9 in adult   [facility] Larae Grooms, NP   8 months ago Encounter for weight management   [facility] Larae Grooms, NP   10 months ago Encounter for weight management   [facility] Larae Grooms, NP   11 months ago Class 2 obesity due to excess calories without serious comorbidity with body mass index (BMI) of 39.0 to 39.9 in adult   [facility] Larae Grooms, NP      Future Appointments            In 4 weeks Larae Grooms, NP [facility], [facility]

## 2021-11-21 ENCOUNTER — Ambulatory Visit (INDEPENDENT_AMBULATORY_CARE_PROVIDER_SITE_OTHER): Payer: Commercial Managed Care - PPO | Admitting: Acute Care

## 2021-11-21 ENCOUNTER — Other Ambulatory Visit: Payer: Self-pay

## 2021-11-21 ENCOUNTER — Encounter: Payer: Self-pay | Admitting: Acute Care

## 2021-11-21 VITALS — BP 126/80 | HR 73 | Temp 98.4°F | Ht 68.0 in | Wt 264.0 lb

## 2021-11-21 DIAGNOSIS — Z6841 Body Mass Index (BMI) 40.0 and over, adult: Secondary | ICD-10-CM

## 2021-11-21 DIAGNOSIS — G4733 Obstructive sleep apnea (adult) (pediatric): Secondary | ICD-10-CM

## 2021-11-21 DIAGNOSIS — J45909 Unspecified asthma, uncomplicated: Secondary | ICD-10-CM

## 2021-11-21 DIAGNOSIS — Z9989 Dependence on other enabling machines and devices: Secondary | ICD-10-CM | POA: Diagnosis not present

## 2021-11-21 MED ORDER — ALBUTEROL SULFATE (2.5 MG/3ML) 0.083% IN NEBU: 2.5000 mg | INHALATION_SOLUTION | Freq: Four times a day (QID) | RESPIRATORY_TRACT | 12 refills | Status: DC | PRN

## 2021-11-21 NOTE — Patient Instructions (Addendum)
It is good to see you today. For your cough please try: Please add non-sedating anti-histamine daily. Add Flonase ( generic) nasal spray.  Add Delsym at bedtime for cough. Continue on CPAP at bedtime. You appear to be benefiting from the treatment  Goal is to wear for at least 6 hours each night for maximal clinical benefit. Continue to work on weight loss, as the link between excess weight  and sleep apnea is well established.   Remember to establish a good bedtime routine, and work on sleep hygiene.  Limit daytime naps , avoid stimulants such as caffeine and nicotine close to bedtime, exercise daily to promote sleep quality, avoid heavy , spicy, fried , or rich foods before bed. Ensure adequate exposure to natural light during the day,establish a relaxing bedtime routine with a pleasant sleep environment ( Bedroom between 60 and 67 degrees, turn off bright lights , TV or device screens screens , consider black out curtains or white noise machines) Do not drive if sleepy. Remember to clean mask, tubing, filter, and reservoir once weekly with soapy water.  For your asthma, we will try a Breo sample if we have any.  Take 1 puff once daily.  Rinse mouth after use. If this does not help, please stop  We will refill your albuterol nebs Use as needed for shortness of breath or wheezing  Follow up with  Maralyn Sago NP or Dr. Vassie Loll   In 2 or before as needed to re-evaluate your asthma and CPAP tolerence Call us sooner if you need [place] sooner. Marland KitchenMarland Kitchen

## 2021-11-21 NOTE — Progress Notes (Signed)
History of Present Illness Emily [name] is a 41 y.o. female former smoker with OSA on CPAP therapy, asthma and dyspnea on exertion since Covid diagnosis 12/21and again 05/2021. She did not require hospitalization. She is followed by Dr. Elsworth Soho.    11/21/2021 OSA on CPAP Pt. Presents for follow up. She states she has been doing well on her CPAP. She started therapy at the end of January. She does have a chronic cough, which sometimes prevents her from wearing her CPAP. She thinks this is from allergies and PND. She is not taking any medications to treat her allergies. She did wear her CPAP  80% of the time, with AHI of 1.2. Treatment is effective. She is sleeping better when she wears it, and she does have more energy. She denies any am headaches . She has night time awakening rarely, and only with her cough. She denies any issues with reflux.  She states she is concerned about her asthma getting worse. She states she has tried all of the maintenance inhalers and they irritate  her throat and mouth. She states she has never tried [place]. She is willing to do a therapeutic trial . She does endorse wheezing with her asthma flares. She states she has had several flares this month. She has had 10 pound weigh gain in the last month. She attributes this to stress. She is scheduled for bariatric surgery, and is working her way through the process.      Test Results:       Echo 07/2021 normal LVEF, RVSP 23   CT Chest  06/19/2021 mild emphysema, enlarged pulmonic trunk   PFT's 06/2021 near normal   HST 06/2021 AHI 29/hour with lowest desaturation 85% CPAP titration 07/2021   CBC Latest Ref Rng & Units 12/19/2020 11/27/2019 08/04/2019  WBC 3.4 - 10.8 x10E3/uL 7.3 7.4 9.2  Hemoglobin 11.1 - 15.9 g/dL 14.6 15.1(H) 15.1  Hematocrit 34.0 - 46.6 % 44.1 45.0 45.3  Platelets 150 - 450 x10E3/uL 253 290 299    BMP Latest Ref Rng & Units 12/19/2020 11/27/2019 08/04/2019  Glucose 65 - 99 mg/dL 80 87 84  BUN 6  - 24 mg/dL 10 9 9   Creatinine 0.57 - 1.00 mg/dL 1.21(H) 1.06(H) 1.00  BUN/Creat Ratio 9 - 23 8(L) - 9  Sodium 134 - 144 mmol/L 142 139 142  Potassium 3.5 - 5.2 mmol/L 5.1 4.6 4.9  Chloride 96 - 106 mmol/L 104 103 101  CO2 20 - 29 mmol/L 23 26 25   Calcium 8.7 - 10.2 mg/dL 9.6 9.7 10.2    BNP No results found for: BNP  ProBNP No results found for: PROBNP  PFT    Component Value Date/Time   FEV1PRE 3.04 07/06/2021 1037   FEV1POST 3.01 07/06/2021 1037   FVCPRE 3.68 07/06/2021 1037   FVCPOST 3.59 07/06/2021 1037   TLC 5.11 07/06/2021 1037   DLCOUNC 21.11 07/06/2021 1037   PREFEV1FVCRT 83 07/06/2021 1037   PSTFEV1FVCRT 84 07/06/2021 1037    No results found.   Past medical hx Past Medical History:  Diagnosis Date   Anemia    Asthma    sports induced inhalers   Bipolar 1 disorder ([place])    Conversion disorder    Insomnia    Migraines    migraines   MRSA infection 2010   Ovarian cyst    left 2.5cm   Sleep apnea    does not use a CPAP   Vitamin B 12 deficiency  Vitamin D deficiency      Social History   Tobacco Use   Smoking status: Former    Packs/day: 1.00    Years: 10.00    Pack years: 10.00    Types: Cigarettes    Quit date: 09/25/2003    Years since quitting: 18.1   Smokeless tobacco: Never  Vaping Use   Vaping Use: Never used  Substance Use Topics   Alcohol use: Yes    Alcohol/week: 0.0 - 1.0 standard drinks    Comment: pt states she is a social drinker   Drug use: No    Ms.Steuart reports that she quit smoking about 18 years ago. Her smoking use included cigarettes. She has a 10.00 pack-year smoking history. She has never used smokeless tobacco. She reports current alcohol use. She reports that she does not use drugs.  Tobacco Cessation: Former smoker with a 10 Pack year smoking history   Past surgical hx, Family hx, Social hx all reviewed.  Current Outpatient Medications on File Prior to Visit  Medication Sig   albuterol (VENTOLIN HFA) 108  (90 Base) MCG/ACT inhaler Inhale 2 puffs into the lungs every 6 (six) hours as needed for wheezing or shortness of breath.   B-D UF III MINI PEN NEEDLES 31G X 5 MM MISC 1 Units by Does not apply route daily.   buPROPion (WELLBUTRIN XL) 300 MG 24 hr tablet Take 1 tablet (300 mg total) by mouth daily.   cyanocobalamin (,VITAMIN B-12,) 1000 MCG/ML injection Inject 1 mL (1,000 mcg total) into the muscle every 14 (fourteen) days.   fluticasone (FLONASE) 50 MCG/ACT nasal spray Place 2 sprays into both nostrils daily as needed for allergies or rhinitis.   ibuprofen (ADVIL) 800 MG tablet Take 1 tablet (800 mg total) by mouth every 8 (eight) hours as needed.   scopolamine (TRANSDERM-SCOP) 1 MG/3DAYS Place 1 patch (1.5 mg total) onto the skin every 3 (three) days.   No current facility-administered medications on file prior to visit.     Allergies  Allergen Reactions   Codeine Diarrhea and Nausea And Vomiting    Review Of Systems:  Constitutional:   No  weight loss, night sweats,  Fevers, chills, fatigue, or  lassitude.  HEENT:   No headaches,  Difficulty swallowing,  Tooth/dental problems, or  Sore throat,                No sneezing, itching, ear ache, nasal congestion, post nasal drip,   CV:  No chest pain,  Orthopnea, PND, swelling in lower extremities, anasarca, dizziness, palpitations, syncope.   GI  No heartburn, indigestion, abdominal pain, nausea, vomiting, diarrhea, change in bowel habits, loss of appetite, bloody stools.   Resp: + shortness of breath with exertion or at rest.  No excess mucus, no productive cough,  + non-productive cough,  No coughing up of blood.  No change in color of mucus.  + wheezing.  No chest wall deformity  Skin: no rash or lesions.  GU: no dysuria, change in color of urine, no urgency or frequency.  No flank pain, no hematuria   MS:  No joint pain or swelling.  No decreased range of motion.  No back pain.  Psych:  No change in mood or affect. No depression  or anxiety.  No memory loss.   Vital Signs BP 126/80 (BP Location: Left Arm, Patient Position: Sitting, Cuff Size: Large)    Pulse 73    Temp 98.4 F (36.9 C) (Oral)  Ht 5\' 8"  (1.727 m)    Wt 264 lb (119.7 kg)    LMP 06/01/2015 (Within Days)    SpO2 98%    BMI 40.14 kg/m    Physical Exam:  General- No distress,  A&Ox3, pleasant ENT: No sinus tenderness, TM clear, pale nasal mucosa, no oral exudate,no post nasal drip, no LAN Cardiac: S1, S2, regular rate and rhythm, no murmur Chest: No wheeze/ rales/ dullness; no accessory muscle use, no nasal flaring, no sternal retractions Abd.: Soft Non-tender, ND, BS +, Body mass index is 40.14 kg/m. Ext: No clubbing cyanosis, edema Neuro:  normal strength, MAE x 4, A&O x 3 Skin: No rashes, warm and dry, no lesions  Psych: normal mood and behavior   Assessment/Plan OSA on CPAP Plan Continue on CPAP at bedtime. You appear to be benefiting from the treatment  Goal is to wear for at least 6 hours each night for maximal clinical benefit. Continue to work on weight loss, as the link between excess weight  and sleep apnea is well established.   Remember to establish a good bedtime routine, and work on sleep hygiene.  Limit daytime naps , avoid stimulants such as caffeine and nicotine close to bedtime, exercise daily to promote sleep quality, avoid heavy , spicy, fried , or rich foods before bed. Ensure adequate exposure to natural light during the day,establish a relaxing bedtime routine with a pleasant sleep environment ( Bedroom between 60 and 67 degrees, turn off bright lights , TV or device screens screens , consider black out curtains or white noise machines) Do not drive if sleepy. Remember to clean mask, tubing, filter, and reservoir once weekly with soapy water.   Asthma Flares, but no maintenance ? Seasonal allergies could be trigger Plan For your cough please try: Please add non-sedating anti-histamine daily. Add Flonase ( generic)  nasal spray.  Add Delsym at bedtime for cough. we will try a Breo sample if we have any.  Take 1 puff once daily.  Rinse mouth after use. If this does not help, please stop  We will refill your albuterol nebs Use as needed for shortness of breath or wheezing  Follow up with  Judson Roch NP or Dr. Elsworth Soho   In 2 or before as needed to re-evaluate your asthma and CPAP tolerence Call us sooner if you need [place] sooner. ..     We did not have Breo samples. Patient is going to call office weekly to see if we have any samples. She will come to the office to pick these up. She understands if she flares to seek emergency care.   I spent 40 minutes dedicated to the care of this patient on the date of this encounter to include pre-visit review of records, face-to-face time with the patient discussing conditions above, post visit ordering of testing, clinical documentation with the electronic health record, making appropriate referrals as documented, and communicating necessary information to the patient's healthcare team.   Magdalen Spatz, NP 11/21/2021  10:34 AM

## 2021-11-22 ENCOUNTER — Other Ambulatory Visit: Payer: Self-pay | Admitting: Acute Care

## 2021-11-22 ENCOUNTER — Telehealth: Payer: Self-pay | Admitting: Acute Care

## 2021-11-22 DIAGNOSIS — J45909 Unspecified asthma, uncomplicated: Secondary | ICD-10-CM

## 2021-11-22 MED ORDER — LEVALBUTEROL HCL 0.63 MG/3ML IN NEBU: 0.6300 mg | INHALATION_SOLUTION | Freq: Three times a day (TID) | RESPIRATORY_TRACT | 12 refills | Status: DC | PRN

## 2021-11-22 NOTE — Telephone Encounter (Signed)
Sarah, please advise if you are okay with [place] sending Rx for levalbuterol sol to the pharmacy for pt as the albuterol neb sol is currently on back order. ?

## 2021-11-22 NOTE — Telephone Encounter (Signed)
Bevelyn Ngo, NP  You 1 minute ago (4:59 PM)  ? ?Please send in RX for  levalbuterol sol , 0.63 mg per 3 ml , use as needed up to 3 times a day. If need more frequently, please call to be seen or seek emergency care.    ? ? ?Rx for levalbuterol sol  has been sent to pharmacy for pt. Nothing further needed. ?

## 2021-11-23 ENCOUNTER — Encounter: Payer: Self-pay | Admitting: Nurse Practitioner

## 2021-12-07 ENCOUNTER — Encounter: Payer: Commercial Managed Care - PPO | Attending: Surgery | Admitting: Skilled Nursing Facility1

## 2021-12-07 ENCOUNTER — Other Ambulatory Visit: Payer: Self-pay

## 2021-12-07 DIAGNOSIS — E6609 Other obesity due to excess calories: Secondary | ICD-10-CM | POA: Insufficient documentation

## 2021-12-07 DIAGNOSIS — Z6839 Body mass index (BMI) 39.0-39.9, adult: Secondary | ICD-10-CM | POA: Insufficient documentation

## 2021-12-07 NOTE — Progress Notes (Signed)
Supervised Weight Loss Visit ?Bariatric Nutrition Education ? ?Planned Surgery: sleeve gastrectomy   ?Pt Expectation of Surgery/ Goals: to lose weight ? ?3 out of 6 SWL Appointments  ? ?NUTRITION ASSESSMENT ? ?Anthropometrics  ?Start weight at [facility]: 252 lbs (date: 09/11/2021)  ?Weight: 263.3 pounds ?BMI: 40.02 kg/m2   ?  ?Clinical  ?Medical hx: sleep apnea, bipolar, IBS ?Medications: see list ?Labs: B 12 1379, vitamin D 72.3 ?Notable signs/symptoms: constipation  ?Any previous deficiencies? Yes; vitamin D, vitamin B12, iron  ? ?Lifestyle & Dietary Hx ? ?Pt states she carries excess weight from stress not from eating more.  ? ? ?Pt states she has been very stressed recently but reading and knitting is helping.  ?Pt states she has cut out soda completely now working on reducing sparkling water. Pt states she got a new C-PAP machine which is working very well.  ?Pt states her doctor is going to take labs next week.  ?Pt states she has not had any IBS flares since making some small tweaks with her diet.  ? 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 ?24-Hr Dietary Recall ?First Meal: egg and cheese sandwich ?Snack: half apple ?Second Meal: salad with chicken or tuna with vinaggerrett + lettuce + onions + carrots + cucumbers + tomato  ?Snack: carrots or celery sometimes with ranch or hummus ?Third Meal: meal company: chicken + root veggies + carrots and onions or salad with chicken on top or chicken + sticky rice + sauteed red bell peppers + onions + sugar snap peas ?Snack:  ?Beverages: water, green tea + monk fruit + 1tsp sugar coffee + 1tsp sugar + milk, sometimes soda or sparkling water ? ?Estimated Energy Needs ?Calories: 1500 ? ? ?NUTRITION DIAGNOSIS  ?Overweight/obesity ([place]-3.3) related to past poor dietary habits and physical inactivity as evidenced by patient w/ planned sleeve gastrectomy surgery  following dietary guidelines for continued weight loss. ? ? ?NUTRITION INTERVENTION  ?Nutrition counseling (C-1) and education (E-2) to facilitate bariatric surgery goals. ? ?Pre-Op Goals Progress & New Goals: continued  ?continue: aim to not drink 15 minutes before your meal, not with your meal, and wait until 30 minutes after ?continue: log your food and beverages with the app you like (loseit) (states she keeps forgetting)  ? ?Handouts Provided Include  ?N/A ? ?Learning Style & Readiness for Change ?Teaching method utilized: Visual & Auditory  ?Demonstrated degree of understanding via: Teach Back  ?Readiness Level: contemplative  ?Barriers to learning/adherence to lifestyle change: none identified  ? ?RD's Notes for next Visit  ?Assess pts adherence to chosen goals ? ? ?MONITORING & EVALUATION ?Dietary intake, weekly physical activity, body weight, and pre-op goals in 1 month.  ? ?Next Steps  ?Patient is to return to [facility] in 1 month ?

## 2021-12-12 ENCOUNTER — Other Ambulatory Visit: Payer: Self-pay | Admitting: Nurse Practitioner

## 2021-12-12 DIAGNOSIS — Z1231 Encounter for screening mammogram for malignant neoplasm of breast: Secondary | ICD-10-CM

## 2021-12-13 ENCOUNTER — Ambulatory Visit
Admission: RE | Admit: 2021-12-13 | Discharge: 2021-12-13 | Disposition: A | Discharge status: Home / Self Care | Payer: Commercial Managed Care - PPO | Source: Ambulatory Visit | Attending: Nurse Practitioner | Admitting: Nurse Practitioner

## 2021-12-13 ENCOUNTER — Other Ambulatory Visit: Payer: Self-pay

## 2021-12-13 DIAGNOSIS — Z1231 Encounter for screening mammogram for malignant neoplasm of breast: Secondary | ICD-10-CM

## 2021-12-13 NOTE — Progress Notes (Signed)
? ?BP 121/75   Pulse 74   Temp 98.1 ?F (36.7 ?C) (Oral)   Ht 5' 8.2" (1.732 m)   Wt 265 lb 12.8 oz (120.6 kg)   LMP 06/01/2015 (Within Days)   SpO2 96%   BMI 40.18 kg/m?   ? ?Subjective:  ? ? Patient ID: Emily [name], female    DOB: Aug 28, 1981, 41 y.o.   MRN: [id number] ? ?HPI: ?Emily [name] is a 41 y.o. female presenting on 12/14/2021 for comprehensive medical examination. Current medical complaints include:none ? ?She currently lives with: ?Menopausal Symptoms: no ? ? Denies HA, CP, SOB, dizziness, palpitations, visual changes, and lower extremity swelling. ? ?WEIGHT GAIN ?Duration: ongoing ?Previous attempts at weight loss:  medications and nutritionist ? ? ?LUNG CHECK ?Patient states her breathing is getting better.  The allergy season has made it a little bit worse.  She now has a nebulizer which has helped a lot.  ? ?MOOD ?Patient states her mood is well controlled.  Denies concerns at visit today.  Her Wellbutrin is helping her.   ? ? ?Depression Screen done today and results listed below:  ? ?  12/14/2021  ?  8:08 AM 09/11/2021  ?  3:40 PM 03/17/2021  ?  9:07 AM 12/23/2019  ?  8:43 AM 07/31/2018  ?  9:00 AM  ?Depression screen PHQ 2/9  ?Decreased Interest 1 0 0 0 0  ?Down, Depressed, Hopeless 0 0 0 0 0  ?PHQ - 2 Score 1 0 0 0 0  ?Altered sleeping 0  3 1 3   ?Tired, decreased energy 1  0 1 3  ?Change in appetite 0  0 1 0  ?Feeling bad or failure about yourself  0  0 0 0  ?Trouble concentrating 0  0 0 0  ?Moving slowly or fidgety/restless 0  0 0 0  ?Suicidal thoughts 0  0 0 0  ?PHQ-9 Score 2  3 3 6   ?Difficult doing work/chores Not difficult at all  Not difficult at all  Not difficult at all  ? ? ?The patient does not have a history of falls. I did complete a risk assessment for falls. A plan of care for falls was documented. ? ? ?Past Medical History:  ?Past Medical History:  ?Diagnosis Date  ? Anemia   ? Asthma   ? sports induced inhalers  ? Bipolar 1 disorder (HCC)   ? Conversion disorder   ? Insomnia    ? Migraines   ? migraines  ? MRSA infection 2010  ? Ovarian cyst   ? left 2.5cm  ? Sleep apnea   ? does not use a CPAP  ? Vitamin B 12 deficiency   ? Vitamin D deficiency   ? ? ?Surgical History:  ?Past Surgical History:  ?Procedure Laterality Date  ? BUNIONECTOMY    ? ESOPHAGOGASTRODUODENOSCOPY (EGD) WITH PROPOFOL N/A 11/03/2018  ? Procedure: ESOPHAGOGASTRODUODENOSCOPY (EGD) WITH PROPOFOL;  Surgeon: 2011, MD;  Location: [facility] ENDOSCOPY;  Service: Gastroenterology;  Laterality: N/A;  ? HAMMER TOE SURGERY    ? HAND SURGERY  09/24/2012  ? cyst removed  ? INTRAUTERINE DEVICE (IUD) INSERTION N/A 05/09/2015  ? Procedure: INTRAUTERINE DEVICE (IUD) INSERTION;  Surgeon: 11/22/2012, MD;  Location: [facility] ORS;  Service: Gynecology;  Laterality: N/A;  ? LAPAROSCOPIC ASSISTED VAGINAL HYSTERECTOMY N/A 06/20/2015  ? Procedure: LAPAROSCOPIC ASSISTED VAGINAL HYSTERECTOMY, BILATERAL SALPINGECTOMY, CYSTOSCOPY, IUD REMOVAL ;  Surgeon: Hildred Laser, MD;  Location: [facility] ORS;  Service: Gynecology;  Laterality: N/A;  ?  LAPAROSCOPIC TUBAL LIGATION Bilateral 05/09/2015  ? Procedure: LAPAROSCOPIC TUBAL LIGATION;  Surgeon: Hildred LaserAnika Cherry, MD;  Location: [facility] ORS;  Service: Gynecology;  Laterality: Bilateral;  ? NASAL SINUS SURGERY    ? TOTAL ABDOMINAL HYSTERECTOMY W/ BILATERAL SALPINGOOPHORECTOMY    ? TUBAL LIGATION  09/24/2005  ? ? ?Medications:  ?Current Outpatient Medications on File Prior to Visit  ?Medication Sig  ? albuterol (PROVENTIL) (2.5 MG/3ML) 0.083% nebulizer solution Take 3 mLs (2.5 mg total) by nebulization every 6 (six) hours as needed for wheezing or shortness of breath.  ? B-D UF III MINI PEN NEEDLES 31G X 5 MM MISC 1 Units by Does not apply route daily.  ? fluticasone (FLONASE) 50 MCG/ACT nasal spray Place 2 sprays into both nostrils daily as needed for allergies or rhinitis.  ? ibuprofen (ADVIL) 800 MG tablet Take 1 tablet (800 mg total) by mouth every 8 (eight) hours as needed.  ? levalbuterol (XOPENEX) 0.63 MG/3ML  nebulizer solution Take 3 mLs (0.63 mg total) by nebulization 3 (three) times daily as needed for wheezing or shortness of breath.  ? scopolamine (TRANSDERM-SCOP) 1 MG/3DAYS Place 1 patch (1.5 mg total) onto the skin every 3 (three) days.  ? vitamin B-12 (CYANOCOBALAMIN) 1000 MCG tablet Take 1,000 mcg by mouth every other day.  ? ?No current facility-administered medications on file prior to visit.  ? ? ?Allergies:  ?Allergies  ?Allergen Reactions  ? Codeine Diarrhea and Nausea And Vomiting  ? ? ?Social History:  ?Social History  ? ?Socioeconomic History  ? Marital status: Married  ?  Spouse name: Not on file  ? Number of children: Not on file  ? Years of education: Not on file  ? Highest education level: Not on file  ?Occupational History  ? Not on file  ?Tobacco Use  ? Smoking status: Former  ?  Packs/day: 1.00  ?  Years: 10.00  ?  Pack years: 10.00  ?  Types: Cigarettes  ?  Quit date: 09/25/2003  ?  Years since quitting: 18.2  ? Smokeless tobacco: Never  ?Vaping Use  ? Vaping Use: Never used  ?Substance and Sexual Activity  ? Alcohol use: Yes  ?  Alcohol/week: 0.0 - 1.0 standard drinks  ?  Comment: pt states she is a social drinker  ? Drug use: No  ? Sexual activity: Yes  ?  Birth control/protection: Surgical  ?Other Topics Concern  ? Not on file  ?Social History Narrative  ? Not on file  ? ?Social Determinants of Health  ? ?Financial Resource Strain: Not on file  ?Food Insecurity: Not on file  ?Transportation Needs: Not on file  ?Physical Activity: Not on file  ?Stress: Not on file  ?Social Connections: Not on file  ?Intimate Partner Violence: Not on file  ? ?Social History  ? ?Tobacco Use  ?Smoking Status Former  ? Packs/day: 1.00  ? Years: 10.00  ? Pack years: 10.00  ? Types: Cigarettes  ? Quit date: 09/25/2003  ? Years since quitting: 18.2  ?Smokeless Tobacco Never  ? ?Social History  ? ?Substance and Sexual Activity  ?Alcohol Use Yes  ? Alcohol/week: 0.0 - 1.0 standard drinks  ? Comment: pt states she is a  social drinker  ? ? ?Family History:  ?Family History  ?Problem Relation Age of Onset  ? Bipolar disorder Mother   ? Anxiety disorder Mother   ? Cancer Mother   ?     brain  ? Glaucoma Mother   ? Heart disease  Father   ? Diabetes Father   ? Cancer Father   ? Bipolar disorder Sister   ? Schizophrenia Sister   ? Breast cancer Brother   ? Breast cancer Maternal Aunt   ? Cancer Maternal Grandmother   ?     lung  ? Heart disease Maternal Grandfather   ? Ovarian cancer Neg Hx   ? ? ?Past medical history, surgical history, medications, allergies, family history and social history reviewed with patient today and changes made to appropriate areas of the chart.  ? ?Review of Systems  ?Eyes:  Negative for blurred vision and double vision.  ?Respiratory:  Negative for shortness of breath.   ?Cardiovascular:  Negative for chest pain, palpitations and leg swelling.  ?Neurological:  Negative for dizziness and headaches.  ?Psychiatric/Behavioral:  Negative for depression and suicidal ideas. The patient is not nervous/anxious.   ?All other ROS negative except what is listed above and in the HPI.  ? ?   ?Objective:  ?  ?BP 121/75   Pulse 74   Temp 98.1 ?F (36.7 ?C) (Oral)   Ht 5' 8.2" (1.732 m)   Wt 265 lb 12.8 oz (120.6 kg)   LMP 06/01/2015 (Within Days)   SpO2 96%   BMI 40.18 kg/m?   ?Wt Readings from Last 3 Encounters:  ?12/14/21 265 lb 12.8 oz (120.6 kg)  ?12/07/21 263 lb 3.2 oz (119.4 kg)  ?11/21/21 264 lb (119.7 kg)  ?  ?Physical Exam ?Vitals and nursing note reviewed.  ?Constitutional:   ?   General: She is awake. She is not in acute distress. ?   Appearance: She is well-developed. She is obese. She is not ill-appearing.  ?HENT:  ?   Head: Normocephalic and atraumatic.  ?   Right Ear: Hearing, tympanic membrane, ear canal and external ear normal. No drainage.  ?   Left Ear: Hearing, tympanic membrane, ear canal and external ear normal. No drainage.  ?   Nose: Nose normal.  ?   Right Sinus: No maxillary sinus tenderness  or frontal sinus tenderness.  ?   Left Sinus: No maxillary sinus tenderness or frontal sinus tenderness.  ?   Mouth/Throat:  ?   Mouth: Mucous membranes are moist.  ?   Pharynx: Oropharynx is clear. Uvula midline. No phar

## 2021-12-14 ENCOUNTER — Encounter: Payer: Self-pay | Admitting: Nurse Practitioner

## 2021-12-14 ENCOUNTER — Ambulatory Visit (INDEPENDENT_AMBULATORY_CARE_PROVIDER_SITE_OTHER): Payer: Commercial Managed Care - PPO | Admitting: Nurse Practitioner

## 2021-12-14 VITALS — BP 121/75 | HR 74 | Temp 98.1°F | Ht 68.2 in | Wt 265.8 lb

## 2021-12-14 DIAGNOSIS — I2721 Secondary pulmonary arterial hypertension: Secondary | ICD-10-CM | POA: Insufficient documentation

## 2021-12-14 DIAGNOSIS — Z136 Encounter for screening for cardiovascular disorders: Secondary | ICD-10-CM

## 2021-12-14 DIAGNOSIS — E6609 Other obesity due to excess calories: Secondary | ICD-10-CM

## 2021-12-14 DIAGNOSIS — E559 Vitamin D deficiency, unspecified: Secondary | ICD-10-CM

## 2021-12-14 DIAGNOSIS — Z6839 Body mass index (BMI) 39.0-39.9, adult: Secondary | ICD-10-CM

## 2021-12-14 DIAGNOSIS — R053 Chronic cough: Secondary | ICD-10-CM

## 2021-12-14 DIAGNOSIS — J849 Interstitial pulmonary disease, unspecified: Secondary | ICD-10-CM

## 2021-12-14 DIAGNOSIS — E538 Deficiency of other specified B group vitamins: Secondary | ICD-10-CM

## 2021-12-14 DIAGNOSIS — Z0000 Encounter for general adult medical examination without abnormal findings: Secondary | ICD-10-CM

## 2021-12-14 DIAGNOSIS — F3170 Bipolar disorder, currently in remission, most recent episode unspecified: Secondary | ICD-10-CM

## 2021-12-14 LAB — URINALYSIS, ROUTINE W REFLEX MICROSCOPIC
Bilirubin, UA: NEGATIVE
Glucose, UA: NEGATIVE
Ketones, UA: NEGATIVE
Leukocytes,UA: NEGATIVE
Nitrite, UA: NEGATIVE
Protein,UA: NEGATIVE
RBC, UA: NEGATIVE
Specific Gravity, UA: 1.025 (ref 1.005–1.030)
Urobilinogen, Ur: 0.2 mg/dL (ref 0.2–1.0)
pH, UA: 5.5 (ref 5.0–7.5)

## 2021-12-14 MED ORDER — BUPROPION HCL ER (XL) 300 MG PO TB24: 300.0000 mg | ORAL_TABLET | Freq: Every day | ORAL | 1 refills | Status: DC

## 2021-12-14 NOTE — Assessment & Plan Note (Signed)
Chronic. Followed by Pulmonology.  Continue to follow per their recommendations. 

## 2021-12-14 NOTE — Assessment & Plan Note (Signed)
Chronic.  Controlled.  Continue with current medication regimen of Wellbutrin 300mg  daily.  Refill sent today.  Labs ordered today.  Return to clinic in 6 months for reevaluation.  Call sooner if concerns arise.  ? ?

## 2021-12-14 NOTE — Assessment & Plan Note (Signed)
Labs ordered. Will make recommendations based on lab results.  ?

## 2021-12-14 NOTE — Assessment & Plan Note (Signed)
Labs ordered. Will make recommendations based on lab results.  ?

## 2021-12-14 NOTE — Progress Notes (Signed)
Please let patient know her Mammogram did not show any evidence of a malignancy.  The recommendation is to repeat the Mammogram in 1 year.  

## 2021-12-14 NOTE — Assessment & Plan Note (Signed)
Chronic. Continue to follow up with Nutrition to aid in weight loss.  ?

## 2021-12-15 LAB — COMPREHENSIVE METABOLIC PANEL
ALT: 17 IU/L (ref 0–32)
AST: 19 IU/L (ref 0–40)
Albumin/Globulin Ratio: 2 (ref 1.2–2.2)
Albumin: 4.8 g/dL (ref 3.8–4.8)
Alkaline Phosphatase: 106 IU/L (ref 44–121)
BUN/Creatinine Ratio: 9 (ref 9–23)
BUN: 10 mg/dL (ref 6–24)
Bilirubin Total: 0.5 mg/dL (ref 0.0–1.2)
CO2: 24 mmol/L (ref 20–29)
Calcium: 9.6 mg/dL (ref 8.7–10.2)
Chloride: 103 mmol/L (ref 96–106)
Creatinine, Ser: 1.13 mg/dL — ABNORMAL HIGH (ref 0.57–1.00)
Globulin, Total: 2.4 g/dL (ref 1.5–4.5)
Glucose: 79 mg/dL (ref 70–99)
Potassium: 4.4 mmol/L (ref 3.5–5.2)
Sodium: 142 mmol/L (ref 134–144)
Total Protein: 7.2 g/dL (ref 6.0–8.5)
eGFR: 63 mL/min/{1.73_m2} (ref >59)

## 2021-12-15 LAB — CBC WITH DIFFERENTIAL/PLATELET
Basophils Absolute: 0 10*3/uL (ref 0.0–0.2)
Basos: 1 %
EOS (ABSOLUTE): 0.1 10*3/uL (ref 0.0–0.4)
Eos: 1 %
Hematocrit: 43.8 % (ref 34.0–46.6)
Hemoglobin: 14.4 g/dL (ref 11.1–15.9)
Immature Grans (Abs): 0 10*3/uL (ref 0.0–0.1)
Immature Granulocytes: 0 %
Lymphocytes Absolute: 2.7 10*3/uL (ref 0.7–3.1)
Lymphs: 40 %
MCH: 29.9 pg (ref 26.6–33.0)
MCHC: 32.9 g/dL (ref 31.5–35.7)
MCV: 91 fL (ref 79–97)
Monocytes Absolute: 0.6 10*3/uL (ref 0.1–0.9)
Monocytes: 9 %
Neutrophils Absolute: 3.3 10*3/uL (ref 1.4–7.0)
Neutrophils: 49 %
Platelets: 272 10*3/uL (ref 150–450)
RBC: 4.81 x10E6/uL (ref 3.77–5.28)
RDW: 12.4 % (ref 11.7–15.4)
WBC: 6.8 10*3/uL (ref 3.4–10.8)

## 2021-12-15 LAB — LIPID PANEL
Chol/HDL Ratio: 4.5 ratio — ABNORMAL HIGH (ref 0.0–4.4)
Cholesterol, Total: 202 mg/dL — ABNORMAL HIGH (ref 100–199)
HDL: 45 mg/dL (ref >39)
LDL Chol Calc (NIH): 134 mg/dL — ABNORMAL HIGH (ref 0–99)
Triglycerides: 129 mg/dL (ref 0–149)
VLDL Cholesterol Cal: 23 mg/dL (ref 5–40)

## 2021-12-15 LAB — VITAMIN D 25 HYDROXY (VIT D DEFICIENCY, FRACTURES): Vit D, 25-Hydroxy: 41.6 ng/mL (ref 30.0–100.0)

## 2021-12-15 LAB — VITAMIN B12: Vitamin B-12: 873 pg/mL (ref 232–1245)

## 2021-12-15 LAB — TSH: TSH: 3.64 u[IU]/mL (ref 0.450–4.500)

## 2021-12-15 NOTE — Progress Notes (Signed)
Hi Emily [name].  Your lab work shows that your kidney function has declined some.  It is consistent with the last couple of years.  Something we will need to keep an eye on.  Make sure to drink plenty of water and avoid NSAIDS such as ibuprofen, aleve, and motrin. Cholesterol is elevated from prior.  Recommend a low fat diet and exercise as tolerated.  B12 and vitamin D are within normal range.  Continue with your current medication regimen.  Follow up as discussed.

## 2022-01-07 ENCOUNTER — Encounter: Payer: Self-pay | Admitting: Nurse Practitioner

## 2022-01-08 ENCOUNTER — Encounter: Payer: Commercial Managed Care - PPO | Attending: Surgery | Admitting: Skilled Nursing Facility1

## 2022-01-08 DIAGNOSIS — Z6839 Body mass index (BMI) 39.0-39.9, adult: Secondary | ICD-10-CM | POA: Diagnosis not present

## 2022-01-08 DIAGNOSIS — Z713 Dietary counseling and surveillance: Secondary | ICD-10-CM | POA: Diagnosis not present

## 2022-01-08 DIAGNOSIS — E6609 Other obesity due to excess calories: Secondary | ICD-10-CM

## 2022-01-08 NOTE — Progress Notes (Signed)
Supervised Weight Loss Visit ?Bariatric Nutrition Education ? ?Planned Surgery: sleeve gastrectomy   ?Pt Expectation of Surgery/ Goals: to lose weight ? ?4 out of 6 [facility] Appointments  ? ?NUTRITION ASSESSMENT ? ?Anthropometrics  ?Start weight at [facility]: 252 lbs (date: 09/11/2021)  ?Weight: 268 pounds ?BMI: 40.75 kg/m2   ?  ?Clinical  ?Medical hx: sleep apnea, bipolar, IBS ?Medications: see list ?Labs: LDL 134, creatinine 1.13, B26 873 ?Notable signs/symptoms: constipation  ?Any previous deficiencies? Yes; vitamin D, vitamin B12, iron  ? ?Lifestyle & Dietary Hx ? ?Pt states she carries excess weight from stress not from eating more.  ? ?Pt states [place] and feel her ankles are swollen from that trip and as able to see her daughter who works there. Pt states she did not have a drink at [employer]. ?Pt states she is traveling again to [place] soon, [place],  Stating she will be the sober one in the group ? ? 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 ?24-Hr Dietary Recall ?First Meal: egg and cheese sandwich or skipped ?Snack: half apple ?Second Meal: salad with chicken or tuna with vinaggerrett + lettuce + onions + carrots + cucumbers + tomato or subway salad with sweet chicken teriyaki  ?Snack: carrots or celery sometimes with ranch or hummus ?Third Meal: meal company: chicken + root veggies + carrots and onions or salad with chicken on top or chicken + sticky rice + sauteed red bell peppers + onions + sugar snap peas ?Snack:  ?Beverages: water, green tea + monk fruit + 1tsp sugar coffee + 1tsp sugar + milk, sometimes soda or sparkling water ? ?Estimated Energy Needs ?Calories: 1500 ? ? ?NUTRITION DIAGNOSIS  ?Overweight/obesity ([place]-3.3) related to past poor dietary habits and physical inactivity as evidenced by patient w/ planned sleeve gastrectomy surgery  following dietary guidelines for continued weight loss. ? ? ?NUTRITION INTERVENTION  ?Nutrition counseling (C-1) and education (E-2) to facilitate bariatric surgery goals. ? ? ?Pre-Op Goals Progress & New Goals: continued  ?continue: aim to not drink 15 minutes before your meal, not with your meal, and wait until 30 minutes after ?continue: log your food and beverages with the app you like (loseit) (states she keeps forgetting)  ?New: Non-starchy vegetables with every meal ? ?Handouts Provided Include  ?N/A ? ?Learning Style & Readiness for Change ?Teaching method utilized: Visual & Auditory  ?Demonstrated degree of understanding via: Teach Back  ?Readiness Level: contemplative  ?Barriers to learning/adherence to lifestyle change: none identified  ? ?RD's Notes for next Visit  ?Assess pts adherence to chosen goals ? ? ?MONITORING & EVALUATION ?Dietary intake, weekly physical activity, body weight, and pre-op goals in 1 month.  ? ?Next Steps  ?Patient is to return to [facility] in 1 month ?

## 2022-01-10 ENCOUNTER — Encounter: Payer: Self-pay | Admitting: Nurse Practitioner

## 2022-01-10 ENCOUNTER — Ambulatory Visit (INDEPENDENT_AMBULATORY_CARE_PROVIDER_SITE_OTHER): Payer: Commercial Managed Care - PPO | Admitting: Nurse Practitioner

## 2022-01-10 VITALS — BP 128/88 | HR 81 | Temp 98.1°F | Wt 263.2 lb

## 2022-01-10 DIAGNOSIS — R600 Localized edema: Secondary | ICD-10-CM

## 2022-01-10 NOTE — Progress Notes (Signed)
? ?BP 128/88   Pulse 81   Temp 98.1 ?F (36.7 ?C) (Oral)   Wt 263 lb 3.2 oz (119.4 kg)   LMP 06/01/2015 (Within Days)   SpO2 96%   BMI 40.02 kg/m?   ? ?Subjective:  ? ? Patient ID: Emily [name], female    DOB: 1980-11-04, 41 y.o.   MRN: [id number] ? ?HPI: ?Emily [name] is a 41 y.o. female ? ?Chief Complaint  ?Patient presents with  ? Edema  ?  Bilateral feet swelling during a drive from [place] over spring break. Swelling mostly resolved today per pt. Still having some swelling today  ? ?Patient states on her drive home from [place] she had swelling in her lower extremities.  It has mostly resolved.  She was in the car for approximately 8-9 hours.  They do stop every 2-3 hours.  She was drinking plenty of water during the drive.  Patient states she was not able to see her ankle earlier this week.  The swelling was into her toes as well.  She is still having a lot of pain in her feet. Hurts to put shoes on.  Denies SOB, cough, and chest pain. ? ? ?Relevant past medical, surgical, family and social history reviewed and updated as indicated. Interim medical history since our last visit reviewed. ?Allergies and medications reviewed and updated. ? ?Review of Systems  ?Respiratory:  Negative for cough and shortness of breath.   ?Cardiovascular:  Positive for leg swelling. Negative for chest pain.  ? ?Per HPI unless specifically indicated above ? ?   ?Objective:  ?  ?BP 128/88   Pulse 81   Temp 98.1 ?F (36.7 ?C) (Oral)   Wt 263 lb 3.2 oz (119.4 kg)   LMP 06/01/2015 (Within Days)   SpO2 96%   BMI 40.02 kg/m?   ?Wt Readings from Last 3 Encounters:  ?01/10/22 263 lb 3.2 oz (119.4 kg)  ?01/08/22 268 lb (121.6 kg)  ?12/14/21 265 lb 12.8 oz (120.6 kg)  ?  ?Physical Exam ?Vitals and nursing note reviewed.  ?Constitutional:   ?   General: She is not in acute distress. ?   Appearance: Normal appearance. She is normal weight. She is not ill-appearing, toxic-appearing or diaphoretic.  ?HENT:  ?   Head: Normocephalic.  ?    Right Ear: External ear normal.  ?   Left Ear: External ear normal.  ?   Nose: Nose normal.  ?   Mouth/Throat:  ?   Mouth: Mucous membranes are moist.  ?   Pharynx: Oropharynx is clear.  ?Eyes:  ?   General:     ?   Right eye: No discharge.     ?   Left eye: No discharge.  ?   Extraocular Movements: Extraocular movements intact.  ?   Conjunctiva/sclera: Conjunctivae normal.  ?   Pupils: Pupils are equal, round, and reactive to light.  ?Cardiovascular:  ?   Rate and Rhythm: Normal rate and regular rhythm.  ?   Heart sounds: No murmur heard. ?Pulmonary:  ?   Effort: Pulmonary effort is normal. No respiratory distress.  ?   Breath sounds: Normal breath sounds. No wheezing or rales.  ?Musculoskeletal:  ?   Cervical back: Normal range of motion and neck supple.  ?   Right lower leg: 1+ Edema present.  ?   Left lower leg: 1+ Edema present.  ?   Right foot: Swelling (trace edema) present.  ?   Left foot: Swelling (trace  edema) present.  ?   Comments: Pain with palpation of bilateral feet  ?Skin: ?   General: Skin is warm and dry.  ?   Capillary Refill: Capillary refill takes less than 2 seconds.  ?Neurological:  ?   General: No focal deficit present.  ?   Mental Status: She is alert and oriented to person, place, and time. Mental status is at baseline.  ?Psychiatric:     ?   Mood and Affect: Mood normal.     ?   Behavior: Behavior normal.     ?   Thought Content: Thought content normal.     ?   Judgment: Judgment normal.  ? ? ?Results for orders placed or performed in visit on 12/14/21  ?B12  ?Result Value Ref Range  ? Vitamin B-12 873 232 - 1,245 pg/mL  ?Vitamin D (25 hydroxy)  ?Result Value Ref Range  ? Vit D, 25-Hydroxy 41.6 30.0 - 100.0 ng/mL  ?CBC with Differential/Platelet  ?Result Value Ref Range  ? WBC 6.8 3.4 - 10.8 x10E3/uL  ? RBC 4.81 3.77 - 5.28 x10E6/uL  ? Hemoglobin 14.4 11.1 - 15.9 g/dL  ? Hematocrit 43.8 34.0 - 46.6 %  ? MCV 91 79 - 97 fL  ? MCH 29.9 26.6 - 33.0 pg  ? MCHC 32.9 31.5 - 35.7 g/dL  ? RDW 12.4  11.7 - 15.4 %  ? Platelets 272 150 - 450 x10E3/uL  ? Neutrophils 49 Not Estab. %  ? Lymphs 40 Not Estab. %  ? Monocytes 9 Not Estab. %  ? Eos 1 Not Estab. %  ? Basos 1 Not Estab. %  ? Neutrophils Absolute 3.3 1.4 - 7.0 x10E3/uL  ? Lymphocytes Absolute 2.7 0.7 - 3.1 x10E3/uL  ? Monocytes Absolute 0.6 0.1 - 0.9 x10E3/uL  ? EOS (ABSOLUTE) 0.1 0.0 - 0.4 x10E3/uL  ? Basophils Absolute 0.0 0.0 - 0.2 x10E3/uL  ? Immature Granulocytes 0 Not Estab. %  ? Immature Grans (Abs) 0.0 0.0 - 0.1 x10E3/uL  ?Comprehensive metabolic panel  ?Result Value Ref Range  ? Glucose 79 70 - 99 mg/dL  ? BUN 10 6 - 24 mg/dL  ? Creatinine, Ser 1.13 (H) 0.57 - 1.00 mg/dL  ? eGFR 63 >59 mL/min/1.73  ? BUN/Creatinine Ratio 9 9 - 23  ? Sodium 142 134 - 144 mmol/L  ? Potassium 4.4 3.5 - 5.2 mmol/L  ? Chloride 103 96 - 106 mmol/L  ? CO2 24 20 - 29 mmol/L  ? Calcium 9.6 8.7 - 10.2 mg/dL  ? Total Protein 7.2 6.0 - 8.5 g/dL  ? Albumin 4.8 3.8 - 4.8 g/dL  ? Globulin, Total 2.4 1.5 - 4.5 g/dL  ? Albumin/Globulin Ratio 2.0 1.2 - 2.2  ? Bilirubin Total 0.5 0.0 - 1.2 mg/dL  ? Alkaline Phosphatase 106 44 - 121 IU/L  ? AST 19 0 - 40 IU/L  ? ALT 17 0 - 32 IU/L  ?Lipid panel  ?Result Value Ref Range  ? Cholesterol, Total 202 (H) 100 - 199 mg/dL  ? Triglycerides 129 0 - 149 mg/dL  ? HDL 45 >39 mg/dL  ? VLDL Cholesterol Cal 23 5 - 40 mg/dL  ? LDL Chol Calc (NIH) 134 (H) 0 - 99 mg/dL  ? Chol/HDL Ratio 4.5 (H) 0.0 - 4.4 ratio  ?TSH  ?Result Value Ref Range  ? TSH 3.640 0.450 - 4.500 uIU/mL  ?Urinalysis, Routine w reflex microscopic  ?Result Value Ref Range  ? Specific Gravity, UA 1.025 1.005 - 1.030  ?   pH, UA 5.5 5.0 - 7.5  ? Color, UA Yellow Yellow  ? Appearance Ur Clear Clear  ? Leukocytes,UA Negative Negative  ? Protein,UA Negative Negative/Trace  ? Glucose, UA Negative Negative  ? Ketones, UA Negative Negative  ? RBC, UA Negative Negative  ? Bilirubin, UA Negative Negative  ? Urobilinogen, Ur 0.2 0.2 - 1.0 mg/dL  ? Nitrite, UA Negative Negative  ? ?   ?Assessment &  Plan:  ? ?Problem List Items Addressed This Visit   ?None ?Visit Diagnoses   ? ? Lower extremity edema    -  Primary  ? Likely related to long car ride. Discussed compression stockings and prevention measures. Avoid salt in diet an drink plenty of water. Labs ordered.  ? Relevant Orders  ? Comp Met (CMET)  ? CBC w/Diff  ? ?  ?  ? ?Follow up plan: ?Return if symptoms worsen or fail to improve. ? ? ? ? ? ?

## 2022-01-11 LAB — CBC WITH DIFFERENTIAL/PLATELET
Basophils Absolute: 0 10*3/uL (ref 0.0–0.2)
Basos: 0 %
EOS (ABSOLUTE): 0.1 10*3/uL (ref 0.0–0.4)
Eos: 1 %
Hematocrit: 46.5 % (ref 34.0–46.6)
Hemoglobin: 15.4 g/dL (ref 11.1–15.9)
Immature Grans (Abs): 0 10*3/uL (ref 0.0–0.1)
Immature Granulocytes: 0 %
Lymphocytes Absolute: 2.6 10*3/uL (ref 0.7–3.1)
Lymphs: 38 %
MCH: 29.6 pg (ref 26.6–33.0)
MCHC: 33.1 g/dL (ref 31.5–35.7)
MCV: 89 fL (ref 79–97)
Monocytes Absolute: 0.7 10*3/uL (ref 0.1–0.9)
Monocytes: 10 %
Neutrophils Absolute: 3.4 10*3/uL (ref 1.4–7.0)
Neutrophils: 51 %
Platelets: 314 10*3/uL (ref 150–450)
RBC: 5.2 x10E6/uL (ref 3.77–5.28)
RDW: 12.9 % (ref 11.7–15.4)
WBC: 6.8 10*3/uL (ref 3.4–10.8)

## 2022-01-11 LAB — COMPREHENSIVE METABOLIC PANEL
ALT: 65 IU/L — ABNORMAL HIGH (ref 0–32)
AST: 53 IU/L — ABNORMAL HIGH (ref 0–40)
Albumin/Globulin Ratio: 1.5 (ref 1.2–2.2)
Albumin: 4.6 g/dL (ref 3.8–4.8)
Alkaline Phosphatase: 104 IU/L (ref 44–121)
BUN/Creatinine Ratio: 10 (ref 9–23)
BUN: 10 mg/dL (ref 6–24)
Bilirubin Total: 1 mg/dL (ref 0.0–1.2)
CO2: 23 mmol/L (ref 20–29)
Calcium: 10 mg/dL (ref 8.7–10.2)
Chloride: 105 mmol/L (ref 96–106)
Creatinine, Ser: 1.04 mg/dL — ABNORMAL HIGH (ref 0.57–1.00)
Globulin, Total: 3 g/dL (ref 1.5–4.5)
Glucose: 75 mg/dL (ref 70–99)
Potassium: 4.8 mmol/L (ref 3.5–5.2)
Sodium: 143 mmol/L (ref 134–144)
Total Protein: 7.6 g/dL (ref 6.0–8.5)
eGFR: 69 mL/min/{1.73_m2} (ref >59)

## 2022-01-11 NOTE — Progress Notes (Signed)
HI Emily [name].  Your lab work looks good.  Kidney function improved some from last visit.  Likely not related to the swelling.  We will continue to watch this in the future.

## 2022-01-19 ENCOUNTER — Ambulatory Visit (INDEPENDENT_AMBULATORY_CARE_PROVIDER_SITE_OTHER): Payer: Commercial Managed Care - PPO | Admitting: Acute Care

## 2022-01-19 ENCOUNTER — Encounter: Payer: Self-pay | Admitting: Acute Care

## 2022-01-19 VITALS — BP 128/74 | HR 73 | Temp 98.6°F | Ht 68.0 in | Wt 265.0 lb

## 2022-01-19 DIAGNOSIS — Z9989 Dependence on other enabling machines and devices: Secondary | ICD-10-CM | POA: Diagnosis not present

## 2022-01-19 DIAGNOSIS — G4733 Obstructive sleep apnea (adult) (pediatric): Secondary | ICD-10-CM

## 2022-01-19 DIAGNOSIS — J4541 Moderate persistent asthma with (acute) exacerbation: Secondary | ICD-10-CM | POA: Diagnosis not present

## 2022-01-19 LAB — POCT EXHALED NITRIC OXIDE: FeNO level (ppb): 10

## 2022-01-19 MED ORDER — PREDNISONE 10 MG PO TABS: ORAL_TABLET | ORAL | 0 refills | Status: DC

## 2022-01-19 NOTE — Patient Instructions (Addendum)
It is good to see you today. ?We will send in a prednisone taper ?Prednisone taper; 10 mg tablets: 4 tabs x 2 days, 3 tabs x 2 days, 2 tabs x 2 days 1 tab x 2 days then stop.  ?If you continue to have breakthrough symptoms, we will consider adding an inhaler ?Continue using Delsym for cough/ Robitussin  ?Consider Nasonex nasal spray at follow up.  ?Video Visit in 2 weeks with Maralyn Sago NP or Dr. Celine Mans to evaluate how you are after pred taper.  ?Continue on CPAP at bedtime. You appear to be benefiting from the treatment  ?Goal is to wear for at least 6 hours each night for maximal clinical benefit. ?Continue to work on weight loss, as the link between excess weight  and sleep apnea is well established.   ?Remember to establish a good bedtime routine, and work on sleep hygiene.  ?Limit daytime naps , avoid stimulants such as caffeine and nicotine close to bedtime, exercise daily to promote sleep quality, avoid heavy , spicy, fried , or rich foods before bed. Ensure adequate exposure to natural light during the day,establish a relaxing bedtime routine with a pleasant sleep environment ( Bedroom between 60 and 67 degrees, turn off bright lights , TV or device screens screens , consider black out curtains or white noise machines) ?Do not drive if sleepy. ?Remember to clean mask, tubing, filter, and reservoir once weekly with soapy water.  ?Follow up in 2 weeks as video visit  as above ?Call if you need [place] sooner.  ? ?

## 2022-01-19 NOTE — Progress Notes (Signed)
? ?History of Present Illness ?Emily [name] is a 41 y.o. female former smoker ( Quit 2005) with OSA on CPAP therapy, asthma and dyspnea on exertion since Covid diagnosis 12/21and again 05/2021. She did not require hospitalization. She is followed by Dr. Vassie [name]. ?Pt. Does not take maintenance therapy for her asthma.  ?Allergies and asthma flares do affect her CPAP compliance ? ? ?01/19/2022 ?Pt. Presents for follow up. She was last seen 11/21/2021 for cough and asthma flare.  We discussed starting Breo, but we did not have Breo for her to try. She presents today and states she has had a hard time with her allergies, worsening cough, some shortness of breath. Wheezing, and cough is generally non productive. She has been having to use her rescue inhaler about 2-3 times a day, and on weekends about 5-6 times a day when she is exercising. She has used inhalers in the past but has had issues with oral thrush, so she would rather not use an inhaler if possible. I told her we will try a prednisone taper and if this does not work we will add an inhaler. She states the allergy medication ( Non -sedating ) cause dizziness, so she is unable to use these. She is allergic to Codeine so cough medication is limited, and she does not like taking them. She is using OTC cough medications. She does use Flonase for her post nasal drip.  ? ?She has been unable to use her CPAP much while she is flaring.  ? ?Test Results: ?FENO is 10 ppb ? ? ?  Latest Ref Rng & Units 01/10/2022  ?  1:46 PM 12/14/2021  ?  8:26 AM 12/19/2020  ?  8:35 AM  ?CBC  ?WBC 3.4 - 10.8 x10E3/uL 6.8   6.8   7.3    ?Hemoglobin 11.1 - 15.9 g/dL 42.3   53.6   14.4    ?Hematocrit 34.0 - 46.6 % 46.5   43.8   44.1    ?Platelets 150 - 450 x10E3/uL 314   272   253    ? ? ? ?  Latest Ref Rng & Units 01/10/2022  ?  1:46 PM 12/14/2021  ?  8:26 AM 12/19/2020  ?  8:35 AM  ?BMP  ?Glucose 70 - 99 mg/dL 75   79   80    ?BUN 6 - 24 mg/dL 10   10   10     ?Creatinine 0.57 - 1.00 mg/dL   3.15    4.00    ?BUN/Creat Ratio 9 - 23 10   9   8     ?Sodium 134 - 144 mmol/L 143   142   142    ?Potassium 3.5 - 5.2 mmol/L 4.8   4.4   5.1    ?Chloride 96 - 106 mmol/L 105   103   104    ?CO2 20 - 29 mmol/L 23   24   23     ?Calcium 8.7 - 10.2 mg/dL 8.67   9.6   9.6    ? ? ?BNP ?No results found for: BNP ? ?ProBNP ?No results found for: PROBNP ? ?PFT ?   ?Component Value Date/Time  ? FEV1PRE 3.04 07/06/2021 1037  ? FEV1POST 3.01 07/06/2021 1037  ? FVCPRE 3.68 07/06/2021 1037  ? FVCPOST 3.59 07/06/2021 1037  ? TLC 5.11 07/06/2021 1037  ? DLCOUNC 21.11 07/06/2021 1037  ? PREFEV1FVCRT 83 07/06/2021 1037  ? PSTFEV1FVCRT 84 07/06/2021 1037  ? ? ?No results  found. ? ? ?Past medical hx ?Past Medical History:  ?Diagnosis Date  ? Anemia   ? Asthma   ? sports induced inhalers  ? Bipolar 1 disorder (HCC)   ? Conversion disorder   ? Insomnia   ? Migraines   ? migraines  ? MRSA infection 2010  ? Ovarian cyst   ? left 2.5cm  ? Sleep apnea   ? does not use a CPAP  ? Vitamin B 12 deficiency   ? Vitamin D deficiency   ?  ? ?Social History  ? ?Tobacco Use  ? Smoking status: Former  ?  Packs/day: 1.00  ?  Years: 10.00  ?  Pack years: 10.00  ?  Types: Cigarettes  ?  Quit date: 09/25/2003  ?  Years since quitting: 18.3  ? Smokeless tobacco: Never  ?Vaping Use  ? Vaping Use: Never used  ?Substance Use Topics  ? Alcohol use: Yes  ?  Alcohol/week: 0.0 - 1.0 standard drinks  ?  Comment: pt states she is a social drinker  ? Drug use: No  ? ? ?Ms.Emily [name] reports that she quit smoking about 18 years ago. Her smoking use included cigarettes. She has a 10.00 pack-year smoking history. She has never used smokeless tobacco. She reports current alcohol use. She reports that she does not use drugs. ? ?Tobacco Cessation: ?Former smoker quit 2005 ? ? ?Past surgical hx, Family hx, Social hx all reviewed. ? ?Current Outpatient Medications on File Prior to Visit  ?Medication Sig  ? albuterol (PROVENTIL) (2.5 MG/3ML) 0.083% nebulizer solution Take 3 mLs (2.5 mg  total) by nebulization every 6 (six) hours as needed for wheezing or shortness of breath.  ? B-D UF III MINI PEN NEEDLES 31G X 5 MM MISC 1 Units by Does not apply route daily.  ? buPROPion (WELLBUTRIN XL) 300 MG 24 hr tablet Take 1 tablet (300 mg total) by mouth daily.  ? fluticasone (FLONASE) 50 MCG/ACT nasal spray Place 2 sprays into both nostrils daily as needed for allergies or rhinitis.  ? levalbuterol (XOPENEX) 0.63 MG/3ML nebulizer solution Take 3 mLs (0.63 mg total) by nebulization 3 (three) times daily as needed for wheezing or shortness of breath.  ? scopolamine (TRANSDERM-SCOP) 1 MG/3DAYS Place 1 patch (1.5 mg total) onto the skin every 3 (three) days.  ? vitamin B-12 (CYANOCOBALAMIN) 1000 MCG tablet Take 1,000 mcg by mouth every other day.  ? ibuprofen (ADVIL) 800 MG tablet Take 1 tablet (800 mg total) by mouth every 8 (eight) hours as needed. (Patient not taking: Reported on 01/19/2022)  ? ?No current facility-administered medications on file prior to visit.  ?  ? ?Allergies  ?Allergen Reactions  ? Codeine Diarrhea and Nausea And Vomiting  ? ? ?Review Of Systems: ? ?Constitutional:   No  weight loss, night sweats,  Fevers, chills, fatigue, or  lassitude. ? ?HEENT:   No headaches,  Difficulty swallowing,  Tooth/dental problems, or  Sore throat,  ?              No sneezing, itching, ear ache, nasal congestion, post nasal drip,  ? ?CV:  No chest pain,  Orthopnea, PND, swelling in lower extremities, anasarca, dizziness, palpitations, syncope.  ? ?GI  No heartburn, indigestion, abdominal pain, nausea, vomiting, diarrhea, change in bowel habits, loss of appetite, bloody stools.  ? ?Resp: + shortness of breath with exertion or at rest.  Minimal  excess mucus, no productive cough,  + non-productive cough,  No coughing up of blood.  No change in color of mucus.  ++ wheezing.  No chest wall deformity ? ?Skin: no rash or lesions. ? ?GU: no dysuria, change in color of urine, no urgency or frequency.  No flank pain,  no hematuria  ? ?MS:  No joint pain or swelling.  No decreased range of motion.  No back pain. ? ?Psych:  No change in mood or affect. No depression or anxiety.  No memory loss. ? ? ?Vital Signs ?BP 128/74 (BP Location: Right Arm, Patient Position: Sitting, Cuff Size: Large)   Pulse 73   Temp 98.6 ?F (37 ?C) (Oral)   Ht 5\' 8"  (1.727 m)   Wt 265 lb (120.2 kg)   LMP 06/01/2015 (Within Days)   SpO2 98%   BMI 40.29 kg/m?  ? ? ?Physical Exam: ? ?General- No distress,  A&Ox3, pleasant ?ENT: No sinus tenderness, TM clear, pale nasal mucosa, no oral exudate,no post nasal drip, no LAN ?Cardiac: S1, S2, regular rate and rhythm, no murmur ?Chest: No wheeze/ rales/ dullness; no accessory muscle use, no nasal flaring, no sternal retractions, diminished per bases ?Abd.: Soft Non-tender, ND, BS +, Body mass index is 40.29 kg/m?. ?Ext: No clubbing cyanosis, edema ?Neuro:  normal strength, MAE x 4, A&O x 3, appropriate ?Skin: No rashes, warm and dry, No lesions  ?Psych: normal mood and behavior ? ? ?Assessment/Plan ?Asthma Flare vs Upper Airway Cough due to PND 2/2 Allergies ?Favor the later as FENO was 10 ppb today ?We will send in a prednisone taper ?Prednisone taper; 10 mg tablets: 4 tabs x 2 days, 3 tabs x 2 days, 2 tabs x 2 days 1 tab x 2 days then stop.  ?If you continue to have breakthrough symptoms, we will consider adding an inhaler ?Continue Rescue inhaler but no more than 4 times a day. ?If you need it more than that, please call to be seen earlier ?Continue using Delsym for cough/ Robitussin  ?Consider Nasonex nasal spray at follow up.  ?Video Visit in 2 weeks with 08/01/2015 NP or Dr. Maralyn Sago to evaluate how you are after pred taper.  ? ?OSA on  CPAP ?Plan ?Continue on CPAP at bedtime. You appear to be benefiting from the treatment  ?Goal is to wear for at least 6 hours each night for maximal clinical benefit. ?Continue to work on weight loss, as the link between excess weight  and sleep apnea is well established.    ?Remember to establish a good bedtime routine, and work on sleep hygiene.  ?Limit daytime naps , avoid stimulants such as caffeine and nicotine close to bedtime, exercise daily to promote sleep quality, avoid h

## 2022-02-06 ENCOUNTER — Encounter: Payer: Self-pay | Admitting: Skilled Nursing Facility1

## 2022-02-06 ENCOUNTER — Encounter: Payer: Self-pay | Admitting: Nurse Practitioner

## 2022-02-06 ENCOUNTER — Encounter: Payer: Commercial Managed Care - PPO | Attending: Surgery | Admitting: Skilled Nursing Facility1

## 2022-02-06 DIAGNOSIS — E669 Obesity, unspecified: Secondary | ICD-10-CM | POA: Diagnosis present

## 2022-02-06 NOTE — Progress Notes (Signed)
Supervised Weight Loss Visit ?Bariatric Nutrition Education ? ?Planned Surgery: sleeve gastrectomy   ?Pt Expectation of Surgery/ Goals: to lose weight ? ?5 out of 6 [facility] Appointments  ? ?NUTRITION ASSESSMENT ? ?Anthropometrics  ?Start weight at [facility]: 252 lbs (date: 09/11/2021)  ?Weight: 267.1 pounds ?BMI: 40.75 kg/m2   ?  ?Clinical  ?Medical hx: sleep apnea, bipolar, IBS ?Medications: see list ?Labs: LDL 134, creatinine 1.13, M35 873 ?Notable signs/symptoms: constipation  ?Any previous deficiencies? Yes; vitamin D, vitamin B12, iron  ? ?Lifestyle & Dietary Hx ? ?Pt states [place] trip went well.  Stating she was the only sober person in her group. ?Pt states she has kidney stones. ?Pt states she used to snack all the time, stating she is working on having "a" snack instead of snacking all the time. ?Pt states a cup of raw vegetables gets her through the rest of the day. ?Pt states she is trying to train herself before surgery, with lifestyle changes now. ?Pt states she is not taking surgery lightly. ?Pt states she has a great support system. ?Pt states her gym will help her post surgery and cleared for activity after surgery.  ?Pt states she is using resistance bands. ?Pt states she foot is bothering her still from [place] trip.  States she has a doctors appointment for it soon. ? ?Estimated daily fluid intake: 120 oz ?Supplements: vitamin E, b12, D ?Current average weekly physical activity: resistance bands ? ?24-Hr Dietary Recall ?First Meal: green tea with oatmeal or protein shake ?Snack: raw vegetables ?Second Meal: salad with chicken or tuna with vinaggerrett + lettuce + onions + carrots + cucumbers + tomato or subway salad with sweet chicken teriyaki  ?Snack: carrots or celery sometimes with ranch or hummus ?Third Meal: meal company: chicken + root veggies + carrots and onions or salad with chicken on top or chicken + sticky rice + sauteed red bell peppers + onions + sugar snap peas ?Snack:  ?Beverages: water,  green tea + monk fruit + 1tsp sugar coffee + 1tsp sugar + milk, sometimes soda or sparkling water ? ?Estimated Energy Needs ?Calories: 1500 ? ? ?NUTRITION DIAGNOSIS  ?Overweight/obesity ([place]-3.3) related to past poor dietary habits and physical inactivity as evidenced by patient w/ planned sleeve gastrectomy surgery following dietary guidelines for continued weight loss. ? ? ?NUTRITION INTERVENTION  ?Nutrition counseling (C-1) and education (E-2) to facilitate bariatric surgery goals. ? ? ? ?Pre-Op Goals Progress & New Goals: continued  ?continue: aim to not drink 15 minutes before your meal, not with your meal, and wait until 30 minutes after ?continue: log your food and beverages with the app you like (loseit) (states she keeps forgetting)  ?Continue: Non-starchy vegetables with every meal ? ?Handouts Provided Include  ?N/A ? ?Learning Style & Readiness for Change ?Teaching method utilized: Visual & Auditory  ?Demonstrated degree of understanding via: Teach Back  ?Readiness Level: contemplative  ?Barriers to learning/adherence to lifestyle change: none identified  ? ?RD's Notes for next Visit  ?Assess pts adherence to chosen goals ? ? ?MONITORING & EVALUATION ?Dietary intake, weekly physical activity, body weight, and pre-op goals in 1 month.  ? ?Next Steps  ?Patient is to return to [facility] in 1 month ?

## 2022-02-07 ENCOUNTER — Ambulatory Visit (INDEPENDENT_AMBULATORY_CARE_PROVIDER_SITE_OTHER): Payer: Commercial Managed Care - PPO | Admitting: Nurse Practitioner

## 2022-02-07 ENCOUNTER — Encounter: Payer: Self-pay | Admitting: Nurse Practitioner

## 2022-02-07 VITALS — BP 123/86 | HR 90 | Temp 98.4°F | Ht 67.99 in | Wt 267.8 lb

## 2022-02-07 DIAGNOSIS — M79671 Pain in right foot: Secondary | ICD-10-CM

## 2022-02-07 NOTE — Progress Notes (Signed)
BP 123/86   Pulse 90   Temp 98.4 F (36.9 C) (Oral)   Ht 5' 7.99" (1.727 m)   Wt 267 lb 12.8 oz (121.5 kg)   LMP 06/01/2015 (Within Days)   SpO2 97%   BMI 40.73 kg/m    Subjective:    Patient ID: Emily [name], female    DOB: [birth year], 41 y.o.   MRN: [id number]  HPI: Emily [name] is a 41 y.o. female  Chief Complaint  Patient presents with   Referral    Patient was referral for her right foot   Patient states the swelling is pretty much gone since our last visit.  Now she is having constant pain 5/10.  Does go up to an 8/9 depending on what she does. Worsens with weight bearing, walking and movement.  [facility] and was told she has achilles tendonitis.  However, the pain goes around to the front.  She was given pain cream and ibuprofen.  She has not been taking the ibuprofen but the pain cream helps her sleep.       Relevant past medical, surgical, family and social history reviewed and updated as indicated. Interim medical history since our last visit reviewed. Allergies and medications reviewed and updated.  Review of Systems  Respiratory:  Negative for cough and shortness of breath.   Cardiovascular:  Negative for chest pain and leg swelling.  Musculoskeletal:        Right foot pain   Per HPI unless specifically indicated above     Objective:    BP 123/86   Pulse 90   Temp 98.4 F (36.9 C) (Oral)   Ht 5' 7.99" (1.727 m)   Wt 267 lb 12.8 oz (121.5 kg)   LMP 06/01/2015 (Within Days)   SpO2 97%   BMI 40.73 kg/m   Wt Readings from Last 3 Encounters:  02/07/22 267 lb 12.8 oz (121.5 kg)  02/06/22 267 lb 1.6 oz (121.2 kg)  01/19/22 265 lb (120.2 kg)    Physical Exam Vitals and nursing note reviewed.  Constitutional:      General: She is not in acute distress.    Appearance: Normal appearance. She is normal weight. She is not ill-appearing, toxic-appearing or diaphoretic.  HENT:     Head: Normocephalic.     Right Ear: External  ear normal.     Left Ear: External ear normal.     Nose: Nose normal.     Mouth/Throat:     Mouth: Mucous membranes are moist.     Pharynx: Oropharynx is clear.  Eyes:     General:        Right eye: No discharge.        Left eye: No discharge.     Extraocular Movements: Extraocular movements intact.     Conjunctiva/sclera: Conjunctivae normal.     Pupils: Pupils are equal, round, and reactive to light.  Cardiovascular:     Rate and Rhythm: Normal rate and regular rhythm.     Heart sounds: No murmur heard. Pulmonary:     Effort: Pulmonary effort is normal. No respiratory distress.     Breath sounds: Normal breath sounds. No wheezing or rales.  Musculoskeletal:     Cervical back: Normal range of motion and neck supple.     Right lower leg: No edema.     Left lower leg: No edema.     Right foot: Tenderness present. No swelling (trace edema).  Left foot: No swelling (trace edema).     Comments: Pain with palpation of right foot around lateral malleolus  Skin:    General: Skin is warm and dry.     Capillary Refill: Capillary refill takes less than 2 seconds.  Neurological:     General: No focal deficit present.     Mental Status: She is alert and oriented to person, place, and time. Mental status is at baseline.  Psychiatric:        Mood and Affect: Mood normal.        Behavior: Behavior normal.        Thought Content: Thought content normal.        Judgment: Judgment normal.    Results for orders placed or performed in visit on 01/19/22  POCT EXHALED NITRIC OXIDE  Result Value Ref Range   FeNO level (ppb) 10       Assessment & Plan:   Problem List Items Addressed This Visit   None Visit Diagnoses     Right foot pain    -  Primary   Will refer to Ortho.  Xray at urgent negative. Can use Ibuprofen PRN.    Relevant Orders   Ambulatory referral to Orthopedics        Follow up plan: Return if symptoms worsen or fail to improve.

## 2022-02-09 ENCOUNTER — Encounter: Payer: Self-pay | Admitting: Acute Care

## 2022-02-09 ENCOUNTER — Telehealth (INDEPENDENT_AMBULATORY_CARE_PROVIDER_SITE_OTHER): Payer: Commercial Managed Care - PPO | Admitting: Acute Care

## 2022-02-09 DIAGNOSIS — R059 Cough, unspecified: Secondary | ICD-10-CM | POA: Diagnosis not present

## 2022-02-09 DIAGNOSIS — J45909 Unspecified asthma, uncomplicated: Secondary | ICD-10-CM

## 2022-02-09 MED ORDER — PREDNISONE 10 MG PO TABS: ORAL_TABLET | ORAL | 0 refills | Status: DC

## 2022-02-09 MED ORDER — PULMICORT 0.5 MG/2ML IN SUSP: 0.5000 mg | Freq: Two times a day (BID) | RESPIRATORY_TRACT | 12 refills | Status: DC

## 2022-02-09 NOTE — Progress Notes (Signed)
Virtual Visit via Video Note  I connected with Emily [name] on 02/09/22 at  9:30 AM EDT by a video enabled telemedicine application and verified that I am speaking with the correct person using two identifiers.  Location: Patient:  At home Provider:  [address], [place], [place], [address]    I discussed the limitations of evaluation and management by telemedicine and the availability of in person appointments. The patient expressed understanding and agreed to proceed.   Emily [name] is a 41 y.o. female former smoker ( Quit 2005) with OSA on CPAP therapy, asthma and dyspnea on exertion since Covid diagnoses 12/21and again 05/2021. She did not require hospitalization. She is followed by Dr. Elsworth Soho. Pt. Does not take maintenance therapy for her asthma.  Allergies and asthma flares do affect her CPAP compliance   Synopsis of previous OV on 01/19/2022 Pt. Presented 11/21/2021 for cough and asthma flare.  We discussed starting Breo, but we did not have Breo samples for her to try that day. She presented 01/19/2022 for follow up and and stated she had had a hard time with her allergies, worsening cough, some shortness of breath. Wheezing, and cough were  generally non productive. She had been having to use her rescue inhaler about 2-3 times a day, and on weekends about 5-6 times a day when she was exercising. She has used inhalers in the past but has had issues with oral thrush, so she would rather not use an inhaler if possible. I told her we will try a prednisone taper and if this does not work we will add an inhaler. She states the allergy medications ( Non -sedating ) cause dizziness, so she is unable to use these. She is allergic to Codeine so cough medication options are limited, and she does not like taking them. She is using OTC cough medications. She does use Flonase for her post nasal drip. I suspect there is a significant allergic component to this flare.    She had been unable to use  her CPAP much while she is flaring.FENO at last OV was 10 ppb . She injured herself ( foot)  and is having to sleep on her stomach, so has used CPAP only 7 of the last 30 days. Once she gets a brace that should be able to sleep on her back, and resume her CPAP use.     History of Present Illness: Pt. Presents for follow up. She states she completed her prednisone taper. She feels she did well while she was on the taper, but as soon as she came off the taper, she had developed the cough again. She does not want to use inhalers as she says they tear up her throat, even with spacers.  Cough is mostly non productive, but when it is productive sputum is clear to white. We can try longer acting neb treatments to see if she tolerated these better than the inhalers. She is generally not wheezing. She does have an allergy to Codeine that limits her options for cough medication. She is using Delsym at night for sleep. She is using her rescue inhaler 3-4 times daily for shortness of breath. She has not been able to exercise.    Observations/Objective: Pt. Is coughing, no audible wheezing.  She is ion no distress  Discussion: I believe we are dealing with an upper airway cough triggered by PND/ allergies in a patient who cannot take non-sedating antihistamines, cough medication  with codeine or use  ICS inhalers. We will try corticosteroid nebs and another pred taper. I have asked her to implement cough precautions. I will also refer to allergy to see if there is an option for desensitization injections .    Assessment and Plan: Slow to resolve Asthma Flare vs upper airway cough due to PND 2/2 allergies Better/ Worse after prednisone Plan We will repeat the prednisone taper Prednisone taper; 10 mg tablets: 4 tabs x 2 days, 3 tabs x 2 days, 2 tabs x 2 days 1 tab x 2 days then stop.  Continue Rescue inhaler but no more than 4 times a day. I will send in a prescription for nebulizer treatments that last longer  than your albuterol.  Pulmicort, use in the morning and evening Rinse mouth after use Continue using Delsym for cough/ Robitussin  Consider Nasonex/ Flonase  nasal spray   We will send a referral to Dr. Annamaria Helling for allergies.  Sips of water instead of throat clearing Sugar Free [employer] or [employer] originals for throat soothing. Delsym Cough syrup 5 cc's every 12 hours Non-sedating antihistamine of your choice daily ( Zyrtec, Allegra, Xyzol, Claritin ( Generic ok)  Follow up in 6 weeks Call if you need to be seen sooner.    OSA on  CPAP She has only worn her CPAP 7 of the last 30 nights.>> Having to sleep on stomach due to foot injury Well controlled when she wears it. AHI is 1.8 Plan Continue on CPAP at bedtime. You appear to be benefiting from the treatment  Please pick up the foot brace that will allow you to sleep on your back so you can resume CPAP therapy Goal is to wear for at least 6 hours each night for maximal clinical benefit. Continue to work on weight loss, as the link between excess weight  and sleep apnea is well established.   Remember to establish a good bedtime routine, and work on sleep hygiene.  Limit daytime naps , avoid stimulants such as caffeine and nicotine close to bedtime, exercise daily to promote sleep quality, avoid heavy , spicy, fried , or rich foods before bed. Ensure adequate exposure to natural light during the day,establish a relaxing bedtime routine with a pleasant sleep environment ( Bedroom between 60 and 67 degrees, turn off bright lights , TV or device screens screens , consider black out curtains or white noise machines) Do not drive if sleepy. Remember to clean mask, tubing, filter, and reservoir once weekly with soapy water.  Follow up in 6 weeks    I spent 40 minutes dedicated to the care of this patient on the date of this encounter to include pre-visit review of records, face-to-face time with the patient discussing conditions above,  post visit ordering of testing, clinical documentation with the electronic health record, making appropriate referrals as documented, and communicating necessary information to the patient's healthcare team.       Follow Up Instructions: Follow up in 6 weeks ion office   I discussed the assessment and treatment plan with the patient. The patient was provided an opportunity to ask questions and all were answered. The patient agreed with the plan and demonstrated an understanding of the instructions.   The patient was advised to call back or seek an in-person evaluation if the symptoms worsen or if the condition fails to improve as anticipated.  I provided 40 minutes of non-face-to-face time during this encounter.   Magdalen Spatz, NP  02/09/2022

## 2022-02-09 NOTE — Patient Instructions (Addendum)
It was good to talk with you today.  We will repeat the prednisone taper Prednisone taper; 10 mg tablets: 4 tabs x 2 days, 3 tabs x 2 days, 2 tabs x 2 days 1 tab x 2 days then stop.  Continue Rescue inhaler but no more than 4 times a day. I will send in a prescription for nebulizer treatments that last longer than your albuterol.  Pulmicort, use in the morning and evening Rinse mouth after use Continue using Delsym for cough/ Robitussin  Consider Nasonex/ Flonase  nasal spray   We will send a referral to Dr. Duaine Dredge for allergies.  Sips of water instead of throat clearing Sugar Free [employer] or [employer] originals for throat soothing. Delsym Cough syrup 5 cc's every 12 hours Non-sedating antihistamine of your choice daily ( Zyrtec, Allegra, Xyzol, Claritin ( Generic ok)  Follow up in 6 weeks Call if you need to be seen sooner.   Continue on CPAP at bedtime. You appear to be benefiting from the treatment  Please pick up the foot brace that will allow you to sleep on your back so you can resume CPAP therapy Goal is to wear for at least 6 hours each night for maximal clinical benefit. Continue to work on weight loss, as the link between excess weight  and sleep apnea is well established.   Remember to establish a good bedtime routine, and work on sleep hygiene.  Limit daytime naps , avoid stimulants such as caffeine and nicotine close to bedtime, exercise daily to promote sleep quality, avoid heavy , spicy, fried , or rich foods before bed. Ensure adequate exposure to natural light during the day,establish a relaxing bedtime routine with a pleasant sleep environment ( Bedroom between 60 and 67 degrees, turn off bright lights , TV or device screens screens , consider black out curtains or white noise machines) Do not drive if sleepy. Remember to clean mask, tubing, filter, and reservoir once weekly with soapy water.  Follow up in 6 weeks

## 2022-03-12 ENCOUNTER — Encounter: Payer: Commercial Managed Care - PPO | Attending: Surgery | Admitting: Skilled Nursing Facility1

## 2022-03-12 ENCOUNTER — Encounter: Payer: Self-pay | Admitting: Skilled Nursing Facility1

## 2022-03-12 DIAGNOSIS — Z6839 Body mass index (BMI) 39.0-39.9, adult: Secondary | ICD-10-CM | POA: Insufficient documentation

## 2022-03-12 DIAGNOSIS — E6609 Other obesity due to excess calories: Secondary | ICD-10-CM | POA: Insufficient documentation

## 2022-03-12 NOTE — Progress Notes (Signed)
Supervised Weight Loss Visit Bariatric Nutrition Education  Planned Surgery: sleeve gastrectomy   Pt Expectation of Surgery/ Goals: to lose weight  6 out of 6 SWL Appointments   Pt completed visits.   Pt has cleared nutrition requirements.    NUTRITION ASSESSMENT  Anthropometrics  Start weight at [facility]: 252 lbs (date: 09/11/2021)  Weight: 274 pounds BMI: 41.66 kg/m2     Clinical  Medical hx: sleep apnea, bipolar, IBS Medications: see list Labs: LDL 134, creatinine 1.13, O17 873 Notable signs/symptoms: constipation  Any previous deficiencies? Yes; vitamin D, vitamin B12, iron   Lifestyle & Dietary Hx  Pt state she has an upcoming MRI for her foot which is still in pain.   Pt states in the last 6 months she has learned to be mindful what she is eating, how much she is eating, to increase her water, and not to drink with her meals stating an overall mindfulness.    Pt states she realizes she will really have to focus in on drinking throughout the day not just chugging it all at once.   Estimated daily fluid intake: 120 oz Supplements: vitamin E, b12, D Current average weekly physical activity: resistance bands  24-Hr Dietary Recall First Meal: hot green tea with scrambled eggs or sausage or bacon + 1 slice whole wheat bread  Snack: raw vegetables Second Meal: green chicken salad + balsamic or Cesar chicken salad  Snack: carrots or celery sometimes with ranch or hummus Third Meal: meal company: chicken + root veggies + carrots and onions or salad with chicken on top or chicken + sticky rice + sauteed red bell peppers + onions + sugar snap peas or chicken fried rice or lean cuisine frozen meal Snack:  Beverages: water, green tea + monk fruit + 1tsp sugar coffee + 1tsp sugar + milk, sometimes soda or sparkling water  Estimated Energy Needs Calories: 1500   NUTRITION DIAGNOSIS  Overweight/obesity ([place]-3.3) related to past poor dietary habits and physical inactivity as  evidenced by patient w/ planned sleeve gastrectomy surgery following dietary guidelines for continued weight loss.   NUTRITION INTERVENTION  Nutrition counseling (C-1) and education (E-2) to facilitate bariatric surgery goals.    Pre-Op Goals Progress & New Goals: continued  continue: aim to not drink 15 minutes before your meal, not with your meal, and wait until 30 minutes after continue: log your food and beverages with the app you like (loseit) (states she keeps forgetting)  Continue: Non-starchy vegetables with every meal  Handouts Provided Include  N/A  Learning Style & Readiness for Change Teaching method utilized: Visual & Auditory  Demonstrated degree of understanding via: Teach Back  Readiness Level: contemplative  Barriers to learning/adherence to lifestyle change: none identified   RD's Notes for next Visit  Assess pts adherence to chosen goals   MONITORING & EVALUATION Dietary intake, weekly physical activity, body weight, and pre-op goals   Next Steps  Patient is to return to [facility] for pre-op class Pt has completed visits. No further supervised visits required/recomended

## 2022-04-06 NOTE — Progress Notes (Unsigned)
NEW PATIENT Date of Service/Encounter:  04/09/22 Referring provider: Bevelyn Ngo, NP Primary care provider: Larae Grooms, NP  Subjective:  Emily [name] is a 41 y.o. female with a PMHx of former smoker, OSA on CPAP, asthma presenting today for evaluation of chronic rhinitis and concern for food allergy History obtained from: chart review and patient.   Chronic rhinitis: started after her first child, in her early 20's Symptoms include: rhinorrhea and post nasal drainage  Occurs seasonally-winter to spring, fall to winter Treatments tried: flonase PRN Previous allergy testing:  yes- many years ago, thinks grasses and trees were positive, dog History of reflux/heartburn: no She does have 6 dogs, [place].  Has cats, cows, ducks, geese, chicken, quail.  Most live outside except baby birds live in basement when they are little.    She has had COVID twice (2021, 2022) and her breathing issues have gotten worse each time.  She is using her rescue inhaler 3-4 times per day, sometimes up to 8-9 times per day.  She has not been able to tolerate steroid inhaler-throat gets swollen, coughs up blood.  She does get relief when using the rescue inhaler.  Still has some pressure on her chest.  Prednisone helps with breathing, but she hates the way she feels when on it due to being bipolar on wellbutrin.  She is using the pulmicort 0.5 mg BID, and it helps some, but it is also starting to irritate her. She does not like taking antihistamines because they make her gain weight. She has had a normal cardiac eval.  Last echo 08/04/21.   She does have OSA with CPAP but unable to use due to coughing fits.   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  Was training for a half-marathon since having covid.  However, breathing significantly declined and she has no longer pursuing this goal.  Patient is followed by [facility] for asthma and chronic cough following COVID-19 infection  2021 Per chart review-video visit on 02/09/2022 for cough and asthma. "FENO at last OV was 10 ppb ".  Improved on prednisone taper. Assessment: "believe we are dealing with an upper airway cough triggered by PND/ allergies in a patient who cannot take non-sedating antihistamines, cough medication  with codeine or use ICS inhalers. We will try corticosteroid nebs and another pred taper. I have asked her to implement cough precautions. I will also refer to allergy to see if there is an option for desensitization injections "  Food allergy concerns: agave makes her vomit Seafood-both fish and shellfish, makes her stomach upset and feels "lethargic" or "blah", no vomiting-occurs within 30 minutes of ingestion Lamb-upset stomach, nausea, within 15-20 minutes of eating Artificial sweeteners in general make her sick to her stomach.  Other allergy screening: Medication allergy:  codeine Hymenoptera allergy: no Urticaria: no Eczema:no History of recurrent infections suggestive of immunodeficency: no Vaccinations are up to date.   Past Medical History: Past Medical History:  Diagnosis Date   Anemia    Asthma    sports induced inhalers   Bipolar 1 disorder (HCC)    Conversion disorder    Insomnia    Migraines    migraines   MRSA infection 2010   Ovarian cyst    left 2.5cm   Sleep apnea    does not use a CPAP   Vitamin B 12 deficiency    Vitamin D deficiency    Medication List:  Current Outpatient Medications  Medication Sig  Dispense Refill   albuterol (PROVENTIL) (2.5 MG/3ML) 0.083% nebulizer solution Take 3 mLs (2.5 mg total) by nebulization every 6 (six) hours as needed for wheezing or shortness of breath. 75 mL 12   B-D UF III MINI PEN NEEDLES 31G X 5 MM MISC 1 Units by Does not apply route daily. 100 each 0   buPROPion (WELLBUTRIN XL) 300 MG 24 hr tablet Take 1 tablet (300 mg total) by mouth daily. 90 tablet 1   Diclofenac Sodium 3 % GEL SMARTSIG:1 Gram(s) Topical Twice Daily PRN      EPINEPHrine 0.3 mg/0.3 mL IJ SOAJ injection Inject 0.3 mg into the muscle as needed for anaphylaxis. 1 each 2   fluticasone (FLONASE) 50 MCG/ACT nasal spray Place 2 sprays into both nostrils daily as needed for allergies or rhinitis.     ibuprofen (ADVIL) 800 MG tablet Take 1 tablet (800 mg total) by mouth every 8 (eight) hours as needed. 30 tablet 0   levalbuterol (XOPENEX) 0.63 MG/3ML nebulizer solution Take 3 mLs (0.63 mg total) by nebulization 3 (three) times daily as needed for wheezing or shortness of breath. 270 mL 12   PULMICORT 0.5 MG/2ML nebulizer solution Take 2 mLs (0.5 mg total) by nebulization 2 (two) times daily. 60 mL 12   scopolamine (TRANSDERM-SCOP) 1 MG/3DAYS Place 1 patch (1.5 mg total) onto the skin every 3 (three) days. 10 patch 12   vitamin B-12 (CYANOCOBALAMIN) 1000 MCG tablet Take 1,000 mcg by mouth every other day.     No current facility-administered medications for this visit.   Known Allergies:  Allergies  Allergen Reactions   Codeine Diarrhea and Nausea And Vomiting   Past Surgical History: Past Surgical History:  Procedure Laterality Date   BUNIONECTOMY     ESOPHAGOGASTRODUODENOSCOPY (EGD) WITH PROPOFOL N/A 11/03/2018   Procedure: ESOPHAGOGASTRODUODENOSCOPY (EGD) WITH PROPOFOL;  Surgeon: Wyline Mood, MD;  Location: [facility] ENDOSCOPY;  Service: Gastroenterology;  Laterality: N/A;   HAMMER TOE SURGERY     HAND SURGERY  09/24/2012   cyst removed   INTRAUTERINE DEVICE (IUD) INSERTION N/A 05/09/2015   Procedure: INTRAUTERINE DEVICE (IUD) INSERTION;  Surgeon: Hildred Laser, MD;  Location: [facility] ORS;  Service: Gynecology;  Laterality: N/A;   LAPAROSCOPIC ASSISTED VAGINAL HYSTERECTOMY N/A 06/20/2015   Procedure: LAPAROSCOPIC ASSISTED VAGINAL HYSTERECTOMY, BILATERAL SALPINGECTOMY, CYSTOSCOPY, IUD REMOVAL ;  Surgeon: Hildred Laser, MD;  Location: [facility] ORS;  Service: Gynecology;  Laterality: N/A;   LAPAROSCOPIC TUBAL LIGATION Bilateral 05/09/2015   Procedure:  LAPAROSCOPIC TUBAL LIGATION;  Surgeon: Hildred Laser, MD;  Location: [facility] ORS;  Service: Gynecology;  Laterality: Bilateral;   NASAL SINUS SURGERY     TOTAL ABDOMINAL HYSTERECTOMY W/ BILATERAL SALPINGOOPHORECTOMY     TUBAL LIGATION  09/24/2005   Family History: Family History  Problem Relation Age of Onset   Bipolar disorder Mother    Anxiety disorder Mother    Cancer Mother        brain   Glaucoma Mother    Heart disease Father    Diabetes Father    Cancer Father    Bipolar disorder Sister    Schizophrenia Sister    Breast cancer Brother    Breast cancer Maternal Aunt    Cancer Maternal Grandmother        lung   Heart disease Maternal Grandfather    Ovarian cancer Neg Hx    Social History: Keyna lives in a house built 50 years ago, tile floors, electric and gas heating, central AC, [place] farm with  dogs and cats indoors, cows, ducks, chickens, [place] outdoors, using dust mite protection on the bedding and pillows, no smoke exposure.  She is exposed to dust at home.  No HEPA filter in the home.  [place]  Previous smoking history: 1998-2005, 0.5 PPD.   ROS:  All other systems negative except as noted per HPI.  Objective:  Blood pressure 118/78, pulse 78, temperature 97.6 F (36.4 C), temperature source Temporal, weight 274 lb (124.3 kg), last menstrual period 06/01/2015, SpO2 96 %. Body mass index is 41.66 kg/m. Physical Exam:  General Appearance:  Alert, cooperative, no distress, appears stated age  Head:  Normocephalic, without obvious abnormality, atraumatic  Eyes:  Conjunctiva clear, EOM's intact  Nose: Nares normal, hypertrophic turbinates, normal mucosa, and no visible anterior polyps  Throat: Lips, tongue normal; teeth and gums normal, normal posterior oropharynx  Neck: Supple, symmetrical  Lungs:   clear to auscultation bilaterally, Respirations unlabored, no coughing  Heart:  regular rate and rhythm and no murmur, Appears well  perfused  Extremities: No edema  Skin: Skin color, texture, turgor normal, no rashes or lesions on visualized portions of skin  Neurologic: No gross deficits     Diagnostics: Skin Testing: Environmental allergy panel and select foods.  Adequate controls. Results discussed with patient/family.  Airborne Adult Perc - 04/09/22 1015     Time Antigen Placed 0932    Allergen Manufacturer Waynette Buttery    Location Back    Number of Test 59    1. Control-Buffer 50% Glycerol Negative    2. Control-Histamine 1 mg/ml 3+    3. Albumin saline Negative    4. Bahia Negative    5. French Southern Territories Negative    6. Johnson Negative    7. Kentucky Blue Negative    8. Meadow Fescue Negative    9. Perennial Rye Negative    10. Sweet Vernal Negative    11. Timothy Negative    12. Cocklebur Negative    13. Burweed Marshelder Negative    14. Ragweed, short Negative    15. Ragweed, Giant Negative    16. Plantain,  English Negative    17. Lamb's Quarters Negative    18. Sheep Sorrell Negative    19. Rough Pigweed Negative    20. Marsh Elder, Rough Negative    21. Mugwort, Common Negative    22. Ash mix Negative    23. Birch mix Negative    24. Beech American Negative    25. Box, Elder Negative    26. Cedar, red Negative    27. Cottonwood, Guinea-Bissau Negative    28. Elm mix Negative    29. Hickory Negative    30. Maple mix Negative    31. Oak, Guinea-[place]issau mix Negative    32. Pecan Pollen Negative    33. Pine mix Negative    34. Sycamore Eastern Negative    35. Walnut, Black Pollen Negative    36. Alternaria alternata Negative    37. Cladosporium Herbarum Negative    38. Aspergillus mix Negative    39. Penicillium mix Negative    40. Bipolaris sorokiniana (Helminthosporium) Negative    41. Drechslera spicifera (Curvularia) Negative    42. Mucor plumbeus Negative    43. Fusarium moniliforme Negative    44. Aureobasidium pullulans (pullulara) Negative    45. Rhizopus oryzae Negative    46. Botrytis cinera  Negative    47. Epicoccum nigrum Negative    48. Phoma betae Negative  49. Candida Albicans Negative    50. Trichophyton mentagrophytes Negative    51. Mite, D Farinae  5,000 AU/ml Negative    52. Mite, D Pteronyssinus  5,000 AU/ml Negative    53. Cat Hair 10,000 BAU/ml Negative    54.  Dog Epithelia Negative    55. Mixed Feathers Negative    56. Horse Epithelia Negative    57. Cockroach, German Negative    58. Mouse Negative    59. Tobacco Leaf Negative             Intradermal - 04/09/22 1048     Time Antigen Placed 1023    Allergen Manufacturer Waynette Buttery    Location Arm    Number of Test 15    Control Negative    French Southern Territories Negative    Johnson 3+    7 Grass Negative    Ragweed mix Negative    Weed mix Negative    Tree mix Negative    Mold 1 3+    Mold 2 3+    Mold 3 3+    Mold 4 3+    Cat 3+    Dog 3+    Cockroach Negative    Mite mix 3+             Food Adult Perc - 04/09/22 1000     Time Antigen Placed 3546    Allergen Manufacturer Waynette Buttery    Location Back    Number of allergen test 23    1. Peanut Negative    2. Soybean Negative    3. Wheat Negative    4. Sesame Negative    5. Milk, cow Negative    6. Egg White, Chicken Negative    7. Casein Negative    8. Shellfish Mix Negative    9. Fish Mix Negative    10. Cashew Negative    18. Catfish Negative    19. Bass Negative    20. Trout Negative    21. Tuna Negative    22. Salmon Negative    23. Flounder Negative    24. Codfish Negative    25. Shrimp Negative    26. Crab Negative    27. Lobster Negative    28. Oyster Negative    29. Scallops Negative    41. Lamb Negative             Allergy testing results were read and interpreted by myself, documented by clinical staff.  Assessment and Plan  Patient with a history of chronic cough sent by pulmonary due to suspected upper airway cough.  Does have mostly perennial allergens on intradermal testing today.  We discussed adding allergy  injections as part of her regimen to see if this would help improve rhinitis symptoms, and subsequently cough.  Discussed that she will not begin to see benefit from this until reaching maintenance for some time.  We added Atrovent nasal spray to help with drainage and rhinorrhea.  She will continue to follow with pulmonary for her asthma and chronic cough symptoms. Additionally she has concerns for possible food allergies with negative testing today.  We will confirm this via blood work.    Chronic Rhinitis -seasonal and perennial allergic: - allergy testing today was negative on skin, + intradermals to [place], major and minor indoor and outdoor molds, cat, dog, and dust mites - allergen avoidance as below - consider allergy shots as long term control of your symptoms by teaching your immune system to be more  tolerant of your allergy triggers - Continue Nasal Steroid Spray: Options include Flonase (fluticasone), Nasocort (triamcinolone), Nasonex (mometasome) 2 sprays in each nostril daily (can buy over-the-counter if not covered by insurance)  Best results if used daily. - Start Atrovent (Ipratropium Bromide) 1-2 sprays in each nostril up to 3 times a day as needed for runny nose/post nasal drip/drainage.  Use less frequently if airway gets too dry.  Asthma and chronic cough:  - inhaled pulmicort 0.5 mg twice daily as prescribed by Pulmonary - albuterol 2 puffs every 4 to 6 hours as needed - continue follow-up with Pulmonary  Concern for Food allergy:  - today's skin testing was negative - please strictly avoid fish, shellfish, lamb, artificial sweeteners and agave; labs to confirm negative testing for seafood and lamb - for SKIN only reaction, okay to take Benadryl 2 capsules every 4 hours - for SKIN + ANY additional symptoms, OR IF concern for LIFE THREATENING reaction = Epipen Autoinjector EpiPen 0.3 mg. - If using Epinephrine autoinjector, call 911 - A food allergy action plan has been  provided and discussed. - Medic Alert identification is recommended.  Follow-up in 3 to 4 months, sooner if needed.  Make allergy injection appointment 3 weeks from now. It was a pleasure meeting you today!  This note in its entirety was forwarded to the Provider who requested this consultation.  Thank you for your kind referral. I appreciate the opportunity to take part in Neelah's care. Please do not hesitate to contact me with questions.  Sincerely,  Tonny BollmanErin Janeth Terry, MD [facility]

## 2022-04-09 ENCOUNTER — Ambulatory Visit (INDEPENDENT_AMBULATORY_CARE_PROVIDER_SITE_OTHER): Payer: Commercial Managed Care - PPO | Admitting: Internal Medicine

## 2022-04-09 ENCOUNTER — Encounter: Payer: Self-pay | Admitting: Internal Medicine

## 2022-04-09 VITALS — BP 118/78 | HR 78 | Temp 97.6°F | Wt 274.0 lb

## 2022-04-09 DIAGNOSIS — J310 Chronic rhinitis: Secondary | ICD-10-CM

## 2022-04-09 DIAGNOSIS — J302 Other seasonal allergic rhinitis: Secondary | ICD-10-CM | POA: Insufficient documentation

## 2022-04-09 DIAGNOSIS — J3089 Other allergic rhinitis: Secondary | ICD-10-CM

## 2022-04-09 DIAGNOSIS — T781XXA Other adverse food reactions, not elsewhere classified, initial encounter: Secondary | ICD-10-CM | POA: Insufficient documentation

## 2022-04-09 DIAGNOSIS — R053 Chronic cough: Secondary | ICD-10-CM | POA: Diagnosis not present

## 2022-04-09 MED ORDER — EPINEPHRINE 0.3 MG/0.3ML IJ SOAJ: 0.3000 mg | INTRAMUSCULAR | 2 refills | Status: DC | PRN

## 2022-04-09 MED ORDER — IPRATROPIUM BROMIDE 0.03 % NA SOLN: 1.0000 | Freq: Two times a day (BID) | NASAL | 3 refills | Status: DC | PRN

## 2022-04-09 NOTE — Patient Instructions (Addendum)
Chronic Rhinitis -seasonal and perennial allergic: - allergy testing today was negative on skin, + intradermals to [place], major and minor indoor and outdoor molds, cat, dog, and dust mites - allergen avoidance as below - consider allergy shots as long term control of your symptoms by teaching your immune system to be more tolerant of your allergy triggers - Continue Nasal Steroid Spray: Options include Flonase (fluticasone), Nasocort (triamcinolone), Nasonex (mometasome) 2 sprays in each nostril daily (can buy over-the-counter if not covered by insurance)  Best results if used daily. - Start Atrovent (Ipratropium Bromide) 1-2 sprays in each nostril up to 3 times a day as needed for runny nose/post nasal drip/drainage.  Use less frequently if airway gets too dry.  Asthma and chronic cough:  - inhaled pulmicort 0.5 mg twice daily as prescribed by Pulmonary - albuterol 2 puffs every 4 to 6 hours as needed - continue follow-up with Pulmonary  Concern for Food allergy: suspect intolerance, but will verify skin testing with blood work today. - today's skin testing was negative - please strictly avoid fish, shellfish, lamb, artificial sweeteners and agave; labs to confirm negative testing for seafood and lamb - for SKIN only reaction, okay to take Benadryl 2 capsules every 4 hours - for SKIN + ANY additional symptoms, OR IF concern for LIFE THREATENING reaction = Epipen Autoinjector EpiPen 0.3 mg. - If using Epinephrine autoinjector, call 911 - A food allergy action plan has been provided and discussed. - Medic Alert identification is recommended.  Follow-up in 3 to 4 months, sooner if needed.  Make allergy injection appointment 3 weeks from now. It was a pleasure meeting you today!  Control of Mold Allergen   Mold and fungi can grow on a variety of surfaces provided certain temperature and moisture conditions exist.  Outdoor molds grow on plants, decaying vegetation and soil.  The major  outdoor mold, Alternaria and Cladosporium, are found in very high numbers during hot and dry conditions.  Generally, a late Summer - Fall peak is seen for common outdoor fungal spores.  Rain will temporarily lower outdoor mold spore count, but counts rise rapidly when the rainy period ends.  The most important indoor molds are Aspergillus and Penicillium.  Dark, humid and poorly ventilated basements are ideal sites for mold growth.  The next most common sites of mold growth are the bathroom and the kitchen.  Outdoor (Seasonal) Mold Control  Use air conditioning and keep windows closed Avoid exposure to decaying vegetation. Avoid leaf raking. Avoid grain handling. Consider wearing a face mask if working in moldy areas.    Indoor (Perennial) Mold Control   Maintain humidity below 50%. Clean washable surfaces with 5% bleach solution. Remove sources e.g. contaminated carpets.    Control of Dog or Cat Allergen  Avoidance is the best way to manage a dog or cat allergy. If you have a dog or cat and are allergic to dog or cats, consider removing the dog or cat from the home. If you have a dog or cat but don't want to find it a new home, or if your family wants a pet even though someone in the household is allergic, here are some strategies that may help keep symptoms at bay:  Keep the pet out of your bedroom and restrict it to only a few rooms. Be advised that keeping the dog or cat in only one room will not limit the allergens to that room. Don't pet, hug or kiss the dog or cat; if  you do, wash your hands with soap and water. High-efficiency particulate air (HEPA) cleaners run continuously in a bedroom or living room can reduce allergen levels over time. Regular use of a high-efficiency vacuum cleaner or a central vacuum can reduce allergen levels. Giving your dog or cat a bath at least once a week can reduce airborne allergen. DUST MITE AVOIDANCE MEASURES:  There are three main measures that  need and can be taken to avoid house dust mites:  Reduce accumulation of dust in general -reduce furniture, clothing, carpeting, books, stuffed animals, especially in bedroom  Separate yourself from the dust -use pillow and mattress encasements (can be found at stores such as [employer] or online) -avoid direct exposure to air condition flow -use a HEPA filter device, especially in the bedroom; you can also use a HEPA filter vacuum cleaner -wipe dust with a moist towel instead of a dry towel or broom when cleaning  Decrease mites and/or their secretions -wash clothing and linen and stuffed animals at highest temperature possible, at least every 2 weeks -stuffed animals can also be placed in a bag and put in a freezer overnight  Despite the above measures, it is impossible to eliminate dust mites or their allergen completely from your home.  With the above measures the burden of mites in your home can be diminished, with the goal of minimizing your allergic symptoms.  Success will be reached only when implementing and using all means together.

## 2022-04-10 DIAGNOSIS — J302 Other seasonal allergic rhinitis: Secondary | ICD-10-CM | POA: Diagnosis not present

## 2022-04-10 NOTE — Progress Notes (Signed)
Aeroallergen Immunotherapy   Ordering Provider:  Dr. Tonny Bollman  Patient Details  Name: Emily [name]  MRN: [id number]  Date of Birth: 11-28-1980   Order 1 of 2   Vial Label: Molds   0.2 ml (Volume)  1:20 Concentration -- Alternaria alternata  0.2 ml (Volume)  1:20 Concentration -- Cladosporium herbarum  0.2 ml (Volume)  1:10 Concentration -- Aspergillus mix  0.2 ml (Volume)  1:10 Concentration -- Penicillium mix  0.2 ml (Volume)  1:20 Concentration -- Bipolaris sorokiniana  0.2 ml (Volume)  1:20 Concentration -- Drechslera spicifera  0.2 ml (Volume)  1:10 Concentration -- Mucor plumbeus  0.2 ml (Volume)  1:10 Concentration -- Fusarium moniliforme  0.2 ml (Volume)  1:40 Concentration -- Aureobasidium pullulans  0.2 ml (Volume)  1:10 Concentration -- Rhizopus oryzae    2.0  ml Extract Subtotal  3.0  ml Diluent  5.0  ml Maintenance Total   Schedule:  B  Blue Vial (1:100,000): Schedule B (6 doses)  Yellow Vial (1:10,000): Schedule B (6 doses)  Green Vial (1:1,000): Schedule B (6 doses)  Red Vial (1:100): Schedule A (10 doses)   Special Instructions: normal B schedule

## 2022-04-10 NOTE — Progress Notes (Signed)
Aeroallergen Immunotherapy   Ordering Provider: Dr. Tonny Bollman   Patient Details  Name: Emily [name]  MRN: [id number]  Date of Birth: [birth year]   Order 2 of 2   Vial Label: C-D-DM-G   0.2 ml (Volume)  1:20 Concentration -- Johnson  0.5 ml (Volume)  1:10 Concentration -- Cat Hair  0.5 ml (Volume)  1:10 Concentration -- Dog Epithelia  0.5 ml (Volume)   AU Concentration -- Mite Mix (DF 5,000 & DP 5,000)    1.7  ml Extract Subtotal  3.3  ml Diluent  5.0  ml Maintenance Total   Schedule:  B  Blue Vial (1:100,000): Schedule B (6 doses)  Yellow Vial (1:10,000): Schedule B (6 doses)  Green Vial (1:1,000): Schedule B (6 doses)  Red Vial (1:100): Schedule A (10 doses)   Special Instructions: Normal B schedule

## 2022-04-10 NOTE — Progress Notes (Signed)
VIALS EXP 04-11-23 

## 2022-04-11 DIAGNOSIS — J3089 Other allergic rhinitis: Secondary | ICD-10-CM | POA: Diagnosis not present

## 2022-04-12 LAB — ALLERGEN PROFILE, FOOD-FISH
Allergen Mackerel IgE: <0.1 kU/L
Allergen Salmon IgE: <0.1 kU/L
Allergen Trout IgE: <0.1 kU/L
Allergen Walley Pike IgE: <0.1 kU/L
Codfish IgE: <0.1 kU/L
Halibut IgE: <0.1 kU/L
Tuna: <0.1 kU/L

## 2022-04-12 LAB — ALLERGEN PROFILE, SHELLFISH
Clam IgE: <0.1 kU/L
F023-IgE Crab: <0.1 kU/L
F080-IgE Lobster: <0.1 kU/L
F290-IgE Oyster: <0.1 kU/L
Scallop IgE: <0.1 kU/L
Shrimp IgE: <0.1 kU/L

## 2022-04-12 LAB — ALLERGEN, MUTTON, F88: Allergen Lamb IgE: 0.95 kU/L — AB

## 2022-04-30 ENCOUNTER — Ambulatory Visit (INDEPENDENT_AMBULATORY_CARE_PROVIDER_SITE_OTHER): Payer: Commercial Managed Care - PPO

## 2022-04-30 DIAGNOSIS — J309 Allergic rhinitis, unspecified: Secondary | ICD-10-CM

## 2022-04-30 NOTE — Progress Notes (Signed)
Immunotherapy   Patient Details  Name: Emily [name] MRN: [id number] Date of Birth: 1981-01-02  04/30/2022  [name] started injections for  C-D-DM-G and Molds. Patient received 0.05 of both her blue vials with an expiration of 04/11/2023. Patient waited 30 minutes with no problems. Following schedule: B  Frequency:1 time per week Epi-Pen:Epi-Pen Available  Consent signed and patient instructions given.   Dub Mikes 04/30/2022, 9:13 AM

## 2022-05-10 ENCOUNTER — Ambulatory Visit (INDEPENDENT_AMBULATORY_CARE_PROVIDER_SITE_OTHER): Payer: Commercial Managed Care - PPO

## 2022-05-10 DIAGNOSIS — J309 Allergic rhinitis, unspecified: Secondary | ICD-10-CM

## 2022-05-17 ENCOUNTER — Ambulatory Visit (INDEPENDENT_AMBULATORY_CARE_PROVIDER_SITE_OTHER): Payer: Commercial Managed Care - PPO

## 2022-05-17 DIAGNOSIS — J309 Allergic rhinitis, unspecified: Secondary | ICD-10-CM | POA: Diagnosis not present

## 2022-05-24 ENCOUNTER — Ambulatory Visit (INDEPENDENT_AMBULATORY_CARE_PROVIDER_SITE_OTHER): Payer: Commercial Managed Care - PPO

## 2022-05-24 DIAGNOSIS — J309 Allergic rhinitis, unspecified: Secondary | ICD-10-CM | POA: Diagnosis not present

## 2022-05-25 ENCOUNTER — Ambulatory Visit
Admission: RE | Admit: 2022-05-25 | Discharge: 2022-05-25 | Disposition: A | Discharge status: Home / Self Care | Payer: Commercial Managed Care - PPO | Source: Ambulatory Visit | Attending: Nurse Practitioner | Admitting: Nurse Practitioner

## 2022-05-25 DIAGNOSIS — Z87891 Personal history of nicotine dependence: Secondary | ICD-10-CM

## 2022-05-31 ENCOUNTER — Ambulatory Visit (INDEPENDENT_AMBULATORY_CARE_PROVIDER_SITE_OTHER): Payer: Commercial Managed Care - PPO

## 2022-05-31 DIAGNOSIS — J309 Allergic rhinitis, unspecified: Secondary | ICD-10-CM | POA: Diagnosis not present

## 2022-06-07 ENCOUNTER — Ambulatory Visit (INDEPENDENT_AMBULATORY_CARE_PROVIDER_SITE_OTHER): Payer: Commercial Managed Care - PPO | Admitting: *Deleted

## 2022-06-07 DIAGNOSIS — J309 Allergic rhinitis, unspecified: Secondary | ICD-10-CM

## 2022-06-13 ENCOUNTER — Other Ambulatory Visit: Payer: Self-pay | Admitting: Nurse Practitioner

## 2022-06-13 NOTE — Telephone Encounter (Signed)
Requested Prescriptions  Pending Prescriptions Disp Refills  . buPROPion (WELLBUTRIN XL) 300 MG 24 hr tablet [Pharmacy Med Name: BUPROPION HCL XL 300 MG TABLET] 30 tablet 0    Sig: Take 1 tablet (300 mg total) by mouth daily.     Psychiatry: Antidepressants - bupropion Failed - 06/13/2022  7:25 AM      Failed - Cr in normal range and within 360 days    Creatinine, Ser  Date Value Ref Range Status  01/10/2022 1.04 (H) 0.57 - 1.00 mg/dL Final         Failed - AST in normal range and within 360 days    AST  Date Value Ref Range Status  01/10/2022 53 (H) 0 - 40 IU/L Final         Failed - ALT in normal range and within 360 days    ALT  Date Value Ref Range Status  01/10/2022 65 (H) 0 - 32 IU/L Final         Passed - Last BP in normal range    BP Readings from Last 1 Encounters:  04/09/22 118/78         Passed - Valid encounter within last 6 months    Recent Outpatient Visits          4 months ago Right foot pain   [facility] Jon Billings, NP   5 months ago Lower extremity edema   [facility] Jon Billings, NP   6 months ago Annual physical exam   [facility] Jon Billings, NP   11 months ago Class 2 obesity due to excess calories without serious comorbidity with body mass index (BMI) of 39.0 to 39.9 in adult   [place], Santiago Glad, NP   12 months ago Class 2 obesity due to excess calories without serious comorbidity with body mass index (BMI) of 39.0 to 39.9 in adult   [facility] Jon Billings, NP      Future Appointments            In 1 week Jon Billings, NP [facility] [place]   In 4 weeks Tennille, FNP [facility]

## 2022-06-14 ENCOUNTER — Ambulatory Visit (INDEPENDENT_AMBULATORY_CARE_PROVIDER_SITE_OTHER): Payer: Commercial Managed Care - PPO

## 2022-06-14 DIAGNOSIS — J309 Allergic rhinitis, unspecified: Secondary | ICD-10-CM | POA: Diagnosis not present

## 2022-06-21 NOTE — Progress Notes (Signed)
BP 129/86   Pulse 70   Temp 97.8 F (36.6 C) (Oral)   Wt 270 lb 12.8 oz (122.8 kg)   LMP 06/01/2015 (Within Days)   SpO2 98%   BMI 41.17 kg/m    Subjective:    Patient ID: Emily [name], female    DOB: 02-06-1981, 41 y.o.   MRN: [id number]  HPI: Emily [name] is a 41 y.o. female  Chief Complaint  Patient presents with   Anxiety   Depression    6 month follow up    MOOD Patient states she feels like her mood has been good.  She is getting the wellbutrin manufacturer was changed and preferred the other dose.  But feels like she is tolerating it well.  She has been seeing [facility] who believes she has nerve damage in her foot.  She was started on lyrica which is helping but still has some pain.    [place] Office Visit from 06/25/2022 in [place]  PHQ-9 Total Score 3         06/25/2022    9:24 AM 02/07/2022    4:10 PM 01/10/2022    1:20 PM 12/14/2021    8:08 AM  GAD 7 : Generalized Anxiety Score  Nervous, Anxious, on Edge 0 0 0 0  Control/stop worrying 0 0 0 0  Worry too much - different things 0 0 0 0  Trouble relaxing 1 0 0 0  Restless 0 0 0 0  Easily annoyed or irritable 1 2 0 1  Afraid - awful might happen 0 0 0 0  Total GAD 7 Score 2 2 0 1  Anxiety Difficulty Somewhat difficult Not difficult at all Not difficult at all Somewhat difficult       Relevant past medical, surgical, family and social history reviewed and updated as indicated. Interim medical history since our last visit reviewed. Allergies and medications reviewed and updated.  Review of Systems  Psychiatric/Behavioral:  Positive for dysphoric mood. Negative for suicidal ideas. The patient is nervous/anxious.     Per HPI unless specifically indicated above     Objective:    BP 129/86   Pulse 70   Temp 97.8 F (36.6 C) (Oral)   Wt 270 lb 12.8 oz (122.8 kg)   LMP 06/01/2015 (Within Days)   SpO2 98%   BMI 41.17 kg/m   Wt Readings from Last 3 Encounters:   06/25/22 270 lb 12.8 oz (122.8 kg)  04/09/22 274 lb (124.3 kg)  03/12/22 274 lb (124.3 kg)    Physical Exam Vitals and nursing note reviewed.  Constitutional:      General: She is not in acute distress.    Appearance: Normal appearance. She is obese. She is not ill-appearing, toxic-appearing or diaphoretic.  HENT:     Head: Normocephalic.     Right Ear: External ear normal.     Left Ear: External ear normal.     Nose: Nose normal.     Mouth/Throat:     Mouth: Mucous membranes are moist.     Pharynx: Oropharynx is clear.  Eyes:     General:        Right eye: No discharge.        Left eye: No discharge.     Extraocular Movements: Extraocular movements intact.     Conjunctiva/sclera: Conjunctivae normal.     Pupils: Pupils are equal, round, and reactive to light.  Cardiovascular:     Rate and Rhythm: Normal  rate and regular rhythm.     Heart sounds: No murmur heard. Pulmonary:     Effort: Pulmonary effort is normal. No respiratory distress.     Breath sounds: Normal breath sounds. No wheezing or rales.  Musculoskeletal:     Cervical back: Normal range of motion and neck supple.  Skin:    General: Skin is warm and dry.     Capillary Refill: Capillary refill takes less than 2 seconds.  Neurological:     General: No focal deficit present.     Mental Status: She is alert and oriented to person, place, and time. Mental status is at baseline.  Psychiatric:        Mood and Affect: Mood normal.        Behavior: Behavior normal.        Thought Content: Thought content normal.        Judgment: Judgment normal.     Results for orders placed or performed in visit on 04/09/22  Allergen Profile, Shellfish  Result Value Ref Range   Clam IgE <0.10 Class 0 kU/L   F023-IgE Crab <0.10 Class 0 kU/L   Shrimp IgE <0.10 Class 0 kU/L   Scallop IgE <0.10 Class 0 kU/L   F290-IgE Oyster <0.10 Class 0 kU/L   F080-IgE Lobster <0.10 Class 0 kU/L  Allergen Profile, Food-Fish  Result Value Ref  Range   Class Description Allergens Comment    Codfish IgE <0.10 Class 0 kU/L   Halibut IgE <0.10 Class 0 kU/L   Allergen [place] IgE <0.10 Class 0 kU/L   Tuna <0.10 Class 0 kU/L   Allergen Salmon IgE <0.10 Class 0 kU/L   Allergen Mackerel IgE <0.10 Class 0 kU/L   Allergen Trout IgE <0.10 Class 0 kU/L  Allergen, Mutton, f88  Result Value Ref Range   Allergen Lamb IgE 0.95 (A) Class II kU/L      Assessment & Plan:   Problem List Items Addressed This Visit       Other   Bipolar disorder (HCC) - Primary    Chronic.  Controlled.  Continue with current medication regimen of Wellbutrin 300mg  daily.  Refills sent today.   Return to clinic in 6 months for reevaluation.  Call sooner if concerns arise.          Follow up plan: Return in about 6 months (around 12/25/2022) for Physical and Fasting labs.

## 2022-06-22 ENCOUNTER — Ambulatory Visit (INDEPENDENT_AMBULATORY_CARE_PROVIDER_SITE_OTHER): Payer: Commercial Managed Care - PPO

## 2022-06-22 DIAGNOSIS — J309 Allergic rhinitis, unspecified: Secondary | ICD-10-CM

## 2022-06-25 ENCOUNTER — Ambulatory Visit (INDEPENDENT_AMBULATORY_CARE_PROVIDER_SITE_OTHER): Payer: Commercial Managed Care - PPO | Admitting: Nurse Practitioner

## 2022-06-25 ENCOUNTER — Encounter: Payer: Self-pay | Admitting: Nurse Practitioner

## 2022-06-25 VITALS — BP 129/86 | HR 70 | Temp 97.8°F | Wt 270.8 lb

## 2022-06-25 DIAGNOSIS — F3170 Bipolar disorder, currently in remission, most recent episode unspecified: Secondary | ICD-10-CM | POA: Diagnosis not present

## 2022-06-25 MED ORDER — BUPROPION HCL ER (XL) 300 MG PO TB24: 300.0000 mg | ORAL_TABLET | Freq: Every day | ORAL | 1 refills | Status: DC

## 2022-06-25 NOTE — Assessment & Plan Note (Signed)
Chronic.  Controlled.  Continue with current medication regimen of Wellbutrin 300mg  daily.  Refills sent today.   Return to clinic in 6 months for reevaluation.  Call sooner if concerns arise.

## 2022-06-29 ENCOUNTER — Ambulatory Visit (INDEPENDENT_AMBULATORY_CARE_PROVIDER_SITE_OTHER): Payer: Commercial Managed Care - PPO

## 2022-06-29 DIAGNOSIS — J309 Allergic rhinitis, unspecified: Secondary | ICD-10-CM | POA: Diagnosis not present

## 2022-07-06 ENCOUNTER — Ambulatory Visit (INDEPENDENT_AMBULATORY_CARE_PROVIDER_SITE_OTHER): Payer: Commercial Managed Care - PPO

## 2022-07-06 DIAGNOSIS — J309 Allergic rhinitis, unspecified: Secondary | ICD-10-CM

## 2022-07-11 ENCOUNTER — Encounter: Payer: Self-pay | Admitting: Family Medicine

## 2022-07-11 ENCOUNTER — Ambulatory Visit (INDEPENDENT_AMBULATORY_CARE_PROVIDER_SITE_OTHER): Payer: Commercial Managed Care - PPO | Admitting: Family Medicine

## 2022-07-11 VITALS — BP 120/82 | HR 78 | Temp 98.7°F | Resp 18

## 2022-07-11 DIAGNOSIS — J45909 Unspecified asthma, uncomplicated: Secondary | ICD-10-CM

## 2022-07-11 DIAGNOSIS — L299 Pruritus, unspecified: Secondary | ICD-10-CM

## 2022-07-11 DIAGNOSIS — J4540 Moderate persistent asthma, uncomplicated: Secondary | ICD-10-CM | POA: Diagnosis not present

## 2022-07-11 DIAGNOSIS — R059 Cough, unspecified: Secondary | ICD-10-CM | POA: Diagnosis not present

## 2022-07-11 DIAGNOSIS — J309 Allergic rhinitis, unspecified: Secondary | ICD-10-CM

## 2022-07-11 MED ORDER — IPRATROPIUM BROMIDE 0.03 % NA SOLN: 1.0000 | Freq: Two times a day (BID) | NASAL | 5 refills | Status: DC | PRN

## 2022-07-11 MED ORDER — PULMICORT 0.5 MG/2ML IN SUSP: 0.5000 mg | Freq: Two times a day (BID) | RESPIRATORY_TRACT | 5 refills | Status: DC

## 2022-07-11 NOTE — Progress Notes (Signed)
[place] [address] Dept: [phone]  FOLLOW UP NOTE  Patient ID: Emily [name], female    DOB: April 20, 1981  Age: 41 y.o. MRN: [id number] Date of Office Visit: 07/11/2022  Assessment  Chief Complaint: No chief complaint on file.  HPI Emily [name] is a 41 year old female who presents to the clinic for follow-up visit.  She was last seen in this clinic on 04/09/2022 by Dr. 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  Her last environmental allergy panel was positive on intradermal testing to grass pollen, mold, cat, dog, and dust mite.  She began allergen immunotherapy on 04/30/2022.  At today's visit, she reports her allergies and asthma have improved since her last visit to this clinic.  Allergic rhinitis is reported as moderately well controlled with occasional nasal congestion, occasional sneeze, postnasal drainage which has improved, and infrequent nasal drainage mostly occurring in the spring.  She continues Atrovent as needed and occasionally takes Benadryl at night with relief of symptoms.  She continues allergen immunotherapy once a week with no large or local reactions.  Asthma is reported as moderately well controlled with symptoms including occasional shortness of breath mostly associated with cough and occasional cough which is reported as a mix of dry and mucus producing.  She reports a significant decrease in her asthma symptoms while continuing allergen immunotherapy.  She reports that she has been using albuterol about 2-3 times a week until the last few weeks when she has increased her albuterol to 3 to 5 days a week.  She reports that she has had difficulty with inhalers in the past including throat irritation and inflammation.  She reports that she does not feel this throat irritation or inflammation when using albuterol via nebulizer.  She does report  that she used Pulmicort via nebulizer 1 time with relief of symptoms and has not needed to use Pulmicort via nebulizer since that time.  She uses a CPAP at nighttime for sleep apnea, however, has not been able to use this mask recently due to cough.  She denies fever, sweats, chills, and sick contacts.  She denies symptoms of reflux including heartburn and vomiting and has not previously needed to take a medication for control of reflux.  She does report occasional overall pruritus for which she continues Benadryl as needed with relief of symptoms.  She continues to avoid lamb, shellfish, fish, artificial sweeteners, and agave.  She reports that she does occasionally eat shrimp with no adverse reaction.  EpiPen's are noted to be up-to-date.  Her current medications are listed in the chart.   Drug Allergies:  Allergies  Allergen Reactions   Codeine Diarrhea and Nausea And Vomiting    Physical Exam: BP 120/82 (BP Location: Left Arm, Patient Position: Sitting, Cuff Size: Large)   Pulse 78   Temp 98.7 F (37.1 C) (Temporal)   Resp 18   LMP 06/01/2015 (Within Days)   SpO2 97%    Physical Exam Vitals reviewed.  Constitutional:      Appearance: Normal appearance.  HENT:     Head: Normocephalic and atraumatic.     Right Ear: Tympanic membrane normal.     Left Ear: Tympanic membrane normal.     Nose:     Comments: Bilateral nares slightly erythematous with clear nasal drainage noted.  Pharynx normal.  Ears normal.  Eyes normal.    Mouth/Throat:  Pharynx: Oropharynx is clear.  Eyes:     Conjunctiva/sclera: Conjunctivae normal.  Cardiovascular:     Rate and Rhythm: Normal rate and regular rhythm.     Heart sounds: Normal heart sounds. No murmur heard. Pulmonary:     Effort: Pulmonary effort is normal.     Breath sounds: Normal breath sounds.     Comments: Lungs clear to auscultation Musculoskeletal:        General: Normal range of motion.     Cervical back: Normal range of motion and  neck supple.  Skin:    General: Skin is warm and dry.  Neurological:     Mental Status: She is alert and oriented to person, place, and time.  Psychiatric:        Mood and Affect: Mood normal.        Behavior: Behavior normal.        Thought Content: Thought content normal.        Judgment: Judgment normal.     Diagnostics: FVC 3.07, FEV1 2.52.  Predicted FVC 4.18, predicted FEV1 3.38.  Spirometry indicates possible mild restriction  Assessment and Plan: 1. Allergic rhinitis, unspecified seasonality, unspecified trigger   2. Cough, unspecified type   3. Asthma due to seasonal allergies   4. Pruritus     Meds ordered this encounter  Medications   ipratropium (ATROVENT) 0.03 % nasal spray    Sig: Place 1 spray into both nostrils 2 (two) times daily as needed for rhinitis.    Dispense:  30 mL    Refill:  5   PULMICORT 0.5 MG/2ML nebulizer solution    Sig: Take 2 mLs (0.5 mg total) by nebulization 2 (two) times daily.    Dispense:  60 mL    Refill:  5    Patient Instructions  Asthma For now and for asthma flare, begin Pulmicort 0.5 mg via nebulizer twice a day to prevent cough or wheeze. You may stop in 2 weeks or until cough and wheeze free. Call the clinic with any throat issues Continue Xopenex once every 6 hours as needed for cough or wheeze  Allergic rhinitis Continue allergen avoidance measures directed toward pollen, mold, pet, and dust mite as listed below Continue Flonase nasal spray 2 sprays in each nostril once a day as needed for stuffy nose Continue ipratropium nasal spray 2 sprays in each nostril twice a day as needed for nasal symptoms Consider saline nasal rinses as needed for nasal symptoms. Use this before any medicated nasal sprays for best result Continue allergen immunotherapy per protocol and have access to an epinephrine autoinjector set  Pruritus You can begin an antihistamine once a day as needed for itch. Some examples of over the counter  antihistamines include Zyrtec (cetirizine), Xyzal (levocetirizine), Allegra (fexofenadine), and Claritin (loratidine).   Food allergy Continue strict avoidance of lamb.  Continue to avoid fish, shellfish, artificial sweeteners, and agave.  In case of an allergic reaction, take Benadryl 50 mg every 4 hours, and if life-threatening symptoms occur, inject with EpiPen 0.3 mg.  Call the clinic if this treatment plan is not working well for you.  Follow up in 3 months or sooner if needed.   Return in about 3 months (around 10/11/2022), or if symptoms worsen or fail to improve.    Thank you for the opportunity to care for this patient.  Please do not hesitate to contact me with questions.  Thermon Leyland, FNP [facility]

## 2022-07-11 NOTE — Patient Instructions (Addendum)
Asthma For now and for asthma flare, begin Pulmicort 0.5 mg via nebulizer twice a day to prevent cough or wheeze. You may stop in 2 weeks or until cough and wheeze free. Call the clinic with any throat issues Continue Xopenex once every 6 hours as needed for cough or wheeze  Allergic rhinitis Continue allergen avoidance measures directed toward pollen, mold, pet, and dust mite as listed below Continue Flonase nasal spray 2 sprays in each nostril once a day as needed for stuffy nose Continue ipratropium nasal spray 2 sprays in each nostril twice a day as needed for nasal symptoms Consider saline nasal rinses as needed for nasal symptoms. Use this before any medicated nasal sprays for best result Continue allergen immunotherapy per protocol and have access to an epinephrine autoinjector set  Pruritus You can begin an antihistamine once a day as needed for itch. Some examples of over the counter antihistamines include Zyrtec (cetirizine), Xyzal (levocetirizine), Allegra (fexofenadine), and Claritin (loratidine).   Food allergy Continue strict avoidance of lamb.  Continue to avoid fish, shellfish, artificial sweeteners, and agave.  In case of an allergic reaction, take Benadryl 50 mg every 4 hours, and if life-threatening symptoms occur, inject with EpiPen 0.3 mg.  Call the clinic if this treatment plan is not working well for you.  Follow up in 3 months or sooner if needed.  Reducing Pollen Exposure The American Academy of Allergy, Asthma and Immunology suggests the following steps to reduce your exposure to pollen during allergy seasons. Do not hang sheets or clothing out to dry; pollen may collect on these items. Do not mow lawns or spend time around freshly cut grass; mowing stirs up pollen. Keep windows closed at night.  Keep car windows closed while driving. Minimize morning activities outdoors, a time when pollen counts are usually at their highest. Stay indoors as much as possible when  pollen counts or humidity is high and on windy days when pollen tends to remain in the air longer. Use air conditioning when possible.  Many air conditioners have filters that trap the pollen spores. Use a HEPA room air filter to remove pollen form the indoor air you breathe.  Control of Dog or Cat Allergen Avoidance is the best way to manage a dog or cat allergy. If you have a dog or cat and are allergic to dog or cats, consider removing the dog or cat from the home. If you have a dog or cat but don't want to find it a new home, or if your family wants a pet even though someone in the household is allergic, here are some strategies that may help keep symptoms at bay:  Keep the pet out of your bedroom and restrict it to only a few rooms. Be advised that keeping the dog or cat in only one room will not limit the allergens to that room. Don't pet, hug or kiss the dog or cat; if you do, wash your hands with soap and water. High-efficiency particulate air (HEPA) cleaners run continuously in a bedroom or living room can reduce allergen levels over time. Regular use of a high-efficiency vacuum cleaner or a central vacuum can reduce allergen levels. Giving your dog or cat a bath at least once a week can reduce airborne allergen.   Control of Dust Mite Allergen Dust mites play a major role in allergic asthma and rhinitis. They occur in environments with high humidity wherever human skin is found. Dust mites absorb humidity from the atmosphere (ie,  they do not drink) and feed on organic matter (including shed human and animal skin). Dust mites are a microscopic type of insect that you cannot see with the naked eye. High levels of dust mites have been detected from mattresses, pillows, carpets, upholstered furniture, bed covers, clothes, soft toys and any woven material. The principal allergen of the dust mite is found in its feces. A gram of dust may contain 1,000 mites and 250,000 fecal particles. Mite  antigen is easily measured in the air during house cleaning activities. Dust mites do not bite and do not cause harm to humans, other than by triggering allergies/asthma.  Ways to decrease your exposure to dust mites in your home:  1. Encase mattresses, box springs and pillows with a mite-impermeable barrier or cover  2. Wash sheets, blankets and drapes weekly in hot water (130 F) with detergent and dry them in a dryer on the hot setting.  3. Have the room cleaned frequently with a vacuum cleaner and a damp dust-mop. For carpeting or rugs, vacuuming with a vacuum cleaner equipped with a high-efficiency particulate air (HEPA) filter. The dust mite allergic individual should not be in a room which is being cleaned and should wait 1 hour after cleaning before going into the room.  4. Do not sleep on upholstered furniture (eg, couches).  5. If possible removing carpeting, upholstered furniture and drapery from the home is ideal. Horizontal blinds should be eliminated in the rooms where the person spends the most time (bedroom, study, television room). Washable vinyl, roller-type shades are optimal.  6. Remove all non-washable stuffed toys from the bedroom. Wash stuffed toys weekly like sheets and blankets above.  7. Reduce indoor humidity to less than 50%. Inexpensive humidity monitors can be purchased at most hardware stores. Do not use a humidifier as can make the problem worse and are not recommended.  Control of Mold Allergen Mold and fungi can grow on a variety of surfaces provided certain temperature and moisture conditions exist.  Outdoor molds grow on plants, decaying vegetation and soil.  The major outdoor mold, Alternaria and Cladosporium, are found in very high numbers during hot and dry conditions.  Generally, a late Summer - Fall peak is seen for common outdoor fungal spores.  Rain will temporarily lower outdoor mold spore count, but counts rise rapidly when the rainy period ends.  The  most important indoor molds are Aspergillus and Penicillium.  Dark, humid and poorly ventilated basements are ideal sites for mold growth.  The next most common sites of mold growth are the bathroom and the kitchen.  Outdoor [employer] Use air conditioning and keep windows closed Avoid exposure to decaying vegetation. Avoid leaf raking. Avoid grain handling. Consider wearing a face mask if working in moldy areas.  Indoor Mold Control Maintain humidity below 50%. Clean washable surfaces with 5% bleach solution. Remove sources e.g. Contaminated carpets.

## 2022-07-17 ENCOUNTER — Encounter: Payer: Self-pay | Admitting: Family Medicine

## 2022-07-17 DIAGNOSIS — J45909 Unspecified asthma, uncomplicated: Secondary | ICD-10-CM

## 2022-07-19 ENCOUNTER — Ambulatory Visit (INDEPENDENT_AMBULATORY_CARE_PROVIDER_SITE_OTHER): Payer: Commercial Managed Care - PPO

## 2022-07-19 ENCOUNTER — Other Ambulatory Visit: Payer: Self-pay | Admitting: *Deleted

## 2022-07-19 DIAGNOSIS — J309 Allergic rhinitis, unspecified: Secondary | ICD-10-CM | POA: Diagnosis not present

## 2022-07-19 MED ORDER — PREDNISONE 10 MG PO TABS: ORAL_TABLET | ORAL | 0 refills | Status: DC

## 2022-07-19 NOTE — Telephone Encounter (Signed)
Can you please call in Prednisone 10 mg tablets. Take 2 tablets once a day for 4 days, then take 1 tablet on the 5th day, then stop.  Please have her stop the nebulizer treatment at this time. Thank you. Can you please have her come in for a total IgE level lab? She might qualify for Xolair injections. Thank you. Can you please order a total IgE level? Thank you

## 2022-07-23 ENCOUNTER — Encounter: Payer: Self-pay | Admitting: Dietician

## 2022-07-23 ENCOUNTER — Ambulatory Visit (INDEPENDENT_AMBULATORY_CARE_PROVIDER_SITE_OTHER): Payer: Commercial Managed Care - PPO

## 2022-07-23 ENCOUNTER — Encounter: Payer: Commercial Managed Care - PPO | Attending: Surgery | Admitting: Dietician

## 2022-07-23 DIAGNOSIS — F319 Bipolar disorder, unspecified: Secondary | ICD-10-CM | POA: Insufficient documentation

## 2022-07-23 DIAGNOSIS — G4730 Sleep apnea, unspecified: Secondary | ICD-10-CM | POA: Insufficient documentation

## 2022-07-23 DIAGNOSIS — J309 Allergic rhinitis, unspecified: Secondary | ICD-10-CM | POA: Diagnosis not present

## 2022-07-23 DIAGNOSIS — Z713 Dietary counseling and surveillance: Secondary | ICD-10-CM | POA: Insufficient documentation

## 2022-07-23 DIAGNOSIS — Z6841 Body Mass Index (BMI) 40.0 and over, adult: Secondary | ICD-10-CM | POA: Diagnosis not present

## 2022-07-23 DIAGNOSIS — E669 Obesity, unspecified: Secondary | ICD-10-CM

## 2022-07-23 DIAGNOSIS — K589 Irritable bowel syndrome without diarrhea: Secondary | ICD-10-CM | POA: Insufficient documentation

## 2022-07-23 NOTE — Progress Notes (Signed)
Pre-Operative Nutrition Class:    Patient was seen on 07/23/2022 for Pre-Operative Bariatric Surgery Education at the [facility].    Surgery date: 08/14/2022 Surgery type: Sleeve Gastrectomy  Anthropometrics  Start weight at [facility]: 252 lbs (date: 09/11/2021)  Height: 68 in Weight today: 268.4 lbs BMI: 40.81 kg/m2     Clinical  Medical hx: sleep apnea, bipolar, IBS Medications: see list Labs: LDL 134, creatinine 1.13, B12 873 Notable signs/symptoms: constipation  Any previous deficiencies? Yes; vitamin D, vitamin B12, iron   Samples given per MNT protocol. Patient educated on appropriate usage:   [place] Lot # [id number] Exp: 9/26   Bariatric Advantage Calcium  Lot # [id number] Exp:08/14/2022   Ensure Max Protein Shake Lot # [id number] Exp: [id number]  The following the learning objectives were met by the patient during this course: Identify Pre-Op Dietary Goals and will begin 2 weeks pre-operatively Identify appropriate sources of fluids and proteins  State protein recommendations and appropriate sources pre and post-operatively Identify Post-Operative Dietary Goals and will follow for 2 weeks post-operatively Identify appropriate multivitamin and calcium sources Describe the need for physical activity post-operatively and will follow MD recommendations State when to call healthcare provider regarding medication questions or post-operative complications When having a diagnosis of diabetes understanding hypoglycemia symptoms and the inclusion of 1 complex carbohydrate per meal  Handouts given during class include: Pre-Op Bariatric Surgery Diet Handout Protein Shake Handout Post-Op Bariatric Surgery Nutrition Handout BELT Program Information Flyer Support Group Information Flyer [facility] Bariatric Supplements Price List  Follow-Up Plan: Patient will follow-up at [facility] 2 weeks post operatively for  diet advancement per MD.

## 2022-07-25 LAB — IGE: IgE (Immunoglobulin E), Serum: 32 IU/mL (ref 6–495)

## 2022-07-25 NOTE — Progress Notes (Signed)
Sent message, via epic in basket, requesting order in epic from surgeon     07/25/22 1112  Preop Orders  Has preop orders? No  Name of staff/physician contacted for orders(Indicate phone or IB message) M. Hassell Done, MD

## 2022-07-26 NOTE — Progress Notes (Signed)
Can you please let this patient know that she does qualify for xolair injections that may help with her breathing. Please send out written information about Xolair for asthma and have her call the clinic if she wants to move forward with this treatment. Thank you

## 2022-07-27 NOTE — Progress Notes (Signed)
Second request for pre op orders, I spoke with Holly.   

## 2022-07-30 ENCOUNTER — Encounter (HOSPITAL_COMMUNITY): Payer: Self-pay | Admitting: Surgery

## 2022-07-30 ENCOUNTER — Encounter (HOSPITAL_COMMUNITY): Payer: Self-pay

## 2022-07-30 ENCOUNTER — Ambulatory Visit: Payer: Self-pay | Admitting: Surgery

## 2022-07-30 NOTE — Patient Instructions (Signed)
SURGICAL WAITING ROOM VISITATION Patients having surgery or a procedure may have no more than 2 support people in the waiting area - these visitors may rotate.   Children under the age of 2 must have an adult with them who is not the patient. If the patient needs to stay at the hospital during part of their recovery, the visitor guidelines for inpatient rooms apply. Pre-op nurse will coordinate an appropriate time for 1 support person to accompany patient in pre-op.  This support person may not rotate.    Please refer to the [facility] website for the visitor guidelines for Inpatients (after your surgery is over and you are in a regular room).       Your procedure is scheduled on 08/14/22     Report to [facility] Main Entrance    Report to admitting at  0515 AM   Call this number if you have problems the morning of surgery [phone]   Do not eat food :After Midnight.   After Midnight you may have the following liquids until ____ 0430__ AM OF SURGERY  Water Non-Citrus Juices (without pulp, NO RED) Carbonated Beverages Black Coffee (NO MILK/CREAM OR CREAMERS, sugar ok)  Clear Tea (NO MILK/CREAM OR CREAMERS, sugar ok) regular and decaf                             Plain Jell-O (NO RED)                                           Fruit ices (not with fruit pulp, NO RED)                                     Popsicles (NO RED)                                                               Sports drinks like Gatorade (NO RED)                    The day of surgery:  Drink ONE (1) Pre-Surgery Clear Ensure or G2 at   0430  ( have completed by )  morning of surgery. Drink in one sitting. Do not sip.  This drink was given to you during your hospital  pre-op appointment visit. Nothing else to drink after completing the  Pre-Surgery Clear Ensure or G2.          If you have questions, please contact your surgeon's office.        Oral Hygiene is also important to reduce your  risk of infection.                                    Remember - BRUSH YOUR TEETH THE MORNING OF SURGERY WITH YOUR REGULAR TOOTHPASTE   Do NOT smoke after Midnight   Take these medicines the morning of surgery with A SIP OF WATER:  Inhalers as usual and bring, wellbutrin,  lyrica   DO NOT TAKE ANY ORAL DIABETIC MEDICATIONS DAY OF YOUR SURGERY  Bring CPAP mask and tubing day of surgery.                              You may not have any metal on your body including hair pins, jewelry, and body piercing             Do not wear make-up, lotions, powders, perfumes/cologne, or deodorant  Do not wear nail polish including gel and S&S, artificial/acrylic nails, or any other type of covering on natural nails including finger and toenails. If you have artificial nails, gel coating, etc. that needs to be removed by a nail salon please have this removed prior to surgery or surgery may need to be canceled/ delayed if the surgeon/ anesthesia feels like they are unable to be safely monitored.   Do not shave  48 hours prior to surgery.               Men may shave face and neck.   Do not bring valuables to the hospital. [facility] IS NOT             RESPONSIBLE   FOR VALUABLES.   Contacts, dentures or bridgework may not be worn into surgery.   Bring small overnight bag day of surgery.   DO NOT BRING YOUR HOME MEDICATIONS TO THE HOSPITAL. PHARMACY WILL DISPENSE MEDICATIONS LISTED ON YOUR MEDICATION LIST TO YOU DURING YOUR ADMISSION IN THE HOSPITAL!    Patients discharged on the day of surgery will not be allowed to drive home.  Someone NEEDS to stay with you for the first 24 hours after anesthesia.   Special Instructions: Bring a copy of your healthcare power of attorney and living will documents the day of surgery if you haven't scanned them before.              Please read over the following fact sheets you were given: IF YOU HAVE QUESTIONS ABOUT YOUR PRE-OP INSTRUCTIONS PLEASE CALL  [phone]   If you received a COVID test during your pre-op visit  it is requested that you wear a mask when out in public, stay away from anyone that may not be feeling well and notify your surgeon if you develop symptoms. If you test positive for Covid or have been in contact with anyone that has tested positive in the last 10 days please notify you surgeon.    [facility] - Preparing for Surgery Before surgery, you can play an important role.  Because skin is not sterile, your skin needs to be as free of germs as possible.  You can reduce the number of germs on your skin by washing with CHG (chlorahexidine gluconate) soap before surgery.  CHG is an antiseptic cleaner which kills germs and bonds with the skin to continue killing germs even after washing. Please DO NOT use if you have an allergy to CHG or antibacterial soaps.  If your skin becomes reddened/irritated stop using the CHG and inform your nurse when you arrive at Short Stay. Do not shave (including legs and underarms) for at least 48 hours prior to the first CHG shower.  You may shave your face/neck. Please follow these instructions carefully:  1.  Shower with CHG Soap the night before surgery and the  morning of Surgery.  2.  If you choose to wash your hair, wash your hair first  as usual with your  normal  shampoo.  3.  After you shampoo, rinse your hair and body thoroughly to remove the  shampoo.                           4.  Use CHG as you would any other liquid soap.  You can apply chg directly  to the skin and wash                       Gently with a scrungie or clean washcloth.  5.  Apply the CHG Soap to your body ONLY FROM THE NECK DOWN.   Do not use on face/ open                           Wound or open sores. Avoid contact with eyes, ears mouth and genitals (private parts).                       Wash face,  Genitals (private parts) with your normal soap.             6.  Wash thoroughly, paying special attention to the area  where your surgery  will be performed.  7.  Thoroughly rinse your body with warm water from the neck down.  8.  DO NOT shower/wash with your normal soap after using and rinsing off  the CHG Soap.                9.  Pat yourself dry with a clean towel.            10.  Wear clean pajamas.            11.  Place clean sheets on your bed the night of your first shower and do not  sleep with pets. Day of Surgery : Do not apply any lotions/deodorants the morning of surgery.  Please wear clean clothes to the hospital/surgery center.  FAILURE TO FOLLOW THESE INSTRUCTIONS MAY RESULT IN THE CANCELLATION OF YOUR SURGERY PATIENT SIGNATURE_________________________________  NURSE SIGNATURE__________________________________  ________________________________________________________________________[facility] - Preparing for Surgery Before surgery, you can play an important role.  Because skin is not sterile, your skin needs to be as free of germs as possible.  You can reduce the number of germs on your skin by washing with CHG (chlorahexidine gluconate) soap before surgery.  CHG is an antiseptic cleaner which kills germs and bonds with the skin to continue killing germs even after washing. Please DO NOT use if you have an allergy to CHG or antibacterial soaps.  If your skin becomes reddened/irritated stop using the CHG and inform your nurse when you arrive at Short Stay. Do not shave (including legs and underarms) for at least 48 hours prior to the first CHG shower.  You may shave your face/neck. Please follow these instructions carefully:  1.  Shower with CHG Soap the night before surgery and the  morning of Surgery.  2.  If you choose to wash your hair, wash your hair first as usual with your  normal  shampoo.  3.  After you shampoo, rinse your hair and body thoroughly to remove the  shampoo.                           4.  Use CHG as you would any  other liquid soap.  You can apply chg directly  to the skin and  wash                       Gently with a scrungie or clean washcloth.  5.  Apply the CHG Soap to your body ONLY FROM THE NECK DOWN.   Do not use on face/ open                           Wound or open sores. Avoid contact with eyes, ears mouth and genitals (private parts).                       Wash face,  Genitals (private parts) with your normal soap.             6.  Wash thoroughly, paying special attention to the area where your surgery  will be performed.  7.  Thoroughly rinse your body with warm water from the neck down.  8.  DO NOT shower/wash with your normal soap after using and rinsing off  the CHG Soap.                9.  Pat yourself dry with a clean towel.            10.  Wear clean pajamas.            11.  Place clean sheets on your bed the night of your first shower and do not  sleep with pets. Day of Surgery : Do not apply any lotions/deodorants the morning of surgery.  Please wear clean clothes to the hospital/surgery center.  FAILURE TO FOLLOW THESE INSTRUCTIONS MAY RESULT IN THE CANCELLATION OF YOUR SURGERY PATIENT SIGNATURE_________________________________  NURSE SIGNATURE__________________________________  ________________________________________________________________________

## 2022-07-30 NOTE — Progress Notes (Signed)
Anesthesia Review:  YBF:XOVAN Holdsworth,NP  Cardiologist : none  Chest x-ray  CT Chest- 05/29/22  EKG : Echo  08/04/21  Stress test: Cardiac Cath :  Activity level: can do a flight of stairs without difficutly  Sleep Study/ CPAP : sleep Study- 08/22/21  has cpap  Fasting Blood Sugar :      / Checks Blood Sugar -- times a day:   Blood Thinner/ Instructions /Last Dose: ASA / Instructions/ Last Dose :

## 2022-08-01 ENCOUNTER — Ambulatory Visit (INDEPENDENT_AMBULATORY_CARE_PROVIDER_SITE_OTHER): Payer: Commercial Managed Care - PPO | Admitting: *Deleted

## 2022-08-01 ENCOUNTER — Other Ambulatory Visit: Payer: Self-pay

## 2022-08-01 ENCOUNTER — Encounter (HOSPITAL_COMMUNITY)
Admission: RE | Admit: 2022-08-01 | Discharge: 2022-08-01 | Disposition: A | Discharge status: Home / Self Care | Payer: Commercial Managed Care - PPO | Source: Ambulatory Visit | Attending: Surgery | Admitting: Surgery

## 2022-08-01 ENCOUNTER — Encounter (HOSPITAL_COMMUNITY): Payer: Self-pay

## 2022-08-01 VITALS — BP 128/93 | HR 75 | Temp 98.6°F | Resp 16 | Ht 68.0 in | Wt 263.0 lb

## 2022-08-01 DIAGNOSIS — Z01818 Encounter for other preprocedural examination: Secondary | ICD-10-CM

## 2022-08-01 DIAGNOSIS — J309 Allergic rhinitis, unspecified: Secondary | ICD-10-CM

## 2022-08-01 DIAGNOSIS — Z01812 Encounter for preprocedural laboratory examination: Secondary | ICD-10-CM | POA: Insufficient documentation

## 2022-08-01 HISTORY — DX: Personal history of urinary calculi: Z87.442

## 2022-08-01 LAB — CBC WITH DIFFERENTIAL/PLATELET
Abs Immature Granulocytes: 0 10*3/uL (ref 0.00–0.07)
Basophils Absolute: 0 10*3/uL (ref 0.0–0.1)
Basophils Relative: 1 %
Eosinophils Absolute: 0 10*3/uL (ref 0.0–0.5)
Eosinophils Relative: 1 %
HCT: 41.7 % (ref 36.0–46.0)
Hemoglobin: 13.9 g/dL (ref 12.0–15.0)
Immature Granulocytes: 0 %
Lymphocytes Relative: 41 %
Lymphs Abs: 2.4 10*3/uL (ref 0.7–4.0)
MCH: 30.5 pg (ref 26.0–34.0)
MCHC: 33.3 g/dL (ref 30.0–36.0)
MCV: 91.4 fL (ref 80.0–100.0)
Monocytes Absolute: 0.6 10*3/uL (ref 0.1–1.0)
Monocytes Relative: 9 %
Neutro Abs: 2.9 10*3/uL (ref 1.7–7.7)
Neutrophils Relative %: 48 %
Platelets: 258 10*3/uL (ref 150–400)
RBC: 4.56 MIL/uL (ref 3.87–5.11)
RDW: 12.2 % (ref 11.5–15.5)
WBC: 5.9 10*3/uL (ref 4.0–10.5)
nRBC: 0 % (ref 0.0–0.2)

## 2022-08-01 LAB — COMPREHENSIVE METABOLIC PANEL
ALT: 20 U/L (ref 0–44)
AST: 20 U/L (ref 15–41)
Albumin: 4 g/dL (ref 3.5–5.0)
Alkaline Phosphatase: 70 U/L (ref 38–126)
Anion gap: 8 (ref 5–15)
BUN: 7 mg/dL (ref 6–20)
CO2: 25 mmol/L (ref 22–32)
Calcium: 9.4 mg/dL (ref 8.9–10.3)
Chloride: 109 mmol/L (ref 98–111)
Creatinine, Ser: 1.05 mg/dL — ABNORMAL HIGH (ref 0.44–1.00)
GFR, Estimated: >60 mL/min (ref >60)
Glucose, Bld: 90 mg/dL (ref 70–99)
Potassium: 4.2 mmol/L (ref 3.5–5.1)
Sodium: 142 mmol/L (ref 135–145)
Total Bilirubin: 1.1 mg/dL (ref 0.3–1.2)
Total Protein: 7.2 g/dL (ref 6.5–8.1)

## 2022-08-01 LAB — TYPE AND SCREEN
ABO/RH(D): O POS
Antibody Screen: NEGATIVE

## 2022-08-13 ENCOUNTER — Ambulatory Visit (INDEPENDENT_AMBULATORY_CARE_PROVIDER_SITE_OTHER): Payer: Commercial Managed Care - PPO | Admitting: *Deleted

## 2022-08-13 DIAGNOSIS — J309 Allergic rhinitis, unspecified: Secondary | ICD-10-CM | POA: Diagnosis not present

## 2022-08-13 NOTE — H&P (Signed)
PROVIDER: Joya San, MD  MRN: [id number] DOB: 27-Jan-1981 DATE OF ENCOUNTER: 07/13/2022 Subjective   Chief Complaint: Bariatric pre-op (Sleeve)   History of Present Illness: Emily [name] is a 41 y.o. female who is seen today for preop for a sleeve gastrectomy in November. She was last seen by me in Nov 2022. Today her BMI is 41 and her weight is 270. She is followed by Jon Billings NP at [facility] in [place]. She has tried multiple meds including Saxenda and Contrave.   We had a nice discussion of her upcoming sleeve gastrectomy in November 21. Her surgery was a day after she is scheduled to arrive back from a cruise to the [place].  I answered her questions and reviewed the surgery again briefly.  Review of Systems: See HPI as well for other ROS.  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  Dyspnea on exertion  History of COVID-19  Insomnia  Interstitial pulmonary disease (CMS-HCC)  Irritable bowel syndrome with diarrhea  Migraine  Obesity  Olfactory impairment  OSA (obstructive sleep apnea)  Perennial allergic rhinitis  Peripheral neuropathic pain  Pes anserinus bursitis of right knee  Pulmonary artery hypertension (CMS-HCC)  Status post laparoscopic hysterectomy  Surgical menopause, symptomatic  Peroneal tendinitis  Synovitis of ankle  Tarsal coalition of right foot  Vitamin B12 deficiency  Vitamin D deficiency   Past Surgical History:  Procedure Laterality Date  HYSTERECTOMY    Allergies  Allergen Reactions  Codeine Diarrhea, Nausea And Vomiting and Vomiting  Other reaction(s): Vomiting   Current Outpatient  Medications on File Prior to Visit  Medication Sig Dispense Refill  albuterol (PROVENTIL) 2.5 mg /3 mL (0.083 %) nebulizer solution albuterol sulfate 2.5 mg/3 mL (0.083 %) solution for nebulization  buPROPion (WELLBUTRIN XL) 300 MG XL tablet bupropion HCl XL 300 mg 24 hr tablet, extended release  EPINEPHrine (ADRENALIN) 0.1 mg/mL injection Inject 0.1 mg into the vein once as needed for Anaphylaxis  fluticasone propionate (FLONASE) 50 mcg/actuation nasal spray Place into one nostril   No current facility-administered medications on file prior to visit.   Family History  Problem Relation Age of Onset  Obesity Mother  High blood pressure (Hypertension) Mother  Hyperlipidemia (Elevated cholesterol) Mother  Obesity Father  High blood pressure (Hypertension) Father  Hyperlipidemia (Elevated cholesterol) Father  Coronary Artery Disease (Blocked arteries around heart) Father  Diabetes Father    Social History   Tobacco Use  Smoking Status Former  Types: Cigarettes  Quit date: 2005  Years since quitting: 18.8  Smokeless Tobacco Never    Social History   Socioeconomic History  Marital status: Married  Tobacco Use  Smoking status: Former  Types: Cigarettes  Quit date: 2005  Years since quitting: 18.8  Smokeless tobacco: Never  Vaping Use  Vaping Use: Never used  Substance and Sexual Activity  Alcohol use: Yes  Comment: once a month  Drug use: Never   Objective:   Vitals:  07/13/22 1445  Temp: (!) 24.4 C (76 F)  SpO2: 96%  Weight: (!) 122.7 kg (270 lb 9.6 oz)  Height: 172.1 cm (5' 7.75")   Body mass index is 41.45 kg/m.  Physical Exam General: Obese white female HEENT:  Unremarkable Chest : Clear to auscultation Heart: Sinus rhythm without murmurs or gallops Breast: Not examined Abdomen: Nontender prior hysterectomy GU not performed Rectal performed Extremities full range of motion. No recent history of DVT Neuro alert and oriented x3. Motor and sensory  function grossly intact  Labs, Imaging and Diagnostic Testing:  UGI series was normal  Assessment and Plan:  Diagnoses and all orders for this visit:  Morbid obesity (CMS-HCC)    For sleeve gastrectomy Nov 21    Yoskar Murrillo Charna Busman, MD

## 2022-08-14 ENCOUNTER — Encounter (HOSPITAL_COMMUNITY)
Admission: RE | Disposition: A | Discharge status: Home / Self Care | Payer: Self-pay | Source: Ambulatory Visit | Attending: Surgery

## 2022-08-14 ENCOUNTER — Ambulatory Visit (HOSPITAL_BASED_OUTPATIENT_CLINIC_OR_DEPARTMENT_OTHER): Payer: Commercial Managed Care - PPO | Admitting: Anesthesiology

## 2022-08-14 ENCOUNTER — Other Ambulatory Visit: Payer: Self-pay

## 2022-08-14 ENCOUNTER — Ambulatory Visit (HOSPITAL_COMMUNITY): Payer: Commercial Managed Care - PPO | Admitting: Physician Assistant

## 2022-08-14 ENCOUNTER — Encounter (HOSPITAL_COMMUNITY): Payer: Self-pay | Admitting: Surgery

## 2022-08-14 ENCOUNTER — Observation Stay (HOSPITAL_COMMUNITY)
Admission: RE | Admit: 2022-08-14 | Discharge: 2022-08-16 | Disposition: A | Discharge status: Home / Self Care | Payer: Commercial Managed Care - PPO | Source: Ambulatory Visit | Attending: Surgery | Admitting: Surgery

## 2022-08-14 DIAGNOSIS — Z6841 Body Mass Index (BMI) 40.0 and over, adult: Secondary | ICD-10-CM

## 2022-08-14 DIAGNOSIS — J45909 Unspecified asthma, uncomplicated: Secondary | ICD-10-CM | POA: Insufficient documentation

## 2022-08-14 DIAGNOSIS — Z79899 Other long term (current) drug therapy: Secondary | ICD-10-CM | POA: Insufficient documentation

## 2022-08-14 DIAGNOSIS — G4730 Sleep apnea, unspecified: Secondary | ICD-10-CM

## 2022-08-14 DIAGNOSIS — Z01818 Encounter for other preprocedural examination: Secondary | ICD-10-CM

## 2022-08-14 DIAGNOSIS — Z87891 Personal history of nicotine dependence: Secondary | ICD-10-CM | POA: Diagnosis not present

## 2022-08-14 DIAGNOSIS — Z8616 Personal history of COVID-19: Secondary | ICD-10-CM | POA: Insufficient documentation

## 2022-08-14 DIAGNOSIS — Z9884 Bariatric surgery status: Principal | ICD-10-CM

## 2022-08-14 HISTORY — PX: UPPER GI ENDOSCOPY: SHX6162

## 2022-08-14 HISTORY — PX: BARIATRIC SURGERY: SHX1103

## 2022-08-14 HISTORY — DX: Essential (primary) hypertension: I10

## 2022-08-14 LAB — CBC
HCT: 46.2 % — ABNORMAL HIGH (ref 36.0–46.0)
Hemoglobin: 14.8 g/dL (ref 12.0–15.0)
MCH: 29.7 pg (ref 26.0–34.0)
MCHC: 32 g/dL (ref 30.0–36.0)
MCV: 92.6 fL (ref 80.0–100.0)
Platelets: 231 10*3/uL (ref 150–400)
RBC: 4.99 MIL/uL (ref 3.87–5.11)
RDW: 12.6 % (ref 11.5–15.5)
WBC: 10.7 10*3/uL — ABNORMAL HIGH (ref 4.0–10.5)
nRBC: 0 % (ref 0.0–0.2)

## 2022-08-14 LAB — CREATININE, SERUM
Creatinine, Ser: 1.29 mg/dL — ABNORMAL HIGH (ref 0.44–1.00)
GFR, Estimated: 53 mL/min — ABNORMAL LOW (ref >60)

## 2022-08-14 LAB — POCT PREGNANCY, URINE: Preg Test, Ur: NEGATIVE

## 2022-08-14 LAB — HEMOGLOBIN AND HEMATOCRIT, BLOOD
HCT: 45.3 % (ref 36.0–46.0)
Hemoglobin: 14.6 g/dL (ref 12.0–15.0)

## 2022-08-14 SURGERY — XI ROBOTIC GASTRIC SLEEVE RESECTION
Anesthesia: General

## 2022-08-14 MED ORDER — SUGAMMADEX SODIUM 500 MG/5ML IV SOLN
INTRAVENOUS | Status: DC | PRN
  Administered 2022-08-14: 400 mg via INTRAVENOUS

## 2022-08-14 MED ORDER — SODIUM CHLORIDE 0.9 % IR SOLN
Status: DC | PRN
  Administered 2022-08-14: 1000 mL

## 2022-08-14 MED ORDER — ONDANSETRON HCL 4 MG/2ML IJ SOLN
INTRAMUSCULAR | Status: AC
  Filled 2022-08-14: qty 2

## 2022-08-14 MED ORDER — ORAL CARE MOUTH RINSE: 15.0000 mL | Freq: Once | OROMUCOSAL | Status: AC

## 2022-08-14 MED ORDER — PROPOFOL 10 MG/ML IV BOLUS
INTRAVENOUS | Status: AC
  Filled 2022-08-14: qty 20

## 2022-08-14 MED ORDER — BUPIVACAINE LIPOSOME 1.3 % IJ SUSP
INTRAMUSCULAR | Status: AC
  Filled 2022-08-14: qty 20

## 2022-08-14 MED ORDER — ACETAMINOPHEN 160 MG/5ML PO SOLN
1000.0000 mg | Freq: Three times a day (TID) | ORAL | Status: DC
  Administered 2022-08-14: 1000 mg via ORAL

## 2022-08-14 MED ORDER — ACETAMINOPHEN 500 MG PO TABS
1000.0000 mg | ORAL_TABLET | ORAL | Status: AC
  Administered 2022-08-14: 1000 mg via ORAL
  Filled 2022-08-14: qty 2

## 2022-08-14 MED ORDER — ONDANSETRON HCL 4 MG/2ML IJ SOLN
INTRAMUSCULAR | Status: DC | PRN
  Administered 2022-08-14: 4 mg via INTRAVENOUS

## 2022-08-14 MED ORDER — BUPIVACAINE LIPOSOME 1.3 % IJ SUSP: 20.0000 mL | Freq: Once | INTRAMUSCULAR | Status: DC

## 2022-08-14 MED ORDER — DEXAMETHASONE SODIUM PHOSPHATE 10 MG/ML IJ SOLN
INTRAMUSCULAR | Status: DC | PRN
  Administered 2022-08-14: 5 mg via INTRAVENOUS

## 2022-08-14 MED ORDER — SODIUM CHLORIDE (PF) 0.9 % IJ SOLN
INTRAMUSCULAR | Status: DC | PRN
  Administered 2022-08-14: 10 mL via INTRAVENOUS

## 2022-08-14 MED ORDER — LIDOCAINE 2% (20 MG/ML) 5 ML SYRINGE
INTRAMUSCULAR | Status: DC | PRN
  Administered 2022-08-14: 60 mg via INTRAVENOUS

## 2022-08-14 MED ORDER — ROCURONIUM BROMIDE 10 MG/ML (PF) SYRINGE
PREFILLED_SYRINGE | INTRAVENOUS | Status: DC | PRN
  Administered 2022-08-14: 20 mg via INTRAVENOUS
  Administered 2022-08-14: 60 mg via INTRAVENOUS

## 2022-08-14 MED ORDER — LACTATED RINGERS IR SOLN
Status: DC | PRN
  Administered 2022-08-14: 1000 mL

## 2022-08-14 MED ORDER — MORPHINE SULFATE (PF) 2 MG/ML IV SOLN
1.0000 mg | INTRAVENOUS | Status: DC | PRN
  Administered 2022-08-14 – 2022-08-15 (×3): 2 mg via INTRAVENOUS
  Administered 2022-08-15: 3 mg via INTRAVENOUS
  Administered 2022-08-15 – 2022-08-16 (×3): 2 mg via INTRAVENOUS
  Filled 2022-08-14 (×4): qty 1
  Filled 2022-08-14: qty 2
  Filled 2022-08-14 (×2): qty 1

## 2022-08-14 MED ORDER — LACTATED RINGERS IV SOLN: INTRAVENOUS | Status: DC

## 2022-08-14 MED ORDER — ACETAMINOPHEN 160 MG/5ML PO SOLN
ORAL | Status: AC
  Filled 2022-08-14: qty 40.6

## 2022-08-14 MED ORDER — ONDANSETRON HCL 4 MG/2ML IJ SOLN: 4.0000 mg | INTRAMUSCULAR | Status: DC | PRN

## 2022-08-14 MED ORDER — CHLORHEXIDINE GLUCONATE 0.12 % MT SOLN
15.0000 mL | Freq: Once | OROMUCOSAL | Status: AC
  Administered 2022-08-14: 15 mL via OROMUCOSAL

## 2022-08-14 MED ORDER — FENTANYL CITRATE (PF) 250 MCG/5ML IJ SOLN
INTRAMUSCULAR | Status: DC | PRN
  Administered 2022-08-14 (×2): 50 ug via INTRAVENOUS
  Administered 2022-08-14: 100 ug via INTRAVENOUS

## 2022-08-14 MED ORDER — ACETAMINOPHEN 500 MG PO TABS: 1000.0000 mg | ORAL_TABLET | Freq: Once | ORAL | Status: DC

## 2022-08-14 MED ORDER — KCL IN DEXTROSE-NACL 20-5-0.45 MEQ/L-%-% IV SOLN
INTRAVENOUS | Status: DC
  Filled 2022-08-14 (×4): qty 1000

## 2022-08-14 MED ORDER — SCOPOLAMINE 1 MG/3DAYS TD PT72
1.0000 | MEDICATED_PATCH | TRANSDERMAL | Status: DC
  Administered 2022-08-14: 1.5 mg via TRANSDERMAL
  Filled 2022-08-14: qty 1

## 2022-08-14 MED ORDER — FENTANYL CITRATE PF 50 MCG/ML IJ SOSY
25.0000 ug | PREFILLED_SYRINGE | INTRAMUSCULAR | Status: DC | PRN
  Administered 2022-08-14: 50 ug via INTRAVENOUS

## 2022-08-14 MED ORDER — FENTANYL CITRATE PF 50 MCG/ML IJ SOSY
PREFILLED_SYRINGE | INTRAMUSCULAR | Status: AC
  Administered 2022-08-14: 50 ug via INTRAVENOUS
  Filled 2022-08-14: qty 3

## 2022-08-14 MED ORDER — SODIUM CHLORIDE 0.9 % IV SOLN
2.0000 g | INTRAVENOUS | Status: AC
  Administered 2022-08-14: 2 g via INTRAVENOUS
  Filled 2022-08-14: qty 2

## 2022-08-14 MED ORDER — OXYCODONE HCL 5 MG/5ML PO SOLN
ORAL | Status: AC
  Filled 2022-08-14: qty 5

## 2022-08-14 MED ORDER — FENTANYL CITRATE (PF) 250 MCG/5ML IJ SOLN
INTRAMUSCULAR | Status: AC
  Filled 2022-08-14: qty 5

## 2022-08-14 MED ORDER — CHLORHEXIDINE GLUCONATE CLOTH 2 % EX PADS: 6.0000 | MEDICATED_PAD | Freq: Once | CUTANEOUS | Status: DC

## 2022-08-14 MED ORDER — DEXAMETHASONE SODIUM PHOSPHATE 10 MG/ML IJ SOLN
INTRAMUSCULAR | Status: AC
  Filled 2022-08-14: qty 1

## 2022-08-14 MED ORDER — MIDAZOLAM HCL 2 MG/2ML IJ SOLN
INTRAMUSCULAR | Status: AC
  Filled 2022-08-14: qty 2

## 2022-08-14 MED ORDER — ENSURE MAX PROTEIN PO LIQD
2.0000 [oz_av] | ORAL | Status: DC
  Administered 2022-08-15 – 2022-08-16 (×11): 2 [oz_av] via ORAL

## 2022-08-14 MED ORDER — KETAMINE HCL 10 MG/ML IJ SOLN
INTRAMUSCULAR | Status: AC
  Filled 2022-08-14: qty 1

## 2022-08-14 MED ORDER — AMISULPRIDE (ANTIEMETIC) 5 MG/2ML IV SOLN: 10.0000 mg | Freq: Once | INTRAVENOUS | Status: DC | PRN

## 2022-08-14 MED ORDER — LIDOCAINE HCL (PF) 2 % IJ SOLN
INTRAMUSCULAR | Status: AC
  Filled 2022-08-14: qty 5

## 2022-08-14 MED ORDER — METOPROLOL TARTRATE 5 MG/5ML IV SOLN: 5.0000 mg | Freq: Four times a day (QID) | INTRAVENOUS | Status: DC | PRN

## 2022-08-14 MED ORDER — APREPITANT 40 MG PO CAPS
40.0000 mg | ORAL_CAPSULE | ORAL | Status: AC
  Administered 2022-08-14: 40 mg via ORAL
  Filled 2022-08-14: qty 1

## 2022-08-14 MED ORDER — OXYCODONE HCL 5 MG/5ML PO SOLN: 5.0000 mg | Freq: Four times a day (QID) | ORAL | Status: DC | PRN

## 2022-08-14 MED ORDER — PANTOPRAZOLE SODIUM 40 MG IV SOLR
40.0000 mg | Freq: Every day | INTRAVENOUS | Status: DC
  Administered 2022-08-14 – 2022-08-15 (×2): 40 mg via INTRAVENOUS
  Filled 2022-08-14 (×2): qty 10

## 2022-08-14 MED ORDER — LIDOCAINE HCL 2 % IJ SOLN
INTRAMUSCULAR | Status: AC
  Filled 2022-08-14: qty 20

## 2022-08-14 MED ORDER — SIMETHICONE 80 MG PO CHEW
80.0000 mg | CHEWABLE_TABLET | Freq: Four times a day (QID) | ORAL | Status: DC | PRN
  Administered 2022-08-14 – 2022-08-15 (×2): 80 mg via ORAL
  Filled 2022-08-14 (×3): qty 1

## 2022-08-14 MED ORDER — HEPARIN SODIUM (PORCINE) 5000 UNIT/ML IJ SOLN
5000.0000 [IU] | Freq: Three times a day (TID) | INTRAMUSCULAR | Status: DC
  Administered 2022-08-14 – 2022-08-16 (×6): 5000 [IU] via SUBCUTANEOUS
  Filled 2022-08-14 (×6): qty 1

## 2022-08-14 MED ORDER — ACETAMINOPHEN 500 MG PO TABS
1000.0000 mg | ORAL_TABLET | Freq: Three times a day (TID) | ORAL | Status: DC
  Administered 2022-08-14 – 2022-08-16 (×6): 1000 mg via ORAL
  Filled 2022-08-14 (×6): qty 2

## 2022-08-14 MED ORDER — LIDOCAINE 20MG/ML (2%) 15 ML SYRINGE OPTIME
INTRAMUSCULAR | Status: DC | PRN
  Administered 2022-08-14: 1.5 mg/kg/h via INTRAVENOUS

## 2022-08-14 MED ORDER — BUPIVACAINE LIPOSOME 1.3 % IJ SUSP
INTRAMUSCULAR | Status: DC | PRN
  Administered 2022-08-14: 20 mL

## 2022-08-14 MED ORDER — PROPOFOL 10 MG/ML IV BOLUS
INTRAVENOUS | Status: DC | PRN
  Administered 2022-08-14: 200 mg via INTRAVENOUS

## 2022-08-14 MED ORDER — HEPARIN SODIUM (PORCINE) 5000 UNIT/ML IJ SOLN
5000.0000 [IU] | INTRAMUSCULAR | Status: AC
  Administered 2022-08-14: 5000 [IU] via SUBCUTANEOUS
  Filled 2022-08-14: qty 1

## 2022-08-14 MED ORDER — MIDAZOLAM HCL 2 MG/2ML IJ SOLN
INTRAMUSCULAR | Status: DC | PRN
  Administered 2022-08-14: 2 mg via INTRAVENOUS

## 2022-08-14 MED ORDER — SODIUM CHLORIDE (PF) 0.9 % IJ SOLN
INTRAMUSCULAR | Status: AC
  Filled 2022-08-14: qty 10

## 2022-08-14 MED ORDER — KETAMINE HCL 10 MG/ML IJ SOLN
INTRAMUSCULAR | Status: DC | PRN
  Administered 2022-08-14: 30 mg via INTRAVENOUS

## 2022-08-14 MED ORDER — ROCURONIUM BROMIDE 10 MG/ML (PF) SYRINGE
PREFILLED_SYRINGE | INTRAVENOUS | Status: AC
  Filled 2022-08-14: qty 10

## 2022-08-14 SURGICAL SUPPLY — 67 items
APPLIER CLIP 5 13 M/L LIGAMAX5 (MISCELLANEOUS)
APPLIER CLIP ROT 10 11.4 M/L (STAPLE)
BLADE SURG 15 STRL LF DISP TIS (BLADE) ×1 IMPLANT
BLADE SURG 15 STRL SS (BLADE) ×1
CANNULA REDUC XI 12-8 STAPL (CANNULA) ×1
CANNULA REDUCER 12-8 DVNC XI (CANNULA) ×1 IMPLANT
CHLORAPREP W/TINT 26 (MISCELLANEOUS) ×1 IMPLANT
CLIP APPLIE 5 13 M/L LIGAMAX5 (MISCELLANEOUS) IMPLANT
CLIP APPLIE ROT 10 11.4 M/L (STAPLE) IMPLANT
COVER SURGICAL LIGHT HANDLE (MISCELLANEOUS) ×1 IMPLANT
DERMABOND ADVANCED .7 DNX12 (GAUZE/BANDAGES/DRESSINGS) ×1 IMPLANT
DRAPE ARM DVNC X/XI (DISPOSABLE) ×4 IMPLANT
DRAPE COLUMN DVNC XI (DISPOSABLE) ×1 IMPLANT
DRAPE DA VINCI XI ARM (DISPOSABLE) ×4
DRAPE DA VINCI XI COLUMN (DISPOSABLE) ×1
ELECT REM PT RETURN 15FT ADLT (MISCELLANEOUS) ×1 IMPLANT
GLOVE BIO SURGEON STRL SZ 6 (GLOVE) ×3 IMPLANT
GLOVE BIO SURGEON STRL SZ8 (GLOVE) ×2 IMPLANT
GLOVE INDICATOR 6.5 STRL GRN (GLOVE) ×3 IMPLANT
GOWN STRL REUS W/ TWL XL LVL3 (GOWN DISPOSABLE) ×3 IMPLANT
GOWN STRL REUS W/TWL XL LVL3 (GOWN DISPOSABLE) ×3
GRASPER SUT TROCAR 14GX15 (MISCELLANEOUS) ×1 IMPLANT
IRRIG SUCT STRYKERFLOW 2 WTIP (MISCELLANEOUS) ×1
IRRIGATION SUCT STRKRFLW 2 WTP (MISCELLANEOUS) ×1 IMPLANT
KIT BASIN OR (CUSTOM PROCEDURE TRAY) ×1 IMPLANT
KIT TURNOVER KIT A (KITS) IMPLANT
LUBRICANT JELLY K Y 4OZ (MISCELLANEOUS) IMPLANT
MARKER SKIN DUAL TIP RULER LAB (MISCELLANEOUS) ×1 IMPLANT
MAT PREVALON FULL STRYKER (MISCELLANEOUS) ×1 IMPLANT
NDL SPNL 22GX3.5 QUINCKE BK (NEEDLE) ×1 IMPLANT
NEEDLE SPNL 22GX3.5 QUINCKE BK (NEEDLE) ×1 IMPLANT
OBTURATOR OPTICAL STANDARD 8MM (TROCAR) ×1
OBTURATOR OPTICAL STND 8 DVNC (TROCAR) ×1
OBTURATOR OPTICALSTD 8 DVNC (TROCAR) ×1 IMPLANT
PACK CARDIOVASCULAR III (CUSTOM PROCEDURE TRAY) ×1 IMPLANT
RELOAD STAPLE 60 2.5 WHT DVNC (STAPLE) IMPLANT
RELOAD STAPLE 60 3.5 BLU DVNC (STAPLE) IMPLANT
RELOAD STAPLER 2.5X60 WHT DVNC (STAPLE) ×4 IMPLANT
RELOAD STAPLER 3.5X60 BLU DVNC (STAPLE) ×1 IMPLANT
SCISSORS LAP 5X35 DISP (ENDOMECHANICALS) IMPLANT
SEAL CANN UNIV 5-8 DVNC XI (MISCELLANEOUS) ×3 IMPLANT
SEAL XI 5MM-8MM UNIVERSAL (MISCELLANEOUS) ×3
SEALER VESSEL DA VINCI XI (MISCELLANEOUS) ×1
SEALER VESSEL EXT DVNC XI (MISCELLANEOUS) ×1 IMPLANT
SLEEVE GASTRECTOMY 36FR VISIGI (MISCELLANEOUS) ×1 IMPLANT
SOL ANTI FOG 6CC (MISCELLANEOUS) ×1 IMPLANT
SOLUTION ELECTROLUBE (MISCELLANEOUS) ×1 IMPLANT
SPIKE FLUID TRANSFER (MISCELLANEOUS) ×1 IMPLANT
STAPLER 60 DA VINCI SURE FORM (STAPLE) ×1
STAPLER 60 SUREFORM DVNC (STAPLE) ×1 IMPLANT
STAPLER CANNULA SEAL DVNC XI (STAPLE) ×1 IMPLANT
STAPLER CANNULA SEAL XI (STAPLE) ×1
STAPLER RELOAD 2.5X60 WHITE (STAPLE) ×4
STAPLER RELOAD 2.5X60 WHT DVNC (STAPLE) ×4
STAPLER RELOAD 3.5X60 BLU DVNC (STAPLE) ×1
STAPLER RELOAD 3.5X60 BLUE (STAPLE) ×1
SUT ETHIBOND 0 36 GRN (SUTURE) IMPLANT
SUT MNCRL AB 4-0 PS2 18 (SUTURE) ×2 IMPLANT
SUT VICRYL 0 TIES 12 18 (SUTURE) ×1 IMPLANT
SYR 10ML ECCENTRIC (SYRINGE) IMPLANT
SYR 20ML LL LF (SYRINGE) ×1 IMPLANT
TOWEL OR 17X26 10 PK STRL BLUE (TOWEL DISPOSABLE) ×1 IMPLANT
TRAY FOLEY MTR SLVR 16FR STAT (SET/KITS/TRAYS/PACK) IMPLANT
TROCAR ADV FIXATION 5X100MM (TROCAR) IMPLANT
TROCAR XCEL NON-BLD 5MMX100MML (ENDOMECHANICALS) ×1 IMPLANT
TUBE CALIBRATION LAPBAND (TUBING) IMPLANT
TUBING INSUFFLATION 10FT LAP (TUBING) ×1 IMPLANT

## 2022-08-14 NOTE — Progress Notes (Signed)

## 2022-08-14 NOTE — Anesthesia Procedure Notes (Signed)
Procedure Name: Intubation Date/Time: 08/14/2022 7:36 AM  Performed by: Elyn Peers, CRNAPre-anesthesia Checklist: Patient identified, Emergency Drugs available, Suction available, Patient being monitored and Timeout performed Patient Re-evaluated:Patient Re-evaluated prior to induction Oxygen Delivery Method: Circle system utilized Preoxygenation: Pre-oxygenation with 100% oxygen Induction Type: IV induction Ventilation: Mask ventilation without difficulty Laryngoscope Size: Miller and 3 Grade View: Grade I Tube type: Oral Tube size: 7.5 mm Number of attempts: 1 Airway Equipment and Method: Stylet Placement Confirmation: ETT inserted through vocal cords under direct vision, positive ETCO2 and breath sounds checked- equal and bilateral Secured at: 23 cm Tube secured with: Tape Dental Injury: Teeth and Oropharynx as per pre-operative assessment

## 2022-08-14 NOTE — Op Note (Signed)
   Surgeon: Wenda Low, MD, FACS  Asst:  Ivar Drape, MD   August 14, 2022 Anes:  General endotracheal  Procedure: Robotic sleeve gastrectomy and upper endoscopy  Diagnosis: Morbid obesity  Complications: none  EBL:   minimal cc  Description of Procedure:  The patient was take to OR 2 and given general anesthesia.  The abdomen was prepped with Chloroprep and draped sterilely.  A timeout was performed.  Access to the abdomen was achieved with a 5 mm Optiview through the left upper quadrant.  Following insufflation, the state of the abdomen was found to be free of adhesions.  The ViSiGi 36Fr tube was inserted to deflate the stomach and was pulled back into the esophagus.  Four trocars were placed including a 12 mm for the robotic stapler.    The pylorus was identified and we measured 5 cm back and marked the antrum.  At that point we began dissection to take down the greater curvature of the stomach using the vessel sealer.  This dissection was taken all the way up to the left crus.  Posterior attachments of the stomach were also taken down.    The ViSiGi tube was then passed into the antrum and suction applied so that it was snug along the lessor curvature.  The "crow's foot" or incisura was identified.  The sleeve gastrectomy was begun using the Sureform platform stapler beginning with a 6 cm blue load followed by multiple 6 cm white loads.  When the sleeve was complete the tube was taken off suction and insufflated briefly.  The tube was withdrawn.  Upper endoscopy was then performed by Dr. Daphine Deutscher and a cylindrical sleeve was found without evidence of bleeding or leaks.     The specimen was extracted through the 15 trocar site.  Local block was provided by infiltrating abdomen as a TAP block and then closed 4-0 Monocryl and Dermabond.    Matt B. Daphine Deutscher, MD, [facility], [place] [phone]

## 2022-08-14 NOTE — Progress Notes (Signed)
PHARMACY CONSULT FOR:  Risk Assessment for Post-Discharge VTE Following Bariatric Surgery  Post-Discharge VTE Risk Assessment: This patient's probability of 30-day post-discharge VTE is increased due to the factors marked: x Sleeve gastrectomy   Liver disorder (transplant, cirrhosis, or nonalcoholic steatohepatitis)   Hx of VTE   Hemorrhage requiring transfusion   GI perforation, leak, or obstruction   ====================================================    Female    Age >/=60 years    BMI >/=50 kg/m2    CHF    Dyspnea at Rest    Paraplegia   x Non-gastric-band surgery    Operation Time >/=3 hr    Return to OR     Length of Stay >/= 3 d   Hypercoagulable condition   Significant venous stasis    Predicted probability of 30-day post-discharge VTE: 0.16% mild  Other patient-specific factors to consider: none  Recommendation for Discharge: No pharmacologic prophylaxis post-discharge  Emily [name] is a 41 y.o. female who underwent  Sleeve gastrectomy (procedure) on 08/14/22.   Case start: 0756 Case End 0903   Allergies  Allergen Reactions   Codeine Diarrhea and Nausea And Vomiting    Patient Measurements: Height: 5\' 8"  (172.7 cm) Weight: 120.7 kg (266 lb 3.2 oz) IBW/kg (Calculated) : 63.9 Body mass index is 40.48 kg/m.  No results for input(s): "WBC", "HGB", "HCT", "PLT", "APTT", "CREATININE", "LABCREA", "CREAT24HRUR", "MG", "PHOS", "ALBUMIN", "PROT", "AST", "ALT", "ALKPHOS", "BILITOT", "BILIDIR", "IBILI" in the last 72 hours. Estimated Creatinine Clearance: 95.4 mL/min (A) (by C-G formula based on SCr of 1.05 mg/dL (H)).    Past Medical History:  Diagnosis Date   Anemia    Asthma    sports induced inhalers   Bipolar 1 disorder (HCC)    Conversion disorder    History of kidney stones    Insomnia    Migraines    migraines   MRSA infection 2010   Ovarian cyst    left 2.5cm   Sleep apnea    has cpap   Vitamin B 12 deficiency    Vitamin D deficiency      Virgie Kunda S. 2011, PharmD, BCPS Clinical Staff Pharmacist [email or web address]  Merilynn Finland, Merilynn Finland 08/14/2022,12:37 PM

## 2022-08-14 NOTE — Interval H&P Note (Signed)
History and Physical Interval Note:  08/14/2022 7:27 AM  Emily [name]  has presented today for surgery, with the diagnosis of morbid obesity.  The various methods of treatment have been discussed with the patient and family. After consideration of risks, benefits and other options for treatment, the patient has consented to  Procedure(s): ROBOTIC SLEEVE GASTRECTOMY (N/A) UPPER GI ENDOSCOPY (N/A) as a surgical intervention.  The patient's history has been reviewed, patient examined, no change in status, stable for surgery.  I have reviewed the patient's chart and labs.  Questions were answered to the patient's satisfaction.     Valarie Merino

## 2022-08-14 NOTE — Transfer of Care (Signed)
Immediate Anesthesia Transfer of Care Note  Patient: Emily [name]  Procedure(s) Performed: ROBOTIC SLEEVE GASTRECTOMY UPPER GI ENDOSCOPY  Patient Location: PACU  Anesthesia Type:General  Level of Consciousness: awake, drowsy, and responds to stimulation  Airway & Oxygen Therapy: Patient Spontanous Breathing and Patient connected to face mask oxygen  Post-op Assessment: Report given to RN and Post -op Vital signs reviewed and stable  Post vital signs: Reviewed and stable  Last Vitals:  Vitals Value Taken Time  BP 120/96 08/14/22 0914  Temp 36.8 C 08/14/22 0914  Pulse 74 08/14/22 0915  Resp 12 08/14/22 0915  SpO2 99 % 08/14/22 0915  Vitals shown include unvalidated device data.  Last Pain:  Vitals:   08/14/22 0626  TempSrc: Oral         Complications: No notable events documented.

## 2022-08-14 NOTE — Anesthesia Preprocedure Evaluation (Signed)
Anesthesia Evaluation  Patient identified by MRN, date of birth, ID band Patient awake    Reviewed: Allergy & Precautions, NPO status , Patient's Chart, lab work & pertinent test results  Airway Mallampati: II  TM Distance: >3 FB Neck ROM: Full    Dental  (+) Dental Advisory Given   Pulmonary asthma , sleep apnea , former smoker   breath sounds clear to auscultation       Cardiovascular negative cardio ROS  Rhythm:Regular Rate:Normal     Neuro/Psych  Headaches    GI/Hepatic negative GI ROS, Neg liver ROS,,,  Endo/Other  negative endocrine ROS    Renal/GU negative Renal ROS     Musculoskeletal   Abdominal   Peds  Hematology negative hematology ROS (+)   Anesthesia Other Findings   Reproductive/Obstetrics                             Anesthesia Physical Anesthesia Plan  ASA: 3  Anesthesia Plan:    Post-op Pain Management: Tylenol PO (pre-op)*, Ketamine IV* and Lidocaine infusion*   Induction:   PONV Risk Score and Plan: 3 and Dexamethasone, Ondansetron, Midazolam, Scopolamine patch - Pre-op and Treatment may vary due to age or medical condition  Airway Management Planned: Oral ETT  Additional Equipment: None  Intra-op Plan:   Post-operative Plan: Extubation in OR  Informed Consent: I have reviewed the patients History and Physical, chart, labs and discussed the procedure including the risks, benefits and alternatives for the proposed anesthesia with the patient or authorized representative who has indicated his/her understanding and acceptance.     Dental advisory given  Plan Discussed with: CRNA  Anesthesia Plan Comments:        Anesthesia Quick Evaluation

## 2022-08-14 NOTE — Anesthesia Postprocedure Evaluation (Signed)
Anesthesia Post Note  Patient: Emily [name]  Procedure(s) Performed: ROBOTIC SLEEVE GASTRECTOMY UPPER GI ENDOSCOPY     Patient location during evaluation: PACU Anesthesia Type: General Level of consciousness: awake and alert Pain management: pain level controlled Vital Signs Assessment: post-procedure vital signs reviewed and stable Respiratory status: spontaneous breathing, nonlabored ventilation, respiratory function stable and patient connected to nasal cannula oxygen Cardiovascular status: blood pressure returned to baseline and stable Postop Assessment: no apparent nausea or vomiting Anesthetic complications: no   No notable events documented.  Last Vitals:  Vitals:   08/14/22 1500 08/14/22 1525  BP: 121/81 123/76  Pulse: 70 73  Resp: 20   Temp: 36.5 C 36.9 C  SpO2: 96% (!) 89%    Last Pain:  Vitals:   08/14/22 1525  TempSrc: Oral  PainSc: 5                  Kennieth Rad

## 2022-08-15 ENCOUNTER — Encounter (HOSPITAL_COMMUNITY): Payer: Self-pay | Admitting: Surgery

## 2022-08-15 LAB — CBC WITH DIFFERENTIAL/PLATELET
Abs Immature Granulocytes: 0.03 10*3/uL (ref 0.00–0.07)
Basophils Absolute: 0 10*3/uL (ref 0.0–0.1)
Basophils Relative: 0 %
Eosinophils Absolute: 0 10*3/uL (ref 0.0–0.5)
Eosinophils Relative: 0 %
HCT: 41.4 % (ref 36.0–46.0)
Hemoglobin: 13.4 g/dL (ref 12.0–15.0)
Immature Granulocytes: 0 %
Lymphocytes Relative: 13 %
Lymphs Abs: 1.2 10*3/uL (ref 0.7–4.0)
MCH: 30.3 pg (ref 26.0–34.0)
MCHC: 32.4 g/dL (ref 30.0–36.0)
MCV: 93.7 fL (ref 80.0–100.0)
Monocytes Absolute: 1.4 10*3/uL — ABNORMAL HIGH (ref 0.1–1.0)
Monocytes Relative: 14 %
Neutro Abs: 7.1 10*3/uL (ref 1.7–7.7)
Neutrophils Relative %: 73 %
Platelets: 232 10*3/uL (ref 150–400)
RBC: 4.42 MIL/uL (ref 3.87–5.11)
RDW: 12.7 % (ref 11.5–15.5)
WBC: 9.8 10*3/uL (ref 4.0–10.5)
nRBC: 0 % (ref 0.0–0.2)

## 2022-08-15 MED ORDER — TRAMADOL HCL 50 MG PO TABS: 50.0000 mg | ORAL_TABLET | Freq: Four times a day (QID) | ORAL | 0 refills | Status: DC | PRN

## 2022-08-15 MED ORDER — PANTOPRAZOLE SODIUM 40 MG PO TBEC: 40.0000 mg | DELAYED_RELEASE_TABLET | Freq: Every day | ORAL | 0 refills | Status: DC

## 2022-08-15 MED ORDER — ONDANSETRON 4 MG PO TBDP: 4.0000 mg | ORAL_TABLET | Freq: Four times a day (QID) | ORAL | 0 refills | Status: DC | PRN

## 2022-08-15 NOTE — Progress Notes (Signed)
  Transition of Care [facility]) Screening Note   Patient Details  Name: Emily [name] Date of Birth: 12/10/1980   Transition of [facility]) CM/SW Contact:    Otelia Santee, LCSW Phone Number: 08/15/2022, 10:48 AM    Transition of Care Department [facility]) has reviewed patient and no TOC needs have been identified at this time. We will continue to monitor patient advancement through interdisciplinary progression rounds. If new patient transition needs arise, please place a TOC consult.

## 2022-08-15 NOTE — Progress Notes (Signed)
Patient alert and oriented. Patient is tolerating fluids, advanced to protein shake today, patient is tolerating well.  Reviewed Gastric sleeve discharge instructions with patient and patient is able to articulate understanding.  Provided information on BELT program, Support Group and [facility] outpatient pharmacy. All questions answered, will continue to monitor.   Pt desires to stay overnight due to pain control issues. MD is aware.  24hr fluid recall: .  Per dehydration protocol, will call pt to f/u within one week post op.

## 2022-08-15 NOTE — Discharge Instructions (Signed)

## 2022-08-15 NOTE — Discharge Summary (Signed)
Physician Discharge Summary  Patient ID: Emily [name] MRN: [id number] DOB/AGE: 1981/04/15 41 y.o.  PCP: Larae Grooms, NP  Admit date: 08/14/2022 Discharge date: 08/15/2022  Admission Diagnoses:  obesity  Discharge Diagnoses:  same  Principal Problem:   S/P laparoscopic sleeve gastrectomy   Surgery:  robotic Xi sleeve gastrectomy  Discharged Condition: improved  Hospital Course:   Had surgery on Tuesday.  Sleeve performed with robotic platform and Sureform stapler with 36 VisiG.  Begun on liquids over night and advanced.  Ready for discharge on PD 1.  Consults: none  Significant Diagnostic Studies: none    Discharge Exam: Blood pressure 131/74, pulse 68, temperature 98.5 F (36.9 C), temperature source Oral, resp. rate 18, height 5\' 8"  (1.727 m), weight 120.7 kg, last menstrual period 06/01/2015, SpO2 95 %. Minimal pain doing well  Disposition: Discharge disposition: 01-Home or Self Care       Discharge Instructions     Ambulate hourly while awake   Complete by: As directed    Call MD for:  difficulty breathing, headache or visual disturbances   Complete by: As directed    Call MD for:  persistant dizziness or light-headedness   Complete by: As directed    Call MD for:  persistant nausea and vomiting   Complete by: As directed    Call MD for:  redness, tenderness, or signs of infection (pain, swelling, redness, odor or green/yellow discharge around incision site)   Complete by: As directed    Call MD for:  severe uncontrolled pain   Complete by: As directed    Call MD for:  temperature >101 F   Complete by: As directed    Diet bariatric full liquid   Complete by: As directed    Discharge instructions   Complete by: As directed    May shower tomorrow and get incisions wet   Discharge wound care:   Complete by: As directed    Leave steri strips in place for ~ 10 days   Incentive spirometry   Complete by: As directed    Perform hourly while awake       Allergies as of 08/15/2022       Reactions   Codeine Diarrhea, Nausea And Vomiting        Medication List     TAKE these medications    albuterol 108 (90 Base) MCG/ACT inhaler Commonly known as: VENTOLIN HFA Inhale 1-2 puffs into the lungs every 6 (six) hours as needed for wheezing or shortness of breath.   B-D UF III MINI PEN NEEDLES 31G X 5 MM Misc Generic drug: Insulin Pen Needle 1 Units by Does not apply route daily.   buPROPion 300 MG 24 hr tablet Commonly known as: WELLBUTRIN XL Take 1 tablet (300 mg total) by mouth daily.   cholecalciferol 25 MCG (1000 UNIT) tablet Commonly known as: VITAMIN D3 Take 1,000 Units by mouth daily.   cyanocobalamin 1000 MCG tablet Commonly known as: VITAMIN B12 Take 1,000 mcg by mouth every other day.   EPINEPHrine 0.3 mg/0.3 mL Soaj injection Commonly known as: EPI-PEN Inject 0.3 mg into the muscle as needed for anaphylaxis.   fluticasone 50 MCG/ACT nasal spray Commonly known as: FLONASE Place 2 sprays into both nostrils daily as needed for allergies or rhinitis.   ipratropium 0.03 % nasal spray Commonly known as: ATROVENT Place 1 spray into both nostrils 2 (two) times daily as needed for rhinitis.   ondansetron 4 MG disintegrating tablet Commonly known as: ZOFRAN-ODT  Take 1 tablet (4 mg total) by mouth every 6 (six) hours as needed for nausea or vomiting.   pantoprazole 40 MG tablet Commonly known as: PROTONIX Take 1 tablet (40 mg total) by mouth daily.   pregabalin 75 MG capsule Commonly known as: LYRICA Take 75 mg by mouth daily. Last dose on 07/23/2022   scopolamine 1 MG/3DAYS Commonly known as: TRANSDERM-SCOP Place 1 patch (1.5 mg total) onto the skin every 3 (three) days. What changed: additional instructions   traMADol 50 MG tablet Commonly known as: ULTRAM Take 1 tablet (50 mg total) by mouth every 6 (six) hours as needed (pain).   vitamin E 180 MG (400 UNITS) capsule Take 400 Units by mouth  daily.               Discharge Care Instructions  (From admission, onward)           Start     Ordered   08/15/22 0000  Discharge wound care:       Comments: Leave steri strips in place for ~ 10 days   08/15/22 1348            Follow-up Information     Luretha Murphy, MD Follow up in 3 week(s).   Specialty: General Surgery Why: For routine follow up with Dr. Bennett Scrape information: [address] [place] [address] [phone]                 Signed: Valarie Merino 08/15/2022, 1:50 PM

## 2022-08-16 LAB — CBC WITH DIFFERENTIAL/PLATELET
Abs Immature Granulocytes: 0.02 10*3/uL (ref 0.00–0.07)
Basophils Absolute: 0 10*3/uL (ref 0.0–0.1)
Basophils Relative: 0 %
Eosinophils Absolute: 0 10*3/uL (ref 0.0–0.5)
Eosinophils Relative: 0 %
HCT: 38.2 % (ref 36.0–46.0)
Hemoglobin: 12 g/dL (ref 12.0–15.0)
Immature Granulocytes: 0 %
Lymphocytes Relative: 33 %
Lymphs Abs: 2.4 10*3/uL (ref 0.7–4.0)
MCH: 29.5 pg (ref 26.0–34.0)
MCHC: 31.4 g/dL (ref 30.0–36.0)
MCV: 93.9 fL (ref 80.0–100.0)
Monocytes Absolute: 0.8 10*3/uL (ref 0.1–1.0)
Monocytes Relative: 11 %
Neutro Abs: 4 10*3/uL (ref 1.7–7.7)
Neutrophils Relative %: 56 %
Platelets: 192 10*3/uL (ref 150–400)
RBC: 4.07 MIL/uL (ref 3.87–5.11)
RDW: 13.1 % (ref 11.5–15.5)
WBC: 7.2 10*3/uL (ref 4.0–10.5)
nRBC: 0 % (ref 0.0–0.2)

## 2022-08-16 MED ORDER — TRAMADOL HCL 50 MG PO TABS: 50.0000 mg | ORAL_TABLET | Freq: Four times a day (QID) | ORAL | 0 refills | Status: DC | PRN

## 2022-08-16 NOTE — Progress Notes (Signed)
2 Days Post-Op   Subjective/Chief Complaint: Feels better, tol diet, ambulating   Objective: Vital signs in last 24 hours: Temp:  [98.1 F (36.7 C)-98.7 F (37.1 C)] 98.2 F (36.8 C) (11/23 0606) Pulse Rate:  [66-72] 68 (11/23 0606) Resp:  [18] 18 (11/23 0606) BP: (97-131)/(52-74) 129/72 (11/23 0606) SpO2:  [91 %-95 %] 92 % (11/23 0606) Last BM Date : 08/14/22  Intake/Output from previous day: 11/22 0701 - 11/23 0700 In: 2496.7 [P.O.:720; I.V.:1776.7] Out: 1950 [Urine:1950] Intake/Output this shift: No intake/output data recorded.   Lab Results:  Recent Labs    08/15/22 0433 08/16/22 0437  WBC 9.8 7.2  HGB 13.4 12.0  HCT 41.4 38.2  PLT 232 192   BMET Recent Labs    08/14/22 1225  CREATININE 1.29*   PT/INR No results for input(s): "LABPROT", "INR" in the last 72 hours. ABG No results for input(s): "PHART", "HCO3" in the last 72 hours.  Invalid input(s): "PCO2", "PO2"  Studies/Results: No results found.  Anti-infectives: Anti-infectives (From admission, onward)    Start     Dose/Rate Route Frequency Ordered Stop   08/14/22 0600  cefoTEtan (CEFOTAN) 2 g in sodium chloride 0.9 % 100 mL IVPB        2 g 200 mL/hr over 30 Minutes Intravenous On call to O.R. 08/14/22 0524 08/14/22 0758       Assessment/Plan: POD 2 sleeve -dc home   Emelia Loron 08/16/2022

## 2022-08-17 ENCOUNTER — Telehealth (HOSPITAL_COMMUNITY): Payer: Self-pay | Admitting: *Deleted

## 2022-08-17 LAB — SURGICAL PATHOLOGY

## 2022-08-17 NOTE — Telephone Encounter (Signed)
1.  Tell me about your pain and pain management? Pt states that she her pain "comes and goes", but is a constant 4-5/10.  She is taking the Tylenol ad tramadol at night which is helping to control her discomfort and help her sleep. Encouraged pt to try continue medications and if something changes or worsens, to contact [facility] if still concerned.   2.  Let's talk about fluid intake.  How much total fluid are you taking in? Pt states that she is working to meet goal of 64 oz of fluid today.  Today is her first full day home. Pt has been able to consume approx. 31oz of fluid this morning.  Pt plans to drink another protein shake and more water to work towards the goal of 64 oz.  Pt instructed to assess status and suggestions daily utilizing Hydration Action Plan on discharge folder and to call [facility] if in the "red zone".   3.  How much protein have you taken in the last 2 days? Pt states that she is working to meet goal of goal of 60g of protein today.  Pt has already consumed one protein shake.  Pt plans to drink remainder of protein throughout the rest of the day to meet goal.  4.  Have you had nausea?  Tell me about when have experienced nausea and what you did to help? Pt denies nausea.   5.  Has the frequency or color changed with your urine? Pt states that she is urinating "fine" with no changes in frequency or urgency.     6.  Tell me what your incisions look like? "Incisions look fine". Pt denies a fever, chills.  Pt states incisions are not swollen, open, or draining.  Pt encouraged to call [facility] if incisions change.   7.  Have you been passing gas? BM? Pt states that she is having BMs. Last BM 08/17/22.     8.  If a problem or question were to arise who would you call?  Do you know contact numbers for [facility], [facility], and [facility]? Pt denies dehydration symptoms.  Pt can describe s/sx of dehydration.  Pt knows to call [facility] for surgical, [facility] for nutrition, and [facility] for non-urgent questions or concerns.    9.  How has the walking going? Pt states she is walking around and able to be active without difficulty.   10. Are you still using your incentive spirometer?  If so, how often? Pt states that she is doing the I.S. and achieving 1750.   Pt encouraged to use incentive spirometer, at least 10x every hour while awake until she sees the surgeon.  11.  How are your vitamins and calcium going?  How are you taking them? Pt states that she is taking her supplements and vitamins without difficulty.  Reminded patient that the first 30 days post-operatively are important for successful recovery.  Practice good hand hygiene, wearing a mask when appropriate (since optional in most places), and minimizing exposure to people who live outside of the home, especially if they are exhibiting any respiratory, GI, or illness-like symptoms.

## 2022-08-24 ENCOUNTER — Ambulatory Visit (INDEPENDENT_AMBULATORY_CARE_PROVIDER_SITE_OTHER): Payer: Commercial Managed Care - PPO

## 2022-08-24 DIAGNOSIS — J309 Allergic rhinitis, unspecified: Secondary | ICD-10-CM | POA: Diagnosis not present

## 2022-08-28 ENCOUNTER — Encounter: Payer: Commercial Managed Care - PPO | Attending: Surgery | Admitting: Dietician

## 2022-08-28 ENCOUNTER — Encounter: Payer: Self-pay | Admitting: Dietician

## 2022-08-28 VITALS — Ht 68.0 in | Wt 248.0 lb

## 2022-08-28 DIAGNOSIS — E669 Obesity, unspecified: Secondary | ICD-10-CM | POA: Diagnosis present

## 2022-08-28 NOTE — Progress Notes (Addendum)
2 Week Post-Operative Nutrition Class   Patient was seen on 08/28/2022 for Post-Operative Nutrition education at the [facility].    Surgery date: 08/14/2022 Surgery type: Sleeve Gastrectomy  Anthropometrics  Start weight at [facility]: 252 lbs (date: 09/11/2021)  Height: 68 in Weight today: 248.0 lbs. BMI: 37.71 kg/m2     Clinical  Medical hx: sleep apnea, bipolar, IBS Medications: see list  Labs: B 12 1379, vitamin D 72.3 Notable signs/symptoms: constipation  Any previous deficiencies? Yes; vitamin D, vitamin B12, iron  Bowel Habits: Every day to every other day no complaints   Body Composition Scale 08/28/2022  Current Body Weight 248.0  Total Body Fat % 42.4  Visceral Fat 12  Fat-Free Mass % 57.5   Total Body Water % 43.2  Muscle-Mass lbs 35.0  BMI 37.7  Body Fat Displacement          Torso  lbs 65.3         Left Leg  lbs 13.0         Right Leg  lbs 13.0         Left Arm  lbs 6.5         Right Arm   lbs 6.5      The following the learning objectives were met by the patient during this course: Identifies Phase 3 (Soft, High Proteins) Dietary Goals and will begin from 2 weeks post-operatively to 2 months post-operatively Identifies appropriate sources of fluids and proteins  Identifies appropriate fat sources and healthy verses unhealthy fat types   States protein recommendations and appropriate sources post-operatively Identifies the need for appropriate texture modifications, mastication, and bite sizes when consuming solids Identifies appropriate fat consumption and sources Identifies appropriate multivitamin and calcium sources post-operatively Describes the need for physical activity post-operatively and will follow MD recommendations States when to call healthcare provider regarding medication questions or post-operative complications   Handouts given during class include: Phase 3A: Soft, High Protein Diet Handout Phase 3 High Protein  Meals Healthy Fats   Follow-Up Plan: Patient will follow-up at [facility] in 6 weeks for 2 month post-op nutrition visit for diet advancement per MD.

## 2022-09-04 ENCOUNTER — Telehealth: Payer: Self-pay | Admitting: Dietician

## 2022-09-04 NOTE — Telephone Encounter (Signed)
RD called pt to verify fluid intake once starting soft, solid proteins 2 week post-bariatric surgery.   Daily Fluid intake:  Daily Protein intake:  Bowel Habits:   Concerns/issues:   Left voice message 

## 2022-09-05 ENCOUNTER — Ambulatory Visit (INDEPENDENT_AMBULATORY_CARE_PROVIDER_SITE_OTHER): Payer: Commercial Managed Care - PPO

## 2022-09-05 DIAGNOSIS — J309 Allergic rhinitis, unspecified: Secondary | ICD-10-CM | POA: Diagnosis not present

## 2022-09-14 ENCOUNTER — Ambulatory Visit (INDEPENDENT_AMBULATORY_CARE_PROVIDER_SITE_OTHER): Payer: Commercial Managed Care - PPO | Admitting: *Deleted

## 2022-09-14 ENCOUNTER — Ambulatory Visit: Payer: Self-pay

## 2022-09-14 DIAGNOSIS — J309 Allergic rhinitis, unspecified: Secondary | ICD-10-CM | POA: Diagnosis not present

## 2022-09-14 NOTE — Telephone Encounter (Signed)
Not a patient of [facility]

## 2022-09-14 NOTE — Telephone Encounter (Signed)
    Chief Complaint: Having migraine, diagnosed in the past. Can't take NSAIDS due to surgery. Asking for medication. Tylenol not helping. Symptoms: Right side Frequency: 3 days ago Pertinent Negatives: Patient denies  Disposition: [] ED /[] Urgent Care (no appt availability in office) / [] Appointment(In office/virtual)/ []  [facility]/ [] Home Care/ [] Refused Recommended Disposition /[] [facility]/ [x]  Follow-up with PCP Additional Notes: Please advise pt.  Answer Assessment - Initial Assessment Questions 1. LOCATION: "Where does it hurt?"      Right side and top 2. ONSET: "When did the headache start?" (Minutes, hours or days)      3 days ago 3. PATTERN: "Does the pain come and go, or has it been constant since it started?"     Constant 4. SEVERITY: "How bad is the pain?" and "What does it keep you from doing?"  (e.g., Scale 1-10; mild, moderate, or severe)   - MILD (1-3): doesn't interfere with normal activities    - MODERATE (4-7): interferes with normal activities or awakens from sleep    - SEVERE (8-10): excruciating pain, unable to do any normal activities        Now - 7 5. RECURRENT SYMPTOM: "Have you ever had headaches before?" If Yes, ask: "When was the last time?" and "What happened that time?"      Yes 6. CAUSE: "What do you think is causing the headache?"     Migraine 7. MIGRAINE: "Have you been diagnosed with migraine headaches?" If Yes, ask: "Is this headache similar?"      Yes 8. HEAD INJURY: "Has there been any recent injury to the head?"      No 9. OTHER SYMPTOMS: "Do you have any other symptoms?" (fever, stiff neck, eye pain, sore throat, cold symptoms)     Vision 10. PREGNANCY: "Is there any chance you are pregnant?" "When was your last menstrual period?"       No  Protocols used: Headache-A-AH

## 2022-09-18 NOTE — Telephone Encounter (Signed)
Please call patient and schedule an appointment per Clydie Braun.

## 2022-09-18 NOTE — Telephone Encounter (Signed)
Pt scheduled  

## 2022-09-18 NOTE — Telephone Encounter (Signed)
Patient will need an appt

## 2022-09-20 ENCOUNTER — Ambulatory Visit: Payer: Commercial Managed Care - PPO | Admitting: Family Medicine

## 2022-09-25 ENCOUNTER — Ambulatory Visit (INDEPENDENT_AMBULATORY_CARE_PROVIDER_SITE_OTHER): Payer: Commercial Managed Care - PPO

## 2022-09-25 DIAGNOSIS — J309 Allergic rhinitis, unspecified: Secondary | ICD-10-CM | POA: Diagnosis not present

## 2022-09-27 ENCOUNTER — Encounter: Payer: Self-pay | Admitting: Nurse Practitioner

## 2022-09-27 ENCOUNTER — Ambulatory Visit (INDEPENDENT_AMBULATORY_CARE_PROVIDER_SITE_OTHER): Payer: Commercial Managed Care - PPO | Admitting: Nurse Practitioner

## 2022-09-27 VITALS — BP 112/69 | HR 73 | Temp 97.8°F | Wt 238.0 lb

## 2022-09-27 DIAGNOSIS — G43909 Migraine, unspecified, not intractable, without status migrainosus: Secondary | ICD-10-CM | POA: Diagnosis not present

## 2022-09-27 DIAGNOSIS — Z23 Encounter for immunization: Secondary | ICD-10-CM

## 2022-09-27 NOTE — Progress Notes (Signed)
BP 112/69   Pulse 73   Temp 97.8 F (36.6 C) (Oral)   Wt 238 lb (108 kg)   LMP 06/01/2015 (Within Days)   SpO2 98%   BMI 36.19 kg/m    Subjective:    Patient ID: Emily [name], female    DOB: 1981/08/05, 42 y.o.   MRN: [id number]  HPI: Emily [name] is a 42 y.o. female  Chief Complaint  Patient presents with   Migraine    Patient states she has been having migraines for years. States she has been managing with ibuprofen and tylenol but can no longer taking ibuprofen due to bariatric surgery. States she has had Toradol in the past which helped but states she did not like the way it made her feel.    MIGRAINES Patient states she has been having migraines for years. States she has been managing with ibuprofen and tylenol but can no longer taking ibuprofen due to bariatric surgery. States she has had Toradol in the past which helped but states she did not like the way it made her feel.  Duration: years Onset: gradual Severity: moderate Ibuprofen and amitriptyline.    Relevant past medical, surgical, family and social history reviewed and updated as indicated. Interim medical history since our last visit reviewed. Allergies and medications reviewed and updated.  Review of Systems  Neurological:  Positive for headaches.    Per HPI unless specifically indicated above     Objective:    BP 112/69   Pulse 73   Temp 97.8 F (36.6 C) (Oral)   Wt 238 lb (108 kg)   LMP 06/01/2015 (Within Days)   SpO2 98%   BMI 36.19 kg/m   Wt Readings from Last 3 Encounters:  09/27/22 238 lb (108 kg)  08/28/22 248 lb (112.5 kg)  08/14/22 266 lb 3.2 oz (120.7 kg)    Physical Exam Vitals and nursing note reviewed.  Constitutional:      General: She is not in acute distress.    Appearance: Normal appearance. She is not ill-appearing, toxic-appearing or diaphoretic.  HENT:     Head: Normocephalic.     Right Ear: External ear normal.     Left Ear: External ear normal.     Nose: Nose  normal.     Mouth/Throat:     Mouth: Mucous membranes are moist.     Pharynx: Oropharynx is clear.  Eyes:     General:        Right eye: No discharge.        Left eye: No discharge.     Extraocular Movements: Extraocular movements intact.     Conjunctiva/sclera: Conjunctivae normal.     Pupils: Pupils are equal, round, and reactive to light.  Cardiovascular:     Rate and Rhythm: Normal rate and regular rhythm.     Heart sounds: No murmur heard. Pulmonary:     Effort: Pulmonary effort is normal. No respiratory distress.     Breath sounds: Normal breath sounds. No wheezing or rales.  Musculoskeletal:     Cervical back: Normal range of motion and neck supple.  Skin:    General: Skin is warm and dry.     Capillary Refill: Capillary refill takes less than 2 seconds.  Neurological:     General: No focal deficit present.     Mental Status: She is alert and oriented to person, place, and time. Mental status is at baseline.  Psychiatric:        Mood  and Affect: Mood normal.        Behavior: Behavior normal.        Thought Content: Thought content normal.        Judgment: Judgment normal.     Results for orders placed or performed during the hospital encounter of 08/14/22  CBC  Result Value Ref Range   WBC 10.7 (H) 4.0 - 10.5 K/uL   RBC 4.99 3.87 - 5.11 MIL/uL   Hemoglobin 14.8 12.0 - 15.0 g/dL   HCT 46.2 (H) 36.0 - 46.0 %   MCV 92.6 80.0 - 100.0 fL   MCH 29.7 26.0 - 34.0 pg   MCHC 32.0 30.0 - 36.0 g/dL   RDW 12.6 11.5 - 15.5 %   Platelets 231 150 - 400 K/uL   nRBC 0.0 0.0 - 0.2 %  Creatinine, serum  Result Value Ref Range   Creatinine, Ser 1.29 (H) 0.44 - 1.00 mg/dL   GFR, Estimated 53 (L) >60 mL/min  Hemoglobin and hematocrit, blood  Result Value Ref Range   Hemoglobin 14.6 12.0 - 15.0 g/dL   HCT 45.3 36.0 - 46.0 %  CBC with Differential  Result Value Ref Range   WBC 9.8 4.0 - 10.5 K/uL   RBC 4.42 3.87 - 5.11 MIL/uL   Hemoglobin 13.4 12.0 - 15.0 g/dL   HCT 41.4 36.0  - 46.0 %   MCV 93.7 80.0 - 100.0 fL   MCH 30.3 26.0 - 34.0 pg   MCHC 32.4 30.0 - 36.0 g/dL   RDW 12.7 11.5 - 15.5 %   Platelets 232 150 - 400 K/uL   nRBC 0.0 0.0 - 0.2 %   Neutrophils Relative % 73 %   Neutro Abs 7.1 1.7 - 7.7 K/uL   Lymphocytes Relative 13 %   Lymphs Abs 1.2 0.7 - 4.0 K/uL   Monocytes Relative 14 %   Monocytes Absolute 1.4 (H) 0.1 - 1.0 K/uL   Eosinophils Relative 0 %   Eosinophils Absolute 0.0 0.0 - 0.5 K/uL   Basophils Relative 0 %   Basophils Absolute 0.0 0.0 - 0.1 K/uL   Immature Granulocytes 0 %   Abs Immature Granulocytes 0.03 0.00 - 0.07 K/uL  CBC with Differential  Result Value Ref Range   WBC 7.2 4.0 - 10.5 K/uL   RBC 4.07 3.87 - 5.11 MIL/uL   Hemoglobin 12.0 12.0 - 15.0 g/dL   HCT 38.2 36.0 - 46.0 %   MCV 93.9 80.0 - 100.0 fL   MCH 29.5 26.0 - 34.0 pg   MCHC 31.4 30.0 - 36.0 g/dL   RDW 13.1 11.5 - 15.5 %   Platelets 192 150 - 400 K/uL   nRBC 0.0 0.0 - 0.2 %   Neutrophils Relative % 56 %   Neutro Abs 4.0 1.7 - 7.7 K/uL   Lymphocytes Relative 33 %   Lymphs Abs 2.4 0.7 - 4.0 K/uL   Monocytes Relative 11 %   Monocytes Absolute 0.8 0.1 - 1.0 K/uL   Eosinophils Relative 0 %   Eosinophils Absolute 0.0 0.0 - 0.5 K/uL   Basophils Relative 0 %   Basophils Absolute 0.0 0.0 - 0.1 K/uL   Immature Granulocytes 0 %   Abs Immature Granulocytes 0.02 0.00 - 0.07 K/uL  Pregnancy, urine POC  Result Value Ref Range   Preg Test, Ur NEGATIVE NEGATIVE  Surgical pathology  Result Value Ref Range   SURGICAL PATHOLOGY      SURGICAL PATHOLOGY CASE: [id number] PATIENT: [facility]  Clinical History: Morbid obesity (crm)     FINAL MICROSCOPIC DIAGNOSIS:  A. STOMACH, GREATER CURVATURE, SLEEVE RESECTION: - GASTRIC, OXYNTIC TYPE MUCOSA WITH FUNDIC GLAND POLYP(S). - NEGATIVE FOR HELICOBACTER ORGANISMS ON HE STAIN.  GROSS DESCRIPTION:  The specimen is received fresh and consists of a 17.0 x 3.1 x 2.6 cm piece of tan-pink  soft tissue with a stapled resection margin.  The serosal surface is smooth, hyperemic, with a small amount of attached tan-yellow adipose tissue.  The lumen contains a minimal amount of tan-red, hemorrhagic gelatinous material.  The mucosa is tan-pink, hyperemic, with normal mucosal folding.  Several small tan-pink sessile polyps are identified ranging from 0.1 to 0.3 cm in greatest dimension. The wall averages 0.2 cm in thickness.  Representative sections of the mucosal polyps are submitted in 1 cassette.  Craig Staggers 08/14/2022)    Fina l Diagnosis performed by Maureen Ralphs, MD.   Electronically signed 08/17/2022 Technical component performed at [facility], [place] [address], [place], [place] [address].  Professional component performed at [employer]. [facility], [place] [address] [place], [place] [address].  Immunohistochemistry Technical component (if applicable) was performed at [facility]. [address] [place], [place], [place] [address].   IMMUNOHISTOCHEMISTRY DISCLAIMER (if applicable): Some of these immunohistochemical stains may have been developed and the performance characteristics determine by [facility]. Some may not have been cleared or approved by the U.S. Food and Drug Administration. The FDA has determined that such clearance or approval is not necessary. This test is used for clinical purposes. It should not be regarded as investigational or for research. This laboratory is certified under the [place] (CLIA-88) as qualified to perform high complexity clinical laboratory testing.  The controls stained appropriately.       Assessment & Plan:   Problem List Items Addressed This Visit       Cardiovascular and Mediastinum   Migraine - Primary    Chronic. Had previously been controlled with Ibuprofen.  No longer able to take NSAIDS.  Amitriptyline has also not been  successful for her helping her migraines.  Will start Nurtec as needed for Migraines.  Side effects and benefits of medication discussed with patient during visit.      Relevant Medications   Rimegepant Sulfate (NURTEC) 75 MG TBDP   Other Visit Diagnoses     Need for influenza vaccination       Relevant Orders   Flu Vaccine QUAD 37mo+IM (Fluarix, Fluzone & Alfiuria Quad PF) (Completed)        Follow up plan: Return if symptoms worsen or fail to improve.

## 2022-09-27 NOTE — Assessment & Plan Note (Addendum)
Chronic. Had previously been controlled with Ibuprofen.  No longer able to take NSAIDS.  Amitriptyline has also not been successful for her helping her migraines.  Will start Nurtec as needed for Migraines.  Side effects and benefits of medication discussed with patient during visit.

## 2022-09-28 ENCOUNTER — Encounter: Payer: Self-pay | Admitting: Nurse Practitioner

## 2022-09-28 ENCOUNTER — Telehealth: Payer: Self-pay

## 2022-09-28 MED ORDER — NURTEC 75 MG PO TBDP: 75.0000 mg | ORAL_TABLET | Freq: Every day | ORAL | 1 refills | Status: DC | PRN

## 2022-09-28 NOTE — Telephone Encounter (Signed)
PA for Nurtec initiated and submitted via Cover My Meds. Key: [id number]

## 2022-10-04 ENCOUNTER — Ambulatory Visit: Payer: Self-pay

## 2022-10-04 ENCOUNTER — Encounter: Payer: Self-pay | Admitting: Family Medicine

## 2022-10-04 ENCOUNTER — Other Ambulatory Visit: Payer: Self-pay

## 2022-10-04 ENCOUNTER — Ambulatory Visit (INDEPENDENT_AMBULATORY_CARE_PROVIDER_SITE_OTHER): Payer: Commercial Managed Care - PPO | Admitting: Family Medicine

## 2022-10-04 VITALS — BP 102/88 | HR 81 | Temp 97.7°F | Resp 12 | Ht 68.0 in | Wt 232.7 lb

## 2022-10-04 DIAGNOSIS — J309 Allergic rhinitis, unspecified: Secondary | ICD-10-CM

## 2022-10-04 DIAGNOSIS — L209 Atopic dermatitis, unspecified: Secondary | ICD-10-CM | POA: Diagnosis not present

## 2022-10-04 DIAGNOSIS — J302 Other seasonal allergic rhinitis: Secondary | ICD-10-CM

## 2022-10-04 DIAGNOSIS — J4520 Mild intermittent asthma, uncomplicated: Secondary | ICD-10-CM

## 2022-10-04 DIAGNOSIS — L299 Pruritus, unspecified: Secondary | ICD-10-CM | POA: Insufficient documentation

## 2022-10-04 DIAGNOSIS — T7800XA Anaphylactic reaction due to unspecified food, initial encounter: Secondary | ICD-10-CM | POA: Diagnosis not present

## 2022-10-04 NOTE — Progress Notes (Signed)
Emily [name] [id number] Dept: [phone]  FOLLOW UP NOTE  Patient ID: Emily [name], female    DOB: 1981/03/16  Age: 42 y.o. 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 She denies concomitant cardiopulmonary or integumentary symptoms. Because she has a limited diet at this time, she is interested in evaluation of any possible food allergies. She is interested in lab evaluation at this time.  She reports her asthma has been moderately well-controlled with 1 asthma attack occurring about 1 month ago after inhaling a large amount of dust for which she used Xopenex with relief of symptoms.  Otherwise, she denies shortness of breath, cough, or wheeze with activity or rest.  She has not used her Pulmicort nebulizer for asthma rescue since her last visit to this clinic.  Allergic rhinitis is reported as moderately well-controlled with clear rhinorrhea and sneezing as the main symptoms.  She occasionally uses Flonase and is not currently taking an antihistamine.  She continues allergen immunotherapy directed toward pets, dust mite, grass pollen, and mold with no large or local reactions.  She reports a significant decrease in her symptoms of allergic rhinitis while continuing on allergen immunotherapy.  She continues to avoid lamb,  fish, shellfish, agave, and artificial sweeteners with no accidental ingestion or EpiPen use since her last visit to this clinic.  She does report recent development of diarrhea occurring after each ingestion of dairy products including lactose free dairy products.  She reports occasional red and itchy areas occurring on her skin in a flare in remission pattern.  She does not currently use a daily moisturizer and she is not currently using a medicated cream for red or itchy skin.  Her current medications are listed in the chart.   Drug Allergies:  Allergies  Allergen Reactions   Codeine Diarrhea and Nausea And Vomiting    Physical Exam: BP 102/88   Pulse 81   Temp 97.7 F (36.5 C)   Resp 12   Ht 5\' 8"  (1.727 m)   Wt 232 lb 11.2 oz (105.6 kg)   LMP 06/01/2015 (Within Days)   SpO2 97%   BMI 35.38 kg/m    Physical Exam Vitals reviewed.  Constitutional:      Appearance: Normal appearance.  HENT:     Head: Normocephalic and atraumatic.     Right Ear: Tympanic membrane normal.     Left Ear: Tympanic membrane normal.     Nose:     Comments: Bilateral naris slightly erythematous with clear nasal drainage noted.  Pharynx erythematous with no exudate.  Ears normal.  Eyes normal. Eyes:     Conjunctiva/sclera: Conjunctivae normal.  Cardiovascular:     Rate and Rhythm: Normal rate and regular rhythm.     Heart sounds: Normal heart sounds. No murmur heard. Pulmonary:     Effort: Pulmonary effort is normal.  Breath sounds: Normal breath sounds.     Comments: Lungs clear to auscultation Musculoskeletal:        General: Normal range of motion.     Cervical back: Normal range of motion and neck supple.  Skin:    General: Skin is warm and dry.  Neurological:     Mental Status: She is alert and oriented to person, place, and time.  Psychiatric:        Mood and Affect: Mood normal.        Behavior: Behavior normal.        Thought Content: Thought content normal.        Judgment:  Judgment normal.     Assessment and Plan: 1. Allergy with anaphylaxis due to food   2. Mild intermittent asthma without complication   3. Atopic dermatitis, unspecified type   4. Seasonal and perennial allergic rhinitis   5. Pruritus     No orders of the defined types were placed in this encounter.   Patient Instructions  Asthma Continue Xopenex once every 6 hours as needed for cough or wheeze You may use Xopenex 2 puffs 5-15 minutes before activity to decrease cough or wheeze For asthma flare, begin Pulmicort 0.5 mg via nebulizer twice a day to prevent cough or wheeze. You may stop in 2 weeks or until cough and wheeze free.   Allergic rhinitis Continue allergen avoidance measures directed toward pollen, mold, pet, and dust mite as listed below Continue Flonase nasal spray 2 sprays in each nostril once a day as needed for stuffy nose Continue ipratropium nasal spray 2 sprays in each nostril twice a day as needed for nasal symptoms Consider saline nasal rinses as needed for nasal symptoms. Use this before any medicated nasal sprays for best result Continue allergen immunotherapy per protocol and have access to an epinephrine autoinjector set  Pruritus You can begin an antihistamine once a day as needed for itch. Some examples of over the counter antihistamines include Zyrtec (cetirizine), Xyzal (levocetirizine), Allegra (fexofenadine), and Claritin (loratidine).   Atopic dermatitis Continue a daily moisturizer to red or itchy areas that are bothering you  Food allergy Continue strict avoidance of lamb.  Continue to avoid fish, shellfish, artificial sweeteners, and agave.  In case of an allergic reaction, take Benadryl 50 mg every 4 hours, and if life-threatening symptoms occur, inject with EpiPen 0.3 mg. Some labs have been ordered to help [place] evaluate your food allergies. We will call you when the results become available If your symptoms re-occur, begin a journal of events that  occurred for up to 6 hours before your symptoms began including foods and beverages consumed, soaps or perfumes you had contact with, and medications.   Call the clinic if this treatment plan is not working well for you.  Follow up in 3 months or sooner if needed.   Return in about 3 months (around 01/03/2023), or if symptoms worsen or fail to improve.    Thank you for the opportunity to care for this patient.  Please do not hesitate to contact me with questions.  Gareth Morgan, FNP [facility] [place] [place]

## 2022-10-04 NOTE — Patient Instructions (Addendum)
Asthma Continue Xopenex once every 6 hours as needed for cough or wheeze You may use Xopenex 2 puffs 5-15 minutes before activity to decrease cough or wheeze For asthma flare, begin Pulmicort 0.5 mg via nebulizer twice a day to prevent cough or wheeze. You may stop in 2 weeks or until cough and wheeze free.   Allergic rhinitis Continue allergen avoidance measures directed toward pollen, mold, pet, and dust mite as listed below Continue Flonase nasal spray 2 sprays in each nostril once a day as needed for stuffy nose Continue ipratropium nasal spray 2 sprays in each nostril twice a day as needed for nasal symptoms Consider saline nasal rinses as needed for nasal symptoms. Use this before any medicated nasal sprays for best result Continue allergen immunotherapy per protocol and have access to an epinephrine autoinjector set  Pruritus You can begin an antihistamine once a day as needed for itch. Some examples of over the counter antihistamines include Zyrtec (cetirizine), Xyzal (levocetirizine), Allegra (fexofenadine), and Claritin (loratidine).   Atopic dermatitis Continue a daily moisturizer to red or itchy areas that are bothering you  Food allergy Continue strict avoidance of lamb.  Continue to avoid fish, shellfish, artificial sweeteners, and agave.  In case of an allergic reaction, take Benadryl 50 mg every 4 hours, and if life-threatening symptoms occur, inject with EpiPen 0.3 mg. Some labs have been ordered to help [place] evaluate your food allergies. We will call you when the results become available If your symptoms re-occur, begin a journal of events that occurred for up to 6 hours before your symptoms began including foods and beverages consumed, soaps or perfumes you had contact with, and medications.   Call the clinic if this treatment plan is not working well for you.  Follow up in 3 months or sooner if needed.  Reducing Pollen Exposure The American Academy of Allergy, Asthma and  Immunology suggests the following steps to reduce your exposure to pollen during allergy seasons. Do not hang sheets or clothing out to dry; pollen may collect on these items. Do not mow lawns or spend time around freshly cut grass; mowing stirs up pollen. Keep windows closed at night.  Keep car windows closed while driving. Minimize morning activities outdoors, a time when pollen counts are usually at their highest. Stay indoors as much as possible when pollen counts or humidity is high and on windy days when pollen tends to remain in the air longer. Use air conditioning when possible.  Many air conditioners have filters that trap the pollen spores. Use a HEPA room air filter to remove pollen form the indoor air you breathe.  Control of Dog or Cat Allergen Avoidance is the best way to manage a dog or cat allergy. If you have a dog or cat and are allergic to dog or cats, consider removing the dog or cat from the home. If you have a dog or cat but don't want to find it a new home, or if your family wants a pet even though someone in the household is allergic, here are some strategies that may help keep symptoms at bay:  Keep the pet out of your bedroom and restrict it to only a few rooms. Be advised that keeping the dog or cat in only one room will not limit the allergens to that room. Don't pet, hug or kiss the dog or cat; if you do, wash your hands with soap and water. High-efficiency particulate air (HEPA) cleaners run continuously in a bedroom  or living room can reduce allergen levels over time. Regular use of a high-efficiency vacuum cleaner or a central vacuum can reduce allergen levels. Giving your dog or cat a bath at least once a week can reduce airborne allergen.   Control of Dust Mite Allergen Dust mites play a major role in allergic asthma and rhinitis. They occur in environments with high humidity wherever human skin is found. Dust mites absorb humidity from the atmosphere (ie, they  do not drink) and feed on organic matter (including shed human and animal skin). Dust mites are a microscopic type of insect that you cannot see with the naked eye. High levels of dust mites have been detected from mattresses, pillows, carpets, upholstered furniture, bed covers, clothes, soft toys and any woven material. The principal allergen of the dust mite is found in its feces. A gram of dust may contain 1,000 mites and 250,000 fecal particles. Mite antigen is easily measured in the air during house cleaning activities. Dust mites do not bite and do not cause harm to humans, other than by triggering allergies/asthma.  Ways to decrease your exposure to dust mites in your home:  1. Encase mattresses, box springs and pillows with a mite-impermeable barrier or cover  2. Wash sheets, blankets and drapes weekly in hot water (130 F) with detergent and dry them in a dryer on the hot setting.  3. Have the room cleaned frequently with a vacuum cleaner and a damp dust-mop. For carpeting or rugs, vacuuming with a vacuum cleaner equipped with a high-efficiency particulate air (HEPA) filter. The dust mite allergic individual should not be in a room which is being cleaned and should wait 1 hour after cleaning before going into the room.  4. Do not sleep on upholstered furniture (eg, couches).  5. If possible removing carpeting, upholstered furniture and drapery from the home is ideal. Horizontal blinds should be eliminated in the rooms where the person spends the most time (bedroom, study, television room). Washable vinyl, roller-type shades are optimal.  6. Remove all non-washable stuffed toys from the bedroom. Wash stuffed toys weekly like sheets and blankets above.  7. Reduce indoor humidity to less than 50%. Inexpensive humidity monitors can be purchased at most hardware stores. Do not use a humidifier as can make the problem worse and are not recommended.  Control of Mold Allergen Mold and fungi can  grow on a variety of surfaces provided certain temperature and moisture conditions exist.  Outdoor molds grow on plants, decaying vegetation and soil.  The major outdoor mold, Alternaria and Cladosporium, are found in very high numbers during hot and dry conditions.  Generally, a late Summer - Fall peak is seen for common outdoor fungal spores.  Rain will temporarily lower outdoor mold spore count, but counts rise rapidly when the rainy period ends.  The most important indoor molds are Aspergillus and Penicillium.  Dark, humid and poorly ventilated basements are ideal sites for mold growth.  The next most common sites of mold growth are the bathroom and the kitchen.  Outdoor [employer] Use air conditioning and keep windows closed Avoid exposure to decaying vegetation. Avoid leaf raking. Avoid grain handling. Consider wearing a face mask if working in moldy areas.  Indoor Mold Control Maintain humidity below 50%. Clean washable surfaces with 5% bleach solution. Remove sources e.g. Contaminated carpets.

## 2022-10-07 LAB — IGE MILK W/ COMPONENT REFLEX: F002-IgE Milk: 0.12 kU/L — AB

## 2022-10-07 LAB — ALLERGEN, MUTTON, F88: Allergen Lamb IgE: 0.44 kU/L — AB

## 2022-10-07 LAB — ALLERGEN PROFILE, FOOD-MEAT
Beef IgE: 0.8 kU/L — AB
Chicken IgE: <0.1 kU/L
Pork IgE: 0.37 kU/L — AB

## 2022-10-07 LAB — FOOD ALLERGY PROFILE
Allergen Corn, IgE: <0.1 kU/L
Clam IgE: <0.1 kU/L
Codfish IgE: <0.1 kU/L
Egg White IgE: <0.1 kU/L
Peanut IgE: <0.1 kU/L
Scallop IgE: <0.1 kU/L
Sesame Seed IgE: <0.1 kU/L
Shrimp IgE: <0.1 kU/L
Soybean IgE: <0.1 kU/L
Walnut IgE: <0.1 kU/L
Wheat IgE: <0.1 kU/L

## 2022-10-07 LAB — ALLERGEN, CASEIN, F78: F078-IgE Casein: <0.1 kU/L

## 2022-10-08 NOTE — Progress Notes (Signed)
Can you please let this patient know that her food allergy lab tests have resulted. Her milk was equivocal/low, pork low, lamb low, and beef moderate. Can you please find out of she is eating any of these foods. Remainder of the tests were negative and include egg, peanut, soy, shrimp, walnut, cod, scallop, wheat, corn, sesame and chicken. Thank you

## 2022-10-09 ENCOUNTER — Encounter: Payer: Commercial Managed Care - PPO | Attending: Surgery | Admitting: Skilled Nursing Facility1

## 2022-10-09 ENCOUNTER — Encounter: Payer: Self-pay | Admitting: Skilled Nursing Facility1

## 2022-10-09 VITALS — Ht 68.0 in | Wt 230.8 lb

## 2022-10-09 DIAGNOSIS — E6609 Other obesity due to excess calories: Secondary | ICD-10-CM | POA: Insufficient documentation

## 2022-10-09 DIAGNOSIS — Z6839 Body mass index (BMI) 39.0-39.9, adult: Secondary | ICD-10-CM | POA: Diagnosis present

## 2022-10-09 NOTE — Progress Notes (Signed)
Bariatric Nutrition Follow-Up Visit Medical Nutrition Therapy  Surgery date: 08/14/2022 Surgery type: Sleeve Gastrectomy  Anthropometrics  Start weight at [facility]: 252 lbs (date: 09/11/2021)  Height: 68 in Weight today: 230.8 lbs. BMI: 35.0 kg/m2     Clinical  Medical hx: sleep apnea, bipolar, IBS Medications: see list  Labs: B 12 1379, vitamin D 72.3 Notable signs/symptoms: constipation  Any previous deficiencies? Yes; vitamin D, vitamin B12, iron  Bowel Habits: Every day to every other day no complaints   Body Composition Scale 08/28/2022 10/09/2022  Current Body Weight 248.0 230.8  Total Body Fat % 42.4 41.0  Visceral Fat 12 11  Fat-Free Mass % 57.5 58.9   Total Body Water % 43.2 43.9  Muscle-Mass lbs 35.0 34.2  BMI 37.7 35.0  Body Fat Displacement           Torso  lbs 65.3 58.6         Left Leg  lbs 13.0 11.7         Right Leg  lbs 13.0 11.7         Left Arm  lbs 6.5 5.8         Right Arm   lbs 6.5 5.8     Lifestyle & Dietary Hx  Pt states she has found she is allergic/sensitive to dairy since her surgery. Pt states she is also allergic to beef, lamb, and pork and can not have fin fish in the house due to the smell and can not have shell fish in house due to brother's allergy. Pt states she has had bad reactions to Stevia, so finding a protein powder or shake has been challenging.  Pt states she needs help finding a new protein powder because of her sensitivity to dairy and Stevia. Pt states she is trying to get into [facility] to increase her physical activity. Pt states her doctor has put in a referral for her.  Pt states she isn't able to tolerate large amounts of foods.  Pt states she is using her phone timer to remind her to drink enough fluid throughout the day. Pt states she drinks 32 ounces of water for every 12 ounces of Gatorade zero.   Estimated daily fluid intake: ~60 oz Estimated daily protein intake: 55-62 g Supplements: multivitamin and calcium tablets   Current average weekly physical activity: ADL's, walking around at work    24-Hr Dietary Recall First Meal: 1 egg  Snack: None reported. Second Meal: 1 ounce of chicken or [place]  Snack: 1 ounce of chicken or [place] Third Meal: 1 egg omelette with 1/2 [place]  Snack: None reported.  Beverages: water, Gatorade zero   Post-Op Goals/ Signs/ Symptoms Using straws: no Drinking while eating: no Chewing/swallowing difficulties: no Changes in vision: no Changes to mood/headaches: no Hair loss/changes to skin/nails: some Difficulty focusing/concentrating: no Sweating: no Limb weakness: no Dizziness/lightheadedness: no Palpitations: no Carbonated/caffeinated beverages: no N/V/D/C/Gas: yes, from newly diagnosed allergies Abdominal pain: no Dumping syndrome: no    NUTRITION DIAGNOSIS  Overweight/obesity ([place]-3.3) related to past poor dietary habits and physical inactivity as evidenced by completed bariatric surgery and following dietary guidelines for continued weight loss and healthy nutrition status.     NUTRITION INTERVENTION Nutrition counseling (C-1) and education (E-2) to facilitate bariatric surgery goals, including: Diet advancement to the next phase (phase 4) now including fruits an vegetables. The importance of consuming adequate calories as well as certain nutrients daily due to the body's need for essential vitamins, minerals, and fats The importance  of daily physical activity and to reach a goal of at least 150 minutes of moderate to vigorous physical activity weekly (or as directed by their physician) due to benefits such as increased musculature and improved lab values The importance of intuitive eating specifically learning hunger-satiety cues and understanding the importance of learning a new body: The importance of mindful eating to avoid grazing behaviors  Educated pt on the importance of incorporating whole fruits and vegetables into diet as pt's diet phase advances.    Handouts Provided Include  Plant Based Protein Supplements Phase 4  Learning Style & Readiness for Change Teaching method utilized: Visual & Auditory  Demonstrated degree of understanding via: Teach Back  Readiness Level: Change in progress. Barriers to learning/adherence to lifestyle change: None identified.   RD's Notes for Next Visit Assess adherence to pt chosen goals  MONITORING & EVALUATION Dietary intake, weekly physical activity, body weight.  Next Steps Patient is to follow-up in 3 months for 5 month post-op follow-up.

## 2022-10-11 ENCOUNTER — Ambulatory Visit (INDEPENDENT_AMBULATORY_CARE_PROVIDER_SITE_OTHER): Payer: Commercial Managed Care - PPO | Admitting: Nurse Practitioner

## 2022-10-11 ENCOUNTER — Encounter: Payer: Self-pay | Admitting: Nurse Practitioner

## 2022-10-11 VITALS — BP 120/73 | HR 67 | Temp 97.9°F | Wt 234.5 lb

## 2022-10-11 DIAGNOSIS — B372 Candidiasis of skin and nail: Secondary | ICD-10-CM

## 2022-10-11 MED ORDER — NYSTATIN 100000 UNIT/GM EX CREA: 1.0000 | TOPICAL_CREAM | Freq: Two times a day (BID) | CUTANEOUS | 1 refills | Status: DC

## 2022-10-11 NOTE — Progress Notes (Signed)
BP 120/73   Pulse 67   Temp 97.9 F (36.6 C) (Oral)   Wt 234 lb 8 oz (106.4 kg)   LMP 06/01/2015 (Within Days)   SpO2 97%   BMI 35.66 kg/m    Subjective:    Patient ID: Emily [name], female    DOB: [birth year], 42 y.o.   MRN: [id number]  HPI: Emily [name] is a 42 y.o. female  Chief Complaint  Patient presents with   Rash    Pt states she noticed a rash under her R breast yesterday. States it is very red, raw, and painful    RASH Duration:  days  Location: trunk  Itching: no Burning: yes Redness: yes Oozing: no Scaling: no Blisters: no Painful: yes Fevers: no Change in detergents/soaps/personal care products: no Recent illness: no Recent travel:no History of same: no Context: worse Alleviating factors:  keeping area dry Treatments attempted:nothing Shortness of breath: no  Throat/tongue swelling: no Myalgias/arthralgias: no  Relevant past medical, surgical, family and social history reviewed and updated as indicated. Interim medical history since our last visit reviewed. Allergies and medications reviewed and updated.  Review of Systems  Skin:  Positive for rash.    Per HPI unless specifically indicated above     Objective:    BP 120/73   Pulse 67   Temp 97.9 F (36.6 C) (Oral)   Wt 234 lb 8 oz (106.4 kg)   LMP 06/01/2015 (Within Days)   SpO2 97%   BMI 35.66 kg/m   Wt Readings from Last 3 Encounters:  10/11/22 234 lb 8 oz (106.4 kg)  10/09/22 230 lb 12.8 oz (104.7 kg)  10/04/22 232 lb 11.2 oz (105.6 kg)    Physical Exam Vitals and nursing note reviewed.  Constitutional:      General: She is not in acute distress.    Appearance: Normal appearance. She is normal weight. She is not ill-appearing, toxic-appearing or diaphoretic.  HENT:     Head: Normocephalic.     Right Ear: External ear normal.     Left Ear: External ear normal.     Nose: Nose normal.     Mouth/Throat:     Mouth: Mucous membranes are moist.     Pharynx: Oropharynx is  clear.  Eyes:     General:        Right eye: No discharge.        Left eye: No discharge.     Extraocular Movements: Extraocular movements intact.     Conjunctiva/sclera: Conjunctivae normal.     Pupils: Pupils are equal, round, and reactive to light.  Cardiovascular:     Rate and Rhythm: Normal rate and regular rhythm.     Heart sounds: No murmur heard. Pulmonary:     Effort: Pulmonary effort is normal. No respiratory distress.     Breath sounds: Normal breath sounds. No wheezing or rales.  Musculoskeletal:     Cervical back: Normal range of motion and neck supple.  Skin:    General: Skin is warm and dry.     Capillary Refill: Capillary refill takes less than 2 seconds.       Neurological:     General: No focal deficit present.     Mental Status: She is alert and oriented to person, place, and time. Mental status is at baseline.  Psychiatric:        Mood and Affect: Mood normal.        Behavior: Behavior normal.  Thought Content: Thought content normal.        Judgment: Judgment normal.     Results for orders placed or performed in visit on 10/04/22  Food Allergy Profile  Result Value Ref Range   Class Description Allergens Comment    Egg White IgE <0.10 Class 0 kU/L   Peanut IgE <0.10 Class 0 kU/L   Soybean IgE <0.10 Class 0 kU/L   Milk IgE CANCELED kU/L   Clam IgE <0.10 Class 0 kU/L   Shrimp IgE <0.10 Class 0 kU/L   Walnut IgE <0.10 Class 0 kU/L   Codfish IgE <0.10 Class 0 kU/L   Scallop IgE <0.10 Class 0 kU/L   Wheat IgE <0.10 Class 0 kU/L   Allergen Corn, IgE <0.10 Class 0 kU/L   Sesame Seed IgE <0.10 Class 0 kU/L  Allergen Profile, Food-Meat  Result Value Ref Range   Pork IgE 0.37 (A) Class I kU/L   Beef IgE 0.80 (A) Class II kU/L   Chicken IgE <0.10 Class 0 kU/L  Allergen, Mutton, f88  Result Value Ref Range   Allergen Lamb IgE 0.44 (A) Class I kU/L  Allergen, Casein, f78  Result Value Ref Range   F078-IgE Casein <0.10 Class 0 kU/L  IgE Milk w/  Component Reflex  Result Value Ref Range   F002-IgE Milk 0.12 (A) Class 0/I kU/L      Assessment & Plan:   Problem List Items Addressed This Visit   None Visit Diagnoses     Yeast dermatitis    -  Primary   Will treat with nystatin cream. Recommend barrier cream to kep skin from rubbing. Also recommend something to keep breast and abdominal skin apart.   Relevant Medications   nystatin cream (MYCOSTATIN)        Follow up plan: No follow-ups on file.

## 2022-10-16 ENCOUNTER — Ambulatory Visit (INDEPENDENT_AMBULATORY_CARE_PROVIDER_SITE_OTHER): Payer: Commercial Managed Care - PPO

## 2022-10-16 DIAGNOSIS — J309 Allergic rhinitis, unspecified: Secondary | ICD-10-CM

## 2022-10-29 ENCOUNTER — Ambulatory Visit (INDEPENDENT_AMBULATORY_CARE_PROVIDER_SITE_OTHER): Payer: Commercial Managed Care - PPO

## 2022-10-29 DIAGNOSIS — J309 Allergic rhinitis, unspecified: Secondary | ICD-10-CM

## 2022-11-06 ENCOUNTER — Ambulatory Visit (INDEPENDENT_AMBULATORY_CARE_PROVIDER_SITE_OTHER): Payer: Commercial Managed Care - PPO

## 2022-11-06 DIAGNOSIS — J309 Allergic rhinitis, unspecified: Secondary | ICD-10-CM

## 2022-11-15 ENCOUNTER — Ambulatory Visit (INDEPENDENT_AMBULATORY_CARE_PROVIDER_SITE_OTHER): Payer: Commercial Managed Care - PPO

## 2022-11-15 DIAGNOSIS — J309 Allergic rhinitis, unspecified: Secondary | ICD-10-CM | POA: Diagnosis not present

## 2022-11-27 ENCOUNTER — Ambulatory Visit (INDEPENDENT_AMBULATORY_CARE_PROVIDER_SITE_OTHER): Payer: Commercial Managed Care - PPO

## 2022-11-27 DIAGNOSIS — J309 Allergic rhinitis, unspecified: Secondary | ICD-10-CM

## 2022-12-07 ENCOUNTER — Ambulatory Visit (INDEPENDENT_AMBULATORY_CARE_PROVIDER_SITE_OTHER): Payer: Commercial Managed Care - PPO

## 2022-12-07 DIAGNOSIS — J309 Allergic rhinitis, unspecified: Secondary | ICD-10-CM | POA: Diagnosis not present

## 2022-12-19 ENCOUNTER — Ambulatory Visit (INDEPENDENT_AMBULATORY_CARE_PROVIDER_SITE_OTHER): Payer: Commercial Managed Care - PPO

## 2022-12-19 DIAGNOSIS — J309 Allergic rhinitis, unspecified: Secondary | ICD-10-CM | POA: Diagnosis not present

## 2022-12-24 ENCOUNTER — Ambulatory Visit: Payer: Commercial Managed Care - PPO

## 2022-12-24 ENCOUNTER — Ambulatory Visit (INDEPENDENT_AMBULATORY_CARE_PROVIDER_SITE_OTHER): Payer: Commercial Managed Care - PPO | Admitting: Podiatry

## 2022-12-24 DIAGNOSIS — L603 Nail dystrophy: Secondary | ICD-10-CM | POA: Diagnosis not present

## 2022-12-24 DIAGNOSIS — M7742 Metatarsalgia, left foot: Secondary | ICD-10-CM | POA: Diagnosis not present

## 2022-12-24 DIAGNOSIS — M722 Plantar fascial fibromatosis: Secondary | ICD-10-CM

## 2022-12-24 DIAGNOSIS — M7750 Other enthesopathy of unspecified foot: Secondary | ICD-10-CM

## 2022-12-24 DIAGNOSIS — M7741 Metatarsalgia, right foot: Secondary | ICD-10-CM | POA: Diagnosis not present

## 2022-12-24 NOTE — Progress Notes (Signed)
  Subjective:  Patient ID: Emily [name], female    DOB: Dec 23, 1980,  MRN: [id number]  Chief Complaint  Patient presents with   Foot Pain    bil pain in the heels & the balls of the feet - constant discomfort mainly when walking - she has recently had bariatric surgery and has lost 60+ pounds so far - her feet have started hurting with exercise and she cannot stop exercising - she wants custom orthotics    42 y.o. female presents with the above complaint. History confirmed with patient.  She previously had his inserts before and these have been very helpful.  She also notes changes in the nail of the third and fourth toes on the left foot and right fifth which have worsened since surgery  Objective:  Physical Exam: warm, good capillary refill, no trophic changes or ulcerative lesions, normal DP and PT pulses, normal sensory exam, and tenderness along plantar fascia and plantar heel, she has thickened yellow dystrophic toenails on the left third and fourth and right fifth toes, diffuse callus and tenderness on the ball of foot nonspecific Assessment:   1. Plantar fasciitis, bilateral   2. Metatarsalgia of both feet   3. Nail dystrophy      Plan:  Patient was evaluated and treated and all questions answered.  Discussed the etiology and treatment options for plantar fasciitis including stretching, formal physical therapy, supportive shoegears such as a running shoe or sneaker, custom and pre fabricated orthoses, injection therapy, and oral medications.     -Educated patient on stretching and icing of the affected limb -Cannot tolerate NSAIDs with history of bariatric surgery -She has benefited from custom molded orthoses before, prefers a three-quarter length orthosis.  Recommended we fashioned this with a small metatarsal pad offloading heel cup.  She was casted for these today.  -Discussed the option of corticosteroid injection, she would prefer to hold off on this for now due to  significant pain from prior injections that were ineffective.   Regarding the nail dystrophy I suspect this is onychomycosis.  Nail sample of the nail plate was taken of the left third and fourth toes.  I will let her know the results of the show and we will consider treatment with topical medications.  No follow-ups on file.

## 2022-12-24 NOTE — Patient Instructions (Signed)

## 2022-12-28 DIAGNOSIS — E7800 Pure hypercholesterolemia, unspecified: Secondary | ICD-10-CM | POA: Insufficient documentation

## 2022-12-28 NOTE — Progress Notes (Unsigned)
LMP 06/01/2015 (Within Days)    Subjective:    Patient ID: Emily BeamEmily N [name], female    DOB: 1981/08/27, 42 y.o.   MRN: [id number]  HPI: Emily [name] is a 42 y.o. female presenting on 12/31/2022 for comprehensive medical examination. Current medical complaints include:none  She currently lives with: Menopausal Symptoms: no   Denies HA, CP, SOB, dizziness, palpitations, visual changes, and lower extremity swelling.  WEIGHT GAIN Duration: ongoing Previous attempts at weight loss:  medications and nutritionist   LUNG CHECK Patient states her breathing is getting better.  The allergy season has made it a little bit worse.  She now has a nebulizer which has helped a lot.   MOOD Patient states her mood is well controlled.  Denies concerns at visit today.  Her Wellbutrin is helping her.     Depression Screen done today and results listed below:     09/27/2022    2:53 PM 06/25/2022    9:24 AM 02/07/2022    4:10 PM 01/10/2022    1:20 PM 12/14/2021    8:08 AM  Depression screen PHQ 2/9  Decreased Interest 0 0 0 0 1  Down, Depressed, Hopeless 1 0 0 0 0  PHQ - 2 Score 1 0 0 0 1  Altered sleeping 1 2 2 1  0  Tired, decreased energy 0 1 1 1 1   Change in appetite 0 0 0 0 0  Feeling bad or failure about yourself  0 0 0 0 0  Trouble concentrating 0 0 0 0 0  Moving slowly or fidgety/restless 0 0 0 0 0  Suicidal thoughts 0 0 0 0 0  PHQ-9 Score 2 3 3 2 2   Difficult doing work/chores Not difficult at all Not difficult at all Not difficult at all Not difficult at all Not difficult at all    The patient does not have a history of falls. I did complete a risk assessment for falls. A plan of care for falls was documented.   Past Medical History:  Past Medical History:  Diagnosis Date  . Anemia   . Asthma    sports induced inhalers  . Bipolar 1 disorder (HCC)   . Conversion disorder   . History of kidney stones   . Insomnia   . Migraines    migraines  . MRSA infection 2010  . Ovarian  cyst    left 2.5cm  . Sleep apnea    has cpap  . Vitamin B 12 deficiency   . Vitamin D deficiency     Surgical History:  Past Surgical History:  Procedure Laterality Date  . BARIATRIC SURGERY  08/14/2022  . BUNIONECTOMY    . ESOPHAGOGASTRODUODENOSCOPY (EGD) WITH PROPOFOL N/A 11/03/2018   Procedure: ESOPHAGOGASTRODUODENOSCOPY (EGD) WITH PROPOFOL;  Surgeon: Wyline MoodAnna, Kiran, MD;  [facility];  Service: Gastroenterology;  Laterality: N/A;  . HAMMER TOE SURGERY    . HAND SURGERY  09/24/2012   cyst removed  . INTRAUTERINE DEVICE (IUD) INSERTION N/A 05/09/2015   Procedure: INTRAUTERINE DEVICE (IUD) INSERTION;  Surgeon: Hildred LaserAnika Cherry, MD;  Location: [facility] ORS;  Service: Gynecology;  Laterality: N/A;  . LAPAROSCOPIC ASSISTED VAGINAL HYSTERECTOMY N/A 06/20/2015   Procedure: LAPAROSCOPIC ASSISTED VAGINAL HYSTERECTOMY, BILATERAL SALPINGECTOMY, CYSTOSCOPY, IUD REMOVAL ;  Surgeon: Hildred LaserAnika Cherry, MD;  Location: [facility] ORS;  Service: Gynecology;  Laterality: N/A;  . LAPAROSCOPIC TUBAL LIGATION Bilateral 05/09/2015   Procedure: LAPAROSCOPIC TUBAL LIGATION;  Surgeon: Hildred LaserAnika Cherry, MD;  Location: [facility] ORS;  Service: Gynecology;  Laterality:  Bilateral;  . NASAL SINUS SURGERY    . TOTAL ABDOMINAL HYSTERECTOMY W/ BILATERAL SALPINGOOPHORECTOMY    . TUBAL LIGATION  09/24/2005  . UPPER GI ENDOSCOPY N/A 08/14/2022   Procedure: UPPER GI ENDOSCOPY;  Surgeon: Luretha MurphyMartin, Matthew, MD;  Location: [facility] ORS;  Service: General;  Laterality: N/A;    Medications:  Current Outpatient Medications on File Prior to Visit  Medication Sig  . albuterol (VENTOLIN HFA) 108 (90 Base) MCG/ACT inhaler Inhale 1-2 puffs into the lungs every 6 (six) hours as needed for wheezing or shortness of breath.  Marland Kitchen. buPROPion (WELLBUTRIN XL) 300 MG 24 hr tablet Take 1 tablet (300 mg total) by mouth daily.  . fluticasone (FLONASE) 50 MCG/ACT nasal spray Place 2 sprays into both nostrils daily as needed for allergies or rhinitis.  Marland Kitchen. ipratropium  (ATROVENT) 0.03 % nasal spray Place 1 spray into both nostrils 2 (two) times daily as needed for rhinitis.  . Multiple Vitamins-Minerals (ULTRA MULTI FORMULA/IRON) CAPS Take 3 capsules by mouth daily.  Marland Kitchen. nystatin cream (MYCOSTATIN) Apply 1 Application topically 2 (two) times daily.  Marland Kitchen. scopolamine (TRANSDERM-SCOP) 1 MG/3DAYS Place 1 patch (1.5 mg total) onto the skin every 3 (three) days. (Patient taking differently: Place 1 patch onto the skin every 3 (three) days. AS NEEDED)   No current facility-administered medications on file prior to visit.    Allergies:  Allergies  Allergen Reactions  . Codeine Diarrhea and Nausea And Vomiting    Social History:  Social History   Socioeconomic History  . Marital status: Married    Spouse name: Not on file  . Number of children: Not on file  . Years of education: Not on file  . Highest education level: Not on file  Occupational History  . Not on file  Tobacco Use  . Smoking status: Former    Packs/day: 1.00    Years: 10.00    Additional pack years: 0.00    Total pack years: 10.00    Types: Cigarettes    Quit date: 09/25/2003    Years since quitting: 19.2  . Smokeless tobacco: Never  Vaping Use  . Vaping Use: Never used  Substance and Sexual Activity  . Alcohol use: Not Currently    Alcohol/week: 0.0 - 1.0 standard drinks of alcohol  . Drug use: No  . Sexual activity: Yes    Birth control/protection: Surgical  Other Topics Concern  . Not on file  Social History Narrative  . Not on file   Social Determinants of Health   Financial Resource Strain: Not on file  Food Insecurity: No Food Insecurity (08/14/2022)   Hunger Vital Sign   . Worried About Programme researcher, broadcasting/film/videounning Out of Food in the Last Year: Never true   . Ran Out of Food in the Last Year: Never true  Transportation Needs: No Transportation Needs (08/14/2022)   PRAPARE - Transportation   . Lack of Transportation (Medical): No   . Lack of Transportation (Non-Medical): No  Physical  Activity: Not on file  Stress: Not on file  Social Connections: Not on file  Intimate Partner Violence: Not At Risk (08/14/2022)   Humiliation, Afraid, Rape, and Kick questionnaire   . Fear of Current or Ex-Partner: No   . Emotionally Abused: No   . Physically Abused: No   . Sexually Abused: No   Social History   Tobacco Use  Smoking Status Former  . Packs/day: 1.00  . Years: 10.00  . Additional pack years: 0.00  . Total pack years: 10.00  .  Types: Cigarettes  . Quit date: 09/25/2003  . Years since quitting: 19.2  Smokeless Tobacco Never   Social History   Substance and Sexual Activity  Alcohol Use Not Currently  . Alcohol/week: 0.0 - 1.0 standard drinks of alcohol    Family History:  Family History  Problem Relation Age of Onset  . Bipolar disorder Mother   . Anxiety disorder Mother   . Cancer Mother        brain  . Glaucoma Mother   . Heart disease Father   . Diabetes Father   . Cancer Father   . Bipolar disorder Sister   . Schizophrenia Sister   . Breast cancer Brother   . Breast cancer Maternal Aunt   . Cancer Maternal Grandmother        lung  . Heart disease Maternal Grandfather   . Ovarian cancer Neg Hx     Past medical history, surgical history, medications, allergies, family history and social history reviewed with patient today and changes made to appropriate areas of the chart.   Review of Systems  Eyes:  Negative for blurred vision and double vision.  Respiratory:  Negative for shortness of breath.   Cardiovascular:  Negative for chest pain, palpitations and leg swelling.  Neurological:  Negative for dizziness and headaches.  Psychiatric/Behavioral:  Negative for depression and suicidal ideas. The patient is not nervous/anxious.   All other ROS negative except what is listed above and in the HPI.      Objective:    LMP 06/01/2015 (Within Days)   Wt Readings from Last 3 Encounters:  10/11/22 234 lb 8 oz (106.4 kg)  10/09/22 230 lb 12.8 oz  (104.7 kg)  10/04/22 232 lb 11.2 oz (105.6 kg)    Physical Exam Vitals and nursing note reviewed.  Constitutional:      General: She is awake. She is not in acute distress.    Appearance: She is well-developed. She is obese. She is not ill-appearing.  HENT:     Head: Normocephalic and atraumatic.     Right Ear: Hearing, tympanic membrane, ear canal and external ear normal. No drainage.     Left Ear: Hearing, tympanic membrane, ear canal and external ear normal. No drainage.     Nose: Nose normal.     Right Sinus: No maxillary sinus tenderness or frontal sinus tenderness.     Left Sinus: No maxillary sinus tenderness or frontal sinus tenderness.     Mouth/Throat:     Mouth: Mucous membranes are moist.     Pharynx: Oropharynx is clear. Uvula midline. No pharyngeal swelling, oropharyngeal exudate or posterior oropharyngeal erythema.  Eyes:     General: Lids are normal.        Right eye: No discharge.        Left eye: No discharge.     Extraocular Movements: Extraocular movements intact.     Conjunctiva/sclera: Conjunctivae normal.     Pupils: Pupils are equal, round, and reactive to light.     Visual Fields: Right eye visual fields normal and left eye visual fields normal.  Neck:     Thyroid: No thyromegaly.     Vascular: No carotid bruit.     Trachea: Trachea normal.  Cardiovascular:     Rate and Rhythm: Normal rate and regular rhythm.     Heart sounds: Normal heart sounds. No murmur heard.    No gallop.  Pulmonary:     Effort: Pulmonary effort is normal. No accessory muscle usage or  respiratory distress.     Breath sounds: Normal breath sounds.  Chest:  Breasts:    Right: Normal.     Left: Normal.  Abdominal:     General: Bowel sounds are normal.     Palpations: Abdomen is soft. There is no hepatomegaly or splenomegaly.     Tenderness: There is no abdominal tenderness.  Musculoskeletal:        General: Normal range of motion.     Cervical back: Normal range of motion and  neck supple.     Right lower leg: No edema.     Left lower leg: No edema.  Lymphadenopathy:     Head:     Right side of head: No submental, submandibular, tonsillar, preauricular or posterior auricular adenopathy.     Left side of head: No submental, submandibular, tonsillar, preauricular or posterior auricular adenopathy.     Cervical: No cervical adenopathy.     Upper Body:     Right upper body: No supraclavicular, axillary or pectoral adenopathy.     Left upper body: No supraclavicular, axillary or pectoral adenopathy.  Skin:    General: Skin is warm and dry.     Capillary Refill: Capillary refill takes less than 2 seconds.     Findings: No rash.  Neurological:     Mental Status: She is alert and oriented to person, place, and time.     Gait: Gait is intact.     Deep Tendon Reflexes: Reflexes are normal and symmetric.     Reflex Scores:      Brachioradialis reflexes are 2+ on the right side and 2+ on the left side.      Patellar reflexes are 2+ on the right side and 2+ on the left side. Psychiatric:        Attention and Perception: Attention normal.        Mood and Affect: Mood normal.        Speech: Speech normal.        Behavior: Behavior normal. Behavior is cooperative.        Thought Content: Thought content normal.        Judgment: Judgment normal.    Results for orders placed or performed in visit on 10/04/22  Food Allergy Profile  Result Value Ref Range   Class Description Allergens Comment    Egg White IgE <0.10 Class 0 kU/L   Peanut IgE <0.10 Class 0 kU/L   Soybean IgE <0.10 Class 0 kU/L   Milk IgE CANCELED kU/L   Clam IgE <0.10 Class 0 kU/L   Shrimp IgE <0.10 Class 0 kU/L   Walnut IgE <0.10 Class 0 kU/L   Codfish IgE <0.10 Class 0 kU/L   Scallop IgE <0.10 Class 0 kU/L   Wheat IgE <0.10 Class 0 kU/L   Allergen Corn, IgE <0.10 Class 0 kU/L   Sesame Seed IgE <0.10 Class 0 kU/L  Allergen Profile, Food-Meat  Result Value Ref Range   Pork IgE 0.37 (A) Class I  kU/L   Beef IgE 0.80 (A) Class II kU/L   Chicken IgE <0.10 Class 0 kU/L  Allergen, Mutton, f88  Result Value Ref Range   Allergen Lamb IgE 0.44 (A) Class I kU/L  Allergen, Casein, f78  Result Value Ref Range   F078-IgE Casein <0.10 Class 0 kU/L  IgE Milk w/ Component Reflex  Result Value Ref Range   F002-IgE Milk 0.12 (A) Class 0/I kU/L      Assessment & Plan:   Problem  List Items Addressed This Visit      Cardiovascular and Mediastinum   Pulmonary artery hypertension - Primary     Respiratory   Asthma     Other   Bipolar disorder   Vitamin B12 deficiency   Vitamin D deficiency   Obesity     Follow up plan: No follow-ups on file.   LABORATORY TESTING:  - Pap smear: done elsewhere  IMMUNIZATIONS:   - Tdap: Tetanus vaccination status reviewed: last tetanus booster within 10 years. - Influenza: Up to date - Pneumovax: NA - Prevnar: NA - COVID: Up to date - HPV: NA - Shingrix vaccine: NA  SCREENING: -Mammogram: Up to date - Colonoscopy: NA - Bone Density: NA -Hearing Test: NA -Spirometry: NA  PATIENT COUNSELING:   Advised to take 1 mg of folate supplement per day if capable of pregnancy.   Sexuality: Discussed sexually transmitted diseases, partner selection, use of condoms, avoidance of unintended pregnancy  and contraceptive alternatives.   Advised to avoid cigarette smoking.  I discussed with the patient that most people either abstain from alcohol or drink within safe limits (<=14/week and <=4 drinks/occasion for males, <=7/weeks and <= 3 drinks/occasion for females) and that the risk for alcohol disorders and other health effects rises proportionally with the number of drinks per week and how often a drinker exceeds daily limits.  Discussed cessation/primary prevention of drug use and availability of treatment for abuse.   Diet: Encouraged to adjust caloric intake to maintain  or achieve ideal body weight, to reduce intake of dietary saturated fat and  total fat, to limit sodium intake by avoiding high sodium foods and not adding table salt, and to maintain adequate dietary potassium and calcium preferably from fresh fruits, vegetables, and low-fat dairy products.    stressed the importance of regular exercise  Injury prevention: Discussed safety belts, safety helmets, smoke detector, smoking near bedding or upholstery.   Dental health: Discussed importance of regular tooth brushing, flossing, and dental visits.    NEXT PREVENTATIVE PHYSICAL DUE IN 1 YEAR. No follow-ups on file.

## 2022-12-31 ENCOUNTER — Ambulatory Visit (INDEPENDENT_AMBULATORY_CARE_PROVIDER_SITE_OTHER): Payer: Commercial Managed Care - PPO | Admitting: *Deleted

## 2022-12-31 ENCOUNTER — Encounter: Payer: Self-pay | Admitting: Nurse Practitioner

## 2022-12-31 ENCOUNTER — Ambulatory Visit (INDEPENDENT_AMBULATORY_CARE_PROVIDER_SITE_OTHER): Payer: Commercial Managed Care - PPO | Admitting: Nurse Practitioner

## 2022-12-31 VITALS — BP 92/63 | HR 80 | Temp 97.7°F | Ht 67.7 in | Wt 203.1 lb

## 2022-12-31 DIAGNOSIS — E538 Deficiency of other specified B group vitamins: Secondary | ICD-10-CM

## 2022-12-31 DIAGNOSIS — E559 Vitamin D deficiency, unspecified: Secondary | ICD-10-CM | POA: Diagnosis not present

## 2022-12-31 DIAGNOSIS — Z0000 Encounter for general adult medical examination without abnormal findings: Secondary | ICD-10-CM

## 2022-12-31 DIAGNOSIS — G43909 Migraine, unspecified, not intractable, without status migrainosus: Secondary | ICD-10-CM

## 2022-12-31 DIAGNOSIS — E6609 Other obesity due to excess calories: Secondary | ICD-10-CM | POA: Diagnosis not present

## 2022-12-31 DIAGNOSIS — E7800 Pure hypercholesterolemia, unspecified: Secondary | ICD-10-CM

## 2022-12-31 DIAGNOSIS — J4520 Mild intermittent asthma, uncomplicated: Secondary | ICD-10-CM

## 2022-12-31 DIAGNOSIS — I2721 Secondary pulmonary arterial hypertension: Secondary | ICD-10-CM | POA: Diagnosis not present

## 2022-12-31 DIAGNOSIS — J309 Allergic rhinitis, unspecified: Secondary | ICD-10-CM | POA: Diagnosis not present

## 2022-12-31 DIAGNOSIS — F314 Bipolar disorder, current episode depressed, severe, without psychotic features: Secondary | ICD-10-CM

## 2022-12-31 DIAGNOSIS — Z6839 Body mass index (BMI) 39.0-39.9, adult: Secondary | ICD-10-CM

## 2022-12-31 DIAGNOSIS — J849 Interstitial pulmonary disease, unspecified: Secondary | ICD-10-CM

## 2022-12-31 DIAGNOSIS — F3170 Bipolar disorder, currently in remission, most recent episode unspecified: Secondary | ICD-10-CM

## 2022-12-31 LAB — URINALYSIS, ROUTINE W REFLEX MICROSCOPIC
Glucose, UA: NEGATIVE
Leukocytes,UA: NEGATIVE
Nitrite, UA: NEGATIVE
Protein,UA: NEGATIVE
Specific Gravity, UA: >1.03 — ABNORMAL HIGH (ref 1.005–1.030)
Urobilinogen, Ur: 0.2 mg/dL (ref 0.2–1.0)
pH, UA: 5.5 (ref 5.0–7.5)

## 2022-12-31 LAB — MICROSCOPIC EXAMINATION: Bacteria, UA: NONE SEEN

## 2022-12-31 MED ORDER — BUPROPION HCL ER (XL) 300 MG PO TB24: 300.0000 mg | ORAL_TABLET | Freq: Every day | ORAL | 1 refills | Status: DC

## 2022-12-31 MED ORDER — SUMATRIPTAN SUCCINATE 25 MG PO TABS: 25.0000 mg | ORAL_TABLET | ORAL | 1 refills | Status: AC | PRN

## 2022-12-31 NOTE — Assessment & Plan Note (Signed)
Labs ordered at visit today.  Will make recommendations based on lab results.   

## 2022-12-31 NOTE — Assessment & Plan Note (Signed)
Chronic. Followed by Pulmonology.  Continue to follow per their recommendations. 

## 2022-12-31 NOTE — Assessment & Plan Note (Signed)
Chronic. Not well controlled. Patient not able to afford Nutrec. Will change to Imitrex.  Discussed side effects and benefits of medication during visit.  Dicussed how to take medication.  Follow up if not improved.

## 2022-12-31 NOTE — Assessment & Plan Note (Signed)
Chronic.  Controlled.  Continue with current medication regimen of Wellbutrin.  Refills sent today.  Labs ordered today.  Return to clinic in 6 months for reevaluation.  Call sooner if concerns arise.   

## 2022-12-31 NOTE — Assessment & Plan Note (Signed)
Recommended eating smaller high protein, low fat meals more frequently and exercising 30 mins a day 5 times a week with a goal of 10-15lb weight loss in the next 3 months.  Patient had bariatric surgery in November 2023.

## 2023-01-01 LAB — COMPREHENSIVE METABOLIC PANEL WITH GFR
ALT: 9 [IU]/L (ref 0–32)
AST: 17 [IU]/L (ref 0–40)
Albumin/Globulin Ratio: 1.7 (ref 1.2–2.2)
Albumin: 4.2 g/dL (ref 3.9–4.9)
Alkaline Phosphatase: 104 [IU]/L (ref 44–121)
BUN/Creatinine Ratio: 12 (ref 9–23)
BUN: 11 mg/dL (ref 6–24)
Bilirubin Total: 0.8 mg/dL (ref 0.0–1.2)
CO2: 23 mmol/L (ref 20–29)
Calcium: 9.8 mg/dL (ref 8.7–10.2)
Chloride: 99 mmol/L (ref 96–106)
Creatinine, Ser: 0.9 mg/dL (ref 0.57–1.00)
Globulin, Total: 2.5 g/dL (ref 1.5–4.5)
Glucose: 65 mg/dL — ABNORMAL LOW (ref 70–99)
Potassium: 4.8 mmol/L (ref 3.5–5.2)
Sodium: 141 mmol/L (ref 134–144)
Total Protein: 6.7 g/dL (ref 6.0–8.5)
eGFR: 82 mL/min/{1.73_m2}

## 2023-01-01 LAB — CBC WITH DIFFERENTIAL/PLATELET
Basophils Absolute: 0 10*3/uL (ref 0.0–0.2)
Basos: 0 %
EOS (ABSOLUTE): 0.1 10*3/uL (ref 0.0–0.4)
Eos: 1 %
Hematocrit: 43.4 % (ref 34.0–46.6)
Hemoglobin: 14.2 g/dL (ref 11.1–15.9)
Immature Grans (Abs): 0 10*3/uL (ref 0.0–0.1)
Immature Granulocytes: 0 %
Lymphocytes Absolute: 2.3 10*3/uL (ref 0.7–3.1)
Lymphs: 44 %
MCH: 30.5 pg (ref 26.6–33.0)
MCHC: 32.7 g/dL (ref 31.5–35.7)
MCV: 93 fL (ref 79–97)
Monocytes Absolute: 0.6 10*3/uL (ref 0.1–0.9)
Monocytes: 12 %
Neutrophils Absolute: 2.3 10*3/uL (ref 1.4–7.0)
Neutrophils: 43 %
Platelets: 200 10*3/uL (ref 150–450)
RBC: 4.65 x10E6/uL (ref 3.77–5.28)
RDW: 13.3 % (ref 11.7–15.4)
WBC: 5.3 10*3/uL (ref 3.4–10.8)

## 2023-01-01 LAB — VITAMIN B12: Vitamin B-12: 1038 pg/mL (ref 232–1245)

## 2023-01-01 LAB — LIPID PANEL
Chol/HDL Ratio: 4.6 ratio — ABNORMAL HIGH (ref 0.0–4.4)
Cholesterol, Total: 188 mg/dL (ref 100–199)
HDL: 41 mg/dL (ref >39)
LDL Chol Calc (NIH): 122 mg/dL — ABNORMAL HIGH (ref 0–99)
Triglycerides: 141 mg/dL (ref 0–149)
VLDL Cholesterol Cal: 25 mg/dL (ref 5–40)

## 2023-01-01 LAB — TSH: TSH: 3.41 u[IU]/mL (ref 0.450–4.500)

## 2023-01-01 LAB — VITAMIN D 25 HYDROXY (VIT D DEFICIENCY, FRACTURES): Vit D, 25-Hydroxy: 80.2 ng/mL (ref 30.0–100.0)

## 2023-01-01 NOTE — Progress Notes (Signed)
Hi Mirakle. It was nice to see you yesterday.  Your lab work looks good.  No concerns at this time. Continue with your current medication regimen.  Follow up as discussed.  Please let me know if you have any questions.

## 2023-01-02 NOTE — Patient Instructions (Incomplete)
Asthma Continue albuterol once every 4 hours as needed for cough or wheeze You may use albuterol 2 puffs 5-15 minutes before activity to decrease cough or wheeze For asthma flare, begin Pulmicort 0.5 mg via nebulizer twice a day to prevent cough or wheeze. You may stop in 2 weeks or until cough and wheeze free.   Allergic rhinitis Continue allergen avoidance measures directed toward pollen, mold, pet, and dust mite as listed below Continue Flonase nasal spray 2 sprays in each nostril once a day as needed for stuffy nose Continue ipratropium nasal spray 2 sprays in each nostril twice a day as needed for nasal symptoms Consider saline nasal rinses as needed for nasal symptoms. Use this before any medicated nasal sprays for best result Continue allergen immunotherapy per protocol and have access to an epinephrine autoinjector set  Pruritus You can begin an antihistamine once a day as needed for itch. Some examples of over the counter antihistamines include Zyrtec (cetirizine), Xyzal (levocetirizine), Allegra (fexofenadine), and Claritin (loratidine).   Atopic dermatitis Continue a daily moisturizer to red or itchy areas that are bothering you  Food allergy Continue to avoid fish, shellfish, lamb, beef, pork, dairy products, artificial sweeteners, and agave.  In case of an allergic reaction, take Benadryl 50 mg every 4 hours, and if life-threatening symptoms occur, inject with EpiPen 0.3 mg. If your symptoms re-occur, begin a journal of events that occurred for up to 6 hours before your symptoms began including symptoms you experienced,foods and beverages consumed, and medications taken. Consider in office food challenges to some of these foods.  Call the clinic if you are interested in setting up an appointment for a food challenge  Call the clinic if this treatment plan is not working well for you.  Follow up in 3 months or sooner if needed.  Reducing Pollen Exposure The American Academy of  Allergy, Asthma and Immunology suggests the following steps to reduce your exposure to pollen during allergy seasons. Do not hang sheets or clothing out to dry; pollen may collect on these items. Do not mow lawns or spend time around freshly cut grass; mowing stirs up pollen. Keep windows closed at night.  Keep car windows closed while driving. Minimize morning activities outdoors, a time when pollen counts are usually at their highest. Stay indoors as much as possible when pollen counts or humidity is high and on windy days when pollen tends to remain in the air longer. Use air conditioning when possible.  Many air conditioners have filters that trap the pollen spores. Use a HEPA room air filter to remove pollen form the indoor air you breathe.  Control of Dog or Cat Allergen Avoidance is the best way to manage a dog or cat allergy. If you have a dog or cat and are allergic to dog or cats, consider removing the dog or cat from the home. If you have a dog or cat but don't want to find it a new home, or if your family wants a pet even though someone in the household is allergic, here are some strategies that may help keep symptoms at bay:  Keep the pet out of your bedroom and restrict it to only a few rooms. Be advised that keeping the dog or cat in only one room will not limit the allergens to that room. Don't pet, hug or kiss the dog or cat; if you do, wash your hands with soap and water. High-efficiency particulate air (HEPA) cleaners run continuously in a bedroom  or living room can reduce allergen levels over time. Regular use of a high-efficiency vacuum cleaner or a central vacuum can reduce allergen levels. Giving your dog or cat a bath at least once a week can reduce airborne allergen.   Control of Dust Mite Allergen Dust mites play a major role in allergic asthma and rhinitis. They occur in environments with high humidity wherever human skin is found. Dust mites absorb humidity from the  atmosphere (ie, they do not drink) and feed on organic matter (including shed human and animal skin). Dust mites are a microscopic type of insect that you cannot see with the naked eye. High levels of dust mites have been detected from mattresses, pillows, carpets, upholstered furniture, bed covers, clothes, soft toys and any woven material. The principal allergen of the dust mite is found in its feces. A gram of dust may contain 1,000 mites and 250,000 fecal particles. Mite antigen is easily measured in the air during house cleaning activities. Dust mites do not bite and do not cause harm to humans, other than by triggering allergies/asthma.  Ways to decrease your exposure to dust mites in your home:  1. Encase mattresses, box springs and pillows with a mite-impermeable barrier or cover  2. Wash sheets, blankets and drapes weekly in hot water (130 F) with detergent and dry them in a dryer on the hot setting.  3. Have the room cleaned frequently with a vacuum cleaner and a damp dust-mop. For carpeting or rugs, vacuuming with a vacuum cleaner equipped with a high-efficiency particulate air (HEPA) filter. The dust mite allergic individual should not be in a room which is being cleaned and should wait 1 hour after cleaning before going into the room.  4. Do not sleep on upholstered furniture (eg, couches).  5. If possible removing carpeting, upholstered furniture and drapery from the home is ideal. Horizontal blinds should be eliminated in the rooms where the person spends the most time (bedroom, study, television room). Washable vinyl, roller-type shades are optimal.  6. Remove all non-washable stuffed toys from the bedroom. Wash stuffed toys weekly like sheets and blankets above.  7. Reduce indoor humidity to less than 50%. Inexpensive humidity monitors can be purchased at most hardware stores. Do not use a humidifier as can make the problem worse and are not recommended.  Control of Mold  Allergen Mold and fungi can grow on a variety of surfaces provided certain temperature and moisture conditions exist.  Outdoor molds grow on plants, decaying vegetation and soil.  The major outdoor mold, Alternaria and Cladosporium, are found in very high numbers during hot and dry conditions.  Generally, a late Summer - Fall peak is seen for common outdoor fungal spores.  Rain will temporarily lower outdoor mold spore count, but counts rise rapidly when the rainy period ends.  The most important indoor molds are Aspergillus and Penicillium.  Dark, humid and poorly ventilated basements are ideal sites for mold growth.  The next most common sites of mold growth are the bathroom and the kitchen.  Outdoor [employer] Use air conditioning and keep windows closed Avoid exposure to decaying vegetation. Avoid leaf raking. Avoid grain handling. Consider wearing a face mask if working in moldy areas.  Indoor Mold Control Maintain humidity below 50%. Clean washable surfaces with 5% bleach solution. Remove sources e.g. Contaminated carpets.

## 2023-01-02 NOTE — Progress Notes (Signed)
[address] [place] [address] Dept: [phone]  FOLLOW UP NOTE  Patient ID: Emily [name], female    DOB: [birth year]  Age: 42 y.o. MRN: [id number] Date of Office Visit: 01/03/2023  Assessment  Chief Complaint: Allergic Reaction (Not sure what foods - GI upset, feeling sick but not vomiting )  HPI Emily [name] is a 42 year old female who presents to the clinic for follow-up visit.  She was last seen in this clinic on 10/04/2022 by Thermon Leyland, FNP, for evaluation of asthma, allergic rhinitis, pruritus, atopic dermatitis, food allergy to lamb, fish, shellfish, agave, and artificial sweeteners as well as oral pollen allergy.    At today's visit, she reports her asthma has been well-controlled with no shortness of breath, cough, or wheeze with activity or rest.  She continues albuterol before activity and occasionally uses albuterol after activity to ensure that she will not experience shortness of breath.    Allergic rhinitis is reported as moderately well-controlled with symptoms including clear rhinorrhea, nasal congestion, sneezing, and postnasal drainage.  She is not currently taking an antihistamine as she fears this will cause her to gain weight.  She is occasionally using the Flonase nasal spray and is not using ipratropium or nasal saline rinses.  She continues allergen immunotherapy with occasional redness at the injection site.  She began allergen immunotherapy directed toward pets, dust mite, grass pollen, and mold on 04/30/2022.  She reports a significant decrease in her symptoms of allergic rhinitis while continuing on allergen immunotherapy.  Atopic dermatitis is reported as well-controlled with a twice a day moisturizing routine.  She reports dry skin is the worst in the spring and fall.  She continues to avoid beef, pork, lamb, fish, milk, agape, and artificial sweeteners.  She reports abdominal pain and nausea after eating some foods.  She reports inconsistent symptoms of  abdominal pain and nausea, for example, abdominal pain and nausea after eating chicken on one day and no abdominal pain or nausea after eating chicken the next day.  Her last food allergy skin prick testing was on 04/09/2022 and was negative to the selected foods tested.  Lab testing from 10/04/2022 indicates milk IgE 0.12, pork IgE 0.37, beef  IgE 0.80, and lamb IgE 0.44.  Negative lab testing from 10/04/2002 includes egg, peanut, soy, clam, shrimp, walnut, codfish, scallop, wheat, corn, sesame seed, and chicken.   She continues to follow-up with Dr. Daphine Deutscher at [facility] surgery for laparoscopic sleeve gastrectomy.  Her current medications are listed in the chart.   Drug Allergies:  Allergies  Allergen Reactions   Codeine Diarrhea and Nausea And Vomiting    Physical Exam: BP 118/76   Pulse 76   Temp 98.7 F (37.1 C)   Resp 18   Ht 5\' 8"  (1.727 m)   Wt 202 lb 3.2 oz (91.7 kg)   LMP 06/01/2015 (Within Days)   SpO2 99%   BMI 30.74 kg/m    Physical Exam Vitals reviewed.  Constitutional:      Appearance: Normal appearance.  HENT:     Head: Normocephalic and atraumatic.     Right Ear: Tympanic membrane normal.     Left Ear: Tympanic membrane normal.     Nose:     Comments: Bilateral nares slightly erythematous with clear nasal drainage noted.  Pharynx slightly erythematous with no exudate.  Ears normal.  Eyes normal. Eyes:     Conjunctiva/sclera: Conjunctivae normal.  Cardiovascular:     Rate and Rhythm: Normal  rate and regular rhythm.     Heart sounds: Normal heart sounds. No murmur heard. Pulmonary:     Effort: Pulmonary effort is normal.     Breath sounds: Normal breath sounds.     Comments: Lungs clear to auscultation Musculoskeletal:        General: Normal range of motion.     Cervical back: Normal range of motion and neck supple.  Skin:    General: Skin is warm and dry.  Neurological:     Mental Status: She is alert and oriented to person, place, and time.   Psychiatric:        Mood and Affect: Mood normal.        Behavior: Behavior normal.        Thought Content: Thought content normal.        Judgment: Judgment normal.     Diagnostics: FVC 3.73 which is 89% of predicted value, FEV1 3.06 which is 91% of predicted value.  Spirometry indicates normal ventilatory function.  He  Assessment and Plan: 1. Mild intermittent asthma without complication   2. Seasonal and perennial allergic rhinitis   3. Pruritus   4. Adverse food reaction, initial encounter   5. Atopic dermatitis, unspecified type     Meds ordered this encounter  Medications   albuterol (VENTOLIN HFA) 108 (90 Base) MCG/ACT inhaler    Sig: Inhale 1-2 puffs into the lungs every 6 (six) hours as needed for wheezing or shortness of breath.    Dispense:  18 g    Refill:  1    Please dispense insurance covered brand   EPINEPHrine 0.3 mg/0.3 mL IJ SOAJ injection    Sig: Inject 0.3 mg into the muscle as needed for anaphylaxis.    Dispense:  2 each    Refill:  2    Please dispense insurance covered brand   budesonide (PULMICORT) 0.5 MG/2ML nebulizer solution    Sig: Take 2 mLs (0.5 mg total) by nebulization daily.    Dispense:  180 mL    Refill:  1    Patient Instructions  Asthma Continue albuterol once every 4 hours as needed for cough or wheeze You may use albuterol 2 puffs 5-15 minutes before activity to decrease cough or wheeze For asthma flare, begin Pulmicort 0.5 mg via nebulizer twice a day to prevent cough or wheeze. You may stop in 2 weeks or until cough and wheeze free.   Allergic rhinitis Continue allergen avoidance measures directed toward pollen, mold, pet, and dust mite as listed below Continue Flonase nasal spray 2 sprays in each nostril once a day as needed for stuffy nose Continue ipratropium nasal spray 2 sprays in each nostril twice a day as needed for nasal symptoms Consider saline nasal rinses as needed for nasal symptoms. Use this before any medicated  nasal sprays for best result Continue allergen immunotherapy per protocol and have access to an epinephrine autoinjector set  Pruritus You can begin an antihistamine once a day as needed for itch. Some examples of over the counter antihistamines include Zyrtec (cetirizine), Xyzal (levocetirizine), Allegra (fexofenadine), and Claritin (loratidine).   Atopic dermatitis Continue a daily moisturizer to red or itchy areas that are bothering you  Food allergy Continue to avoid fish, shellfish, lamb, beef, pork, dairy products, artificial sweeteners, and agave.  In case of an allergic reaction, take Benadryl 50 mg every 4 hours, and if life-threatening symptoms occur, inject with EpiPen 0.3 mg. If your symptoms re-occur, begin a journal of events  that occurred for up to 6 hours before your symptoms began including symptoms you experienced,foods and beverages consumed, and medications taken.  Call the clinic if this treatment plan is not working well for you.  Follow up in 3 months or sooner if needed.   Return in about 3 months (around 04/04/2023), or if symptoms worsen or fail to improve.    Thank you for the opportunity to care for this patient.  Please do not hesitate to contact me with questions.  Thermon Leyland, FNP [facility]

## 2023-01-03 ENCOUNTER — Other Ambulatory Visit: Payer: Self-pay

## 2023-01-03 ENCOUNTER — Encounter: Payer: Self-pay | Admitting: Family Medicine

## 2023-01-03 ENCOUNTER — Ambulatory Visit (INDEPENDENT_AMBULATORY_CARE_PROVIDER_SITE_OTHER): Payer: Commercial Managed Care - PPO | Admitting: Family Medicine

## 2023-01-03 VITALS — BP 118/76 | HR 76 | Temp 98.7°F | Resp 18 | Ht 68.0 in | Wt 202.2 lb

## 2023-01-03 DIAGNOSIS — L299 Pruritus, unspecified: Secondary | ICD-10-CM | POA: Diagnosis not present

## 2023-01-03 DIAGNOSIS — J302 Other seasonal allergic rhinitis: Secondary | ICD-10-CM

## 2023-01-03 DIAGNOSIS — J4520 Mild intermittent asthma, uncomplicated: Secondary | ICD-10-CM

## 2023-01-03 DIAGNOSIS — T7819XA Other adverse food reactions, not elsewhere classified, initial encounter: Secondary | ICD-10-CM

## 2023-01-03 DIAGNOSIS — T781XXA Other adverse food reactions, not elsewhere classified, initial encounter: Secondary | ICD-10-CM | POA: Diagnosis not present

## 2023-01-03 DIAGNOSIS — L209 Atopic dermatitis, unspecified: Secondary | ICD-10-CM

## 2023-01-03 DIAGNOSIS — J3089 Other allergic rhinitis: Secondary | ICD-10-CM | POA: Diagnosis not present

## 2023-01-03 MED ORDER — EPINEPHRINE 0.3 MG/0.3ML IJ SOAJ: 0.3000 mg | INTRAMUSCULAR | 2 refills | Status: DC | PRN

## 2023-01-03 MED ORDER — ALBUTEROL SULFATE HFA 108 (90 BASE) MCG/ACT IN AERS: 1.0000 | INHALATION_SPRAY | Freq: Four times a day (QID) | RESPIRATORY_TRACT | 1 refills | Status: DC | PRN

## 2023-01-03 MED ORDER — BUDESONIDE 0.5 MG/2ML IN SUSP: 0.5000 mg | Freq: Every day | RESPIRATORY_TRACT | 1 refills | Status: DC

## 2023-01-09 ENCOUNTER — Ambulatory Visit: Payer: Commercial Managed Care - PPO | Admitting: Skilled Nursing Facility1

## 2023-01-11 ENCOUNTER — Ambulatory Visit (INDEPENDENT_AMBULATORY_CARE_PROVIDER_SITE_OTHER): Payer: Commercial Managed Care - PPO

## 2023-01-11 DIAGNOSIS — J309 Allergic rhinitis, unspecified: Secondary | ICD-10-CM

## 2023-01-13 ENCOUNTER — Encounter: Payer: Self-pay | Admitting: Podiatry

## 2023-01-13 ENCOUNTER — Encounter: Payer: Self-pay | Admitting: Family Medicine

## 2023-01-14 ENCOUNTER — Encounter: Payer: Self-pay | Admitting: Skilled Nursing Facility1

## 2023-01-14 ENCOUNTER — Encounter: Payer: Commercial Managed Care - PPO | Attending: Surgery | Admitting: Skilled Nursing Facility1

## 2023-01-14 NOTE — Telephone Encounter (Signed)
This patient can not tolerate inhaled steroid in any form and I would like to get her on a biologic therapy for asthma. She had IgE 32 and a perennial allergen which qualifies her for Xolair. She has not had any EOS. Do you think she can get Tezi without trying and failing Xolair? Would love [facility] as a first choice but would take Xolair if denied for [facility]. Thank you!!!

## 2023-01-14 NOTE — Progress Notes (Signed)
Bariatric Nutrition Follow-Up Visit Medical Nutrition Therapy  Surgery date: 08/14/2022 Surgery type: Sleeve Gastrectomy  Anthropometrics  Start weight at [facility]: 252 lbs (date: 09/11/2021)  Height: 68 in Weight today: 198.1 pounds   Clinical  Medical hx: sleep apnea, bipolar, IBS Medications: see list  Labs:  Notable signs/symptoms: constipation  Any previous deficiencies? 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  Pt states she logs her food and beverages stating logging helps her.  Pt states she is very excited to be able to run again.   Estimated daily fluid intake: 70-90 oz Estimated daily protein intake: 80 g Supplements: multivitamin and states she cannot do the calcium because it tares her stomach up  Current average weekly physical activity: BELT not consistently; walking outside 1.1 miles a couple times   24-Hr Dietary Recall First Meal: chicken fajita with egg with veggies  Snack: [place] pepperoni Second Meal:  chicken with steamed vegetables  Snack: protein puffs Third Meal: chicken with steamed vegetables  Snack:  Beverages: water, Gatorade zero   Post-Op Goals/ Signs/ Symptoms Using straws: no Drinking while eating: no Chewing/swallowing difficulties: no Changes in vision: no Changes to mood/headaches: no Hair  loss/changes to skin/nails: some Difficulty focusing/concentrating: no Sweating: no Limb weakness: no Dizziness/lightheadedness: no Palpitations: no Carbonated/caffeinated beverages: no N/V/D/C/Gas: yes, from newly diagnosed allergies Abdominal pain: no Dumping syndrome: no    NUTRITION DIAGNOSIS  Overweight/obesity ([place]-3.3) related to past poor dietary habits and physical inactivity as evidenced by completed bariatric surgery and following dietary guidelines for continued weight loss and healthy nutrition status.     NUTRITION INTERVENTION Nutrition counseling (C-1) and education (E-2) to facilitate bariatric surgery goals, including: Diet advancement to the next phase (phase 4) now including fruits an vegetables. The importance of consuming adequate calories as well as certain nutrients daily due to the body's need for essential vitamins, minerals, and fats The importance of daily physical activity and to reach a goal of at least 150 minutes of moderate to vigorous physical activity weekly (or as directed by their physician) due to benefits such as increased musculature and improved lab values The importance of intuitive eating specifically learning hunger-satiety cues and understanding the importance of learning a new body: The importance of mindful eating to avoid grazing behaviors  Educated pt on the importance of incorporating whole fruits and vegetables into diet as pt's diet phase advances.  Creation of balanced and diverse meals to increase the intake of nutrient-rich foods that provide essential  vitamins, minerals, fiber, and phytonutrients Variety of Fruits and Vegetables: Aim for a colorful array of fruits and vegetables to ensure a wide range of nutrients. Include a mix of leafy greens, berries, citrus fruits, cruciferous vegetables, and more. Whole Grains: Choose whole grains over refined grains. Examples include brown rice, quinoa, oats, whole wheat, and barley. Lean  Proteins: Include lean sources of protein, such as poultry, fish, tofu, legumes, beans, lentils, and low-fat dairy products. Limit red and processed meats. Healthy Fats: Incorporate sources of healthy fats, including avocados, nuts, seeds, and olive oil. Limit saturated and trans fats found in fried and processed foods. Dairy or Dairy Alternatives: Choose low-fat or fat-free dairy products, or plant-based alternatives like almond or soy milk. Portion Control: Be mindful of portion sizes to avoid overeating. Pay attention to hunger and satisfaction cues. Limit Added Sugars: Minimize the consumption of sugary beverages, snacks, and desserts. Check food labels for added sugars and opt for natural sources of sweetness such as whole fruits. Hydration: Drink plenty of water throughout the day. Limit sugary drinks and excessive caffeine intake. Moderate Sodium Intake: Reduce the consumption of high-sodium foods. Use herbs and spices for flavor instead of excessive salt. Meal Planning and Preparation: Plan and prepare meals ahead of time to make healthier choices more convenient. Include a mix of food groups in each meal. Limit Processed Foods: Minimize the intake of highly processed and packaged foods that are often high in added sugars, salt, and unhealthy fats. Regular Physical Activity: Combine a healthy diet with regular physical activity for overall well-being. Aim for at least 150 minutes of moderate-intensity aerobic exercise per week, along with strength training. Moderation and Balance: Enjoy treats and indulgent foods in moderation, emphasizing balance rather than strict restriction.  Handouts Previously Provided Include  Plant Based Protein Supplements Phase 4  Learning Style & Readiness for Change Teaching method utilized: Visual & Auditory  Demonstrated degree of understanding via: Teach Back  Readiness Level: Change in progress. Barriers to learning/adherence to lifestyle  change: None identified  RD's Notes for Next Visit Assess adherence to pt chosen goals  MONITORING & EVALUATION Dietary intake, weekly physical activity, body weight.  Next Steps Patient is to follow-up in August

## 2023-01-18 ENCOUNTER — Ambulatory Visit (INDEPENDENT_AMBULATORY_CARE_PROVIDER_SITE_OTHER): Payer: Commercial Managed Care - PPO

## 2023-01-18 DIAGNOSIS — J309 Allergic rhinitis, unspecified: Secondary | ICD-10-CM | POA: Diagnosis not present

## 2023-01-21 MED ORDER — TERBINAFINE HCL 250 MG PO TABS: 250.0000 mg | ORAL_TABLET | Freq: Every day | ORAL | 0 refills | Status: AC

## 2023-01-22 ENCOUNTER — Telehealth: Payer: Self-pay | Admitting: Podiatry

## 2023-01-22 NOTE — Telephone Encounter (Signed)
Lmom for patient to call back to schedule picking up orthotics    Charges need posted

## 2023-01-23 DIAGNOSIS — J3089 Other allergic rhinitis: Secondary | ICD-10-CM | POA: Diagnosis not present

## 2023-01-23 NOTE — Progress Notes (Signed)
EXP 01/23/24 

## 2023-01-25 NOTE — Telephone Encounter (Signed)
Any chance for Xolair? IgE 32 and dust mite positive. Thank you

## 2023-01-25 NOTE — Telephone Encounter (Signed)
Can you please call this patient and check on her breathing? We are working on trying to get a biologic medication approved. One has been denied and we are working on getting approval for the second one now. Thank you

## 2023-01-25 NOTE — Telephone Encounter (Signed)
Thank you :)

## 2023-01-29 ENCOUNTER — Ambulatory Visit (INDEPENDENT_AMBULATORY_CARE_PROVIDER_SITE_OTHER): Payer: Commercial Managed Care - PPO | Admitting: Podiatry

## 2023-01-29 DIAGNOSIS — M7741 Metatarsalgia, right foot: Secondary | ICD-10-CM | POA: Diagnosis not present

## 2023-01-29 DIAGNOSIS — M7742 Metatarsalgia, left foot: Secondary | ICD-10-CM | POA: Diagnosis not present

## 2023-01-29 DIAGNOSIS — M722 Plantar fascial fibromatosis: Secondary | ICD-10-CM | POA: Diagnosis not present

## 2023-01-29 NOTE — Progress Notes (Signed)
Patient presents today to pick up custom molded foot orthotics recommended by Dr. MCDONALD.   Orthotics were dispensed and fit was satisfactory. Reviewed instructions for break-in and wear. Written instructions given to patient.  Patient will follow up as needed.   

## 2023-01-30 ENCOUNTER — Ambulatory Visit (INDEPENDENT_AMBULATORY_CARE_PROVIDER_SITE_OTHER): Payer: Commercial Managed Care - PPO

## 2023-01-30 DIAGNOSIS — J309 Allergic rhinitis, unspecified: Secondary | ICD-10-CM

## 2023-01-31 NOTE — Telephone Encounter (Signed)
Thank you [name]!

## 2023-01-31 NOTE — Telephone Encounter (Signed)
Patient notified that she has not been approved for any biologics to control her asthma at this time. She continues albuterol about 3 times a day with only short term improvement in her symptoms. She reports that she is not able to use any other asthma inhalers or nebulizer treatments due to throat swelling. She refuses to take montelukast due to possible weight gain.

## 2023-01-31 NOTE — Telephone Encounter (Signed)
Do you think we could get Xolair for food allergy? Last IgE 32 on 07/23/2022 and multiple food allergies documented by lab work. Thank you

## 2023-02-01 ENCOUNTER — Other Ambulatory Visit: Payer: Self-pay | Admitting: *Deleted

## 2023-02-01 ENCOUNTER — Ambulatory Visit: Payer: Commercial Managed Care - PPO

## 2023-02-01 MED ORDER — ALBUTEROL SULFATE (2.5 MG/3ML) 0.083% IN NEBU: 2.5000 mg | INHALATION_SOLUTION | RESPIRATORY_TRACT | 1 refills | Status: DC | PRN

## 2023-02-01 NOTE — Telephone Encounter (Signed)
Yes. Please send in albuterol 0.083% to use one vial once every 4 hours as needed for cough or wheeze. Thank you

## 2023-02-01 NOTE — Progress Notes (Incomplete)
Change to sulcus and change met pad to something softer around the same size

## 2023-02-07 ENCOUNTER — Ambulatory Visit (INDEPENDENT_AMBULATORY_CARE_PROVIDER_SITE_OTHER): Payer: Commercial Managed Care - PPO

## 2023-02-07 DIAGNOSIS — J309 Allergic rhinitis, unspecified: Secondary | ICD-10-CM

## 2023-02-07 NOTE — Telephone Encounter (Signed)
Can you please let this patient know that none of the biologic medications have been approved for asthma or food allergy. We can continue to try other asthma medications though. Can you please have her come in for an appointment with one of our MD's to reassess and move forward with treatment. Thank you

## 2023-02-07 NOTE — Telephone Encounter (Signed)
Thank you [name]!

## 2023-02-20 ENCOUNTER — Ambulatory Visit (INDEPENDENT_AMBULATORY_CARE_PROVIDER_SITE_OTHER): Payer: Commercial Managed Care - PPO

## 2023-02-20 DIAGNOSIS — J309 Allergic rhinitis, unspecified: Secondary | ICD-10-CM

## 2023-02-28 ENCOUNTER — Ambulatory Visit (INDEPENDENT_AMBULATORY_CARE_PROVIDER_SITE_OTHER): Payer: Commercial Managed Care - PPO

## 2023-02-28 DIAGNOSIS — J309 Allergic rhinitis, unspecified: Secondary | ICD-10-CM | POA: Diagnosis not present

## 2023-03-13 ENCOUNTER — Ambulatory Visit (INDEPENDENT_AMBULATORY_CARE_PROVIDER_SITE_OTHER): Payer: Commercial Managed Care - PPO

## 2023-03-13 DIAGNOSIS — J309 Allergic rhinitis, unspecified: Secondary | ICD-10-CM

## 2023-03-22 ENCOUNTER — Ambulatory Visit (INDEPENDENT_AMBULATORY_CARE_PROVIDER_SITE_OTHER): Payer: Commercial Managed Care - PPO

## 2023-03-22 DIAGNOSIS — J309 Allergic rhinitis, unspecified: Secondary | ICD-10-CM | POA: Diagnosis not present

## 2023-04-03 ENCOUNTER — Ambulatory Visit (INDEPENDENT_AMBULATORY_CARE_PROVIDER_SITE_OTHER): Payer: Commercial Managed Care - PPO | Admitting: Podiatry

## 2023-04-03 ENCOUNTER — Ambulatory Visit (INDEPENDENT_AMBULATORY_CARE_PROVIDER_SITE_OTHER): Payer: Commercial Managed Care - PPO

## 2023-04-03 ENCOUNTER — Encounter: Payer: Self-pay | Admitting: Podiatry

## 2023-04-03 DIAGNOSIS — M21611 Bunion of right foot: Secondary | ICD-10-CM

## 2023-04-03 DIAGNOSIS — M2011 Hallux valgus (acquired), right foot: Secondary | ICD-10-CM

## 2023-04-03 DIAGNOSIS — M722 Plantar fascial fibromatosis: Secondary | ICD-10-CM | POA: Diagnosis not present

## 2023-04-03 DIAGNOSIS — M21619 Bunion of unspecified foot: Secondary | ICD-10-CM

## 2023-04-04 ENCOUNTER — Other Ambulatory Visit: Payer: Self-pay

## 2023-04-04 ENCOUNTER — Ambulatory Visit (INDEPENDENT_AMBULATORY_CARE_PROVIDER_SITE_OTHER): Payer: Commercial Managed Care - PPO | Admitting: Family Medicine

## 2023-04-04 ENCOUNTER — Encounter: Payer: Self-pay | Admitting: Family Medicine

## 2023-04-04 VITALS — BP 100/60 | HR 70 | Temp 97.6°F | Resp 16 | Wt 174.4 lb

## 2023-04-04 DIAGNOSIS — J309 Allergic rhinitis, unspecified: Secondary | ICD-10-CM | POA: Diagnosis not present

## 2023-04-04 DIAGNOSIS — T7800XA Anaphylactic reaction due to unspecified food, initial encounter: Secondary | ICD-10-CM

## 2023-04-04 DIAGNOSIS — J4520 Mild intermittent asthma, uncomplicated: Secondary | ICD-10-CM

## 2023-04-04 DIAGNOSIS — T7800XD Anaphylactic reaction due to unspecified food, subsequent encounter: Secondary | ICD-10-CM

## 2023-04-04 NOTE — Progress Notes (Signed)
[address] [place] [address] Dept: [phone]  FOLLOW UP NOTE  Patient ID: Emily [name], female    DOB: 07/31/1981  Age: 42 y.o. MRN: [id number] Date of Office Visit: 04/04/2023  Assessment  Chief Complaint: Follow-up (Everything has been going well so far.)  HPI Emily [name] Emily [name] is a 42 year old female who presents to the clinic for follow-up visit.  She was last seen in this clinic on 01/03/2023 by Thermon Leyland, FNP, for evaluation of asthma, allergic rhinitis on allergen immunotherapy, atopic dermatitis, pruritus, and food allergy to fish, shellfish, lamb, beef, pork, dairy, artificial sweeteners, and agave.  At today's visit, she reports that her asthma has been much more well-controlled she reports symptoms including shortness of breath occurring about once a day, decreased wheezing, and persistent cough producing clear thin mucus.  She continues albuterol before activity and uses albuterol for asthma symptoms about 3 to 5 days a week.  She is unable to use any asthma medications other than albuterol.  We have submitted for a biologic therapy, however, this was denied due to inability to use inhaled corticosteroid for asthma step therapy.  The patient reports that she is extremely pleased with how her asthma has been controlled since her last visit to this clinic.  Allergic rhinitis is reported as moderately well-controlled with nasal congestion as the main symptom.  She continues Flonase as needed and is not currently using Atrovent.  She is not currently taking an antihistamine as she believes that this will induce weight gain.  She continues allergen immunotherapy directed toward pets, dust mite, grass pollen, and mold with no large or local reactions.  She began allergen immunotherapy on 04/30/2022.  She reports a significant decrease in her symptoms of allergic rhinitis while continuing on allergen immunotherapy.  Atopic dermatitis is reported as moderately well-controlled with red  and itchy areas occurring mainly on her arms and resolving in 1 to 2 days after applying Benadryl cream.  She reports these areas are occasionally pruritic.  She continues a moisturizing routine and is not currently using a steroid cream.  She continues to avoid lamb, beef, pork, dairy, artificial sweeteners, and the coughing.  She reports that she is currently eating fish and shellfish without adverse reaction.  Her last food allergy skin prick testing was on 04/09/2022 and was negative to the selected foods tested.  Lab testing on 10/04/2022 was positive to milk, pork, lamb, and beef. EpiPen's are up-to-date.  Her current medications are listed in the chart.  Drug Allergies:  Allergies  Allergen Reactions   Codeine Diarrhea and Nausea And Vomiting    Physical Exam: BP 100/60   Pulse 70   Temp 97.6 F (36.4 C) (Temporal)   Resp 16   Wt 174 lb 6.4 oz (79.1 kg)   LMP 06/01/2015 (Within Days)   SpO2 100%   BMI 26.52 kg/m    Physical Exam Vitals reviewed.  Constitutional:      Appearance: Normal appearance.  HENT:     Head: Normocephalic and atraumatic.     Right Ear: Tympanic membrane normal.     Left Ear: Tympanic membrane normal.     Nose:     Comments: Bilateral nares slightly erythematous with clear nasal drainage noted.  Pharynx normal.  Ears normal.  Eyes normal.    Mouth/Throat:     Pharynx: Oropharynx is clear.  Eyes:     Conjunctiva/sclera: Conjunctivae normal.  Cardiovascular:     Rate and Rhythm: Normal rate and  regular rhythm.     Heart sounds: Normal heart sounds. No murmur heard. Pulmonary:     Effort: Pulmonary effort is normal.     Breath sounds: Normal breath sounds.     Comments: Lungs clear to auscultation Musculoskeletal:        General: Normal range of motion.     Cervical back: Normal range of motion and neck supple.  Skin:    General: Skin is warm and dry.  Neurological:     Mental Status: She is alert and oriented to person, place, and time.   Psychiatric:        Mood and Affect: Mood normal.        Behavior: Behavior normal.        Thought Content: Thought content normal.        Judgment: Judgment normal.     Diagnostics: FVC 3.75 which is 90% of predicted value, FEV1 3.09 which is 91% of predicted value.  Spirometry indicates normal ventilatory function.  Assessment and Plan: 1. Allergic rhinitis, unspecified seasonality, unspecified trigger   2. Allergy with anaphylaxis due to food   3. Mild intermittent asthma without complication     No orders of the defined types were placed in this encounter.   Patient Instructions  Asthma Continue albuterol once every 4 hours as needed for cough or wheeze You may use albuterol 2 puffs 5-15 minutes before activity to decrease cough or wheeze For asthma flare, begin Pulmicort 0.5 mg via nebulizer twice a day to prevent cough or wheeze. You may stop in 2 weeks or until cough and wheeze free.   Allergic rhinitis Continue allergen avoidance measures directed toward pollen, mold, pet, and dust mite as listed below Continue Flonase nasal spray 2 sprays in each nostril once a day as needed for stuffy nose Continue ipratropium nasal spray 2 sprays in each nostril twice a day as needed for nasal symptoms Consider saline nasal rinses as needed for nasal symptoms. Use this before any medicated nasal sprays for best result Continue allergen immunotherapy per protocol and have access to an epinephrine autoinjector set  Pruritus You can begin an antihistamine once a day as needed for itch. Some examples of over the counter antihistamines include Zyrtec (cetirizine), Xyzal (levocetirizine), Allegra (fexofenadine), and Claritin (loratidine).   Atopic dermatitis Continue a daily moisturizer twice a day For red and itchy areas below your face, begin triamcinolone 0.1% ointment up to twice a day as needed. Do not use this medication longer than 2 weeks in a row  Food allergy Continue to  avoid lamb, beef, pork, dairy products, artificial sweeteners, and agave.  In case of an allergic reaction, take Benadryl 50 mg every 4 hours, and if life-threatening symptoms occur, inject with EpiPen 0.3 mg. If your symptoms re-occur, begin a journal of events that occurred for up to 6 hours before your symptoms began including symptoms you experienced,foods and beverages consumed, and medications taken. Consider in office food challenges to some of these foods.  Call the clinic if you are interested in setting up an appointment for a food challenge A lab has been ordered to evaluate for alpha gal allergy. You may get this lab at any [facility] location.   Call the clinic if this treatment plan is not working well for you.  Follow up in 6 months or sooner if needed.   Return in about 6 months (around 10/05/2023), or if symptoms worsen or fail to improve.    Thank you for the  opportunity to care for this patient.  Please do not hesitate to contact me with questions.  Thermon Leyland, FNP [facility]

## 2023-04-04 NOTE — Patient Instructions (Addendum)
Asthma Continue albuterol once every 4 hours as needed for cough or wheeze You may use albuterol 2 puffs 5-15 minutes before activity to decrease cough or wheeze For asthma flare, begin Pulmicort 0.5 mg via nebulizer twice a day to prevent cough or wheeze. You may stop in 2 weeks or until cough and wheeze free.   Allergic rhinitis Continue allergen avoidance measures directed toward pollen, mold, pet, and dust mite as listed below Continue Flonase nasal spray 2 sprays in each nostril once a day as needed for stuffy nose Continue ipratropium nasal spray 2 sprays in each nostril twice a day as needed for nasal symptoms Consider saline nasal rinses as needed for nasal symptoms. Use this before any medicated nasal sprays for best result Continue allergen immunotherapy per protocol and have access to an epinephrine autoinjector set  Pruritus You can begin an antihistamine once a day as needed for itch. Some examples of over the counter antihistamines include Zyrtec (cetirizine), Xyzal (levocetirizine), Allegra (fexofenadine), and Claritin (loratidine).   Atopic dermatitis Continue a daily moisturizer twice a day For red and itchy areas below your face, begin triamcinolone 0.1% ointment up to twice a day as needed. Do not use this medication longer than 2 weeks in a row  Food allergy Continue to avoid lamb, beef, pork, dairy products, artificial sweeteners, and agave.  In case of an allergic reaction, take Benadryl 50 mg every 4 hours, and if life-threatening symptoms occur, inject with EpiPen 0.3 mg. If your symptoms re-occur, begin a journal of events that occurred for up to 6 hours before your symptoms began including symptoms you experienced,foods and beverages consumed, and medications taken. Consider in office food challenges to some of these foods.  Call the clinic if you are interested in setting up an appointment for a food challenge A lab has been ordered to evaluate for alpha gal allergy.  You may get this lab at any [facility] location.   Call the clinic if this treatment plan is not working well for you.  Follow up in 6 months or sooner if needed.  Reducing Pollen Exposure The American Academy of Allergy, Asthma and Immunology suggests the following steps to reduce your exposure to pollen during allergy seasons. Do not hang sheets or clothing out to dry; pollen may collect on these items. Do not mow lawns or spend time around freshly cut grass; mowing stirs up pollen. Keep windows closed at night.  Keep car windows closed while driving. Minimize morning activities outdoors, a time when pollen counts are usually at their highest. Stay indoors as much as possible when pollen counts or humidity is high and on windy days when pollen tends to remain in the air longer. Use air conditioning when possible.  Many air conditioners have filters that trap the pollen spores. Use a HEPA room air filter to remove pollen form the indoor air you breathe.  Control of Dog or Cat Allergen Avoidance is the best way to manage a dog or cat allergy. If you have a dog or cat and are allergic to dog or cats, consider removing the dog or cat from the home. If you have a dog or cat but don't want to find it a new home, or if your family wants a pet even though someone in the household is allergic, here are some strategies that may help keep symptoms at bay:  Keep the pet out of your bedroom and restrict it to only a few rooms. Be advised that keeping the  dog or cat in only one room will not limit the allergens to that room. Don't pet, hug or kiss the dog or cat; if you do, wash your hands with soap and water. High-efficiency particulate air (HEPA) cleaners run continuously in a bedroom or living room can reduce allergen levels over time. Regular use of a high-efficiency vacuum cleaner or a central vacuum can reduce allergen levels. Giving your dog or cat a bath at least once a week can reduce airborne  allergen.   Control of Dust Mite Allergen Dust mites play a major role in allergic asthma and rhinitis. They occur in environments with high humidity wherever human skin is found. Dust mites absorb humidity from the atmosphere (ie, they do not drink) and feed on organic matter (including shed human and animal skin). Dust mites are a microscopic type of insect that you cannot see with the naked eye. High levels of dust mites have been detected from mattresses, pillows, carpets, upholstered furniture, bed covers, clothes, soft toys and any woven material. The principal allergen of the dust mite is found in its feces. A gram of dust may contain 1,000 mites and 250,000 fecal particles. Mite antigen is easily measured in the air during house cleaning activities. Dust mites do not bite and do not cause harm to humans, other than by triggering allergies/asthma.  Ways to decrease your exposure to dust mites in your home:  1. Encase mattresses, box springs and pillows with a mite-impermeable barrier or cover  2. Wash sheets, blankets and drapes weekly in hot water (130 F) with detergent and dry them in a dryer on the hot setting.  3. Have the room cleaned frequently with a vacuum cleaner and a damp dust-mop. For carpeting or rugs, vacuuming with a vacuum cleaner equipped with a high-efficiency particulate air (HEPA) filter. The dust mite allergic individual should not be in a room which is being cleaned and should wait 1 hour after cleaning before going into the room.  4. Do not sleep on upholstered furniture (eg, couches).  5. If possible removing carpeting, upholstered furniture and drapery from the home is ideal. Horizontal blinds should be eliminated in the rooms where the person spends the most time (bedroom, study, television room). Washable vinyl, roller-type shades are optimal.  6. Remove all non-washable stuffed toys from the bedroom. Wash stuffed toys weekly like sheets and blankets above.  7.  Reduce indoor humidity to less than 50%. Inexpensive humidity monitors can be purchased at most hardware stores. Do not use a humidifier as can make the problem worse and are not recommended.  Control of Mold Allergen Mold and fungi can grow on a variety of surfaces provided certain temperature and moisture conditions exist.  Outdoor molds grow on plants, decaying vegetation and soil.  The major outdoor mold, Alternaria and Cladosporium, are found in very high numbers during hot and dry conditions.  Generally, a late Summer - Fall peak is seen for common outdoor fungal spores.  Rain will temporarily lower outdoor mold spore count, but counts rise rapidly when the rainy period ends.  The most important indoor molds are Aspergillus and Penicillium.  Dark, humid and poorly ventilated basements are ideal sites for mold growth.  The next most common sites of mold growth are the bathroom and the kitchen.  Outdoor [employer] Use air conditioning and keep windows closed Avoid exposure to decaying vegetation. Avoid leaf raking. Avoid grain handling. Consider wearing a face mask if working in moldy areas.  Indoor  Mold Control Maintain humidity below 50%. Clean washable surfaces with 5% bleach solution. Remove sources e.g. Contaminated carpets.

## 2023-04-10 NOTE — Progress Notes (Signed)
Chief Complaint  Patient presents with   Toe Pain    "I'm having pain in my big toe on my right foot.  I had surgery on it years ago."   Foot Pain    "I was supposed to get my orthotics but I haven't received them.  I tried to call but never got a call back."    HPI: 42 y.o. female presenting today for follow-up evaluation of chronic bilateral foot pain as well as some pain and tenderness associated to the right great toe joint.  Patient was unsatisfied with her last physician visit here in our practice and requested new consult/second opinion.  She continues to have bilateral heel pain.  She says that here in our office she was scanned for custom orthotics but she has never received the orthotics.  Presenting for further treatment and evaluation  Past Medical History:  Diagnosis Date   Allergy    Anemia    Asthma    sports induced inhalers   Bipolar 1 disorder (HCC)    Conversion disorder    Depression    History of kidney stones    Insomnia    Migraines    migraines   MRSA infection 2010   Ovarian cyst    left 2.5cm   Sleep apnea    has cpap   Vitamin B 12 deficiency    Vitamin D deficiency     Past Surgical History:  Procedure Laterality Date   BARIATRIC SURGERY  08/14/2022   BUNIONECTOMY     ESOPHAGOGASTRODUODENOSCOPY (EGD) WITH PROPOFOL N/A 11/03/2018   Procedure: ESOPHAGOGASTRODUODENOSCOPY (EGD) WITH PROPOFOL;  Surgeon: Wyline Mood, MD;  Location: [facility] ENDOSCOPY;  Service: Gastroenterology;  Laterality: N/A;   HAMMER TOE SURGERY     HAND SURGERY  09/24/2012   cyst removed   INTRAUTERINE DEVICE (IUD) INSERTION N/A 05/09/2015   Procedure: INTRAUTERINE DEVICE (IUD) INSERTION;  Surgeon: Hildred Laser, MD;  Location: [facility] ORS;  Service: Gynecology;  Laterality: N/A;   LAPAROSCOPIC ASSISTED VAGINAL HYSTERECTOMY N/A 06/20/2015   Procedure: LAPAROSCOPIC ASSISTED VAGINAL HYSTERECTOMY, BILATERAL SALPINGECTOMY, CYSTOSCOPY, IUD REMOVAL ;  Surgeon: Hildred Laser, MD;  Location:  [facility] ORS;  Service: Gynecology;  Laterality: N/A;   LAPAROSCOPIC TUBAL LIGATION Bilateral 05/09/2015   Procedure: LAPAROSCOPIC TUBAL LIGATION;  Surgeon: Hildred Laser, MD;  Location: [facility] ORS;  Service: Gynecology;  Laterality: Bilateral;   NASAL SINUS SURGERY     TOTAL ABDOMINAL HYSTERECTOMY W/ BILATERAL SALPINGOOPHORECTOMY     TOTAL ABDOMINAL HYSTERECTOMY W/ BILATERAL SALPINGOOPHORECTOMY     TUBAL LIGATION  09/24/2005   UPPER GI ENDOSCOPY N/A 08/14/2022   Procedure: UPPER GI ENDOSCOPY;  Surgeon: Luretha Murphy, MD;  Location: [facility] ORS;  Service: General;  Laterality: N/A;    Allergies  Allergen Reactions   Codeine Diarrhea and Nausea And Vomiting     Physical Exam: General: The patient is alert and oriented x3 in no acute distress.  Dermatology: Skin is warm, dry and supple bilateral lower extremities.   Vascular: Palpable pedal pulses bilaterally. Capillary refill within normal limits.  No appreciable edema.  No erythema.  Neurological: Grossly intact via light touch  Musculoskeletal Exam: With weightbearing and loading of the forefoot there is some slight prominence of the first metatarsal head.  There is also some tenderness with palpation along the plantar fascia bilateral.  Radiographic Exam RT foot 04/03/2023:  Normal osseous mineralization. Joint spaces preserved.  No fractures or osseous irregularities noted.  There are some slight medial deviation of the first metatarsal  with slight prominence noted visualized on medial oblique view  Assessment/Plan of Care: 1.  Mild hallux valgus right 2.  History of chronic plantar fasciitis bilateral  -Apparently the patient was molded for custom orthotics here in our office however she has never received the orthotics.  New order was placed today and the patient was recasted for orthotics.  Medically necessary to support the medial longitudinal arch of the foot and alleviate the plantar fascial heel pain -For now recommend conservative  treatment of the mild hallux valgus deformity to the right foot.  Recommend wide fitting shoes.  The orthotic should also help alleviate some pressure and pain to the great toe joint -Return to clinic 4 weeks orthotics pickup       Felecia Shelling, DPM [facility]  Dr. Felecia Shelling, DPM    [address] [place], [place] [address]                Office [phone]  Fax [phone]

## 2023-04-12 ENCOUNTER — Ambulatory Visit (INDEPENDENT_AMBULATORY_CARE_PROVIDER_SITE_OTHER): Payer: Commercial Managed Care - PPO | Admitting: Podiatry

## 2023-04-12 DIAGNOSIS — M7742 Metatarsalgia, left foot: Secondary | ICD-10-CM

## 2023-04-12 DIAGNOSIS — M7741 Metatarsalgia, right foot: Secondary | ICD-10-CM

## 2023-04-12 DIAGNOSIS — M722 Plantar fascial fibromatosis: Secondary | ICD-10-CM

## 2023-04-12 DIAGNOSIS — M2011 Hallux valgus (acquired), right foot: Secondary | ICD-10-CM

## 2023-04-12 NOTE — Progress Notes (Unsigned)
Patient presents today to pick up custom orthotics   Patient was dispensed 1 pair of custom orthotics  Fit was satisfactory. Instructions for break-in and wear was reviewed and a copy was given to the patient.    

## 2023-04-15 ENCOUNTER — Ambulatory Visit (INDEPENDENT_AMBULATORY_CARE_PROVIDER_SITE_OTHER): Payer: Commercial Managed Care - PPO

## 2023-04-15 DIAGNOSIS — J309 Allergic rhinitis, unspecified: Secondary | ICD-10-CM | POA: Diagnosis not present

## 2023-04-17 ENCOUNTER — Ambulatory Visit (INDEPENDENT_AMBULATORY_CARE_PROVIDER_SITE_OTHER): Payer: Commercial Managed Care - PPO | Admitting: Dermatology

## 2023-04-17 VITALS — BP 126/81 | HR 70

## 2023-04-17 DIAGNOSIS — L821 Other seborrheic keratosis: Secondary | ICD-10-CM

## 2023-04-17 DIAGNOSIS — Z1283 Encounter for screening for malignant neoplasm of skin: Secondary | ICD-10-CM | POA: Diagnosis not present

## 2023-04-17 DIAGNOSIS — D239 Other benign neoplasm of skin, unspecified: Secondary | ICD-10-CM

## 2023-04-17 DIAGNOSIS — L858 Other specified epidermal thickening: Secondary | ICD-10-CM

## 2023-04-17 DIAGNOSIS — D225 Melanocytic nevi of trunk: Secondary | ICD-10-CM

## 2023-04-17 DIAGNOSIS — D485 Neoplasm of uncertain behavior of skin: Secondary | ICD-10-CM

## 2023-04-17 DIAGNOSIS — L814 Other melanin hyperpigmentation: Secondary | ICD-10-CM

## 2023-04-17 DIAGNOSIS — L578 Other skin changes due to chronic exposure to nonionizing radiation: Secondary | ICD-10-CM

## 2023-04-17 DIAGNOSIS — W908XXA Exposure to other nonionizing radiation, initial encounter: Secondary | ICD-10-CM

## 2023-04-17 DIAGNOSIS — D229 Melanocytic nevi, unspecified: Secondary | ICD-10-CM

## 2023-04-17 DIAGNOSIS — D2272 Melanocytic nevi of left lower limb, including hip: Secondary | ICD-10-CM

## 2023-04-17 HISTORY — DX: Other benign neoplasm of skin, unspecified: D23.9

## 2023-04-17 NOTE — Patient Instructions (Addendum)
Wound Care Instructions  Cleanse wound gently with soap and water once a day then pat dry with clean gauze. Apply a thin coat of Petrolatum (petroleum jelly, "Vaseline") over the wound (unless you have an allergy to this). We recommend that you use a new, sterile tube of Vaseline. Do not pick or remove scabs. Do not remove the yellow or white "healing tissue" from the base of the wound.  Cover the wound with fresh, clean, nonstick gauze and secure with paper tape. You may use Band-Aids in place of gauze and tape if the wound is small enough, but would recommend trimming much of the tape off as there is often too much. Sometimes Band-Aids can irritate the skin.  You should call the office for your biopsy report after 1 week if you have not already been contacted.  If you experience any problems, such as abnormal amounts of bleeding, swelling, significant bruising, significant pain, or evidence of infection, please call the office immediately.  FOR ADULT SURGERY PATIENTS: If you need something for pain relief you may take 1 extra strength Tylenol (acetaminophen) AND 2 Ibuprofen (200mg each) together every 4 hours as needed for pain. (do not take these if you are allergic to them or if you have a reason you should not take them.) Typically, you may only need pain medication for 1 to 3 days.     Due to recent changes in healthcare laws, you may see results of your pathology and/or laboratory studies on MyChart before the doctors have had a chance to review them. We understand that in some cases there may be results that are confusing or concerning to you. Please understand that not all results are received at the same time and often the doctors may need to interpret multiple results in order to provide you with the best plan of care or course of treatment. Therefore, we ask that you please give us 2 business days to thoroughly review all your results before contacting the office for clarification. Should  we see a critical lab result, you will be contacted sooner.   If You Need Anything After Your Visit  If you have any questions or concerns for your doctor, please call our main line at [phone] and press option 4 to reach your doctor's medical assistant. If no one answers, please leave a voicemail as directed and we will return your call as soon as possible. Messages left after 4 pm will be answered the following business day.   You may also send us a message via MyChart. We typically respond to MyChart messages within 1-2 business days.  For prescription refills, please ask your pharmacy to contact our office. Our fax number is [phone].  If you have an urgent issue when the clinic is closed that cannot wait until the next business day, you can page your doctor at the number below.    Please note that while we do our best to be available for urgent issues outside of office hours, we are not available 24/7.   If you have an urgent issue and are unable to reach us, you may choose to seek medical care at your doctor's office, retail clinic, urgent care center, or emergency room.  If you have a medical emergency, please immediately call 911 or go to the emergency department.  Pager Numbers  - Dr. Kowalski: [phone]  - Dr. Moye: [phone]  - Dr. Stewart: [phone]  In the event of inclement weather, please call our main line at   [phone] for an update on the status of any delays or closures.  Dermatology Medication Tips: Please keep the boxes that topical medications come in in order to help keep track of the instructions about where and how to use these. Pharmacies typically print the medication instructions only on the boxes and not directly on the medication tubes.   If your medication is too expensive, please contact our office at [phone] option 4 or send us a message through MyChart.   We are unable to tell what your co-pay for medications will be in  advance as this is different depending on your insurance coverage. However, we may be able to find a substitute medication at lower cost or fill out paperwork to get insurance to cover a needed medication.   If a prior authorization is required to get your medication covered by your insurance company, please allow us 1-2 business days to complete this process.  Drug prices often vary depending on where the prescription is filled and some pharmacies may offer cheaper prices.  The website www.goodrx.com contains coupons for medications through different pharmacies. The prices here do not account for what the cost may be with help from insurance (it may be cheaper with your insurance), but the website can give you the price if you did not use any insurance.  - You can print the associated coupon and take it with your prescription to the pharmacy.  - You may also stop by our office during regular business hours and pick up a GoodRx coupon card.  - If you need your prescription sent electronically to a different pharmacy, notify our office through [facility] MyChart or by phone at [phone] option 4.     Si Usted Necesita Algo Despus de Su Visita  Tambin puede enviarnos un mensaje a travs de MyChart. Por lo general respondemos a los mensajes de MyChart en el transcurso de 1 a 2 das hbiles.  Para renovar recetas, por favor pida a su farmacia que se ponga en contacto con nuestra oficina. Nuestro nmero de fax es el [phone].  Si tiene un asunto urgente cuando la clnica est cerrada y que no puede esperar hasta el siguiente da hbil, puede llamar/localizar a su doctor(a) al nmero que aparece a continuacin.   Por favor, tenga en cuenta que aunque hacemos todo lo posible para estar disponibles para asuntos urgentes fuera del horario de oficina, no estamos disponibles las 24 horas del da, los 7 das de la semana.   Si tiene un problema urgente y no puede comunicarse con nosotros, puede  optar por buscar atencin mdica  en el consultorio de su doctor(a), en una clnica privada, en un centro de atencin urgente o en una sala de emergencias.  Si tiene una emergencia mdica, por favor llame inmediatamente al 911 o vaya a la sala de emergencias.  Nmeros de bper  - Dr. Kowalski: [phone]  - Dra. Moye: [phone]  - Dra. Stewart: [phone]  En caso de inclemencias del tiempo, por favor llame a nuestra lnea principal al [phone] para una actualizacin sobre el estado de cualquier retraso o cierre.  Consejos para la medicacin en dermatologa: Por favor, guarde las cajas en las que vienen los medicamentos de uso tpico para ayudarle a seguir las instrucciones sobre dnde y cmo usarlos. Las farmacias generalmente imprimen las instrucciones del medicamento slo en las cajas y no directamente en los tubos del medicamento.   Si su medicamento es muy caro, por favor, pngase en contacto con   nuestra oficina llamando al [phone] y presione la opcin 4 o envenos un mensaje a travs de MyChart.   No podemos decirle cul ser su copago por los medicamentos por adelantado ya que esto es diferente dependiendo de la cobertura de su seguro. Sin embargo, es posible que podamos encontrar un medicamento sustituto a menor costo o llenar un formulario para que el seguro cubra el medicamento que se considera necesario.   Si se requiere una autorizacin previa para que su compaa de seguros cubra su medicamento, por favor permtanos de 1 a 2 das hbiles para completar este proceso.  Los precios de los medicamentos varan con frecuencia dependiendo del lugar de dnde se surte la receta y alguna farmacias pueden ofrecer precios ms baratos.  El sitio web www.goodrx.com tiene cupones para medicamentos de diferentes farmacias. Los precios aqu no tienen en cuenta lo que podra costar con la ayuda del seguro (puede ser ms barato con su seguro), pero el sitio web puede darle el  precio si no utiliz ningn seguro.  - Puede imprimir el cupn correspondiente y llevarlo con su receta a la farmacia.  - Tambin puede pasar por nuestra oficina durante el horario de atencin regular y recoger una tarjeta de cupones de GoodRx.  - Si necesita que su receta se enve electrnicamente a una farmacia diferente, informe a nuestra oficina a travs de MyChart de [place] o por telfono llamando al [phone] y presione la opcin 4.  

## 2023-04-17 NOTE — Progress Notes (Signed)
Follow-Up Visit   Subjective  Emily [name] is a 42 y.o. female who presents for the following: Skin Cancer Screening and Full Body Skin Exam  The patient presents for Total-Body Skin Exam (TBSE) for skin cancer screening and mole check. The patient has spots, moles and lesions to be evaluated, some may be new or changing and the patient may have concern these could be cancer.    The following portions of the chart were reviewed this encounter and updated as appropriate: medications, allergies, medical history  Review of Systems:  No other skin or systemic complaints except as noted in HPI or Assessment and Plan.  Objective  Well appearing patient in no apparent distress; mood and affect are within normal limits.  A full examination was performed including scalp, head, eyes, ears, nose, lips, neck, chest, axillae, abdomen, back, buttocks, bilateral upper extremities, bilateral lower extremities, hands, feet, fingers, toes, fingernails, and toenails. All findings within normal limits unless otherwise noted below.   Relevant physical exam findings are noted in the Assessment and Plan.  L upper back paraspinal 1.1 cm irregular brown macule.       Assessment & Plan   SKIN CANCER SCREENING PERFORMED TODAY.  ACTINIC DAMAGE - Chronic condition, secondary to cumulative UV/sun exposure - diffuse scaly erythematous macules with underlying dyspigmentation - Recommend daily broad spectrum sunscreen SPF 30+ to sun-exposed areas, reapply every 2 hours as needed.  - Staying in the shade or wearing long sleeves, sun glasses (UVA+UVB protection) and wide brim hats (4-inch brim around the entire circumference of the hat) are also recommended for sun protection.  - Call for new or changing lesions.  LENTIGINES, SEBORRHEIC KERATOSES, HEMANGIOMAS - Benign normal skin lesions - Benign-appearing - Call for any changes  MELANOCYTIC NEVI - L lat foot, R chest - will bx at follow up appt in 2  months - Tan-brown and/or pink-flesh-colored symmetric macules and papules - Benign appearing on exam today - Observation - Call clinic for new or changing moles - Recommend daily use of broad spectrum spf 30+ sunscreen to sun-exposed areas.   Neoplasm of uncertain behavior of skin L upper back paraspinal  Epidermal / dermal shaving  Lesion diameter (cm):  1.1 Informed consent: discussed and consent obtained   Timeout: patient name, date of birth, surgical site, and procedure verified   Procedure prep:  Patient was prepped and draped in usual sterile fashion Prep type:  Isopropyl alcohol Anesthesia: the lesion was anesthetized in a standard fashion   Anesthetic:  1% lidocaine w/ epinephrine 1-100,000 buffered w/ 8.4% NaHCO3 Instrument used: flexible razor blade   Hemostasis achieved with: pressure, aluminum chloride and electrodesiccation   Outcome: patient tolerated procedure well   Post-procedure details: sterile dressing applied and wound care instructions given   Dressing type: bandage and petrolatum    Specimen 1 - Surgical pathology Differential Diagnosis: ISK r/o dysplasia Check Margins: Yes   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 Genetic in nature. No cure. - Observe. - If desired, patient can use an emollient (moisturizer) containing ammonium lactate (AmLactin), urea or salicylic acid once a day to smooth the area  Recommend starting moisturizer with exfoliant (Urea, Salicylic acid, or Lactic acid) one to two times daily to help smooth rough and bumpy skin.  OTC options include Cetaphil Rough and Bumpy lotion (Urea), Eucerin Roughness Relief lotion or spot treatment cream (Urea), CeraVe SA lotion/cream for Rough and Bumpy skin (Sal Acid), Gold Bond Rough and  Bumpy cream (Sal Acid), and AmLactin 12% lotion/cream (Lactic Acid).  If applying in morning, also apply sunscreen to sun-exposed areas, since these exfoliating moisturizers can  increase sensitivity to sun.    Return in about 2 months (around 06/18/2023) for bx x 2.  I, Cari Caraway, CMA, am acting as scribe for Armida Sans, MD .  Documentation: I have reviewed the above documentation for accuracy and completeness, and I agree with the above.  Armida Sans, MD

## 2023-04-21 ENCOUNTER — Encounter: Payer: Self-pay | Admitting: Dermatology

## 2023-04-24 ENCOUNTER — Telehealth: Payer: Self-pay

## 2023-04-24 NOTE — Telephone Encounter (Signed)
Advised pt of bx result/sh ?

## 2023-04-24 NOTE — Telephone Encounter (Signed)
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

## 2023-04-26 ENCOUNTER — Ambulatory Visit (INDEPENDENT_AMBULATORY_CARE_PROVIDER_SITE_OTHER): Payer: Commercial Managed Care - PPO

## 2023-04-26 DIAGNOSIS — J309 Allergic rhinitis, unspecified: Secondary | ICD-10-CM | POA: Diagnosis not present

## 2023-05-03 ENCOUNTER — Ambulatory Visit (INDEPENDENT_AMBULATORY_CARE_PROVIDER_SITE_OTHER): Payer: Commercial Managed Care - PPO

## 2023-05-03 DIAGNOSIS — J309 Allergic rhinitis, unspecified: Secondary | ICD-10-CM

## 2023-05-06 ENCOUNTER — Telehealth: Payer: Self-pay | Admitting: Family Medicine

## 2023-05-06 NOTE — Telephone Encounter (Signed)
Patient called to get test results. 

## 2023-05-06 NOTE — Progress Notes (Signed)
Can you please let this patient know that her lab work was positive to alpha gal. Please have her continue to avoid mammalian products as she has been doing. Please have her be especially careful with sources of gelatin such as medicine capsules. Please have access to an epipen set at all times. Thank you

## 2023-05-07 NOTE — Telephone Encounter (Signed)
A nurse has called and went over lab results with the patient.

## 2023-05-13 ENCOUNTER — Ambulatory Visit (INDEPENDENT_AMBULATORY_CARE_PROVIDER_SITE_OTHER): Payer: Commercial Managed Care - PPO | Admitting: *Deleted

## 2023-05-13 ENCOUNTER — Encounter: Payer: Commercial Managed Care - PPO | Attending: Surgery | Admitting: Skilled Nursing Facility1

## 2023-05-13 VITALS — Ht 67.72 in | Wt 162.5 lb

## 2023-05-13 DIAGNOSIS — J309 Allergic rhinitis, unspecified: Secondary | ICD-10-CM

## 2023-05-13 DIAGNOSIS — E669 Obesity, unspecified: Secondary | ICD-10-CM | POA: Diagnosis not present

## 2023-05-13 NOTE — Progress Notes (Signed)
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 Yes; vitamin D, vitamin B12, iron  Bowel Habits: Every day to every other day no complaints   Body Composition Scale 08/28/2022 10/09/2022 01/14/2023 05/13/23  Current Body Weight 248.0 230.8 198.1  162.5 lb  Total Body Fat % 42.4 41.0 35.7 28.3  Visceral Fat 12 11 9 6   Fat-Free Mass % 57.5 58.9 64.2 71.6   Total Body Water % 43.2 43.9 46.6 50.3  Muscle-Mass lbs 35.0 34.2 34.5 34.2  BMI 37.7 35.0 30 24.6  Body Fat Displacement             Torso  lbs 65.3 58.6 43.8 28.4         Left Leg  lbs 13.0 11.7 8.7 5.6         Right Leg  lbs 13.0 11.7 8.7 5.6         Left Arm  lbs 6.5 5.8 4.3 2.8         Right Arm   lbs 6.5 5.8 4.3 2.8     Lifestyle & Dietary Hx  Pt states she has been busy with getting kids prepared to start school and has not spent as much time on physical activity as she would like.  Pt states that she can tolerate goat and sheep's cheese in moderation pertaining to Alpha-gal diagnosis.  Pt expresses that fruit (and highly concentrated sugars) upset her stomach. Pt also endorsed that she has not tried many grain-foods; she tried rice but felt uncomfortably full.  Pt will occasionally have salads from [employer], [employer], or [employer].     Estimated daily fluid intake: 60-70 oz Estimated daily protein intake: 80 g Supplements: multivitamin and states she cannot do the calcium because it tares her stomach up  Current average weekly physical activity: BELT not consistently; walking outside 1.1 miles a couple times.   24-Hr Dietary Recall First Meal: coffee  w/almond milk creamer, an egg, [place] sausage  Snack: [place] jerky Second Meal: 1.5 cup of salad greens, 1 tsp cheese, 1 serve chicken or shrimp  Snack: veg and humus Third Meal: salad or [place] burger, or chicken breast and greens  Snack:  Beverages: water, Gatorade zero, diluted juice   Post-Op Goals/ Signs/ Symptoms Using straws: no Drinking while eating: no Chewing/swallowing difficulties: no Changes in vision: no Changes to mood/headaches: no Hair loss/changes to skin/nails: some Difficulty focusing/concentrating: no Sweating: no Limb weakness: no Dizziness/lightheadedness: no Palpitations: no Carbonated/caffeinated beverages: no N/V/D/C/Gas: yes, from newly diagnosed allergies Abdominal pain: no Dumping syndrome: no    NUTRITION DIAGNOSIS  Overweight/obesity ([place]-3.3) related to past poor dietary habits and physical inactivity as evidenced by completed bariatric surgery and following dietary guidelines for continued weight loss and healthy nutrition status.     NUTRITION INTERVENTION Nutrition counseling (C-1) and education (E-2) to facilitate bariatric surgery goals, including: Diet advancement to the next phase (phase 4) now including fruits an vegetables. The importance of consuming adequate calories as well as certain nutrients daily due to the body's need for essential vitamins, minerals, and fats The importance of daily physical activity and to reach a goal of at least 150 minutes of moderate to vigorous physical activity  weekly (or as directed by their physician) due to benefits such as increased musculature and improved lab values The importance of intuitive eating specifically learning hunger-satiety cues and understanding the importance of learning a new body: The importance of mindful eating to avoid grazing behaviors  Educated pt on the importance of incorporating whole fruits and vegetables into diet as pt's diet phase advances.  Creation of balanced and diverse  meals to increase the intake of nutrient-rich foods that provide essential vitamins, minerals, fiber, and phytonutrients Variety of Fruits and Vegetables: Aim for a colorful array of fruits and vegetables to ensure a wide range of nutrients. Include a mix of leafy greens, berries, citrus fruits, cruciferous vegetables, and more. Whole Grains: Choose whole grains over refined grains. Examples include brown rice, quinoa, oats, whole wheat, and barley. Lean Proteins: Include lean sources of protein, such as poultry, fish, tofu, legumes, beans, lentils, and low-fat dairy products. Limit red and processed meats. Healthy Fats: Incorporate sources of healthy fats, including avocados, nuts, seeds, and olive oil. Limit saturated and trans fats found in fried and processed foods. Dairy or Dairy Alternatives: Choose low-fat or fat-free dairy products, or plant-based alternatives like almond or soy milk. Portion Control: Be mindful of portion sizes to avoid overeating. Pay attention to hunger and satisfaction cues. Limit Added Sugars: Minimize the consumption of sugary beverages, snacks, and desserts. Check food labels for added sugars and opt for natural sources of sweetness such as whole fruits. Hydration: Drink plenty of water throughout the day. Limit sugary drinks and excessive caffeine intake. Moderate Sodium Intake: Reduce the consumption of high-sodium foods. Use herbs and spices for flavor instead of excessive salt. Meal Planning and Preparation: Plan and prepare meals ahead of time to make healthier choices more convenient. Include a mix of food groups in each meal. Limit Processed Foods: Minimize the intake of highly processed and packaged foods that are often high in added sugars, salt, and unhealthy fats. Regular Physical Activity: Combine a healthy diet with regular physical activity for overall well-being. Aim for at least 150 minutes of moderate-intensity aerobic exercise per week,  along with strength training. Moderation and Balance: Enjoy treats and indulgent foods in moderation, emphasizing balance rather than strict restriction.  Encouraged adding in more complex carbs to meals/snacks Educated patient on expanding intake of starchy vegetables and carbohydrates   Handouts Previously Provided Include  Plant Based Protein Supplements Phase 4 Dietary Maintenance 6 months Post-Bariatric  Learning Style & Readiness for Change Teaching method utilized: Visual & Auditory  Demonstrated degree of understanding via: Teach Back  Readiness Level: Change in progress. Barriers to learning/adherence to lifestyle change: None identified  RD's Notes for Next Visit Assess adherence to pt chosen goals  MONITORING & EVALUATION Dietary intake, weekly physical activity, body weight.  Next Steps Patient is to follow-up in August

## 2023-05-16 DIAGNOSIS — J3081 Allergic rhinitis due to animal (cat) (dog) hair and dander: Secondary | ICD-10-CM

## 2023-05-16 NOTE — Progress Notes (Signed)
VIALS EXP 05-15-24

## 2023-05-24 ENCOUNTER — Ambulatory Visit (INDEPENDENT_AMBULATORY_CARE_PROVIDER_SITE_OTHER): Payer: Commercial Managed Care - PPO | Admitting: *Deleted

## 2023-05-24 DIAGNOSIS — J309 Allergic rhinitis, unspecified: Secondary | ICD-10-CM

## 2023-05-31 ENCOUNTER — Ambulatory Visit (INDEPENDENT_AMBULATORY_CARE_PROVIDER_SITE_OTHER): Payer: Commercial Managed Care - PPO | Admitting: *Deleted

## 2023-05-31 DIAGNOSIS — J309 Allergic rhinitis, unspecified: Secondary | ICD-10-CM

## 2023-06-10 ENCOUNTER — Ambulatory Visit (INDEPENDENT_AMBULATORY_CARE_PROVIDER_SITE_OTHER): Payer: Commercial Managed Care - PPO | Admitting: Family Medicine

## 2023-06-10 ENCOUNTER — Ambulatory Visit
Admission: RE | Admit: 2023-06-10 | Discharge: 2023-06-10 | Disposition: A | Discharge status: Home / Self Care | Payer: Commercial Managed Care - PPO | Attending: Family Medicine | Admitting: Family Medicine

## 2023-06-10 ENCOUNTER — Ambulatory Visit
Admission: RE | Admit: 2023-06-10 | Discharge: 2023-06-10 | Disposition: A | Discharge status: Home / Self Care | Payer: Commercial Managed Care - PPO | Source: Ambulatory Visit | Attending: Family Medicine | Admitting: Family Medicine

## 2023-06-10 ENCOUNTER — Telehealth: Payer: Self-pay | Admitting: Nurse Practitioner

## 2023-06-10 VITALS — BP 115/76 | HR 73 | Temp 97.8°F | Ht 68.0 in | Wt 156.0 lb

## 2023-06-10 DIAGNOSIS — R29898 Other symptoms and signs involving the musculoskeletal system: Secondary | ICD-10-CM | POA: Insufficient documentation

## 2023-06-10 NOTE — Telephone Encounter (Signed)
Copied from CRM [id number]. Topic: General - Other >> Jun 10, 2023  2:34 PM [place] B wrote: Reason for CRM: pt called in says doesn't want to use referral at [facility]  because she was injured there, so she wants a different referral.
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
## 2023-06-11 ENCOUNTER — Encounter: Payer: Self-pay | Admitting: Family Medicine

## 2023-06-12 ENCOUNTER — Ambulatory Visit (INDEPENDENT_AMBULATORY_CARE_PROVIDER_SITE_OTHER): Payer: Commercial Managed Care - PPO | Admitting: *Deleted

## 2023-06-12 DIAGNOSIS — J309 Allergic rhinitis, unspecified: Secondary | ICD-10-CM | POA: Diagnosis not present

## 2023-06-20 ENCOUNTER — Ambulatory Visit: Payer: Commercial Managed Care - PPO | Admitting: Dermatology

## 2023-06-20 ENCOUNTER — Encounter: Payer: Self-pay | Admitting: Dermatology

## 2023-06-20 DIAGNOSIS — D2272 Melanocytic nevi of left lower limb, including hip: Secondary | ICD-10-CM

## 2023-06-20 DIAGNOSIS — D2371 Other benign neoplasm of skin of right lower limb, including hip: Secondary | ICD-10-CM | POA: Diagnosis not present

## 2023-06-20 DIAGNOSIS — Z7189 Other specified counseling: Secondary | ICD-10-CM | POA: Diagnosis not present

## 2023-06-20 DIAGNOSIS — D492 Neoplasm of unspecified behavior of bone, soft tissue, and skin: Secondary | ICD-10-CM | POA: Diagnosis not present

## 2023-06-20 DIAGNOSIS — D239 Other benign neoplasm of skin, unspecified: Secondary | ICD-10-CM

## 2023-06-20 DIAGNOSIS — D229 Melanocytic nevi, unspecified: Secondary | ICD-10-CM | POA: Diagnosis not present

## 2023-06-20 DIAGNOSIS — D225 Melanocytic nevi of trunk: Secondary | ICD-10-CM | POA: Diagnosis not present

## 2023-06-20 DIAGNOSIS — Z86018 Personal history of other benign neoplasm: Secondary | ICD-10-CM

## 2023-06-20 NOTE — Progress Notes (Signed)
Follow-Up Visit   Subjective  Emily [name] is a 42 y.o. female who presents for the following: here for biopsies of nevi on left lateral foot and right chest. Hx of dysplastic nevus.  The patient has spots, moles and lesions to be evaluated, some may be new or changing and the patient may have concern these could be cancer.   The following portions of the chart were reviewed this encounter and updated as appropriate: medications, allergies, medical history  Review of Systems:  No other skin or systemic complaints except as noted in HPI or Assessment and Plan.  Objective  Well appearing patient in no apparent distress; mood and affect are within normal limits.  A focused examination was performed of the following areas: Left foot, chest  Relevant exam findings are noted in the Assessment and Plan.  Right Chest 0.7 x 0.4 cm light to medium brown irregular papule       Left Lateral foot 0.6 cm brown macule         Assessment & Plan   DERMATOFIBROMA Exam: Firm pink/brown papulenodule with dimple sign at right posterior leg and right thigh. Treatment Plan: A dermatofibroma is a benign growth possibly related to trauma, such as an insect bite, cut from shaving, or inflamed acne-type bump.  Treatment options to remove include shave or excision with resulting scar and risk of recurrence.  Since benign-appearing and not bothersome, will observe for now.   Neoplasm of skin (2) Right Chest  Epidermal / dermal shaving  Lesion diameter (cm):  0.7 Informed consent: discussed and consent obtained   Timeout: patient name, date of birth, surgical site, and procedure verified   Procedure prep:  Patient was prepped and draped in usual sterile fashion Prep type:  Isopropyl alcohol Anesthesia: the lesion was anesthetized in a standard fashion   Anesthetic:  1% lidocaine w/ epinephrine 1-100,000 buffered w/ 8.4% NaHCO3 Instrument used: flexible razor blade   Hemostasis  achieved with: pressure, aluminum chloride and electrodesiccation   Outcome: patient tolerated procedure well   Post-procedure details: sterile dressing applied and wound care instructions given   Dressing type: bandage and petrolatum    Specimen 1 - Surgical pathology Differential Diagnosis: R/O dysplastic nevus  Check Margins: Yes  Left Lateral foot  Epidermal / dermal shaving  Lesion diameter (cm):  0.6 Informed consent: discussed and consent obtained   Timeout: patient name, date of birth, surgical site, and procedure verified   Procedure prep:  Patient was prepped and draped in usual sterile fashion Prep type:  Isopropyl alcohol Anesthesia: the lesion was anesthetized in a standard fashion   Anesthetic:  1% lidocaine w/ epinephrine 1-100,000 buffered w/ 8.4% NaHCO3 Instrument used: flexible razor blade   Hemostasis achieved with: pressure, aluminum chloride and electrodesiccation   Outcome: patient tolerated procedure well   Post-procedure details: sterile dressing applied and wound care instructions given   Dressing type: bandage and petrolatum    Specimen 2 - Surgical pathology Differential Diagnosis: R/O dysplastic nevus  Check Margins: Yes  Dermatofibroma  Counseling and coordination of care  History of dysplastic nevus  Melanocytic nevus, unspecified location  History of Dysplastic Nevi - No evidence of recurrence today - Recommend regular full body skin exams - Recommend daily broad spectrum sunscreen SPF 30+ to sun-exposed areas, reapply every 2 hours as needed.  - Call if any new or changing lesions are noted between office visits  MELANOCYTIC NEVI Exam: Tan-brown and/or pink-flesh-colored symmetric macules and papules  Treatment Plan:  Benign appearing on exam today. Recommend observation. Call clinic for new or changing moles. Recommend daily use of broad spectrum spf 30+ sunscreen to sun-exposed areas.   Return in about 1 year (around 06/19/2024) for  TBSE, HxDN.  I, Lawson Radar, CMA, am acting as scribe for Armida Sans, MD.   Documentation: I have reviewed the above documentation for accuracy and completeness, and I agree with the above.  Armida Sans, MD

## 2023-06-20 NOTE — Patient Instructions (Signed)
Wound Care Instructions  Cleanse wound gently with soap and water once a day then pat dry with clean gauze. Apply a thin coat of Petrolatum (petroleum jelly, "Vaseline") over the wound (unless you have an allergy to this). We recommend that you use a new, sterile tube of Vaseline. Do not pick or remove scabs. Do not remove the yellow or white "healing tissue" from the base of the wound.  Cover the wound with fresh, clean, nonstick gauze and secure with paper tape. You may use Band-Aids in place of gauze and tape if the wound is small enough, but would recommend trimming much of the tape off as there is often too much. Sometimes Band-Aids can irritate the skin.  You should call the office for your biopsy report after 1 week if you have not already been contacted.  If you experience any problems, such as abnormal amounts of bleeding, swelling, significant bruising, significant pain, or evidence of infection, please call the office immediately.  FOR ADULT SURGERY PATIENTS: If you need something for pain relief you may take 1 extra strength Tylenol (acetaminophen) AND 2 Ibuprofen (200mg  each) together every 4 hours as needed for pain. (do not take these if you are allergic to them or if you have a reason you should not take them.) Typically, you may only need pain medication for 1 to 3 days.    Recommend daily broad spectrum sunscreen SPF 30+ to sun-exposed areas, reapply every 2 hours as needed. Call for new or changing lesions.  Staying in the shade or wearing long sleeves, sun glasses (UVA+UVB protection) and wide brim hats (4-inch brim around the entire circumference of the hat) are also recommended for sun protection.    Due to recent changes in healthcare laws, you may see results of your pathology and/or laboratory studies on MyChart before the doctors have had a chance to review them. We understand that in some cases there may be results that are confusing or concerning to you. Please understand  that not all results are received at the same time and often the doctors may need to interpret multiple results in order to provide you with the best plan of care or course of treatment. Therefore, we ask that you please give [place] 2 business days to thoroughly review all your results before contacting the office for clarification. Should we see a critical lab result, you will be contacted sooner.   If You Need Anything After Your Visit  If you have any questions or concerns for your doctor, please call our main line at [phone] and press option 4 to reach your doctor's medical assistant. If no one answers, please leave a voicemail as directed and we will return your call as soon as possible. Messages left after 4 pm will be answered the following business day.   You may also send [place] a message via MyChart. We typically respond to MyChart messages within 1-2 business days.  For prescription refills, please ask your pharmacy to contact our office. Our fax number is [phone].  If you have an urgent issue when the clinic is closed that cannot wait until the next business day, you can page your doctor at the number below.    Please note that while we do our best to be available for urgent issues outside of office hours, we are not available 24/7.   If you have an urgent issue and are unable to reach [place], you may choose to seek medical care at your doctor's office,  retail clinic, urgent care center, or emergency room.  If you have a medical emergency, please immediately call 911 or go to the emergency department.  Pager Numbers  - Dr. Gwen Pounds: [phone]  - Dr. Roseanne Reno: [phone]  - Dr. Katrinka Blazing: [phone]   In the event of inclement weather, please call our main line at [phone] for an update on the status of any delays or closures.  Dermatology Medication Tips: Please keep the boxes that topical medications come in in order to help keep track of the instructions about where  and how to use these. Pharmacies typically print the medication instructions only on the boxes and not directly on the medication tubes.   If your medication is too expensive, please contact our office at [phone] option 4 or send [place] a message through MyChart.   We are unable to tell what your co-pay for medications will be in advance as this is different depending on your insurance coverage. However, we may be able to find a substitute medication at lower cost or fill out paperwork to get insurance to cover a needed medication.   If a prior authorization is required to get your medication covered by your insurance company, please allow [place] 1-2 business days to complete this process.  Drug prices often vary depending on where the prescription is filled and some pharmacies may offer cheaper prices.  The website www.goodrx.com contains coupons for medications through different pharmacies. The prices here do not account for what the cost may be with help from insurance (it may be cheaper with your insurance), but the website can give you the price if you did not use any insurance.  - You can print the associated coupon and take it with your prescription to the pharmacy.  - You may also stop by our office during regular business hours and pick up a GoodRx coupon card.  - If you need your prescription sent electronically to a different pharmacy, notify our office through [facility] or by phone at [phone] option 4.     Si Usted Necesita Algo Despus de Su Visita  Tambin puede enviarnos un mensaje a travs de Clinical cytogeneticist. Por lo general respondemos a los mensajes de MyChart en el transcurso de 1 a 2 das hbiles.  Para renovar recetas, por favor pida a su farmacia que se ponga en contacto con nuestra oficina. Annie Sable de fax es [place] [phone].  Si tiene un asunto urgente cuando la clnica est cerrada y que no puede esperar hasta el siguiente da hbil, puede llamar/localizar a  su doctor(a) al nmero que aparece a continuacin.   Por favor, tenga en cuenta que aunque hacemos todo lo posible para estar disponibles para asuntos urgentes fuera del horario de [place], no estamos disponibles las 24 horas del da, los 7 [address]  [address] de la [place].   Si tiene un problema urgente y no puede comunicarse con nosotros, puede optar por buscar atencin mdica  en el consultorio de su doctor(a), en una clnica privada, en un centro de atencin urgente o en una sala de emergencias.  Si tiene Engineer, drilling, por favor llame inmediatamente al 911 o vaya a la sala de emergencias.  Nmeros de bper  - Dr. Gwen Pounds: [phone]  - Dra. Roseanne Reno: [phone]  - Dr. Katrinka Blazing: [phone]   En caso de inclemencias del tiempo, por favor llame a Lacy Duverney principal al [phone] para una actualizacin sobre el [place] de cualquier retraso o cierre.  Consejos para la medicacin en dermatologa: Por  favor, guarde las cajas en las que vienen los medicamentos de uso tpico para ayudarle a seguir las instrucciones sobre dnde y cmo usarlos. Las farmacias generalmente imprimen las instrucciones del medicamento slo en las cajas y no directamente en los tubos del [place].   Si su medicamento es muy caro, por favor, pngase en contacto con Rolm Gala llamando al [phone] y presione la opcin 4 o envenos un mensaje a travs de Clinical cytogeneticist.   No podemos decirle cul ser su copago por los medicamentos por adelantado ya que esto es diferente dependiendo de la cobertura de su seguro. Sin embargo, es posible que podamos encontrar un medicamento sustituto a Audiological scientist un formulario para que el seguro cubra el medicamento que se considera necesario.   Si se requiere una autorizacin previa para que su compaa de seguros [place] su medicamento, por favor permtanos de 1 a 2 das hbiles para completar [address]  Los precios de los medicamentos varan con frecuencia dependiendo  del Environmental consultant de dnde se surte la receta y alguna farmacias pueden ofrecer precios ms baratos.  El sitio web www.goodrx.com tiene cupones para medicamentos de Health and safety inspector. Los precios aqu no tienen en cuenta lo que podra costar con la ayuda del seguro (puede ser ms barato con su seguro), pero el sitio web puede darle el precio si no utiliz Tourist information centre manager.  - Puede imprimir el cupn correspondiente y llevarlo con su receta a la farmacia.  - Tambin puede pasar por nuestra oficina durante el horario de atencin regular y Education officer, museum una tarjeta de cupones de GoodRx.  - Si necesita que su receta se enve electrnicamente a una farmacia diferente, informe a nuestra oficina a travs de MyChart de [place] o por telfono llamando al [phone] y presione la opcin 4.

## 2023-06-25 ENCOUNTER — Ambulatory Visit (INDEPENDENT_AMBULATORY_CARE_PROVIDER_SITE_OTHER): Payer: Commercial Managed Care - PPO | Admitting: *Deleted

## 2023-06-25 DIAGNOSIS — J309 Allergic rhinitis, unspecified: Secondary | ICD-10-CM | POA: Diagnosis not present

## 2023-06-25 LAB — SURGICAL PATHOLOGY

## 2023-06-25 NOTE — Progress Notes (Signed)
Hi Emily [name], the Xray results of your wrist show no evidence of fracture, dislocation, or arthritis. Thank you for allowing me to participate in your care.

## 2023-06-26 ENCOUNTER — Telehealth: Payer: Self-pay

## 2023-06-26 NOTE — Telephone Encounter (Signed)
Patient informed of pathology results 

## 2023-06-26 NOTE — Telephone Encounter (Signed)
-----   Message from Armida Sans sent at 06/26/2023 12:09 PM EDT ----- FINAL DIAGNOSIS        1. Skin, right chest :       MELANOCYTIC NEVUS, COMPOUND TYPE, BASE INVOLVED        2. Skin, left lateral foot :       DYSPLASTIC COMPOUND NEVUS WITH MODERATE ATYPIA, CLOSE TO MARGIN   1- benign mole No further treatment needed 2- Moderate dysplastic Recheck next visit

## 2023-06-28 ENCOUNTER — Ambulatory Visit (INDEPENDENT_AMBULATORY_CARE_PROVIDER_SITE_OTHER): Payer: Commercial Managed Care - PPO | Admitting: Podiatry

## 2023-06-28 ENCOUNTER — Encounter: Payer: Self-pay | Admitting: Podiatry

## 2023-06-28 ENCOUNTER — Ambulatory Visit (INDEPENDENT_AMBULATORY_CARE_PROVIDER_SITE_OTHER): Payer: Commercial Managed Care - PPO

## 2023-06-28 VITALS — BP 110/64 | HR 89 | Temp 97.3°F | Resp 18 | Ht 68.0 in | Wt 156.0 lb

## 2023-06-28 DIAGNOSIS — M722 Plantar fascial fibromatosis: Secondary | ICD-10-CM

## 2023-06-28 DIAGNOSIS — M205X1 Other deformities of toe(s) (acquired), right foot: Secondary | ICD-10-CM

## 2023-06-28 DIAGNOSIS — M7751 Other enthesopathy of right foot: Secondary | ICD-10-CM
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
## 2023-07-01 NOTE — Progress Notes (Unsigned)
LMP 06/01/2015 (Within Days)    Subjective:    Patient ID: Emily [name], female    DOB: February 29, 1981, 42 y.o.   MRN: [id number]  HPI: Emily [name] is a 42 y.o. female  No chief complaint on file.  MOOD Patient states she feels like her mood has been good.  She is getting the wellbutrin manufacturer was changed and preferred the other dose.  But feels like she is tolerating it well.  She has been seeing [facility] who believes she has nerve damage in her foot.  She was started on lyrica which is helping but still has some pain.    Flowsheet Row Office Visit from 06/10/2023 in [facility]  PHQ-9 Total Score 1         06/10/2023   10:34 AM 12/31/2022    9:18 AM 09/27/2022    2:54 PM 06/25/2022    9:24 AM  GAD 7 : Generalized Anxiety Score  Nervous, Anxious, on Edge 0 0 0 0  Control/stop worrying 0 0 0 0  Worry too much - different things 0 0 0 0  Trouble relaxing 0 1 0 1  Restless 0 1 0 0  Easily annoyed or irritable 1 1 0 1  Afraid - awful might happen 0 0 0 0  Total GAD 7 Score 1 3 0 2  Anxiety Difficulty Not difficult at all Not difficult at all Not difficult at all Somewhat difficult    POST GASTRIC SLEEVE   Relevant past medical, surgical, family and social history reviewed and updated as indicated. Interim medical history since our last visit reviewed. Allergies and medications reviewed and updated.  Review of Systems  Psychiatric/Behavioral:  Positive for dysphoric mood. Negative for suicidal ideas. The patient is nervous/anxious.     Per HPI unless specifically indicated above     Objective:    LMP 06/01/2015 (Within Days)   Wt Readings from Last 3 Encounters:  06/28/23 156 lb (70.8 kg)  06/10/23 156 lb (70.8 kg)  05/13/23 162 lb 8 oz (73.7 kg)    Physical Exam Vitals and nursing note reviewed.  Constitutional:      General: She is not in acute distress.    Appearance: Normal appearance. She is obese. She is not ill-appearing,  toxic-appearing or diaphoretic.  HENT:     Head: Normocephalic.     Right Ear: External ear normal.     Left Ear: External ear normal.     Nose: Nose normal.     Mouth/Throat:     Mouth: Mucous membranes are moist.     Pharynx: Oropharynx is clear.  Eyes:     General:        Right eye: No discharge.        Left eye: No discharge.     Extraocular Movements: Extraocular movements intact.     Conjunctiva/sclera: Conjunctivae normal.     Pupils: Pupils are equal, round, and reactive to light.  Cardiovascular:     Rate and Rhythm: Normal rate and regular rhythm.     Heart sounds: No murmur heard. Pulmonary:     Effort: Pulmonary effort is normal. No respiratory distress.     Breath sounds: Normal breath sounds. No wheezing or rales.  Musculoskeletal:     Cervical back: Normal range of motion and neck supple.  Skin:    General: Skin is warm and dry.     Capillary Refill: Capillary refill takes less than 2 seconds.  Neurological:     General: No focal deficit present.     Mental Status: She is alert and oriented to person, place, and time. Mental status is at baseline.  Psychiatric:        Mood and Affect: Mood normal.        Behavior: Behavior normal.        Thought Content: Thought content normal.        Judgment: Judgment normal.     Results for orders placed or performed in visit on 06/20/23  Surgical pathology  Result Value Ref Range   SURGICAL PATHOLOGY      SURGICAL PATHOLOGY [facility] [address] [place], [place] [address] Telephone [phone] or [phone] Fax [phone]  REPORT OF DERMATOPATHOLOGY   Accession #: [id number] Patient Name: TERESSIA, JASPERSON Visit # : [id number]  MRN: [id number] Cytotechnologist: Munoz-Bishop Md, Efraim Kaufmann, Dermatopathologist, Electronic Signature DOB/Age 08/10/1981 (Age: 34) Gender: F Collected Date: 06/20/2023 Received Date: 06/20/2023  FINAL DIAGNOSIS       1. Skin, right chest :        MELANOCYTIC NEVUS, COMPOUND TYPE, BASE INVOLVED       2. Skin, left lateral foot :       DYSPLASTIC COMPOUND NEVUS WITH MODERATE ATYPIA, CLOSE TO MARGIN       ELECTRONIC SIGNATURE : Munoz-Bishop Md, Melissa, Dermatopathologist, Electronic Signature  MICROSCOPIC DESCRIPTION 1. There is a proliferation of banal melanocytes arranged predominantly as nests at the dermal epidermal junction as well as in the dermis.There is no atypia.These changes extend to  the base of the biopsy. 2. There is a proliferation of enlarged melanocytes, showing moderate atypia, arranged mostly as nests at the dermoepidermal junction and in the dermis.There is bridging of rete ridges by nests and some fibroplasia in the papillary dermis.These are the findings of a dysplastic nevus with moderate atypia.This lesion has changes correlating with a lesion arising on an acral location and may account for some of the changes.After H and E review, a MITF Stain was performed to highlight the melanocytes in this lesion.Controls stained appropriately.There is no immunoreactivity for PRAME.The lesion extends close to, but does not clearly involve, a surgical margin.  CASE COMMENTS STAINS USED IN DIAGNOSIS: H&E H&E Universal Negative Control-Bond Stains used in diagnosis 2 *MITF (Red), 2 *PRAME Some of these immunohistochemical stains may have been developed and the performance characteristics determined by [facility]  Some may not have b een cleared or approved by the U.S. Food and Drug Administration.  The FDA has determined that such clearance or approval is not necessary.  This test is used for clinical purposes.  It should not be regarded as investigational or for research.  This laboratory is certified under the Clinical Laboratory Improvement Amendments of 1988 (CLIA-88) as qualified to perform high complexity clinical laboratory testing.    CLINICAL HISTORY  SPECIMEN(S) OBTAINED 1.  Skin, Right Chest 2. Skin, Left Lateral Foot  SPECIMEN COMMENTS: 1. 0.7 x 0.4 cm, light to medium brown irregular papule, check margins 2. 0.6 cm brown macule, check margins SPECIMEN CLINICAL INFORMATION: 1. Neoplasm of skin, R/O dysplastic nevus 2. Neoplasm of skin, R/O dysplastic nevus    Gross Description 1. Formalin fixed specimen received:  6 X 6 X 1 MM, TOTO (2 P) (1 B) ( hwc ) margins inked. 2. Formalin fixed specimen received:  8 X 7 X 1 MM, TOTO (3 P) (1 B) ( hwc ) margins inked.  Rep ort signed out from the following location(s) [place] [address], [place] [address] CLIA #: [id number]  [facility] [address] [place], [place] [address] CLIA #: [id number]       Assessment & Plan:   Problem List Items Addressed This Visit   None     Follow up plan: No follow-ups on file.

## 2023-07-02 ENCOUNTER — Ambulatory Visit (INDEPENDENT_AMBULATORY_CARE_PROVIDER_SITE_OTHER): Payer: Commercial Managed Care - PPO | Admitting: Nurse Practitioner

## 2023-07-02 VITALS — BP 107/73 | HR 69 | Temp 97.8°F | Wt 149.0 lb

## 2023-07-02 DIAGNOSIS — I2721 Secondary pulmonary arterial hypertension: Secondary | ICD-10-CM | POA: Diagnosis not present

## 2023-07-02 DIAGNOSIS — E538 Deficiency of other specified B group vitamins: Secondary | ICD-10-CM | POA: Diagnosis not present

## 2023-07-02 DIAGNOSIS — E559 Vitamin D deficiency, unspecified: Secondary | ICD-10-CM

## 2023-07-02 DIAGNOSIS — F3170 Bipolar disorder, currently in remission, most recent episode unspecified: Secondary | ICD-10-CM

## 2023-07-02 DIAGNOSIS — M79641 Pain in right hand: Secondary | ICD-10-CM

## 2023-07-02 DIAGNOSIS — Z9884 Bariatric surgery status: Secondary | ICD-10-CM

## 2023-07-02 MED ORDER — BUPROPION HCL ER (XL) 300 MG PO TB24: 300.0000 mg | ORAL_TABLET | Freq: Every day | ORAL | 1 refills | Status: DC

## 2023-07-02 NOTE — Assessment & Plan Note (Signed)
Chronic.  Controlled.  Continue with current medication regimen of Wellbutrin daily.  Labs ordered today.  Return to clinic in 6 months for reevaluation.  Call sooner if concerns arise.

## 2023-07-02 NOTE — Assessment & Plan Note (Signed)
S/p gastric sleeve November 2023.  Labs ordered at visit today.  Will make recommendations based on lab results.

## 2023-07-02 NOTE — Assessment & Plan Note (Signed)
Chronic.  Controlled.  Continue with current medication regimen.  Labs ordered today.  Return to clinic in 6 months for reevaluation.  Call sooner if concerns arise.  ? ?

## 2023-07-03 LAB — CBC WITH DIFFERENTIAL/PLATELET
Basophils Absolute: 0 10*3/uL (ref 0.0–0.2)
Basos: 1 %
EOS (ABSOLUTE): 0 10*3/uL (ref 0.0–0.4)
Eos: 1 %
Hematocrit: 43.3 % (ref 34.0–46.6)
Hemoglobin: 14.2 g/dL (ref 11.1–15.9)
Immature Grans (Abs): 0 10*3/uL (ref 0.0–0.1)
Immature Granulocytes: 0 %
Lymphocytes Absolute: 2.6 10*3/uL (ref 0.7–3.1)
Lymphs: 46 %
MCH: 32.3 pg (ref 26.6–33.0)
MCHC: 32.8 g/dL (ref 31.5–35.7)
MCV: 98 fL — ABNORMAL HIGH (ref 79–97)
Monocytes Absolute: 0.5 10*3/uL (ref 0.1–0.9)
Monocytes: 8 %
Neutrophils Absolute: 2.5 10*3/uL (ref 1.4–7.0)
Neutrophils: 44 %
Platelets: 210 10*3/uL (ref 150–450)
RBC: 4.4 x10E6/uL (ref 3.77–5.28)
RDW: 11.9 % (ref 11.7–15.4)
WBC: 5.6 10*3/uL (ref 3.4–10.8)

## 2023-07-03 LAB — IRON,TIBC AND FERRITIN PANEL
Ferritin: 250 ng/mL — ABNORMAL HIGH (ref 15–150)
Iron Saturation: 34 % (ref 15–55)
Iron: 83 ug/dL (ref 27–159)
Total Iron Binding Capacity: 247 ug/dL — ABNORMAL LOW (ref 250–450)
UIBC: 164 ug/dL (ref 131–425)

## 2023-07-03 LAB — VITAMIN D 25 HYDROXY (VIT D DEFICIENCY, FRACTURES): Vit D, 25-Hydroxy: 78.3 ng/mL (ref 30.0–100.0)

## 2023-07-03 LAB — COMPREHENSIVE METABOLIC PANEL
ALT: 11 [IU]/L (ref 0–32)
AST: 16 [IU]/L (ref 0–40)
Albumin: 4.8 g/dL (ref 3.9–4.9)
Alkaline Phosphatase: 84 [IU]/L (ref 44–121)
BUN/Creatinine Ratio: 15 (ref 9–23)
BUN: 13 mg/dL (ref 6–24)
Bilirubin Total: 0.9 mg/dL (ref 0.0–1.2)
CO2: 23 mmol/L (ref 20–29)
Calcium: 10.1 mg/dL (ref 8.7–10.2)
Chloride: 101 mmol/L (ref 96–106)
Creatinine, Ser: 0.84 mg/dL (ref 0.57–1.00)
Globulin, Total: 2.4 g/dL (ref 1.5–4.5)
Glucose: 69 mg/dL — ABNORMAL LOW (ref 70–99)
Potassium: 4.6 mmol/L (ref 3.5–5.2)
Sodium: 142 mmol/L (ref 134–144)
Total Protein: 7.2 g/dL (ref 6.0–8.5)
eGFR: 89 mL/min/{1.73_m2} (ref >59)

## 2023-07-03 LAB — VITAMIN B12: Vitamin B-12: 561 pg/mL (ref 232–1245)

## 2023-07-03 NOTE — Progress Notes (Signed)
Hi Emily [name]. It was nice to see you yesterday.  Your lab work looks good.  Your total iron binding is slightly low.  No need for supplement at this time but we will keep an eye on this moving forward.  No concerns at this time. Continue with your current medication regimen.  Follow up as discussed.  Please let me know if you have any questions.

## 2023-07-05 ENCOUNTER — Encounter: Payer: Self-pay | Admitting: Podiatry

## 2023-07-08 ENCOUNTER — Ambulatory Visit (INDEPENDENT_AMBULATORY_CARE_PROVIDER_SITE_OTHER): Payer: Commercial Managed Care - PPO

## 2023-07-08 DIAGNOSIS — J309 Allergic rhinitis, unspecified: Secondary | ICD-10-CM | POA: Diagnosis not present

## 2023-07-11 ENCOUNTER — Telehealth: Payer: Self-pay | Admitting: Podiatry

## 2023-07-11 NOTE — Telephone Encounter (Signed)
DOS 08/08/2023  CHEILECTOMY RT- [id number] ENDOSCOPIC PLANTAR FASCIOTOMY [id number]  UMR EFFECTIVE DATE- 09/24/2016  COINSURANCE - 10%  PER INCOMING FAX FROM NICOLE A. WITH  [facility], PRIOR AUTH HAS BEEN APPROVED FOR CPT CODES 53664 AND [phone]. GOOD FROM 08/08/2023 - 10/08/2023.  AUTH REFERENCE NUMBER: [id number]  CALL REFERENCE NUMBER: [id number]

## 2023-07-19 ENCOUNTER — Encounter: Payer: Commercial Managed Care - PPO | Admitting: Podiatry

## 2023-07-22 ENCOUNTER — Ambulatory Visit (INDEPENDENT_AMBULATORY_CARE_PROVIDER_SITE_OTHER): Payer: Commercial Managed Care - PPO

## 2023-07-22 DIAGNOSIS — J309 Allergic rhinitis, unspecified: Secondary | ICD-10-CM

## 2023-07-23 ENCOUNTER — Encounter: Payer: Commercial Managed Care - PPO | Admitting: Podiatry

## 2023-07-29 ENCOUNTER — Ambulatory Visit (INDEPENDENT_AMBULATORY_CARE_PROVIDER_SITE_OTHER): Payer: Commercial Managed Care - PPO

## 2023-07-29 DIAGNOSIS — J309 Allergic rhinitis, unspecified: Secondary | ICD-10-CM

## 2023-08-06 ENCOUNTER — Ambulatory Visit (INDEPENDENT_AMBULATORY_CARE_PROVIDER_SITE_OTHER): Payer: Commercial Managed Care - PPO | Admitting: *Deleted

## 2023-08-06 ENCOUNTER — Encounter: Payer: Commercial Managed Care - PPO | Admitting: Podiatry

## 2023-08-06 DIAGNOSIS — J309 Allergic rhinitis, unspecified: Secondary | ICD-10-CM | POA: Diagnosis not present

## 2023-08-08 ENCOUNTER — Other Ambulatory Visit: Payer: Self-pay | Admitting: Podiatry

## 2023-08-08 DIAGNOSIS — M722 Plantar fascial fibromatosis: Secondary | ICD-10-CM | POA: Diagnosis not present

## 2023-08-08 DIAGNOSIS — M2011 Hallux valgus (acquired), right foot: Secondary | ICD-10-CM | POA: Diagnosis not present

## 2023-08-08 MED ORDER — TRAMADOL HCL 50 MG PO TABS: 50.0000 mg | ORAL_TABLET | ORAL | 0 refills | Status: DC | PRN

## 2023-08-08 NOTE — Progress Notes (Signed)
PRN postop. Patient known to tolerate Tramadol.   Felecia Shelling, DPM [facility]  Dr. Felecia Shelling, DPM    [address] [place], [place] [address]                Office [phone]  Fax [phone]

## 2023-08-09 ENCOUNTER — Encounter: Payer: Self-pay | Admitting: Podiatry

## 2023-08-09 ENCOUNTER — Telehealth: Payer: Self-pay | Admitting: Podiatry

## 2023-08-09 ENCOUNTER — Other Ambulatory Visit: Payer: Self-pay | Admitting: Podiatry

## 2023-08-09 MED ORDER — OXYCODONE-ACETAMINOPHEN 5-325 MG PO TABS: 1.0000 | ORAL_TABLET | ORAL | 0 refills | Status: DC | PRN

## 2023-08-09 MED ORDER — HYDROMORPHONE HCL 4 MG PO TABS: 4.0000 mg | ORAL_TABLET | ORAL | 0 refills | Status: DC | PRN

## 2023-08-09 NOTE — Telephone Encounter (Signed)
Pt called and had surgery yesterday and Dr Logan Bores told her if the tramadol was not strong enough to let him know and she has sent 2 messages thru my chart. She is in a lot of pain and needs something stronger sent in asap.

## 2023-08-09 NOTE — Progress Notes (Signed)
PRN postop. Tramadol not alleviating pain. Patient allergic to opiates. Known to tolerate Dilaudid. Rx Dilaudid 4mg  PO Q4H PRN PAIN.  Notify patient.  Felecia Shelling, DPM [facility]  Dr. Felecia Shelling, DPM    [address] [place], [place] [address]                Office [phone]  Fax [phone]

## 2023-08-16 ENCOUNTER — Telehealth: Payer: Self-pay

## 2023-08-16 ENCOUNTER — Ambulatory Visit (INDEPENDENT_AMBULATORY_CARE_PROVIDER_SITE_OTHER): Payer: Commercial Managed Care - PPO

## 2023-08-16 ENCOUNTER — Ambulatory Visit (INDEPENDENT_AMBULATORY_CARE_PROVIDER_SITE_OTHER): Payer: Commercial Managed Care - PPO | Admitting: Podiatry

## 2023-08-16 ENCOUNTER — Encounter: Payer: Self-pay | Admitting: Podiatry

## 2023-08-16 DIAGNOSIS — Z9889 Other specified postprocedural states: Secondary | ICD-10-CM

## 2023-08-16 DIAGNOSIS — M2011 Hallux valgus (acquired), right foot: Secondary | ICD-10-CM

## 2023-08-16 DIAGNOSIS — M722 Plantar fascial fibromatosis: Secondary | ICD-10-CM

## 2023-08-16 MED ORDER — TRAMADOL HCL 50 MG PO TABS: 50.0000 mg | ORAL_TABLET | Freq: Four times a day (QID) | ORAL | 0 refills | Status: AC | PRN

## 2023-08-16 NOTE — Progress Notes (Signed)
Chief Complaint  Patient presents with   Routine Post Op    POV #1, DOS 08/08/2023, ENDOSCOPIC PLANTAR FASCIOTOMY RIGHT, CHEILECTOMY RIGHT GREAT TOE "It's okay.  It's been a lot more painful since I've been trying to put weight on it. My pain level is about a six right now."    Subjective:  Patient presents today status post cheilectomy right great toe and endoscopic plantar fasciotomy right.  DOS: 08/08/2023.  Patient doing well.  Initially the tramadol was not enough to relieve her pain and a prescription for Dilaudid was sent to the pharmacy.  She is now back to only taking tramadol.  Pain has improved significantly.  Minimally WBAT cam boot  Past Medical History:  Diagnosis Date   Allergy    Anemia    Asthma    sports induced inhalers   Bipolar 1 disorder (HCC)    Conversion disorder    Depression    Dysplastic nevus 04/17/2023   L upper back paraspinal, moderate atypia   Dysplastic nevus 06/20/2023   L lat foot - moderate   History of kidney stones    Insomnia    Migraines    migraines   Morbid obesity (HCC) 07/03/2016   MRSA infection 2010   Ovarian cyst    left 2.5cm   Sleep apnea    has cpap   Vitamin B 12 deficiency    Vitamin D deficiency     Past Surgical History:  Procedure Laterality Date   BARIATRIC SURGERY  08/14/2022   BUNIONECTOMY     ESOPHAGOGASTRODUODENOSCOPY (EGD) WITH PROPOFOL N/A 11/03/2018   Procedure: ESOPHAGOGASTRODUODENOSCOPY (EGD) WITH PROPOFOL;  Surgeon: Wyline Mood, MD;  Location: [facility] ENDOSCOPY;  Service: Gastroenterology;  Laterality: N/A;   HAMMER TOE SURGERY     HAND SURGERY  09/24/2012   cyst removed   INTRAUTERINE DEVICE (IUD) INSERTION N/A 05/09/2015   Procedure: INTRAUTERINE DEVICE (IUD) INSERTION;  Surgeon: Hildred Laser, MD;  Location: [facility] ORS;  Service: Gynecology;  Laterality: N/A;   LAPAROSCOPIC ASSISTED VAGINAL HYSTERECTOMY N/A 06/20/2015   Procedure: LAPAROSCOPIC ASSISTED VAGINAL HYSTERECTOMY, BILATERAL SALPINGECTOMY,  CYSTOSCOPY, IUD REMOVAL ;  Surgeon: Hildred Laser, MD;  Location: [facility] ORS;  Service: Gynecology;  Laterality: N/A;   LAPAROSCOPIC TUBAL LIGATION Bilateral 05/09/2015   Procedure: LAPAROSCOPIC TUBAL LIGATION;  Surgeon: Hildred Laser, MD;  Location: [facility] ORS;  Service: Gynecology;  Laterality: Bilateral;   NASAL SINUS SURGERY     TOTAL ABDOMINAL HYSTERECTOMY W/ BILATERAL SALPINGOOPHORECTOMY     TOTAL ABDOMINAL HYSTERECTOMY W/ BILATERAL SALPINGOOPHORECTOMY     TUBAL LIGATION  09/24/2005   UPPER GI ENDOSCOPY N/A 08/14/2022   Procedure: UPPER GI ENDOSCOPY;  Surgeon: Luretha Murphy, MD;  Location: [facility] ORS;  Service: General;  Laterality: N/A;    Allergies  Allergen Reactions   Codeine Diarrhea and Nausea And Vomiting    Objective/Physical Exam Neurovascular status intact.  Incision well coapted with sutures intact. No sign of infectious process noted. No dehiscence. No active bleeding noted.  No edema noted.  Radiographic Exam RT foot 08/16/2023:  Osteotomies sites appear to be stable with routine healing.  Assessment: 1. s/p cheilectomy + EPF RT. DOS: 08/08/2023   Plan of Care:  -Patient was evaluated. X-rays reviewed - Dressings changed.  Patient may begin washing and showering and getting the foot wet -Recommend triple antibiotic and a Band-Aid to the incision sites. -Ace wrap's dispensed.  Wear daily for compression -Continue minimal WBAT cam boot -Refill prescription for tramadol 50 mg Q4H prn pain -Return  to clinic 2 weeks suture removal   Felecia Shelling, DPM [facility]  Dr. Felecia Shelling, DPM    [address] [place], [place] [address]                Office [phone]  Fax [phone]

## 2023-08-16 NOTE — Telephone Encounter (Signed)
PA request received from covermymeds for Tramadol 50 mg tablet. PA was submitted through covermymeds and approved. Approved from 08/16/23-09/15/23

## 2023-08-19 ENCOUNTER — Encounter: Payer: Commercial Managed Care - PPO | Attending: Surgery | Admitting: Dietician

## 2023-08-19 ENCOUNTER — Encounter: Payer: Self-pay | Admitting: Dietician

## 2023-08-19 DIAGNOSIS — E669 Obesity, unspecified: Secondary | ICD-10-CM | POA: Diagnosis present

## 2023-08-19 NOTE — Progress Notes (Signed)
Bariatric Nutrition Follow-Up Visit Medical Nutrition Therapy   Virtual Visit via Video Note  I connected with Emily [name] on 08/19/23 at 10:30 AM EST by a video enabled visit and verified that I am speaking with the correct person using two identifiers. I discussed the limitations of evaluation and management by telemedicine and the availability of in person appointments. The patient expressed understanding and agreed to proceed.   Surgery date: 08/14/2022 Surgery type: Sleeve Gastrectomy  Anthropometrics  Start weight at [facility]: 252 lbs (date: 09/11/2021)  Height: 68 in Weight today: virtual visit (pt states she got to her goal weight of 145 lbs; pt reports weight today is 143)   Clinical  Medical hx: sleep apnea, bipolar, IBS Medications: see list  Labs:  Notable signs/symptoms: constipation  Any previous deficiencies? Yes; vitamin D, vitamin B12, iron  Bowel Habits: Every day to every other day no complaints   Body Composition Scale 08/28/2022 10/09/2022 01/14/2023 05/13/23  Current Body Weight 248.0 230.8 198.1  162.5 lb  Total Body Fat % 42.4 41.0 35.7 28.3  Visceral Fat 12 11 9 6   Fat-Free Mass % 57.5 58.9 64.2 71.6   Total Body Water % 43.2 43.9 46.6 50.3  Muscle-Mass lbs 35.0 34.2 34.5 34.2  BMI 37.7 35.0 30 24.6  Body Fat Displacement             Torso  lbs 65.3 58.6 43.8 28.4         Left Leg  lbs 13.0 11.7 8.7 5.6         Right Leg  lbs 13.0 11.7 8.7 5.6         Left Arm  lbs 6.5 5.8 4.3 2.8         Right Arm   lbs 6.5 5.8 4.3 2.8     Lifestyle & Dietary Hx  Pt states she has reached her goal weight of 145 lbs., stating she has been that weight for about 1.5 months. Pt states has a dairy allergy, and the calcium supplements upset her stomach. Pt states she had foot surgery, stating she has to have her foot elevated. Pt states she has tried to do some chair exercises some. Pt states she started BELT, but had to stop because of her foot. Pt states she will start  physical activity again when the boot comes off in a couple of weeks. Pt states grains fill her up quickly, stating she can not eat anything else. Pt understands that she will eat her protein first.   Estimated daily fluid intake: 50-60 oz (water) + coffee/tea also Estimated daily protein intake: 70-80 g Supplements: multivitamin and states she cannot do the calcium because it tares her stomach up  Current average weekly physical activity: ADLs and some arm chair exercises (due to her foot and foot surgery).   24-Hr Dietary Recall First Meal: coffee w/almond milk creamer, an egg, [place] sausage  Snack: [place] jerky Second Meal: 1.5 cup of salad greens, 1 tsp cheese, 1 serve chicken or shrimp  Snack: veg and humus Third Meal: salad or [place] burger, or chicken breast and greens  Snack:  Beverages: water, Gatorade zero, diluted juice   Post-Op Goals/ Signs/ Symptoms Using straws: no Drinking while eating: no Chewing/swallowing difficulties: no Changes in vision: no Changes to mood/headaches: no Hair loss/changes to skin/nails: some Difficulty focusing/concentrating: no Sweating: no Limb weakness: no Dizziness/lightheadedness: no Palpitations: no Carbonated/caffeinated beverages: no N/V/D/C/Gas: yes, from newly diagnosed allergies Abdominal pain: no Dumping syndrome: no  NUTRITION DIAGNOSIS  Overweight/obesity (Perkins-3.3) related to past poor dietary habits and physical inactivity as evidenced by completed bariatric surgery and following dietary guidelines for continued weight loss and healthy nutrition status.     NUTRITION INTERVENTION Nutrition counseling (C-1) and education (E-2) to facilitate bariatric surgery goals, including: The importance of consuming adequate calories as well as certain nutrients daily due to the body's need for essential vitamins, minerals, and fats The importance of daily physical activity and to reach a goal of at least 150 minutes of moderate to  vigorous physical activity weekly (or as directed by their physician) due to benefits such as increased musculature and improved lab values The importance of intuitive eating specifically learning hunger-satiety cues and understanding the importance of learning a new body: The importance of mindful eating to avoid grazing behaviors  Educated pt on the importance of incorporating complex carbohydrates and getting a variety of foods.  Reviewed the Maintenance Plan (1 year) handout, and lifetime goals.  Handouts Previously Provided Include  Bariatric MyPlate Maintenance Plan (1 Year)  Learning Style & Readiness for Change Teaching method utilized: Visual & Auditory  Demonstrated degree of understanding via: Teach Back  Readiness Level: Change in progress. Barriers to learning/adherence to lifestyle change: None identified  RD's Notes for Next Visit Assess adherence to pt chosen goals  MONITORING & EVALUATION Dietary intake, weekly physical activity, body weight.  Next Steps Patient is to follow-up in March or April after next blood draw for labs. Pt will call to schedule.

## 2023-08-20 ENCOUNTER — Encounter: Payer: Commercial Managed Care - PPO | Admitting: Podiatry

## 2023-08-30 ENCOUNTER — Encounter: Payer: Self-pay | Admitting: Podiatry

## 2023-08-30 ENCOUNTER — Ambulatory Visit (INDEPENDENT_AMBULATORY_CARE_PROVIDER_SITE_OTHER): Payer: Commercial Managed Care - PPO | Admitting: Podiatry

## 2023-08-30 DIAGNOSIS — M205X1 Other deformities of toe(s) (acquired), right foot: Secondary | ICD-10-CM

## 2023-08-30 NOTE — Progress Notes (Signed)
Chief Complaint  Patient presents with   Routine Post Op    POV #2, DOS 08/08/2023, ENDOSCOPIC PLANTAR FASCIOTOMY RIGHT, CHEILECTOMY RIGHT GREAT TOE "It's doing good.  I still use the crutches.  It feels weird when I step on it a certain way, kind of like a sharp pain.  Plus, I use them for balance."    Subjective:  Patient presents today status post cheilectomy right great toe and endoscopic plantar fasciotomy right.  DOS: 08/08/2023.  WBAT cam boot with the assistance of crutches for stability and balance.  Doing well.  She continues to have some pain to the area but overall improvement  Past Medical History:  Diagnosis Date   Allergy    Anemia    Asthma    sports induced inhalers   Bipolar 1 disorder (HCC)    Conversion disorder    Depression    Dysplastic nevus 04/17/2023   L upper back paraspinal, moderate atypia   Dysplastic nevus 06/20/2023   L lat foot - moderate   History of kidney stones    Insomnia    Migraines    migraines   Morbid obesity (HCC) 07/03/2016   MRSA infection 2010   Ovarian cyst    left 2.5cm   Sleep apnea    has cpap   Vitamin B 12 deficiency    Vitamin D deficiency     Past Surgical History:  Procedure Laterality Date   BARIATRIC SURGERY  08/14/2022   BUNIONECTOMY     ESOPHAGOGASTRODUODENOSCOPY (EGD) WITH PROPOFOL N/A 11/03/2018   Procedure: ESOPHAGOGASTRODUODENOSCOPY (EGD) WITH PROPOFOL;  Surgeon: Wyline Mood, MD;  Location: [facility] ENDOSCOPY;  Service: Gastroenterology;  Laterality: N/A;   HAMMER TOE SURGERY     HAND SURGERY  09/24/2012   cyst removed   INTRAUTERINE DEVICE (IUD) INSERTION N/A 05/09/2015   Procedure: INTRAUTERINE DEVICE (IUD) INSERTION;  Surgeon: Hildred Laser, MD;  Location: [facility] ORS;  Service: Gynecology;  Laterality: N/A;   LAPAROSCOPIC ASSISTED VAGINAL HYSTERECTOMY N/A 06/20/2015   Procedure: LAPAROSCOPIC ASSISTED VAGINAL HYSTERECTOMY, BILATERAL SALPINGECTOMY, CYSTOSCOPY, IUD REMOVAL ;  Surgeon: Hildred Laser, MD;   Location: [facility] ORS;  Service: Gynecology;  Laterality: N/A;   LAPAROSCOPIC TUBAL LIGATION Bilateral 05/09/2015   Procedure: LAPAROSCOPIC TUBAL LIGATION;  Surgeon: Hildred Laser, MD;  Location: [facility] ORS;  Service: Gynecology;  Laterality: Bilateral;   NASAL SINUS SURGERY     TOTAL ABDOMINAL HYSTERECTOMY W/ BILATERAL SALPINGOOPHORECTOMY     TOTAL ABDOMINAL HYSTERECTOMY W/ BILATERAL SALPINGOOPHORECTOMY     TUBAL LIGATION  09/24/2005   UPPER GI ENDOSCOPY N/A 08/14/2022   Procedure: UPPER GI ENDOSCOPY;  Surgeon: Luretha Murphy, MD;  Location: [facility] ORS;  Service: General;  Laterality: N/A;    Allergies  Allergen Reactions   Codeine Diarrhea and Nausea And Vomiting    Objective/Physical Exam Neurovascular status intact.  Incision well coapted with sutures intact. No sign of infectious process noted. No dehiscence. No active bleeding noted.  No edema noted.  Radiographic Exam RT foot 08/16/2023:  Osteotomies sites appear to be stable with routine healing.  Assessment: 1. s/p cheilectomy + EPF RT. DOS: 08/08/2023   Plan of Care:  -Patient was evaluated.  -Sutures removed -Compression ankle sleeve dispensed.  Wear daily -Patient may begin to transition out of the cam boot into good supportive tennis shoes and sneakers -Return to clinic 4 weeks  Felecia Shelling, DPM [facility]  Dr. Felecia Shelling, DPM    [address]  [place], [place] [address]                Office [phone]  Fax [phone]

## 2023-09-03 ENCOUNTER — Ambulatory Visit (INDEPENDENT_AMBULATORY_CARE_PROVIDER_SITE_OTHER): Payer: Commercial Managed Care - PPO

## 2023-09-03 DIAGNOSIS — J309 Allergic rhinitis, unspecified: Secondary | ICD-10-CM

## 2023-09-06 ENCOUNTER — Encounter: Payer: Commercial Managed Care - PPO | Admitting: Podiatry

## 2023-09-24 ENCOUNTER — Other Ambulatory Visit: Payer: Self-pay | Admitting: Medical Genetics

## 2023-09-26 ENCOUNTER — Other Ambulatory Visit
Admission: RE | Admit: 2023-09-26 | Discharge: 2023-09-26 | Disposition: A | Discharge status: Home / Self Care | Payer: Self-pay | Source: Ambulatory Visit | Attending: Medical Genetics | Admitting: Medical Genetics

## 2023-10-01 ENCOUNTER — Ambulatory Visit (INDEPENDENT_AMBULATORY_CARE_PROVIDER_SITE_OTHER): Payer: Commercial Managed Care - PPO

## 2023-10-01 ENCOUNTER — Encounter: Payer: Self-pay | Admitting: Podiatry

## 2023-10-01 ENCOUNTER — Ambulatory Visit (INDEPENDENT_AMBULATORY_CARE_PROVIDER_SITE_OTHER): Payer: Commercial Managed Care - PPO | Admitting: Podiatry

## 2023-10-01 DIAGNOSIS — Z9889 Other specified postprocedural states: Secondary | ICD-10-CM

## 2023-10-01 NOTE — Progress Notes (Signed)
 Chief Complaint  Patient presents with   Routine Post Op    POV #3, DOS 08/08/2023, ENDOSCOPIC PLANTAR FASCIOTOMY RIGHT, CHEILECTOMY RIGHT GREAT TOE It's doing great.    Subjective:  Patient presents today status post cheilectomy right great toe and endoscopic plantar fasciotomy right.  DOS: 08/08/2023.  Patient very well.  She is ambulating and basically back to full activity.  She does have some slight hypersensitivity to the incision site overlying the great toe but overall she says that she is very satisfied.  She also states that the orthotics that she received prior to surgery seem to apply pressure to the plantar arch differently than prior to surgery.  Past Medical History:  Diagnosis Date   Allergy     Anemia    Asthma    sports induced inhalers   Bipolar 1 disorder (HCC)    Conversion disorder    Depression    Dysplastic nevus 04/17/2023   L upper back paraspinal, moderate atypia   Dysplastic nevus 06/20/2023   L lat foot - moderate   History of kidney stones    Insomnia    Migraines    migraines   Morbid obesity (HCC) 07/03/2016   MRSA infection 2010   Ovarian cyst    left 2.5cm   Sleep apnea    has cpap   Vitamin B 12 deficiency    Vitamin D  deficiency     Past Surgical History:  Procedure Laterality Date   BARIATRIC SURGERY  08/14/2022   BUNIONECTOMY     ESOPHAGOGASTRODUODENOSCOPY (EGD) WITH PROPOFOL  N/A 11/03/2018   Procedure: ESOPHAGOGASTRODUODENOSCOPY (EGD) WITH PROPOFOL ;  Surgeon: Therisa Bi, MD;  Location: [facility] ENDOSCOPY;  Service: Gastroenterology;  Laterality: N/A;   HAMMER TOE SURGERY     HAND SURGERY  09/24/2012   cyst removed   INTRAUTERINE DEVICE (IUD) INSERTION N/A 05/09/2015   Procedure: INTRAUTERINE DEVICE (IUD) INSERTION;  Surgeon: Archie Savers, MD;  Location: [facility] ORS;  Service: Gynecology;  Laterality: N/A;   LAPAROSCOPIC ASSISTED VAGINAL HYSTERECTOMY N/A 06/20/2015   Procedure: LAPAROSCOPIC ASSISTED VAGINAL HYSTERECTOMY, BILATERAL  SALPINGECTOMY, CYSTOSCOPY, IUD REMOVAL ;  Surgeon: Archie Savers, MD;  Location: [facility] ORS;  Service: Gynecology;  Laterality: N/A;   LAPAROSCOPIC TUBAL LIGATION Bilateral 05/09/2015   Procedure: LAPAROSCOPIC TUBAL LIGATION;  Surgeon: Archie Savers, MD;  Location: [facility] ORS;  Service: Gynecology;  Laterality: Bilateral;   NASAL SINUS SURGERY     TOTAL ABDOMINAL HYSTERECTOMY W/ BILATERAL SALPINGOOPHORECTOMY     TOTAL ABDOMINAL HYSTERECTOMY W/ BILATERAL SALPINGOOPHORECTOMY     TUBAL LIGATION  09/24/2005   UPPER GI ENDOSCOPY N/A 08/14/2022   Procedure: UPPER GI ENDOSCOPY;  Surgeon: Gladis Cough, MD;  Location: [facility] ORS;  Service: General;  Laterality: N/A;    Allergies  Allergen Reactions   Codeine Diarrhea and Nausea And Vomiting    Objective/Physical Exam Neurovascular status intact.  Incision nicely healed.  No edema noted.  No pain or sensitivity along the plantar heel.  There is some hypersensitivity with light touch along the incision site overlying the first MTP  Radiographic Exam RT foot 10/01/2023:  Osteotomies sites appear to be stable with routine healing.  Assessment: 1. s/p cheilectomy + EPF RT. DOS: 08/08/2023   Plan of Care:  -Patient was evaluated.  - Patient has some slight hypersensitivity with light touch along the incision site.  Recommend OTC topical Voltaren gel as needed -Patient also states that her orthotics that she had dispensed prior to surgery seem to irritate her arch now postoperatively.  Appointment  with orthotics department for orthotics adjustment -From a surgical standpoint patient may resume full activity with no restrictions -Return to clinic as needed  Thresa EMERSON Sar, DPM [facility]  Dr. Thresa EMERSON Sar, DPM    [address] [place], [place] [address]                Office [phone]  Fax [phone]

## 2023-10-07 ENCOUNTER — Other Ambulatory Visit: Payer: Self-pay

## 2023-10-07 ENCOUNTER — Encounter: Payer: Self-pay | Admitting: Internal Medicine

## 2023-10-07 ENCOUNTER — Ambulatory Visit (INDEPENDENT_AMBULATORY_CARE_PROVIDER_SITE_OTHER): Payer: Commercial Managed Care - PPO

## 2023-10-07 ENCOUNTER — Ambulatory Visit (INDEPENDENT_AMBULATORY_CARE_PROVIDER_SITE_OTHER): Payer: Commercial Managed Care - PPO | Admitting: Internal Medicine

## 2023-10-07 VITALS — BP 102/70 | HR 87 | Temp 98.2°F | Resp 18 | Ht 68.75 in | Wt 139.5 lb

## 2023-10-07 DIAGNOSIS — J3089 Other allergic rhinitis: Secondary | ICD-10-CM | POA: Diagnosis not present

## 2023-10-07 DIAGNOSIS — J309 Allergic rhinitis, unspecified: Secondary | ICD-10-CM | POA: Diagnosis not present

## 2023-10-07 DIAGNOSIS — J4520 Mild intermittent asthma, uncomplicated: Secondary | ICD-10-CM

## 2023-10-07 DIAGNOSIS — T7800XA Anaphylactic reaction due to unspecified food, initial encounter: Secondary | ICD-10-CM

## 2023-10-07 DIAGNOSIS — J302 Other seasonal allergic rhinitis: Secondary | ICD-10-CM

## 2023-10-07 DIAGNOSIS — T781XXD Other adverse food reactions, not elsewhere classified, subsequent encounter: Secondary | ICD-10-CM

## 2023-10-07 DIAGNOSIS — T781XXA Other adverse food reactions, not elsewhere classified, initial encounter: Secondary | ICD-10-CM

## 2023-10-07 DIAGNOSIS — L2089 Other atopic dermatitis: Secondary | ICD-10-CM | POA: Diagnosis not present

## 2023-10-07 MED ORDER — EPINEPHRINE 0.3 MG/0.3ML IJ SOAJ: 0.3000 mg | INTRAMUSCULAR | 2 refills | Status: DC | PRN

## 2023-10-07 MED ORDER — ALBUTEROL SULFATE (2.5 MG/3ML) 0.083% IN NEBU: 2.5000 mg | INHALATION_SOLUTION | RESPIRATORY_TRACT | 1 refills | Status: DC | PRN

## 2023-10-07 MED ORDER — ALBUTEROL SULFATE HFA 108 (90 BASE) MCG/ACT IN AERS: 1.0000 | INHALATION_SPRAY | Freq: Four times a day (QID) | RESPIRATORY_TRACT | 1 refills | Status: DC | PRN

## 2023-10-07 NOTE — Patient Instructions (Addendum)
 Asthma-mild intermittent Continue albuterol  once every 4 hours as needed for cough or wheeze You may use albuterol  2 puffs 5-15 minutes before activity to decrease cough or wheeze For asthma flare, begin Pulmicort  0.5 mg via nebulizer twice a day to prevent cough or wheeze. You may stop in 2 weeks or until cough and wheeze free.   Allergic rhinitis Continue allergen avoidance measures directed toward pollen, mold, pet, and dust mite as listed below Continue Flonase  nasal spray 2 sprays in each nostril once a day as needed for stuffy nose Continue ipratropium nasal spray 2 sprays in each nostril twice a day as needed for nasal symptoms Consider saline nasal rinses as needed for nasal symptoms. Use this before any medicated nasal sprays for best result Continue allergen immunotherapy per protocol and have access to an epinephrine  autoinjector set  Pruritus-resolved You can begin an antihistamine once a day as needed for itch. Some examples of over the counter antihistamines include Zyrtec (cetirizine), Xyzal (levocetirizine), Allegra (fexofenadine), and Claritin (loratidine).   Atopic dermatitis-at goal Continue a daily moisturizer twice a day For red and itchy areas below your face, begin triamcinolone  0.1% ointment up to twice a day as needed. Do not use this medication longer than 2 weeks in a row  Food allergy  Continue to avoid lamb, beef, pork, dairy products, artificial sweeteners, and agave.  In case of an allergic reaction, take Benadryl  50 mg every 4 hours, and if life-threatening symptoms occur, inject with EpiPen  0.3 mg. If your symptoms re-occur, begin a journal of events that occurred for up to 6 hours before your symptoms began including symptoms you experienced,foods and beverages consumed, and medications taken. A lab has been ordered to evaluate for alpha gal allergy .  Call the clinic if this treatment plan is not working well for you.  Follow up in 6 months or sooner if  needed.  Reducing Pollen Exposure The American Academy of Allergy , Asthma and Immunology suggests the following steps to reduce your exposure to pollen during allergy  seasons. Do not hang sheets or clothing out to dry; pollen may collect on these items. Do not mow lawns or spend time around freshly cut grass; mowing stirs up pollen. Keep windows closed at night.  Keep car windows closed while driving. Minimize morning activities outdoors, a time when pollen counts are usually at their highest. Stay indoors as much as possible when pollen counts or humidity is high and on windy days when pollen tends to remain in the air longer. Use air conditioning when possible.  Many air conditioners have filters that trap the pollen spores. Use a HEPA room air filter to remove pollen form the indoor air you breathe.  Control of Dog or Cat Allergen Avoidance is the best way to manage a dog or cat allergy . If you have a dog or cat and are allergic to dog or cats, consider removing the dog or cat from the home. If you have a dog or cat but don't want to find it a new home, or if your family wants a pet even though someone in the household is allergic, here are some strategies that may help keep symptoms at bay:  Keep the pet out of your bedroom and restrict it to only a few rooms. Be advised that keeping the dog or cat in only one room will not limit the allergens to that room. Don't pet, hug or kiss the dog or cat; if you do, wash your hands with soap and water. High-efficiency  particulate air (HEPA) cleaners run continuously in a bedroom or living room can reduce allergen levels over time. Regular use of a high-efficiency vacuum cleaner or a central vacuum can reduce allergen levels. Giving your dog or cat a bath at least once a week can reduce airborne allergen.   Control of Dust Mite Allergen Dust mites play a major role in allergic asthma and rhinitis. They occur in environments with high humidity  wherever human skin is found. Dust mites absorb humidity from the atmosphere (ie, they do not drink) and feed on organic matter (including shed human and animal skin). Dust mites are a microscopic type of insect that you cannot see with the naked eye. High levels of dust mites have been detected from mattresses, pillows, carpets, upholstered furniture, bed covers, clothes, soft toys and any woven material. The principal allergen of the dust mite is found in its feces. A gram of dust may contain 1,000 mites and 250,000 fecal particles. Mite antigen is easily measured in the air during house cleaning activities. Dust mites do not bite and do not cause harm to humans, other than by triggering allergies/asthma.  Ways to decrease your exposure to dust mites in your home:  1. Encase mattresses, box springs and pillows with a mite-impermeable barrier or cover  2. Wash sheets, blankets and drapes weekly in hot water (130 F) with detergent and dry them in a dryer on the hot setting.  3. Have the room cleaned frequently with a vacuum cleaner and a damp dust-mop. For carpeting or rugs, vacuuming with a vacuum cleaner equipped with a high-efficiency particulate air (HEPA) filter. The dust mite allergic individual should not be in a room which is being cleaned and should wait 1 hour after cleaning before going into the room.  4. Do not sleep on upholstered furniture (eg, couches).  5. If possible removing carpeting, upholstered furniture and drapery from the home is ideal. Horizontal blinds should be eliminated in the rooms where the person spends the most time (bedroom, study, television room). Washable vinyl, roller-type shades are optimal.  6. Remove all non-washable stuffed toys from the bedroom. Wash stuffed toys weekly like sheets and blankets above.  7. Reduce indoor humidity to less than 50%. Inexpensive humidity monitors can be purchased at most hardware stores. Do not use a humidifier as can make the  problem worse and are not recommended.  Control of Mold Allergen Mold and fungi can grow on a variety of surfaces provided certain temperature and moisture conditions exist.  Outdoor molds grow on plants, decaying vegetation and soil.  The major outdoor mold, Alternaria and Cladosporium, are found in very high numbers during hot and dry conditions.  Generally, a late Summer - Fall peak is seen for common outdoor fungal spores.  Rain will temporarily lower outdoor mold spore count, but counts rise rapidly when the rainy period ends.  The most important indoor molds are Aspergillus and Penicillium.  Dark, humid and poorly ventilated basements are ideal sites for mold growth.  The next most common sites of mold growth are the bathroom and the kitchen.  Outdoor [employer] Use air conditioning and keep windows closed Avoid exposure to decaying vegetation. Avoid leaf raking. Avoid grain handling. Consider wearing a face mask if working in moldy areas.  Indoor Mold Control Maintain humidity below 50%. Clean washable surfaces with 5% bleach solution. Remove sources e.g. Contaminated carpets.

## 2023-10-07 NOTE — Progress Notes (Signed)
 FOLLOW UP Date of Service/Encounter:  10/07/23  Subjective:  Emily [name] (DOB: 1980/10/26) is a 43 y.o. female who [facility] in re-evaluation of the following: asthma, allergic rhinitis on AIT, alpha-gal allergy , multiple food intolerances, atopic dermatitis History obtained from: chart review and patient.  For Review, LV was on 04/04/23  with Arlean Mutter, FNP seen for routine follow-up. See below for summary of history and diagnostics.  Therapeutic plans/changes recommended: continued on AIT, albuterol  PRN, pulmicort  for flares, triamcinolone  for rash. FEV1 91% ----------------------------------------------------- Pertinent History/Diagnostics:  Asthma: Initial visit 04/09/22: intially seen for chronic cough and SOB, worse after having COVID twice, following with pulmonary who sent for concern for UACS.  Responds to albuterol  and has hx of exercise induced asthma, but new symptoms worsening since Covid. Unable to tolerate ICS due to irritation of throat and weight gain.   S/p Bariatric surgery 07/2022.  - normal cardiac eval. Last echo 08/04/21  - normal spirometry (04/04/23): 91% FEV1 Allergic Rhinitis:  Uses flonase , can not tolerate AH due to perceived weight gain.  - SPT environmental panel (04/09/22): negative on skin, + intradermals to [place], major and minor indoor and outdoor molds, cat, dog, and dust mites  - AIT started 04/09/22: receives 2 vials: (vial 1: C/D/DM/G and vial 2: molds) Food Allergy :  agave makes her vomit Seafood-both fish and shellfish, makes her stomach upset and feels lethargic or blah, no vomiting-occurs within 30 minutes of ingestion. She can eat, but smell makes her nauseous when being cooked. Lamb-upset stomach, nausea, within 15-20 minutes of eating Artificial sweeteners in general make her sick to her stomach. - SPT select foods (04/09/22): negative to top 8 most common allergens, fish, shellfish and  lamb -sIgE labs 10/04/2022 positive to milk, pork, lamb, and beef  - alpha gal 04/26/23: positive 1.44-avoiding artificial sweeteners and alpha-gal products Eczema: Occasional rash when exposed to pet danders.   Has triamcinolone  to use PRN Other: OSA with CPAP but unable to use due to coughing fits. S/P bariatric surgery 2023. --------------------------------------------------- Today presents for follow-up. Discussed the use of AI scribe software for clinical note transcription with the patient, who gave verbal consent to proceed.  History of Present Illness   The patient, diagnosed with alpha-gal allergy , has been successfully avoiding the allergenic foods, including beef, for a couple of years. She reports that her stomach issues, previously triggered by consuming beef, have resolved since the dietary changes. She has also been careful to avoid gelatin capsules, opting for vegetarian alternatives. She reports an intolerance to stevia, which she also avoids.  The patient has a history of respiratory issues, including a recurring cough. The cough has improved significantly, attributed to the administration of allergy  shots. She has an albuterol  inhaler and nebulizer for flare-ups, but the frequency of use has decreased over the past few months. The last use of the inhaler was two weeks ago, triggered by inhaling dog fur.  She also reports issues with her nose occasionally, for which she takes Flonase . However, she cannot use it long-term due to side effects. She is currently not on any other allergy  medications.  The patient has a history of skin itching, which has resolved, and a rash triggered by contact with dogs and cats. She has a topical steroid for potential future breakouts. She also reports an inability to cook fish at home due to nausea and vomiting triggered by the smell, despite being able to consume it without issues.  Chart Review: Last injection-09/03/23 0.5 mL of red vials  (vial 1: C/D/DM/G and vial 2: molds)  All medications reviewed by clinical staff and updated in chart. No new pertinent medical or surgical history except as noted in HPI.  ROS: All others negative except as noted per HPI.   Objective:  BP 102/70 (BP Location: Right Arm, Patient Position: Sitting, Cuff Size: Normal)   Pulse 87   Temp 98.2 F (36.8 C) (Temporal)   Resp 18   Ht 5' 8.75 (1.746 m)   Wt 139 lb 8 oz (63.3 kg)   LMP 06/01/2015 (Within Days)   SpO2 100%   BMI 20.75 kg/m  Body mass index is 20.75 kg/m. Physical Exam: General Appearance:  Alert, cooperative, no distress, appears stated age  Head:  Normocephalic, without obvious abnormality, atraumatic  Eyes:  Conjunctiva clear, EOM's intact  Ears EACs normal bilaterally and normal TMs bilaterally  Nose: Nares normal, hypertrophic turbinates, normal mucosa, and no visible anterior polyps  Throat: Lips, tongue normal; teeth and gums normal, normal posterior oropharynx  Neck: Supple, symmetrical  Lungs:   clear to auscultation bilaterally, Respirations unlabored, no coughing  Heart:  regular rate and rhythm and no murmur, Appears well perfused  Extremities: No edema  Skin: Skin color, texture, turgor normal and no rashes or lesions on visualized portions of skin  Neurologic: No gross deficits   Labs:  Lab Orders         Alpha-Gal Panel      Spirometry:  Tracings reviewed. Her effort: Good reproducible efforts. FVC: 3.94L FEV1: 3.20L, 95% predicted FEV1/FVC ratio: 0.81 Interpretation: Spirometry consistent with normal pattern.  Please see scanned spirometry results for details.  Assessment/Plan   Asthma-mild intermittent; at goal Continue albuterol  once every 4 hours as needed for cough or wheeze You may use albuterol  2 puffs 5-15 minutes before activity to decrease cough or wheeze For asthma flare, begin Pulmicort  0.5 mg via nebulizer twice a day to prevent cough or wheeze. You may stop in 2 weeks or until  cough and wheeze free.   Allergic rhinitis-improved on AIT Continue allergen avoidance measures directed toward pollen, mold, pet, and dust mite as listed below Continue Flonase  nasal spray 2 sprays in each nostril once a day as needed for stuffy nose Continue ipratropium nasal spray 2 sprays in each nostril twice a day as needed for nasal symptoms Consider saline nasal rinses as needed for nasal symptoms. Use this before any medicated nasal sprays for best result Continue allergen immunotherapy per protocol and have access to an epinephrine  autoinjector set  Pruritus-resolved You can begin an antihistamine once a day as needed for itch. Some examples of over the counter antihistamines include Zyrtec (cetirizine), Xyzal (levocetirizine), Allegra (fexofenadine), and Claritin (loratidine).   Atopic dermatitis-at goal Continue a daily moisturizer twice a day For red and itchy areas below your face, begin triamcinolone  0.1% ointment up to twice a day as needed. Do not use this medication longer than 2 weeks in a row  Food allergy -stable Continue to avoid lamb, beef, pork, dairy products, artificial sweeteners, and agave.  In case of an allergic reaction, take Benadryl  50 mg every 4 hours, and if life-threatening symptoms occur, inject with EpiPen  0.3 mg. If your symptoms re-occur, begin a journal of events that occurred for up to 6 hours before your symptoms began including symptoms you experienced,foods and beverages consumed, and medications taken. A lab has been ordered to evaluate for alpha gal allergy .  Call the clinic if  this treatment plan is not working well for you.  Follow up in 6 months or sooner if needed.  Other: received allergy  injection in clinic today.  Rocky Endow, MD  [facility] 

## 2023-10-10 LAB — ALPHA-GAL PANEL
Allergen Lamb IgE: 0.18 kU/L — AB
Beef IgE: 0.63 kU/L — AB
IgE (Immunoglobulin E), Serum: 23 [IU]/mL (ref 6–495)
O215-IgE Alpha-Gal: 1.1 kU/L — AB
Pork IgE: 0.22 kU/L — AB

## 2023-10-10 NOTE — Progress Notes (Signed)
Please let Emily [name] know that her alpha-gal is still positive and her results are similar to last year. We can continue to check yearly. For now, continue avoidance.

## 2023-10-14 LAB — GENECONNECT MOLECULAR SCREEN: Genetic Analysis Overall Interpretation: NEGATIVE

## 2023-11-01 ENCOUNTER — Ambulatory Visit: Payer: Commercial Managed Care - PPO

## 2023-11-01 NOTE — Progress Notes (Signed)
 New orthotics to be made patient cannot wear old ones after fascia release  No charge  Britton Cane CPed CFo,, CFm

## 2023-11-07 ENCOUNTER — Ambulatory Visit (INDEPENDENT_AMBULATORY_CARE_PROVIDER_SITE_OTHER): Payer: Commercial Managed Care - PPO

## 2023-11-07 DIAGNOSIS — J309 Allergic rhinitis, unspecified: Secondary | ICD-10-CM | POA: Diagnosis not present

## 2023-11-12 ENCOUNTER — Telehealth: Payer: Self-pay

## 2023-11-12 NOTE — Telephone Encounter (Signed)
Will be giving patel orthotics to take for appt on 2/27

## 2023-11-21 ENCOUNTER — Encounter: Payer: Self-pay | Admitting: Podiatry

## 2023-11-21 ENCOUNTER — Ambulatory Visit (INDEPENDENT_AMBULATORY_CARE_PROVIDER_SITE_OTHER): Payer: Commercial Managed Care - PPO | Admitting: Podiatry

## 2023-11-21 DIAGNOSIS — M722 Plantar fascial fibromatosis: Secondary | ICD-10-CM

## 2023-11-21 DIAGNOSIS — Z9889 Other specified postprocedural states: Secondary | ICD-10-CM

## 2023-11-21 DIAGNOSIS — M205X1 Other deformities of toe(s) (acquired), right foot: Secondary | ICD-10-CM

## 2023-11-21 NOTE — Progress Notes (Signed)
 Orthotics are fitting well and functioning well.  If any foot and ankle issues she will come back and see me

## 2023-11-25 ENCOUNTER — Telehealth: Payer: Self-pay | Admitting: Podiatry

## 2023-11-25 NOTE — Telephone Encounter (Signed)
 Pt called and is upset. She picked up orthotics last week and they are too short and causing pain. She is frustrated as this has been an ongoing issue with the orthotics for about a yr now. She is scheduled to see Nicki Guadalajara on 3/5 in [place] and pt is aware in [place] office

## 2023-11-26 ENCOUNTER — Encounter: Payer: Self-pay | Admitting: Nurse Practitioner

## 2023-11-27 ENCOUNTER — Ambulatory Visit

## 2023-11-27 MED ORDER — SCOPOLAMINE 1 MG/3DAYS TD PT72: 1.0000 | MEDICATED_PATCH | TRANSDERMAL | 12 refills | Status: AC

## 2023-11-27 NOTE — Progress Notes (Signed)
 Patient was in with both pair we have made for her  Patient states that her original pair that was lost plastic came up under MT heads and to under the toe bend I told her we do not usually extend plastic under MT heads as it could immobilize and or reduce function of toes  I will send last pair orthotics back as right is more rigid than left also extend 1/4" longer distally and retop with 3/4 length EVA foam   I heated 1st right insert of foot maxx pr and made it more flexible patient was very happy with this as it makes more tolerable and less rigid  Seems patient is still sensitive in this are post fascia release \  Addison Bailey CPed, CFo, CFm

## 2023-12-11 ENCOUNTER — Ambulatory Visit (INDEPENDENT_AMBULATORY_CARE_PROVIDER_SITE_OTHER): Admitting: *Deleted

## 2023-12-11 DIAGNOSIS — J309 Allergic rhinitis, unspecified: Secondary | ICD-10-CM | POA: Diagnosis not present

## 2023-12-31 ENCOUNTER — Ambulatory Visit (INDEPENDENT_AMBULATORY_CARE_PROVIDER_SITE_OTHER): Payer: Self-pay | Admitting: Nurse Practitioner

## 2023-12-31 ENCOUNTER — Encounter: Payer: Self-pay | Admitting: Nurse Practitioner

## 2023-12-31 VITALS — BP 90/62 | HR 71 | Wt 135.2 lb

## 2023-12-31 DIAGNOSIS — E559 Vitamin D deficiency, unspecified: Secondary | ICD-10-CM

## 2023-12-31 DIAGNOSIS — E7800 Pure hypercholesterolemia, unspecified: Secondary | ICD-10-CM

## 2023-12-31 DIAGNOSIS — I2721 Secondary pulmonary arterial hypertension: Secondary | ICD-10-CM

## 2023-12-31 DIAGNOSIS — E538 Deficiency of other specified B group vitamins: Secondary | ICD-10-CM

## 2023-12-31 DIAGNOSIS — F3170 Bipolar disorder, currently in remission, most recent episode unspecified: Secondary | ICD-10-CM | POA: Diagnosis not present

## 2023-12-31 NOTE — Assessment & Plan Note (Signed)
Chronic.  Controlled.  Continue with current medication regimen of Wellbutrin daily.  Labs ordered today.  Return to clinic in 6 months for reevaluation.  Call sooner if concerns arise.

## 2023-12-31 NOTE — Assessment & Plan Note (Signed)
 Labs ordered at visit today.  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  Will make recommendations based on labs.

## 2023-12-31 NOTE — Addendum Note (Signed)
 Addended by: Larae Grooms on: 12/31/2023 09:11 AM   Modules accepted: Orders

## 2023-12-31 NOTE — Progress Notes (Signed)
 BP 90/62 (BP Location: Right Arm, Patient Position: Sitting, Cuff Size: Normal)   Pulse 71   Wt 135 lb 3.2 oz (61.3 kg)   LMP 06/01/2015 (Within Days)   BMI 20.11 kg/m    Subjective:    Patient ID: Emily [name], female    DOB: 10-07-1980, 43 y.o.   MRN: [id number]  HPI: Emily [name] is a 43 y.o. female  Chief Complaint  Patient presents with   Post gastric sleeve    Concerned with vitamin defiencies- numbness legs and hands, headaches, run over by truck feeling which is all the same symptoms as prior.    Manic Behavior    Going well no current concerns   MOOD Patient states she feels like her mood has been good.  She is getting the wellbutrin manufacturer was changed and preferred the other dose.  She feels like she is doing very well with her current medication regimen.  Feels like these are good doses for her.    Flowsheet Row Office Visit from 12/31/2023 in [facility]  PHQ-9 Total Score 3         12/31/2023    8:13 AM 07/02/2023    9:25 AM 06/10/2023   10:34 AM 12/31/2022    9:18 AM  GAD 7 : Generalized Anxiety Score  Nervous, Anxious, on Edge 0 0 0 0  Control/stop worrying 0 0 0 0  Worry too much - different things 0 0 0 0  Trouble relaxing 0 0 0 1  Restless 0 0 0 1  Easily annoyed or irritable 0 0 1 1  Afraid - awful might happen 0 0 0 0  Total GAD 7 Score 0 0 1 3  Anxiety Difficulty Not difficult at all Not difficult at all Not difficult at all Not difficult at all    POST GASTRIC SLEEVE Patient states she is feeling great.  She is almost 1 year out from her surgery.  She is trying to maintain her weight.  She is training for a half marathon.  Patient is having numbness and tingling and a lot of fatigue.     Relevant past medical, surgical, family and social history reviewed and updated as indicated. Interim medical history since our last visit reviewed. Allergies and medications reviewed and updated.  Review of Systems   Constitutional:  Positive for fatigue.  Neurological:  Positive for numbness.  Psychiatric/Behavioral:  Positive for dysphoric mood. Negative for suicidal ideas. The patient is nervous/anxious.     Per HPI unless specifically indicated above     Objective:    BP 90/62 (BP Location: Right Arm, Patient Position: Sitting, Cuff Size: Normal)   Pulse 71   Wt 135 lb 3.2 oz (61.3 kg)   LMP 06/01/2015 (Within Days)   BMI 20.11 kg/m   Wt Readings from Last 3 Encounters:  12/31/23 135 lb 3.2 oz (61.3 kg)  10/07/23 139 lb 8 oz (63.3 kg)  07/02/23 149 lb (67.6 kg)    Physical Exam Vitals and nursing note reviewed.  Constitutional:      General: She is not in acute distress.    Appearance: Normal appearance. She is normal weight. She is not ill-appearing, toxic-appearing or diaphoretic.  HENT:     Head: Normocephalic.     Right Ear: External ear normal.     Left Ear: External ear normal.     Nose: Nose normal.     Mouth/Throat:     Mouth: Mucous membranes are  moist.     Pharynx: Oropharynx is clear.  Eyes:     General:        Right eye: No discharge.        Left eye: No discharge.     Extraocular Movements: Extraocular movements intact.     Conjunctiva/sclera: Conjunctivae normal.     Pupils: Pupils are equal, round, and reactive to light.  Cardiovascular:     Rate and Rhythm: Normal rate and regular rhythm.     Heart sounds: No murmur heard. Pulmonary:     Effort: Pulmonary effort is normal. No respiratory distress.     Breath sounds: Normal breath sounds. No wheezing or rales.  Musculoskeletal:     Cervical back: Normal range of motion and neck supple.  Skin:    General: Skin is warm and dry.     Capillary Refill: Capillary refill takes less than 2 seconds.  Neurological:     General: No focal deficit present.     Mental Status: She is alert and oriented to person, place, and time. Mental status is at baseline.  Psychiatric:        Mood and Affect: Mood normal.         Behavior: Behavior normal.        Thought Content: Thought content normal.        Judgment: Judgment normal.     Results for orders placed or performed in visit on 10/07/23  Alpha-Gal Panel   Collection Time: 10/07/23  9:17 AM  Result Value Ref Range   Class Description Allergens Comment    IgE (Immunoglobulin E), Serum 23 6 - 495 IU/mL   Pork IgE 0.22 (A) Class 0/I kU/L   Beef IgE 0.63 (A) Class II kU/L   Allergen Lamb IgE 0.18 (A) Class 0/I kU/L   O215-IgE Alpha-Gal 1.10 (A) Class II kU/L      Assessment & Plan:   Problem List Items Addressed This Visit       Cardiovascular and Mediastinum   Pulmonary artery hypertension (HCC)   Chronic.  Controlled.  Continue with current medication regimen.  Labs ordered today.  Return to clinic in 6 months for reevaluation.  Call sooner if concerns arise.       Relevant Orders   Comprehensive metabolic panel with GFR     Other   Bipolar disorder (HCC) - Primary   Chronic.  Controlled.  Continue with current medication regimen of Wellbutrin daily.  Labs ordered today.  Return to clinic in 6 months for reevaluation.  Call sooner if concerns arise.       Vitamin B12 deficiency   Labs ordered at visit today.  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  Will make recommendations based on labs.      Relevant Orders   B12   Vitamin D deficiency   Labs ordered at visit today.  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  Will make recommendations based on labs.      Relevant Orders   Vitamin D 1,25 dihydroxy   Hypercholesteremia   Labs ordered at visit today.  Will make recommendations based on lab results.       Relevant Orders   Lipid panel      Follow up plan: Return in about 6 months (around 07/01/2024) for Physical and Fasting labs.

## 2023-12-31 NOTE — Assessment & Plan Note (Signed)
 Labs ordered at visit today.  Will make recommendations based on lab results.

## 2023-12-31 NOTE — Assessment & Plan Note (Signed)
 Chronic.  Controlled.  Continue with current medication regimen.  Labs ordered today.  Return to clinic in 6 months for reevaluation.  Call sooner if concerns arise.  ? ?

## 2024-01-01 ENCOUNTER — Encounter: Payer: Self-pay | Admitting: Nurse Practitioner

## 2024-01-01 ENCOUNTER — Encounter: Payer: Self-pay | Admitting: Skilled Nursing Facility1

## 2024-01-01 ENCOUNTER — Encounter: Attending: Surgery | Admitting: Skilled Nursing Facility1

## 2024-01-01 ENCOUNTER — Telehealth: Payer: Self-pay

## 2024-01-01 VITALS — Wt 137.0 lb

## 2024-01-01 DIAGNOSIS — E669 Obesity, unspecified: Secondary | ICD-10-CM | POA: Insufficient documentation

## 2024-01-01 LAB — COMPREHENSIVE METABOLIC PANEL WITH GFR
ALT: 22 IU/L (ref 0–32)
AST: 16 IU/L (ref 0–40)
Albumin: 4.7 g/dL (ref 3.9–4.9)
Alkaline Phosphatase: 80 IU/L (ref 44–121)
BUN/Creatinine Ratio: 18 (ref 9–23)
BUN: 15 mg/dL (ref 6–24)
Bilirubin Total: 1.1 mg/dL (ref 0.0–1.2)
CO2: 26 mmol/L (ref 20–29)
Calcium: 10.1 mg/dL (ref 8.7–10.2)
Chloride: 101 mmol/L (ref 96–106)
Creatinine, Ser: 0.84 mg/dL (ref 0.57–1.00)
Globulin, Total: 2.4 g/dL (ref 1.5–4.5)
Glucose: 76 mg/dL (ref 70–99)
Potassium: 5 mmol/L (ref 3.5–5.2)
Sodium: 140 mmol/L (ref 134–144)
Total Protein: 7.1 g/dL (ref 6.0–8.5)
eGFR: 88 mL/min/{1.73_m2} (ref >59)

## 2024-01-01 LAB — LIPID PANEL
Chol/HDL Ratio: 2.5 ratio (ref 0.0–4.4)
Cholesterol, Total: 201 mg/dL — ABNORMAL HIGH (ref 100–199)
HDL: 79 mg/dL (ref >39)
LDL Chol Calc (NIH): 112 mg/dL — ABNORMAL HIGH (ref 0–99)
Triglycerides: 56 mg/dL (ref 0–149)
VLDL Cholesterol Cal: 10 mg/dL (ref 5–40)

## 2024-01-01 LAB — VITAMIN D 25 HYDROXY (VIT D DEFICIENCY, FRACTURES): Vit D, 25-Hydroxy: 66.9 ng/mL (ref 30.0–100.0)

## 2024-01-01 LAB — VITAMIN B12: Vitamin B-12: 462 pg/mL (ref 232–1245)

## 2024-01-01 NOTE — Progress Notes (Signed)
 Bariatric Nutrition Follow-Up Visit Medical Nutrition Therapy   Appt time: 2:55-3:30   Surgery date: 08/14/2022 Surgery type: Sleeve Gastrectomy  Anthropometrics  Start weight at [facility]: 252 lbs (date: 09/11/2021)  Height: 68 in Weight today: 137 pounds   Clinical  Medical hx: sleep apnea, bipolar, IBS, Alpha Gal Medications: see list  Labs:  Notable signs/symptoms: none reported  Any previous deficiencies? Yes; vitamin D, vitamin B12, iron  Bowel Habits: Every day to every other day no complaints   Body Composition Scale 10/09/2022 01/14/2023 05/13/23 01/01/2024  Current Body Weight 230.8 198.1  162.5 lb 137  Total Body Fat % 41.0 35.7 28.3 21.3  Visceral Fat 11 9 6 4   Fat-Free Mass % 58.9 64.2 71.6 78.6   Total Body Water % 43.9 46.6 50.3 53.8  Muscle-Mass lbs 34.2 34.5 34.2 33.4  BMI 35.0 30 24.6 20.6  Body Fat Displacement             Torso  lbs 58.6 43.8 28.4 18         Left Leg  lbs 11.7 8.7 5.6 3.6         Right Leg  lbs 11.7 8.7 5.6 3.6         Left Arm  lbs 5.8 4.3 2.8 1.8         Right Arm   lbs 5.8 4.3 2.8 1.8     Lifestyle & Dietary Hx   Pt arrives with moderate-severe wasting.  Pt states she is training for a half marathon realizing she does not have enough energy to actually accomplish that.   Estimated daily fluid intake: 67 oz (water) + coffee/tea also Estimated daily protein intake: 70-80 g Supplements: multivitamin and states she cannot do the calcium because it tares her stomach up  Current average weekly physical activity: training for half marathon   24-Hr Dietary Recall: 2-3 snacks per day First Meal: 2-3 mini egg quiche 140-210 cal, 8-12g fat, 10-15 CHO, 10-15 protein  Snack: [place] jerky or 1/4 nuts Second Meal: shrimp on green leafy salad  Snack: [place] jerky or protein bar or 1/4 nuts Third Meal: cauliflower pizza  Snack: protein snack sweet Beverages: 67.6 oz water, Gatorade zero, diluted juice, 24 oz coffee + almond creamer   Post-Op  Goals/ Signs/ Symptoms Using straws: no Drinking while eating: no Chewing/swallowing difficulties: no Changes in vision: no Changes to mood/headaches: no Hair loss/changes to skin/nails: some Difficulty focusing/concentrating: no Sweating: no Limb weakness: no Dizziness/lightheadedness: no Palpitations: no Carbonated/caffeinated beverages: no N/V/D/C/Gas: yes, from newly diagnosed allergies Abdominal pain: no Dumping syndrome: no    NUTRITION DIAGNOSIS  Inadequate dietary intake related to underconsumption of food as evidenced by moderate wasting, decreased muscle mass, weight loss, and fatigue.   NUTRITION INTERVENTION Nutrition counseling (C-1) and education (E-2) to facilitate bariatric surgery goals, including: What is wasting and how does it appear on the body What does wasting feel like Increased fat and carbohydrate diet plan discussed and created for pt  Handouts Previously Provided Include  Bariatric MyPlate Maintenance Plan (1 Year)  Learning Style & Readiness for Change Teaching method utilized: Visual & Auditory  Demonstrated degree of understanding via: Teach Back  Readiness Level: Change in progress. Barriers to learning/adherence to lifestyle change: None identified  RD's Notes for Next Visit Assess adherence to pt chosen goals  MONITORING & EVALUATION Dietary intake, weekly physical activity, body weight.  Next Steps Patient is to follow-up in [facility] May after [place]

## 2024-01-01 NOTE — Telephone Encounter (Signed)
 Sending orthotics to [facility]

## 2024-01-02 ENCOUNTER — Ambulatory Visit (INDEPENDENT_AMBULATORY_CARE_PROVIDER_SITE_OTHER): Admitting: Podiatry

## 2024-01-02 DIAGNOSIS — M722 Plantar fascial fibromatosis: Secondary | ICD-10-CM

## 2024-01-02 NOTE — Progress Notes (Signed)
 Orthotics are fitting well and functioning well.  If any foot and ankle issues she will come back and see me

## 2024-01-14 ENCOUNTER — Other Ambulatory Visit

## 2024-01-15 ENCOUNTER — Ambulatory Visit (INDEPENDENT_AMBULATORY_CARE_PROVIDER_SITE_OTHER): Payer: Self-pay

## 2024-01-15 DIAGNOSIS — J309 Allergic rhinitis, unspecified: Secondary | ICD-10-CM | POA: Diagnosis not present

## 2024-01-16 ENCOUNTER — Telehealth: Payer: Self-pay | Admitting: Skilled Nursing Facility1

## 2024-01-16 DIAGNOSIS — E669 Obesity, unspecified: Secondary | ICD-10-CM

## 2024-01-16 NOTE — Telephone Encounter (Signed)
 Called pt due to emails to discuss low blood sugar concerns.  Advice given: It may be too soon to start working out at this point in your undernutrition Check your blood sugar any time you have those symptoms if below 70 drink 4 ounces of juice then rest and eat a meal with protein in it; do NOT continue workout  Check your blood sugar before you workout ensure it is 100   I have 2 eggs a [place] sausage a piece of bread and some fruit.  I exercise in the morning normally.  What do you think would be good for me to get to check my blood sugar levels? Yahoo Mail: Search, Daren Eck  On Thu, Jan 16, 2024 at 2:18 PM, Leonila Speranza [email or web address]> wrote: No such thing as a silly question. It may be soon for you to exercise, you may need to eat more regularly until you start increasing your activity.   Checking your blood sugar before you workout is a great idea.   What do you eat before you exercise?    From: Josie Night [email or web address]>  Sent: Thursday, January 16, 2024 2:15 PM To: Verdon [email or web address]> Subject: Just a quick question.   *Caution - External email - see footer for warnings* Hey this is Josie Night.  I have a question. Could my blood sugar levels be dropping during exercise and causing me to be light-headed and dizzy? And yes I have started eating carbs and fruits. Those have been helping but the last few times I have exercised I have been lightheaded and dizzy. I have had to stop exercising because I get lightheaded. So my 2nd question is if I should be checking my blood sugar levels before I exercise?  Sorry if this sounds like a silly question to ask but it keeps happening and can't figure out what else it could be. Thanks for your time Mollie Rossano  Yahoo Mail: Search, Lessie Ratel, [place] WARNING: This email originated outside of [facility]. Even if this looks like a [employer], it is not. Do not provide  your username, password, or any other personal information in response to this or any other email. [facility] will never ask you for your username or password via email. DO NOT CLICK links or attachments unless you are positive the content is safe. If in doubt about the safety of this message, select the [employer] Phishing button, which forwards to IT Security.     NOTICE: This message may contain confidential information intended only for the recipient. If you have received this communication in error, please notify the sender immediately by replying to the message and deleting it from your computer.  WARNING: This email originated outside of [facility]. Even if this looks like a [employer], it is not. Do not provide your username, password, or any other personal information in response to this or any other email. [facility] will never ask you for your username or password via email. DO NOT CLICK links or attachments unless you are positive the content is safe. If in doubt about the safety of this message, select the [employer] Phishing button, which forwards to IT Security.

## 2024-01-20 DIAGNOSIS — J302 Other seasonal allergic rhinitis: Secondary | ICD-10-CM | POA: Diagnosis not present

## 2024-01-20 NOTE — Progress Notes (Signed)
 VIALS MADE 01-20-24. EXP 01-19-25

## 2024-01-21 DIAGNOSIS — J3081 Allergic rhinitis due to animal (cat) (dog) hair and dander: Secondary | ICD-10-CM | POA: Diagnosis not present

## 2024-02-13 ENCOUNTER — Encounter: Payer: Self-pay | Admitting: Skilled Nursing Facility1

## 2024-02-13 ENCOUNTER — Ambulatory Visit (INDEPENDENT_AMBULATORY_CARE_PROVIDER_SITE_OTHER)

## 2024-02-13 ENCOUNTER — Encounter: Attending: Surgery | Admitting: Skilled Nursing Facility1

## 2024-02-13 VITALS — Wt 139.6 lb

## 2024-02-13 DIAGNOSIS — J309 Allergic rhinitis, unspecified: Secondary | ICD-10-CM

## 2024-02-13 DIAGNOSIS — E669 Obesity, unspecified: Secondary | ICD-10-CM | POA: Diagnosis present

## 2024-02-13 NOTE — Progress Notes (Signed)
 Bariatric Nutrition Follow-Up Visit Medical Nutrition Therapy   Appt time: 8:51-9:15  Surgery date: 08/14/2022 Surgery type: Sleeve Gastrectomy  Anthropometrics  Start weight at [facility]: 252 lbs (date: 09/11/2021)  Height: 68 in Weight today: 139.6 pounds   Clinical  Medical hx: sleep apnea, bipolar, IBS, Alpha Gal Medications: see list  Labs:  Notable signs/symptoms: none reported  Any previous deficiencies? Yes; vitamin D , vitamin B12, iron  Bowel Habits: Every day to every other day no complaints   Body Composition Scale 01/14/2023 05/13/23 01/01/2024 02/13/2024  Current Body Weight 198.1  162.5 lb 137 139.6  Total Body Fat % 35.7 28.3 21.3 22.7  Visceral Fat 9 6 4 4   Fat-Free Mass % 64.2 71.6 78.6 77.2   Total Body Water % 46.6 50.3 53.8 53.1  Muscle-Mass lbs 34.5 34.2 33.4 33.2  BMI 30 24.6 20.6 21  Body Fat Displacement             Torso  lbs 43.8 28.4 18 19.5         Left Leg  lbs 8.7 5.6 3.6 3.9         Right Leg  lbs 8.7 5.6 3.6 3.9         Left Arm  lbs 4.3 2.8 1.8 1.9         Right Arm   lbs 4.3 2.8 1.8 1.9     Lifestyle & Dietary Hx   Pt arrives with moderate-severe downgraded to now mild-moderate wasting.  Pt states she is training for a half marathon realizing she does not have enough energy to actually accomplish that.   Pt state she was feeling dizziness and checked her blood sugar revealing 50-60's. Pt state she has been eating more fruit and carrying juice with her realizing now since her low blood sugar status that her body actually does require carbohydrates.   Estimated daily fluid intake: 67 oz (water) + coffee/tea also Estimated daily protein intake: 70-80 g Supplements: multivitamin and states she cannot do the calcium because it tares her stomach up  Current average weekly physical activity: training for half marathon   24-Hr Dietary Recall: 2-3 snacks per day First Meal: 2-3 mini egg quiche 140-210 cal, 8-12g fat, 10-15 CHO, 10-15 protein + 1  piece whole grain bread Snack: crackers and cheese or fruit  Second Meal: shrimp or chicken on green leafy salad + 3 slices mandrin oranges  Snack: fruit + meat stick  Third Meal: [place] burger patty just the patty Snack: protein snack sweet Beverages: 67.6 oz water, Gatorade zero, diluted juice, 24 oz coffee + almond creamer   Post-Op Goals/ Signs/ Symptoms Using straws: no Drinking while eating: no Chewing/swallowing difficulties: no Changes in vision: no Changes to mood/headaches: no Hair loss/changes to skin/nails: some Difficulty focusing/concentrating: no Sweating: no Limb weakness: no Dizziness/lightheadedness: no Palpitations: no Carbonated/caffeinated beverages: no N/V/D/C/Gas: yes, from newly diagnosed allergies Abdominal pain: no Dumping syndrome: no    NUTRITION DIAGNOSIS  Inadequate dietary intake related to underconsumption of food as evidenced by moderate wasting, decreased muscle mass, weight loss, and fatigue.   NUTRITION INTERVENTION Nutrition counseling (C-1) and education (E-2) to facilitate bariatric surgery goals, including: What is wasting and how does it appear on the body What does wasting feel like Increased fat and carbohydrate diet plan discussed and created for pt  Handouts Previously Provided Include  Bariatric MyPlate Maintenance Plan (1 Year)  Goals: -try preserves on your toast -add in potateos or beans daily  Learning Style &  Readiness for Change Teaching method utilized: Visual & Auditory  Demonstrated degree of understanding via: Teach Back  Readiness Level: Change in progress. Barriers to learning/adherence to lifestyle change: None identified  RD's Notes for Next Visit Assess adherence to pt chosen goals  MONITORING & EVALUATION Dietary intake, weekly physical activity, body weight.  Next Steps Patient is to follow-up in 4 weeks

## 2024-03-16 ENCOUNTER — Ambulatory Visit (INDEPENDENT_AMBULATORY_CARE_PROVIDER_SITE_OTHER)

## 2024-03-16 DIAGNOSIS — J309 Allergic rhinitis, unspecified: Secondary | ICD-10-CM

## 2024-03-24 ENCOUNTER — Encounter: Payer: Self-pay | Admitting: Skilled Nursing Facility1

## 2024-03-24 ENCOUNTER — Encounter: Attending: Surgery | Admitting: Skilled Nursing Facility1

## 2024-03-24 VITALS — Ht 68.0 in | Wt 146.1 lb

## 2024-03-24 DIAGNOSIS — E669 Obesity, unspecified: Secondary | ICD-10-CM | POA: Insufficient documentation

## 2024-03-24 NOTE — Progress Notes (Signed)
 Bariatric Nutrition Follow-Up Visit Medical Nutrition Therapy   Appt time: 11:31-12:00  Surgery date: 08/14/2022 Surgery type: Sleeve Gastrectomy  Anthropometrics  Start weight at [facility]: 252 lbs (date: 09/11/2021)  Height: 68 in Weight today: 146.1 pounds   Clinical  Medical hx: sleep apnea, bipolar, IBS, Alpha Gal Medications: see list  Labs:  Notable signs/symptoms: none reported  Any previous deficiencies? Yes; vitamin D , vitamin B12, iron  Bowel Habits: Every day to every other day no complaints   Body Composition Scale 01/14/2023 05/13/23 01/01/2024 02/13/2024 03/24/2024  Current Body Weight 198.1  162.5 lb 137 139.6 146.1  Total Body Fat % 35.7 28.3 21.3 22.7 23.8  Visceral Fat 9 6 4 4 5   Fat-Free Mass % 64.2 71.6 78.6 77.2 76.1   Total Body Water % 46.6 50.3 53.8 53.1 52.5  Muscle-Mass lbs 34.5 34.2 33.4 33.2 33.9  BMI 30 24.6 20.6 21 22.1  Body Fat Displacement              Torso  lbs 43.8 28.4 18 19.5 21.5         Left Leg  lbs 8.7 5.6 3.6 3.9 4.3         Right Leg  lbs 8.7 5.6 3.6 3.9 4.3         Left Arm  lbs 4.3 2.8 1.8 1.9 2.1         Right Arm   lbs 4.3 2.8 1.8 1.9 2.1     Lifestyle & Dietary Hx   Pt arrives with moderate-severe downgraded to now mild-moderate wasting.  Pt states she is training for a half marathon realizing she does not have enough energy to actually accomplish that. Pt has put activity on hold while she works on becoming properly nourished.   Pt arrives with a fuller face and more energy in her conversation.  Pt states she has been keeping track of her blood sugars: 1 occurrence 61 which woke her up after having gone to bed, typically seeing in the high 70s-low to mid 80's  Pt states she is happy to be between 145-155 pounds.    Estimated daily fluid intake: 67 oz (water) + coffee/tea also Estimated daily protein intake: 70-80 g Supplements: multivitamin and states she cannot do the calcium because it tares her stomach up  Current  average weekly physical activity: ADL's while working on correcting malnutrition   24-Hr Dietary Recall: 2-3 snacks per day First Meal: breakfast burrito: egg, cheese, beans, tortilla sometimes with a small piece of fruit Snack: crackers and cheese or fruit  Second Meal: mayo chicken salad + crackers + fruit Snack: fruit + meat stick or protein bar Third Meal: caulifower crust pizza + chicken  Snack: almond flour tortilla chips + salsa Beverages: 67.6 oz water, Gatorade zero, diluted juice, 24 oz coffee + almond creamer   Post-Op Goals/ Signs/ Symptoms: 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 and this has gotten better more recently  Using straws: no Drinking while eating: no Chewing/swallowing difficulties: no Changes in vision: no Changes to mood/headaches: no Hair loss/changes to skin/nails: some Difficulty focusing/concentrating: no Sweating: no Limb weakness: no Dizziness/lightheadedness: no Palpitations: no Carbonated/caffeinated beverages: no N/V/D/C/Gas: none reported  Abdominal pain: no Dumping syndrome: no    NUTRITION DIAGNOSIS  Inadequate dietary intake related to underconsumption of food as evidenced by moderate wasting, decreased muscle mass, weight loss, and fatigue.   NUTRITION INTERVENTION: continued plan Nutrition counseling (C-1) and education (E-2) to facilitate  bariatric surgery goals, including: What is wasting and how does it appear on the body What does wasting feel like Increased fat and carbohydrate diet plan discussed and created for pt  Handouts Previously Provided Include  Bariatric MyPlate Maintenance Plan (1 Year)  Goals: -read you nutrition facts labels a subtract fiber from the carbohydrate total to know how many usable carbohydrates are int he product to ensure you are eating enough  -check blood sugar before bed -take a vitamin B1 until the bottle is empty  Learning Style & Readiness for Change Teaching method  utilized: Visual & Auditory  Demonstrated degree of understanding via: Teach Back  Readiness Level: Change in progress. Barriers to learning/adherence to lifestyle change: None identified  RD's Notes for Next Visit Assess adherence to pt chosen goals  MONITORING & EVALUATION Dietary intake, weekly physical activity, body weight.  Next Steps Patient is to follow-up in 4 weeks: Next visit recommend bone density scan

## 2024-03-30 ENCOUNTER — Encounter: Payer: Self-pay | Admitting: Internal Medicine

## 2024-03-30 ENCOUNTER — Ambulatory Visit (INDEPENDENT_AMBULATORY_CARE_PROVIDER_SITE_OTHER): Payer: Commercial Managed Care - PPO | Admitting: Internal Medicine

## 2024-03-30 ENCOUNTER — Other Ambulatory Visit: Payer: Self-pay

## 2024-03-30 VITALS — BP 98/64 | HR 72 | Temp 98.3°F | Ht 67.32 in | Wt 148.9 lb

## 2024-03-30 DIAGNOSIS — L2089 Other atopic dermatitis: Secondary | ICD-10-CM

## 2024-03-30 DIAGNOSIS — J302 Other seasonal allergic rhinitis: Secondary | ICD-10-CM | POA: Diagnosis not present

## 2024-03-30 DIAGNOSIS — J3089 Other allergic rhinitis: Secondary | ICD-10-CM

## 2024-03-30 DIAGNOSIS — J4520 Mild intermittent asthma, uncomplicated: Secondary | ICD-10-CM

## 2024-03-30 DIAGNOSIS — T781XXD Other adverse food reactions, not elsewhere classified, subsequent encounter: Secondary | ICD-10-CM

## 2024-03-30 DIAGNOSIS — T781XXA Other adverse food reactions, not elsewhere classified, initial encounter: Secondary | ICD-10-CM

## 2024-03-30 MED ORDER — TRIAMCINOLONE ACETONIDE 0.1 % EX OINT: TOPICAL_OINTMENT | CUTANEOUS | 1 refills | Status: DC

## 2024-03-30 MED ORDER — ALBUTEROL SULFATE HFA 108 (90 BASE) MCG/ACT IN AERS: 1.0000 | INHALATION_SPRAY | Freq: Four times a day (QID) | RESPIRATORY_TRACT | 1 refills | Status: DC | PRN

## 2024-03-30 NOTE — Patient Instructions (Addendum)
 Asthma-mild intermittent Continue albuterol  once every 4 hours as needed for cough or wheeze You may use albuterol  2 puffs 5-15 minutes before activity to decrease cough or wheeze For asthma flare, begin Pulmicort  0.5 mg via nebulizer twice a day to prevent cough or wheeze. You may stop in 2 weeks or until cough and wheeze free.   Allergic rhinitis Continue allergen avoidance measures directed toward pollen, mold, pet, and dust mite as listed below Continue Flonase  nasal spray 2 sprays in each nostril once a day as needed for stuffy nose Continue ipratropium nasal spray 2 sprays in each nostril twice a day as needed for nasal symptoms Consider saline nasal rinses as needed for nasal symptoms. Use this before any medicated nasal sprays for best result Continue allergen immunotherapy per protocol and have access to an epinephrine  autoinjector set  Pruritus-resolved You can begin an antihistamine once a day as needed for itch. Some examples of over the counter antihistamines include Zyrtec (cetirizine), Xyzal (levocetirizine), Allegra (fexofenadine), and Claritin (loratidine).   Atopic dermatitis-at goal Continue a daily moisturizer twice a day For red and itchy areas below your face, begin triamcinolone  0.1% ointment up to twice a day as needed. Do not use this medication longer than 2 weeks in a row  Food allergy  Continue to avoid lamb, beef, pork, dairy products, artificial sweeteners, and agave.  In case of an allergic reaction, take Benadryl  50 mg every 4 hours, and if life-threatening symptoms occur, inject with EpiPen  0.3 mg. If your symptoms re-occur, begin a journal of events that occurred for up to 6 hours before your symptoms began including symptoms you experienced,foods and beverages consumed, and medications taken.  Call the clinic if this treatment plan is not working well for you.  Follow up in 6 months or sooner if needed.  Reducing Pollen Exposure The American Academy of  Allergy , Asthma and Immunology suggests the following steps to reduce your exposure to pollen during allergy  seasons. Do not hang sheets or clothing out to dry; pollen may collect on these items. Do not mow lawns or spend time around freshly cut grass; mowing stirs up pollen. Keep windows closed at night.  Keep car windows closed while driving. Minimize morning activities outdoors, a time when pollen counts are usually at their highest. Stay indoors as much as possible when pollen counts or humidity is high and on windy days when pollen tends to remain in the air longer. Use air conditioning when possible.  Many air conditioners have filters that trap the pollen spores. Use a HEPA room air filter to remove pollen form the indoor air you breathe.  Control of Dog or Cat Allergen Avoidance is the best way to manage a dog or cat allergy . If you have a dog or cat and are allergic to dog or cats, consider removing the dog or cat from the home. If you have a dog or cat but don't want to find it a new home, or if your family wants a pet even though someone in the household is allergic, here are some strategies that may help keep symptoms at bay:  Keep the pet out of your bedroom and restrict it to only a few rooms. Be advised that keeping the dog or cat in only one room will not limit the allergens to that room. Don't pet, hug or kiss the dog or cat; if you do, wash your hands with soap and water. High-efficiency particulate air (HEPA) cleaners run continuously in a bedroom or living  room can reduce allergen levels over time. Regular use of a high-efficiency vacuum cleaner or a central vacuum can reduce allergen levels. Giving your dog or cat a bath at least once a week can reduce airborne allergen.   Control of Dust Mite Allergen Dust mites play a major role in allergic asthma and rhinitis. They occur in environments with high humidity wherever human skin is found. Dust mites absorb humidity from the  atmosphere (ie, they do not drink) and feed on organic matter (including shed human and animal skin). Dust mites are a microscopic type of insect that you cannot see with the naked eye. High levels of dust mites have been detected from mattresses, pillows, carpets, upholstered furniture, bed covers, clothes, soft toys and any woven material. The principal allergen of the dust mite is found in its feces. A gram of dust may contain 1,000 mites and 250,000 fecal particles. Mite antigen is easily measured in the air during house cleaning activities. Dust mites do not bite and do not cause harm to humans, other than by triggering allergies/asthma.  Ways to decrease your exposure to dust mites in your home:  1. Encase mattresses, box springs and pillows with a mite-impermeable barrier or cover  2. Wash sheets, blankets and drapes weekly in hot water (130 F) with detergent and dry them in a dryer on the hot setting.  3. Have the room cleaned frequently with a vacuum cleaner and a damp dust-mop. For carpeting or rugs, vacuuming with a vacuum cleaner equipped with a high-efficiency particulate air (HEPA) filter. The dust mite allergic individual should not be in a room which is being cleaned and should wait 1 hour after cleaning before going into the room.  4. Do not sleep on upholstered furniture (eg, couches).  5. If possible removing carpeting, upholstered furniture and drapery from the home is ideal. Horizontal blinds should be eliminated in the rooms where the person spends the most time (bedroom, study, television room). Washable vinyl, roller-type shades are optimal.  6. Remove all non-washable stuffed toys from the bedroom. Wash stuffed toys weekly like sheets and blankets above.  7. Reduce indoor humidity to less than 50%. Inexpensive humidity monitors can be purchased at most hardware stores. Do not use a humidifier as can make the problem worse and are not recommended.  Control of Mold  Allergen Mold and fungi can grow on a variety of surfaces provided certain temperature and moisture conditions exist.  Outdoor molds grow on plants, decaying vegetation and soil.  The major outdoor mold, Alternaria and Cladosporium, are found in very high numbers during hot and dry conditions.  Generally, a late Summer - Fall peak is seen for common outdoor fungal spores.  Rain will temporarily lower outdoor mold spore count, but counts rise rapidly when the rainy period ends.  The most important indoor molds are Aspergillus and Penicillium.  Dark, humid and poorly ventilated basements are ideal sites for mold growth.  The next most common sites of mold growth are the bathroom and the kitchen.  Outdoor [employer] Use air conditioning and keep windows closed Avoid exposure to decaying vegetation. Avoid leaf raking. Avoid grain handling. Consider wearing a face mask if working in moldy areas.  Indoor Mold Control Maintain humidity below 50%. Clean washable surfaces with 5% bleach solution. Remove sources e.g. Contaminated carpets.

## 2024-03-30 NOTE — Progress Notes (Signed)
 FOLLOW UP Date of Service/Encounter:   03/30/2024  Subjective:  Emily [name] (DOB: 1981/07/06) is a 43 y.o. female who [facility] in re-evaluation of the following: asthma, allergic rhinitis on AIT, alpha-gal allergy , multiple food intolerances, atopic dermatitis  History obtained from: chart review and patient.  For Review, LV was on 10/07/23  with Dr.Teneka Malmberg seen for routine follow-up. See below for summary of history and diagnostics.   Therapeutic plans/changes recommended: doing well on AIT, FEV1 95% ----------------------------------------------------- Pertinent History/Diagnostics:  Asthma: Initial visit 04/09/22: intially seen for chronic cough and SOB, worse after having COVID twice, following with pulmonary who sent for concern for UACS.  Responds to albuterol  and has hx of exercise induced asthma, but new symptoms worsening since Covid. Unable to tolerate ICS due to irritation of throat and weight gain.   S/p Bariatric surgery 07/2022.  - normal cardiac eval. Last echo 08/04/21  - normal spirometry (04/04/23): 91% FEV1 Allergic Rhinitis:  Uses flonase , can not tolerate AH due to perceived weight gain.  - SPT environmental panel (04/09/22): negative on skin, + intradermals to Dietrich, major and minor indoor and outdoor molds, cat, dog, and dust mites  - AIT started 04/09/22: receives 2 vials: (vial 1: C/D/DM/G and vial 2: molds) Food Allergy :  agave makes her vomit Seafood-both fish and shellfish, makes her stomach upset and feels lethargic or blah, no vomiting-occurs within 30 minutes of ingestion. She can eat, but smell makes her nauseous when being cooked. Lamb-upset stomach, nausea, within 15-20 minutes of eating Artificial sweeteners in general make her sick to her stomach. - SPT select foods (04/09/22): negative to top 8 most common allergens, fish, shellfish and lamb -sIgE labs 10/04/2022 positive to milk, pork, lamb, and  beef  - alpha gal 04/26/23: positive 1.44-avoiding artificial sweeteners and alpha-gal products Eczema: Occasional rash when exposed to pet danders.   Has triamcinolone  to use PRN Other: OSA with CPAP but unable to use due to coughing fits. S/P bariatric surgery 2023. --------------------------------------------------- Today presents for follow-up. Discussed the use of AI scribe software for clinical note transcription with the patient, who gave verbal consent to proceed.  History of Present Illness   Emily [name] is a 43 year old female with asthma and allergies who presents for a follow-up visit.  Asthma symptoms and management - Asthma is well-controlled with infrequent symptoms. - Rescue inhaler used approximately four to five times in the past month, primarily in relation to exercise. - Uses inhaler prophylactically before exercise. 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 - No recent exacerbations or hospitalizations.  Allergic reactions to immunotherapy - Currently receiving allergy  immunotherapy (allergy  shots). - Developed a red, splotchy, itchy rash resembling a burn within three hours after the last set of allergy  shots. - Rash has since improved without intervention. - Did not use triamcinolone  as the rash was not bothersome. - Uncertain if the reaction was related to a new vial or allergen exposure.  Food allergies and accidental exposures - Experienced one or two accidental exposures to artificial sweeteners and alpha-gal. - Symptoms included stomach pain and nausea, without severe or anaphylactic reactions. - Emphasizes strict food preparation due to risk of cross-contamination, especially after an incident on a cruise despite informing staff of her allergies.  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       All medications reviewed by clinical staff and updated in chart. No new pertinent medical or surgical history except as noted in HPI.  ROS: All others negative except as noted per HPI.   Objective:  BP 98/64 (BP Location: Right Arm, Patient Position: Sitting, Cuff Size: Normal)   Pulse 72   Temp 98.3 F (36.8 C) (Temporal)   Ht 5' 7.32 (1.71 m)   Wt 148 lb 14.4 oz (67.5 kg)   LMP 06/01/2015 (Within Days)   SpO2 98%   BMI 23.10 kg/m  Body mass index is 23.1 kg/m. Physical Exam: General Appearance:  Alert, cooperative, no distress, appears stated age  Head:  Normocephalic, without obvious abnormality, atraumatic  Eyes:  Conjunctiva clear, EOM's intact  Ears EACs normal bilaterally and normal TMs bilaterally  Nose: Nares normal, hypertrophic turbinates, normal mucosa, and no visible anterior polyps  Throat: Lips, tongue normal; teeth and gums normal, normal posterior oropharynx  Neck: Supple, symmetrical  Lungs:   clear to auscultation bilaterally, Respirations unlabored, no coughing  Heart:  regular rate and rhythm and no murmur, Appears well perfused  Extremities: No edema  Skin: Skin color, texture, turgor normal and no rashes or lesions on visualized portions of skin  Neurologic: No gross deficits   Labs:  Lab Orders  No laboratory test(s) ordered today    Assessment/Plan   Asthma-mild intermittent; at goal Continue albuterol  once every 4 hours as needed for cough or wheeze You may use albuterol  2 puffs 5-15 minutes before activity to decrease cough or wheeze For asthma flare, begin Pulmicort  0.5 mg via nebulizer twice a day to prevent cough or wheeze. You may stop in 2 weeks or until cough and wheeze free.   Allergic rhinitis; at goal on AIT Continue allergen avoidance measures directed toward pollen, mold, pet, and dust mite as listed below Continue Flonase  nasal spray 2 sprays in each nostril once a day as needed for stuffy  nose Continue ipratropium nasal spray 2 sprays in each nostril twice a day as needed for nasal symptoms Consider saline nasal rinses as needed for nasal symptoms. Use this before any medicated nasal sprays for best result Continue allergen immunotherapy per protocol and have access to an epinephrine  autoinjector set  Pruritus-resolved You can begin an antihistamine once a day as needed for itch. Some examples of over the counter antihistamines include Zyrtec (cetirizine), Xyzal (levocetirizine), Allegra (fexofenadine), and Claritin (loratidine).   Atopic dermatitis-at goal Continue a daily moisturizer twice a day For red and itchy areas below your face, begin triamcinolone  0.1% ointment up to twice a day as needed. Do not use this medication longer than 2 weeks in a row  Food allergy -stable Continue to avoid lamb, beef, pork, dairy products, artificial sweeteners, and agave.  In case of an allergic reaction, take Benadryl  50 mg every 4 hours, and if life-threatening symptoms occur, inject with EpiPen  0.3 mg. If your symptoms re-occur, begin a journal of events that occurred for up to 6 hours before your symptoms began including symptoms you experienced,foods and beverages consumed, and medications taken.  Call the clinic if this treatment plan is not working well for you.  Follow up in 6 months or sooner if needed.    Other: none  Rocky Endow, MD  [facility] 

## 2024-03-31 ENCOUNTER — Other Ambulatory Visit: Payer: Self-pay | Admitting: Nurse Practitioner

## 2024-04-02 NOTE — Telephone Encounter (Signed)
 Requested Prescriptions  Pending Prescriptions Disp Refills   buPROPion  (WELLBUTRIN  XL) 300 MG 24 hr tablet [Pharmacy Med Name: BUPROPION  HCL XL 300 MG TABLET] 90 tablet 0    Sig: Take 1 tablet (300 mg total) by mouth daily.     Psychiatry: Antidepressants - bupropion  Passed - 04/02/2024 11:43 AM      Passed - Cr in normal range and within 360 days    Creatinine, Ser  Date Value Ref Range Status  12/31/2023 0.84 0.57 - 1.00 mg/dL Final         Passed - AST in normal range and within 360 days    AST  Date Value Ref Range Status  12/31/2023 16 0 - 40 IU/L Final         Passed - ALT in normal range and within 360 days    ALT  Date Value Ref Range Status  12/31/2023 22 0 - 32 IU/L Final         Passed - Last BP in normal range    BP Readings from Last 1 Encounters:  03/30/24 98/64         Passed - Valid encounter within last 6 months    Recent Outpatient Visits           3 months ago Bipolar disorder in full remission, most recent episode unspecified type [facility])   [facility] Melvin Pao, NP       Future Appointments             In 2 months Hester Alm BROCKS, MD [facility]   In 6 months Marinda Rocky SAILOR, MD [place] [facility]

## 2024-04-15 ENCOUNTER — Ambulatory Visit (INDEPENDENT_AMBULATORY_CARE_PROVIDER_SITE_OTHER)

## 2024-04-15 DIAGNOSIS — J309 Allergic rhinitis, unspecified: Secondary | ICD-10-CM

## 2024-04-24 ENCOUNTER — Ambulatory Visit

## 2024-04-24 DIAGNOSIS — J309 Allergic rhinitis, unspecified: Secondary | ICD-10-CM

## 2024-04-29 ENCOUNTER — Encounter: Payer: Self-pay | Admitting: Skilled Nursing Facility1

## 2024-04-29 ENCOUNTER — Encounter: Attending: Surgery | Admitting: Skilled Nursing Facility1

## 2024-04-29 ENCOUNTER — Ambulatory Visit (INDEPENDENT_AMBULATORY_CARE_PROVIDER_SITE_OTHER)

## 2024-04-29 VITALS — Ht 68.0 in | Wt 148.6 lb

## 2024-04-29 DIAGNOSIS — E669 Obesity, unspecified: Secondary | ICD-10-CM | POA: Insufficient documentation

## 2024-04-29 DIAGNOSIS — J309 Allergic rhinitis, unspecified: Secondary | ICD-10-CM

## 2024-04-29 NOTE — Progress Notes (Signed)
 Bariatric Nutrition Follow-Up Visit Medical Nutrition Therapy   Appt time: 11:25-11:45  Surgery date: 08/14/2022 Surgery type: Sleeve Gastrectomy  Anthropometrics  Start weight at [facility]: 252 lbs (date: 09/11/2021)  Height: 68 in Weight today: 148.6 pounds   Clinical  Medical hx: sleep apnea, bipolar, IBS, Alpha Gal Medications: see list  Labs:  Notable signs/symptoms: none reported  Any previous deficiencies? Yes; vitamin D , vitamin B12, iron  Bowel Habits: Every day to every other day no complaints   Body Composition Scale 05/13/23 01/01/2024 02/13/2024 03/24/2024 04/29/2024  Current Body Weight  162.5 lb 137 139.6 146.1 148.6  Total Body Fat % 28.3 21.3 22.7 23.8 25.6  Visceral Fat 6 4 4 5 5   Fat-Free Mass % 71.6 78.6 77.2 76.1 74.3   Total Body Water % 50.3 53.8 53.1 52.5 51.6  Muscle-Mass lbs 34.2 33.4 33.2 33.9 33.2  BMI 24.6 20.6 21 22.1 22.4  Body Fat Displacement              Torso  lbs 28.4 18 19.5 21.5 23.4         Left Leg  lbs 5.6 3.6 3.9 4.3 4.6         Right Leg  lbs 5.6 3.6 3.9 4.3 4.6         Left Arm  lbs 2.8 1.8 1.9 2.1 2.3         Right Arm   lbs 2.8 1.8 1.9 2.1 2.3     Lifestyle & Dietary Hx  Pt states she is happy to be between 145-155 pounds.   Pt states her blood sugars have been good with the lowest being 79. Pt states she is working on adding in more types of foods including more beans and increasing her variety.  Pt states since taking the B1 she has been able to be a lot more focused.   Estimated daily fluid intake: 67 oz (water) + coffee/tea also Estimated daily protein intake: 70-80 g Supplements: multivitamin and states she cannot do the calcium because it tares her stomach up  Current average weekly physical activity: ADL's while working on correcting malnutrition   24-Hr Dietary Recall: 2-3 snacks per day First Meal: 2 eggs + cheese + bread with jam or nothing on it Snack:  fruit  Second Meal: mayo chicken salad + 6 ritz or saltines  crackers  Snack: protein bar (12 g usable CHO) Third Meal: caulifower crust pizza  Snack: grapes Beverages: 67.6 oz water, Gatorade zero, diluted juice, 24 oz coffee + almond creamer   Post-Op Goals/ Signs/ Symptoms: 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 and this has gotten better more recently  Using straws: no Drinking while eating: no Chewing/swallowing difficulties: no Changes in vision: no Changes to mood/headaches: no Hair loss/changes to skin/nails: some Difficulty focusing/concentrating: no Sweating: no Limb weakness: no Dizziness/lightheadedness: no Palpitations: no Carbonated/caffeinated beverages: no N/V/D/C/Gas: none reported  Abdominal pain: no Dumping syndrome: no    NUTRITION DIAGNOSIS  Inadequate dietary intake related to underconsumption of food as evidenced by moderate wasting, decreased muscle mass, weight loss, and fatigue.   NUTRITION INTERVENTION: continued plan Nutrition counseling (C-1) and education (E-2) to facilitate bariatric surgery goals, including: What is wasting and how does it appear on the body What does wasting feel like Increased fat and carbohydrate diet plan discussed and created for pt  Handouts Previously Provided Include  Bariatric MyPlate Maintenance Plan (1 Year)  Goals: -start your walks back but be  sure to enter activity very slowly to not get injured  Learning Style & Readiness for Change Teaching method utilized: Visual & Auditory  Demonstrated degree of understanding via: Teach Back  Readiness Level: Change in progress. Barriers to learning/adherence to lifestyle change: None identified  RD's Notes for Next Visit Assess adherence to pt chosen goals  MONITORING & EVALUATION Dietary intake, weekly physical activity, body weight.  Next Steps Patient is to follow-up in 6 weeks

## 2024-05-29 ENCOUNTER — Ambulatory Visit (INDEPENDENT_AMBULATORY_CARE_PROVIDER_SITE_OTHER)

## 2024-05-29 DIAGNOSIS — J309 Allergic rhinitis, unspecified: Secondary | ICD-10-CM

## 2024-06-03 ENCOUNTER — Ambulatory Visit (INDEPENDENT_AMBULATORY_CARE_PROVIDER_SITE_OTHER): Admitting: Nurse Practitioner

## 2024-06-03 ENCOUNTER — Encounter: Payer: Self-pay | Admitting: Nurse Practitioner

## 2024-06-03 VITALS — BP 101/67 | HR 78 | Temp 98.8°F | Ht 67.3 in | Wt 153.4 lb

## 2024-06-03 DIAGNOSIS — Z23 Encounter for immunization: Secondary | ICD-10-CM

## 2024-06-03 DIAGNOSIS — M25512 Pain in left shoulder: Secondary | ICD-10-CM

## 2024-06-03 MED ORDER — LIDOCAINE 4 % EX PTCH: 1.0000 | MEDICATED_PATCH | CUTANEOUS | 1 refills | Status: AC

## 2024-06-03 NOTE — Progress Notes (Signed)
 BP 101/67   Pulse 78   Temp 98.8 F (37.1 C) (Oral)   Ht 5' 7.3 (1.709 m)   Wt 153 lb 6.4 oz (69.6 kg)   LMP 06/01/2015 (Within Days)   SpO2 98%   BMI 23.81 kg/m    Subjective:    Patient ID: Emily [name], female    DOB: Feb 18, 1981, 43 y.o.   MRN: [id number]  HPI: Emily [name] is a 43 y.o. female  Chief Complaint  Patient presents with   Pain    Patient states she was hugged tightly by her husband and has been having pain in her L shoulder and arm area since then. States she felt a pop in the area and has had radiating pain since. States certain movements of her shoulder she is limited or unable to do since the accident.    Patient presents to clinic with complaints of pain in her upper left chest and down into her arm pit.  States it has also started to burn in her left shoulder.  She was hugged tightly on Sunday by her husband and heard a pop at that time.  Since then she has been having these symptoms. She has taken some tylenol  as needed for the pain but it isn't really helping.      Relevant past medical, surgical, family and social history reviewed and updated as indicated. Interim medical history since our last visit reviewed. Allergies and medications reviewed and updated.  Review of Systems  Musculoskeletal:        Left chest and shoulder pain    Per HPI unless specifically indicated above     Objective:    BP 101/67   Pulse 78   Temp 98.8 F (37.1 C) (Oral)   Ht 5' 7.3 (1.709 m)   Wt 153 lb 6.4 oz (69.6 kg)   LMP 06/01/2015 (Within Days)   SpO2 98%   BMI 23.81 kg/m   Wt Readings from Last 3 Encounters:  06/03/24 153 lb 6.4 oz (69.6 kg)  04/29/24 148 lb 9.6 oz (67.4 kg)  03/30/24 148 lb 14.4 oz (67.5 kg)    Physical Exam Vitals and nursing note reviewed.  Constitutional:      General: She is not in acute distress.    Appearance: Normal appearance. She is normal weight. She is not ill-appearing, toxic-appearing or diaphoretic.  HENT:      Head: Normocephalic.     Right Ear: External ear normal.     Left Ear: External ear normal.     Nose: Nose normal.     Mouth/Throat:     Mouth: Mucous membranes are moist.     Pharynx: Oropharynx is clear.  Eyes:     General:        Right eye: No discharge.        Left eye: No discharge.     Extraocular Movements: Extraocular movements intact.     Conjunctiva/sclera: Conjunctivae normal.     Pupils: Pupils are equal, round, and reactive to light.  Cardiovascular:     Rate and Rhythm: Normal rate and regular rhythm.     Heart sounds: No murmur heard. Pulmonary:     Effort: Pulmonary effort is normal. No respiratory distress.     Breath sounds: Normal breath sounds. No wheezing or rales.  Musculoskeletal:     Left shoulder: Tenderness present. No swelling, deformity, effusion, laceration, bony tenderness or crepitus. Decreased range of motion. Normal strength. Normal pulse.  Cervical back: Normal range of motion and neck supple.  Skin:    General: Skin is warm and dry.     Capillary Refill: Capillary refill takes less than 2 seconds.  Neurological:     General: No focal deficit present.     Mental Status: She is alert and oriented to person, place, and time. Mental status is at baseline.  Psychiatric:        Mood and Affect: Mood normal.        Behavior: Behavior normal.        Thought Content: Thought content normal.        Judgment: Judgment normal.     Results for orders placed or performed in visit on 12/31/23  Comprehensive metabolic panel with GFR   Collection Time: 12/31/23  8:30 AM  Result Value Ref Range   Glucose 76 70 - 99 mg/dL   BUN 15 6 - 24 mg/dL   Creatinine, Ser 9.15 0.57 - 1.00 mg/dL   eGFR 88 >40 fO/fpw/8.26   BUN/Creatinine Ratio 18 9 - 23   Sodium 140 134 - 144 mmol/L   Potassium 5.0 3.5 - 5.2 mmol/L   Chloride 101 96 - 106 mmol/L   CO2 26 20 - 29 mmol/L   Calcium 10.1 8.7 - 10.2 mg/dL   Total Protein 7.1 6.0 - 8.5 g/dL   Albumin 4.7 3.9 - 4.9  g/dL   Globulin, Total 2.4 1.5 - 4.5 g/dL   Bilirubin Total 1.1 0.0 - 1.2 mg/dL   Alkaline Phosphatase 80 44 - 121 IU/L   AST 16 0 - 40 IU/L   ALT 22 0 - 32 IU/L  Lipid panel   Collection Time: 12/31/23  8:30 AM  Result Value Ref Range   Cholesterol, Total 201 (H) 100 - 199 mg/dL   Triglycerides 56 0 - 149 mg/dL   HDL 79 >60 mg/dL   VLDL Cholesterol Cal 10 5 - 40 mg/dL   LDL Chol Calc (NIH) 887 (H) 0 - 99 mg/dL   Chol/HDL Ratio 2.5 0.0 - 4.4 ratio  B12   Collection Time: 12/31/23  8:30 AM  Result Value Ref Range   Vitamin B-12 462 232 - 1,245 pg/mL  VITAMIN D  25 Hydroxy (Vit-D Deficiency, Fractures)   Collection Time: 12/31/23  8:30 AM  Result Value Ref Range   Vit D, 25-Hydroxy 66.9 30.0 - 100.0 ng/mL      Assessment & Plan:   Problem List Items Addressed This Visit   None Visit Diagnoses       Acute pain of left shoulder    -  Primary   Recommend Lidocaine  patches, ice/ heat and tylenol  as needed for symptom management.  Follow up if not improved.     Need for influenza vaccination       Relevant Orders   Flu vaccine trivalent PF, 6mos and older(Flulaval,Afluria,Fluarix,Fluzone) (Completed)        Follow up plan: No follow-ups on file.

## 2024-06-10 ENCOUNTER — Encounter: Attending: Nurse Practitioner | Admitting: Skilled Nursing Facility1

## 2024-06-10 ENCOUNTER — Encounter: Payer: Self-pay | Admitting: Skilled Nursing Facility1

## 2024-06-10 DIAGNOSIS — E669 Obesity, unspecified: Secondary | ICD-10-CM | POA: Insufficient documentation

## 2024-06-10 NOTE — Progress Notes (Signed)
 Bariatric Nutrition Follow-Up Visit Medical Nutrition Therapy   Appt time: 10:44-11:00  Surgery date: 08/14/2022 Surgery type: Sleeve Gastrectomy  Anthropometrics  Start weight at [facility]: 252 lbs (date: 09/11/2021)  Height: 68 in Weight today: 152.1 pounds   Clinical  Medical hx: sleep apnea, bipolar, IBS, Alpha Gal Medications: see list  Labs:  Notable signs/symptoms: none reported  Any previous deficiencies? Yes; vitamin D , vitamin B12, iron  Bowel Habits: Every day to every other day no complaints   Body Composition Scale 01/01/2024 02/13/2024 03/24/2024 04/29/2024 06/10/2024  Current Body Weight 137 139.6 146.1 148.6 152.1  Total Body Fat % 21.3 22.7 23.8 25.6 27.4  Visceral Fat 4 4 5 5 5   Fat-Free Mass % 78.6 77.2 76.1 74.3 72.5   Total Body Water % 53.8 53.1 52.5 51.6 50.7  Muscle-Mass lbs 33.4 33.2 33.9 33.2 32.6  BMI 20.6 21 22.1 22.4 23.5  Body Fat Displacement              Torso  lbs 18 19.5 21.5 23.4 25.7         Left Leg  lbs 3.6 3.9 4.3 4.6 5.1         Right Leg  lbs 3.6 3.9 4.3 4.6 5.1         Left Arm  lbs 1.8 1.9 2.1 2.3 2.5         Right Arm   lbs 1.8 1.9 2.1 2.3 2.5     Lifestyle & Dietary Hx  Pt states she is happy to be between 145-155 pounds.   Pt arrives properly nourished and in recovery form malnutrition.   Pt states she has been training for the upcoming half marathon. Pt states she checks her blood sugars before she works out looking for 90-100 and keeps juice at the gym.  Pt states the lowest her blood sugar was 79 so no hypoglycemia but doe snot feel her best at 79. Pt states she will have some juice if she does not feel right.   Estimated daily fluid intake: 67 oz (water) + coffee/tea also Estimated daily protein intake: 70-80 g Supplements: ultra solo multivitamin and states she cannot do the calcium because it tares her stomach up, b1, biotin  Current average weekly physical activity: 2-3 days a week 30 minutes cardio and 30 minutes weight  training   24-Hr Dietary Recall: 2-3 snacks per day First Meal: 1 egg 1 [place] sausage and 1 piece of bread with strawberry jelly Snack:  veggie and hummus Second Meal: mayo chicken salad + 6 ritz or saltines crackers  Snack: 1 orange (taking an hour to eat) Third Meal: bean stew with [place] and sausage  Snack:  Beverages: 67.6 oz water, Gatorade zero, 100% fruit juice (and then drinks water after), 24 oz coffee + almond creamer   Post-Op Goals/ Signs/ Symptoms: 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 and this has gotten better more recently  Using straws: no Drinking while eating: no Chewing/swallowing difficulties: no Changes in vision: no Changes to mood/headaches: no Hair loss/changes to skin/nails: some Difficulty focusing/concentrating: no Sweating: no Limb weakness: no Dizziness/lightheadedness: no Palpitations: no Carbonated/caffeinated beverages: no N/V/D/C/Gas: none reported  Abdominal pain: no Dumping syndrome: no    NUTRITION DIAGNOSIS  Inadequate dietary intake related to underconsumption of food as evidenced by moderate wasting, decreased muscle mass, weight loss, and fatigue.   NUTRITION INTERVENTION: continued plan Nutrition counseling (C-1) and education (E-2) to facilitate bariatric surgery goals, including:  Weight and health maintenance on the journey of recovery from malnutrition   Handouts Previously Provided Include  Bariatric MyPlate Maintenance Plan (1 Year)   Learning Style & Readiness for Change Teaching method utilized: Visual & Auditory  Demonstrated degree of understanding via: Teach Back  Readiness Level: Change in progress. Barriers to learning/adherence to lifestyle change: None identified  RD's Notes for Next Visit Assess adherence to pt chosen goals  MONITORING & EVALUATION Dietary intake, weekly physical activity, body weight.  Next Steps Patient is to follow-up in 6 months

## 2024-06-24 ENCOUNTER — Ambulatory Visit: Payer: Commercial Managed Care - PPO | Admitting: Dermatology

## 2024-06-24 DIAGNOSIS — D229 Melanocytic nevi, unspecified: Secondary | ICD-10-CM

## 2024-06-24 DIAGNOSIS — L814 Other melanin hyperpigmentation: Secondary | ICD-10-CM

## 2024-06-24 DIAGNOSIS — Z86018 Personal history of other benign neoplasm: Secondary | ICD-10-CM

## 2024-06-24 DIAGNOSIS — D492 Neoplasm of unspecified behavior of bone, soft tissue, and skin: Secondary | ICD-10-CM | POA: Diagnosis not present

## 2024-06-24 DIAGNOSIS — Z1283 Encounter for screening for malignant neoplasm of skin: Secondary | ICD-10-CM | POA: Diagnosis not present

## 2024-06-24 DIAGNOSIS — L578 Other skin changes due to chronic exposure to nonionizing radiation: Secondary | ICD-10-CM | POA: Diagnosis not present

## 2024-06-24 DIAGNOSIS — D2371 Other benign neoplasm of skin of right lower limb, including hip: Secondary | ICD-10-CM

## 2024-06-24 DIAGNOSIS — W908XXA Exposure to other nonionizing radiation, initial encounter: Secondary | ICD-10-CM

## 2024-06-24 DIAGNOSIS — D239 Other benign neoplasm of skin, unspecified: Secondary | ICD-10-CM

## 2024-06-24 DIAGNOSIS — Z7189 Other specified counseling: Secondary | ICD-10-CM

## 2024-06-24 DIAGNOSIS — L821 Other seborrheic keratosis: Secondary | ICD-10-CM

## 2024-06-24 NOTE — Patient Instructions (Signed)

## 2024-06-24 NOTE — Progress Notes (Unsigned)
 Follow-Up Visit   Subjective  Emily [name] is a 43 y.o. female who presents for the following: Skin Cancer Screening and Full Body Skin Exam. HxDysplastic nevus, place at back present for a while, noticed some bumps around it, scab some itchiness.   The patient presents for Total-Body Skin Exam (TBSE) for skin cancer screening and mole check. The patient has spots, moles and lesions to be evaluated, some may be new or changing and the patient may have concern these could be cancer.  The following portions of the chart were reviewed this encounter and updated as appropriate: medications, allergies, medical history  Review of Systems:  No other skin or systemic complaints except as noted in HPI or Assessment and Plan.  Objective  Well appearing patient in no apparent distress; mood and affect are within normal limits.  A full examination was performed including scalp, head, eyes, ears, nose, lips, neck, chest, axillae, abdomen, back, buttocks, bilateral upper extremities, bilateral lower extremities, hands, feet, fingers, toes, fingernails, and toenails. All findings within normal limits unless otherwise noted below.   Relevant physical exam findings are noted in the Assessment and Plan.  Left lateral foot    Left lateral foot   Right foot   Right medial foot   Right medial foot   Left upper back paraspinal  Left lower back lateral to spine 4 cm    Assessment & Plan   SKIN CANCER SCREENING PERFORMED TODAY.  ACTINIC DAMAGE - Chronic condition, secondary to cumulative UV/sun exposure - diffuse scaly erythematous macules with underlying dyspigmentation - Recommend daily broad spectrum sunscreen SPF 30+ to sun-exposed areas, reapply every 2 hours as needed.  - Staying in the shade or wearing long sleeves, sun glasses (UVA+UVB protection) and wide brim hats (4-inch brim around the entire circumference of the hat) are also recommended for sun protection.  - Call for new  or changing lesions.  LENTIGINES, SEBORRHEIC KERATOSES, HEMANGIOMAS - Benign normal skin lesions - Benign-appearing - Call for any changes  MELANOCYTIC NEVI - Tan-brown and/or pink-flesh-colored symmetric macules and papules - Benign appearing on exam today - Observation - Call clinic for new or changing moles - Recommend daily use of broad spectrum spf 30+ sunscreen to sun-exposed areas.   SEBORRHEIC KERATOSIS - Stuck-on, waxy, tan-brown papules and/or plaques at back. - Benign-appearing - Discussed benign etiology and prognosis. - Observe - Call for any changes  HISTORY OF DYSPLASTIC NEVUS Left upper back paraspinal, moderate atypia- 04/17/2023 Left lateral foot, moderate atypia- 06/20/2023. New photo taken today. No evidence of recurrence today Recommend regular full body skin exams Recommend daily broad spectrum sunscreen SPF 30+ to sun-exposed areas, reapply every 2 hours as needed.  Call if any new or changing lesions are noted between office visits  DERMATOFIBROMA Exam: Firm pink/brown papulenodule with dimple sign at right posterior leg and right thigh. Treatment Plan: A dermatofibroma is a benign growth possibly related to trauma, such as an insect bite, cut from shaving, or inflamed acne-type bump.  Treatment options to remove include shave or excision with resulting scar and risk of recurrence.  Since benign-appearing and not bothersome, will observe for now.    NEOPLASM OF SKIN (3) Left upper back paraspinal Left lower back lateral to spine Right middle buttocks Patient does have upcoming marathon run at the end of October, concerned healing time and prefers to come back in November for biopsies. Photos taken today.   Return in about 1 month (around 07/25/2024) for biopsies x3, w/  Dr. Hester.  I, Jacquelynn V. Wilfred, CMA, am acting as scribe for Alm Hester, MD.   Documentation: I have reviewed the above documentation for accuracy and completeness, and I  agree with the above.  Alm Hester, MD

## 2024-06-25 ENCOUNTER — Encounter: Payer: Self-pay | Admitting: Dermatology

## 2024-06-29 ENCOUNTER — Ambulatory Visit (INDEPENDENT_AMBULATORY_CARE_PROVIDER_SITE_OTHER)

## 2024-06-29 DIAGNOSIS — J309 Allergic rhinitis, unspecified: Secondary | ICD-10-CM | POA: Diagnosis not present

## 2024-07-01 ENCOUNTER — Other Ambulatory Visit: Payer: Self-pay | Admitting: Nurse Practitioner

## 2024-07-02 NOTE — Telephone Encounter (Signed)
 Requested Prescriptions  Pending Prescriptions Disp Refills   buPROPion  (WELLBUTRIN  XL) 300 MG 24 hr tablet [Pharmacy Med Name: BUPROPION  HCL XL 300 MG TABLET] 90 tablet 0    Sig: Take 1 tablet (300 mg total) by mouth daily.     Psychiatry: Antidepressants - bupropion  Failed - 07/02/2024  3:41 PM      Failed - Valid encounter within last 6 months    Recent Outpatient Visits           4 weeks ago Acute pain of left shoulder   [place] [address] [facility] Melvin Pao, NP   6 months ago Bipolar disorder in full remission, most recent episode unspecified type   [place] [facility] Melvin Pao, NP       Future Appointments             In 4 weeks Hester Alm BROCKS, MD [facility]   In 3 months Marinda Rocky SAILOR, MD [facility] - Cr in normal range and within 360 days    Creatinine, Ser  Date Value Ref Range Status  12/31/2023 0.84 0.57 - 1.00 mg/dL Final         Passed - AST in normal range and within 360 days    AST  Date Value Ref Range Status  12/31/2023 16 0 - 40 IU/L Final         Passed - ALT in normal range and within 360 days    ALT  Date Value Ref Range Status  12/31/2023 22 0 - 32 IU/L Final         Passed - Last BP in normal range    BP Readings from Last 1 Encounters:  06/03/24 101/67

## 2024-07-03 ENCOUNTER — Ambulatory Visit: Admitting: Nurse Practitioner

## 2024-07-07 ENCOUNTER — Encounter: Payer: Self-pay | Admitting: Nurse Practitioner

## 2024-07-07 ENCOUNTER — Ambulatory Visit (INDEPENDENT_AMBULATORY_CARE_PROVIDER_SITE_OTHER): Admitting: Nurse Practitioner

## 2024-07-07 VITALS — BP 121/69 | HR 70 | Temp 98.1°F | Ht 67.7 in | Wt 154.0 lb

## 2024-07-07 DIAGNOSIS — F3170 Bipolar disorder, currently in remission, most recent episode unspecified: Secondary | ICD-10-CM

## 2024-07-07 DIAGNOSIS — I2721 Secondary pulmonary arterial hypertension: Secondary | ICD-10-CM

## 2024-07-07 DIAGNOSIS — Z1231 Encounter for screening mammogram for malignant neoplasm of breast: Secondary | ICD-10-CM

## 2024-07-07 DIAGNOSIS — Z9884 Bariatric surgery status: Secondary | ICD-10-CM | POA: Diagnosis not present

## 2024-07-07 DIAGNOSIS — E538 Deficiency of other specified B group vitamins: Secondary | ICD-10-CM

## 2024-07-07 DIAGNOSIS — J849 Interstitial pulmonary disease, unspecified: Secondary | ICD-10-CM

## 2024-07-07 DIAGNOSIS — Z0000 Encounter for general adult medical examination without abnormal findings: Secondary | ICD-10-CM | POA: Diagnosis not present

## 2024-07-07 DIAGNOSIS — E559 Vitamin D deficiency, unspecified: Secondary | ICD-10-CM

## 2024-07-07 MED ORDER — SCOPOLAMINE 1 MG/3DAYS TD PT72: 1.0000 | MEDICATED_PATCH | TRANSDERMAL | 12 refills | Status: AC

## 2024-07-07 MED ORDER — BUPROPION HCL ER (XL) 300 MG PO TB24: 300.0000 mg | ORAL_TABLET | Freq: Every day | ORAL | 0 refills | Status: AC

## 2024-07-07 MED ORDER — ALBUTEROL SULFATE HFA 108 (90 BASE) MCG/ACT IN AERS: 1.0000 | INHALATION_SPRAY | Freq: Four times a day (QID) | RESPIRATORY_TRACT | 1 refills | Status: DC | PRN

## 2024-07-07 NOTE — Assessment & Plan Note (Signed)
 Chronic.  Controlled.  Continue with current medication regimen.  Labs ordered today.  Return to clinic in 6 months for reevaluation.  Call sooner if concerns arise.  ? ?

## 2024-07-07 NOTE — Assessment & Plan Note (Signed)
Chronic. Followed by Pulmonology.  Continue to follow per their recommendations. 

## 2024-07-07 NOTE — Progress Notes (Signed)
 BP 121/69   Pulse 70   Temp 98.1 F (36.7 C) (Oral)   Ht 5' 7.7 (1.72 m)   Wt 154 lb (69.9 kg)   LMP 06/01/2015 (Within Days)   SpO2 98%   BMI 23.62 kg/m    Subjective:    Patient ID: Emily [name], female    DOB: 04/06/1981, 43 y.o.   MRN: [id number]  HPI: Emily [name] is a 43 y.o. female presenting on 07/07/2024 for comprehensive medical examination. Current medical complaints include:none  She currently lives with: Menopausal Symptoms: no  MOOD Patient states she feels like her mood has been good.  She is getting the wellbutrin  manufacturer was changed and preferred the other dose.  She does have her ups and downs but they aren't as severe as they used to be.  She feels like she is doing very well with her current medication regimen.  Feels like these are good doses for her.    Flowsheet Row Office Visit from 07/07/2024 in [facility]  PHQ-9 Total Score 0      07/07/2024    8:36 AM 12/31/2023    8:13 AM 07/02/2023    9:25 AM 06/10/2023   10:34 AM  GAD 7 : Generalized Anxiety Score  Nervous, Anxious, on Edge 0 0 0 0  Control/stop worrying 0 0 0 0  Worry too much - different things 0 0 0 0  Trouble relaxing 0 0 0 0  Restless 0 0 0 0  Easily annoyed or irritable 0 0 0 1  Afraid - awful might happen 0 0 0 0  Total GAD 7 Score 0 0 0 1  Anxiety Difficulty Not difficult at all Not difficult at all Not difficult at all Not difficult at all    POST GASTRIC SLEEVE Patient states she is feeling great.  She is almost 2 years out from her surgery.  She is trying to maintain her weight.  She is training for a half marathon.  Patient is having numbness and tingling and a lot of fatigue.  She is currently trying to stay at this current weight while training for the half marathon.  Depression Screen done today and results listed below:     07/07/2024    8:35 AM 12/31/2023    8:13 AM 07/02/2023    9:24 AM 06/10/2023   10:34 AM 12/31/2022    9:17 AM   Depression screen PHQ 2/9  Decreased Interest 0 0 0 0 0  Down, Depressed, Hopeless 0 0 0 1 0  PHQ - 2 Score 0 0 0 1 0  Altered sleeping 0 1 1 0 1  Tired, decreased energy 0 2 0 0 1  Change in appetite 0 0 0 0 0  Feeling bad or failure about yourself  0 0 0 0 0  Trouble concentrating 0 0 0 0 0  Moving slowly or fidgety/restless 0 0 0 0 0  Suicidal thoughts 0 0 0 0 0  PHQ-9 Score 0 3 1 1 2   Difficult doing work/chores Not difficult at all Not difficult at all Not difficult at all Not difficult at all Not difficult at all    The patient does not have a history of falls. I did complete a risk assessment for falls. A plan of care for falls was documented.   Past Medical History:  Past Medical History:  Diagnosis Date   Allergy     Anemia    Asthma    sports  induced inhalers   Bipolar 1 disorder (HCC)    Conversion disorder    Depression    Dysplastic nevus 04/17/2023   L upper back paraspinal, moderate atypia   Dysplastic nevus 06/20/2023   L lat foot - moderate   History of kidney stones    Insomnia    Migraines    migraines   Morbid obesity (HCC) 07/03/2016   MRSA infection 2010   Ovarian cyst    left 2.5cm   Sleep apnea    has cpap   Vitamin B 12 deficiency    Vitamin D  deficiency     Surgical History:  Past Surgical History:  Procedure Laterality Date   ABDOMINAL HYSTERECTOMY     BARIATRIC SURGERY  08/14/2022   BUNIONECTOMY     ESOPHAGOGASTRODUODENOSCOPY (EGD) WITH PROPOFOL  N/A 11/03/2018   Procedure: ESOPHAGOGASTRODUODENOSCOPY (EGD) WITH PROPOFOL ;  Surgeon: Therisa Bi, MD;  Location: [facility];  Service: Gastroenterology;  Laterality: N/A;   EYE SURGERY     HAMMER TOE SURGERY     HAND SURGERY  09/24/2012   cyst removed   INTRAUTERINE DEVICE (IUD) INSERTION N/A 05/09/2015   Procedure: INTRAUTERINE DEVICE (IUD) INSERTION;  Surgeon: Archie Savers, MD;  Location: [facility] ORS;  Service: Gynecology;  Laterality: N/A;   LAPAROSCOPIC ASSISTED VAGINAL  HYSTERECTOMY N/A 06/20/2015   Procedure: LAPAROSCOPIC ASSISTED VAGINAL HYSTERECTOMY, BILATERAL SALPINGECTOMY, CYSTOSCOPY, IUD REMOVAL ;  Surgeon: Archie Savers, MD;  Location: [facility] ORS;  Service: Gynecology;  Laterality: N/A;   LAPAROSCOPIC TUBAL LIGATION Bilateral 05/09/2015   Procedure: LAPAROSCOPIC TUBAL LIGATION;  Surgeon: Archie Savers, MD;  Location: [facility] ORS;  Service: Gynecology;  Laterality: Bilateral;   NASAL SINUS SURGERY     TOTAL ABDOMINAL HYSTERECTOMY W/ BILATERAL SALPINGOOPHORECTOMY     TOTAL ABDOMINAL HYSTERECTOMY W/ BILATERAL SALPINGOOPHORECTOMY     TUBAL LIGATION  09/24/2005   UPPER GI ENDOSCOPY N/A 08/14/2022   Procedure: UPPER GI ENDOSCOPY;  Surgeon: Gladis Cough, MD;  Location: [facility] ORS;  Service: General;  Laterality: N/A;    Medications:  Current Outpatient Medications on File Prior to Visit  Medication Sig   albuterol  (PROVENTIL ) (2.5 MG/3ML) 0.083% nebulizer solution Take 3 mLs (2.5 mg total) by nebulization every 4 (four) hours as needed for wheezing or shortness of breath.   EPINEPHrine  0.3 mg/0.3 mL IJ SOAJ injection Inject 0.3 mg into the muscle as needed for anaphylaxis.   fluticasone  (FLONASE ) 50 MCG/ACT nasal spray Place 2 sprays into both nostrils daily as needed for allergies or rhinitis.   lidocaine  (HM LIDOCAINE  PATCH) 4 % Place 1 patch onto the skin daily.   Multiple Vitamins-Minerals (ULTRA MULTI FORMULA/IRON) CAPS Take 3 capsules by mouth daily.   scopolamine  (TRANSDERM-SCOP) 1 MG/3DAYS Place 1 patch (1.5 mg total) onto the skin every 3 (three) days.   SUMAtriptan  (IMITREX ) 25 MG tablet Take 1 tablet (25 mg total) by mouth every 2 (two) hours as needed for migraine. May repeat in 2 hours if headache persists or recurs.   triamcinolone  ointment (KENALOG ) 0.1 % Apply topically twice daily to BODY as needed for red, sandpaper like rash.  Do not use on face, groin or armpits.   No current facility-administered medications on file prior to visit.     Allergies:  Allergies  Allergen Reactions   Codeine Diarrhea and Nausea And Vomiting    Social History:  Social History   Socioeconomic History   Marital status: Married    Spouse name: Not on file   Number of children: Not on file  Years of education: Not on file   Highest education level: Associate degree: occupational, technical, or vocational program  Occupational History   Not on file  Tobacco Use   Smoking status: Former    Current packs/day: 0.00    Average packs/day: 1 pack/day for 10.0 years (10.0 ttl pk-yrs)    Types: Cigarettes    Start date: 01/29/1994    Quit date: 01/30/2004    Years since quitting: 20.4   Smokeless tobacco: Never  Vaping Use   Vaping status: Never Used  Substance and Sexual Activity   Alcohol use: Not Currently   Drug use: No   Sexual activity: Yes    Birth control/protection: Surgical  Other Topics Concern   Not on file  Social History Narrative   Not on file   Social Drivers of Health   Financial Resource Strain: Low Risk  (07/05/2024)   Overall Financial Resource Strain (CARDIA)    Difficulty of Paying Living Expenses: Not hard at all  Food Insecurity: No Food Insecurity (07/05/2024)   Hunger Vital Sign    Worried About Running Out of Food in the Last Year: Never true    Ran Out of Food in the Last Year: Never true  Transportation Needs: No Transportation Needs (07/05/2024)   PRAPARE - Administrator, Civil Service (Medical): No    Lack of Transportation (Non-Medical): No  Physical Activity: Sufficiently Active (07/05/2024)   Exercise Vital Sign    Days of Exercise per Week: 5 days    Minutes of Exercise per Session: 90 min  Stress: No Stress Concern Present (07/05/2024)   [employer] of Occupational Health - Occupational Stress Questionnaire    Feeling of Stress: Only a little  Social Connections: Moderately Isolated (07/05/2024)   Social Connection and Isolation Panel    Frequency of Communication  with Friends and Family: More than three times a week    Frequency of Social Gatherings with Friends and Family: Twice a week    Attends Religious Services: Never    Database administrator or Organizations: No    Attends Engineer, structural: Not on file    Marital Status: Married  Catering manager Violence: Not At Risk (07/07/2024)   Humiliation, Afraid, Rape, and Kick questionnaire    Fear of Current or Ex-Partner: No    Emotionally Abused: No    Physically Abused: No    Sexually Abused: No   Social History   Tobacco Use  Smoking Status Former   Current packs/day: 0.00   Average packs/day: 1 pack/day for 10.0 years (10.0 ttl pk-yrs)   Types: Cigarettes   Start date: 01/29/1994   Quit date: 01/30/2004   Years since quitting: 20.4  Smokeless Tobacco Never   Social History   Substance and Sexual Activity  Alcohol Use Not Currently    Family History:  Family History  Problem Relation Age of Onset   Bipolar disorder Mother    Anxiety disorder Mother    Cancer Mother        brain   Glaucoma Mother    Anemia Mother    Heart disease Father    Diabetes Father    Cancer Father    Heart attack Father        Died Aug 13, 2008 of a heart attack   Bipolar disorder Sister    Schizophrenia Sister    Anemia Sister    Breast cancer Brother    Heart attack Brother    Breast cancer Maternal  Aunt    Cancer Maternal Grandmother        lung   Heart disease Maternal Grandfather    Ovarian cancer Neg Hx     Past medical history, surgical history, medications, allergies, family history and social history reviewed with patient today and changes made to appropriate areas of the chart.   Review of Systems  Psychiatric/Behavioral:  Positive for depression. The patient is nervous/anxious.    All other ROS negative except what is listed above and in the HPI.      Objective:    BP 121/69   Pulse 70   Temp 98.1 F (36.7 C) (Oral)   Ht 5' 7.7 (1.72 m)   Wt 154 lb (69.9 kg)   LMP  06/01/2015 (Within Days)   SpO2 98%   BMI 23.62 kg/m   Wt Readings from Last 3 Encounters:  07/07/24 154 lb (69.9 kg)  06/03/24 153 lb 6.4 oz (69.6 kg)  04/29/24 148 lb 9.6 oz (67.4 kg)    Physical Exam Vitals and nursing note reviewed.  Constitutional:      General: She is awake. She is not in acute distress.    Appearance: Normal appearance. She is well-developed. She is not ill-appearing.  HENT:     Head: Normocephalic and atraumatic.     Right Ear: Hearing, tympanic membrane, ear canal and external ear normal. No drainage.     Left Ear: Hearing, tympanic membrane, ear canal and external ear normal. No drainage.     Nose: Nose normal.     Right Sinus: No maxillary sinus tenderness or frontal sinus tenderness.     Left Sinus: No maxillary sinus tenderness or frontal sinus tenderness.     Mouth/Throat:     Mouth: Mucous membranes are moist.     Pharynx: Oropharynx is clear. Uvula midline. No pharyngeal swelling, oropharyngeal exudate or posterior oropharyngeal erythema.  Eyes:     General: Lids are normal.        Right eye: No discharge.        Left eye: No discharge.     Extraocular Movements: Extraocular movements intact.     Conjunctiva/sclera: Conjunctivae normal.     Pupils: Pupils are equal, round, and reactive to light.     Visual Fields: Right eye visual fields normal and left eye visual fields normal.  Neck:     Thyroid : No thyromegaly.     Vascular: No carotid bruit.     Trachea: Trachea normal.  Cardiovascular:     Rate and Rhythm: Normal rate and regular rhythm.     Heart sounds: Normal heart sounds. No murmur heard.    No gallop.  Pulmonary:     Effort: Pulmonary effort is normal. No accessory muscle usage or respiratory distress.     Breath sounds: Normal breath sounds.  Chest:  Breasts:    Right: Normal.     Left: Normal.  Abdominal:     General: Bowel sounds are normal.     Palpations: Abdomen is soft. There is no hepatomegaly or splenomegaly.      Tenderness: There is no abdominal tenderness.  Musculoskeletal:        General: Normal range of motion.     Cervical back: Normal range of motion and neck supple.     Right lower leg: No edema.     Left lower leg: No edema.  Lymphadenopathy:     Head:     Right side of head: No submental, submandibular, tonsillar, preauricular or  posterior auricular adenopathy.     Left side of head: No submental, submandibular, tonsillar, preauricular or posterior auricular adenopathy.     Cervical: No cervical adenopathy.     Upper Body:     Right upper body: No supraclavicular, axillary or pectoral adenopathy.     Left upper body: No supraclavicular, axillary or pectoral adenopathy.  Skin:    General: Skin is warm and dry.     Capillary Refill: Capillary refill takes less than 2 seconds.     Findings: No rash.  Neurological:     Mental Status: She is alert and oriented to person, place, and time.     Gait: Gait is intact.  Psychiatric:        Attention and Perception: Attention normal.        Mood and Affect: Mood normal.        Speech: Speech normal.        Behavior: Behavior normal. Behavior is cooperative.        Thought Content: Thought content normal.        Judgment: Judgment normal.     Results for orders placed or performed in visit on 12/31/23  Comprehensive metabolic panel with GFR   Collection Time: 12/31/23  8:30 AM  Result Value Ref Range   Glucose 76 70 - 99 mg/dL   BUN 15 6 - 24 mg/dL   Creatinine, Ser 9.15 0.57 - 1.00 mg/dL   eGFR 88 >40 fO/fpw/8.26   BUN/Creatinine Ratio 18 9 - 23   Sodium 140 134 - 144 mmol/L   Potassium 5.0 3.5 - 5.2 mmol/L   Chloride 101 96 - 106 mmol/L   CO2 26 20 - 29 mmol/L   Calcium 10.1 8.7 - 10.2 mg/dL   Total Protein 7.1 6.0 - 8.5 g/dL   Albumin 4.7 3.9 - 4.9 g/dL   Globulin, Total 2.4 1.5 - 4.5 g/dL   Bilirubin Total 1.1 0.0 - 1.2 mg/dL   Alkaline Phosphatase 80 44 - 121 IU/L   AST 16 0 - 40 IU/L   ALT 22 0 - 32 IU/L  Lipid panel    Collection Time: 12/31/23  8:30 AM  Result Value Ref Range   Cholesterol, Total 201 (H) 100 - 199 mg/dL   Triglycerides 56 0 - 149 mg/dL   HDL 79 >60 mg/dL   VLDL Cholesterol Cal 10 5 - 40 mg/dL   LDL Chol Calc (NIH) 887 (H) 0 - 99 mg/dL   Chol/HDL Ratio 2.5 0.0 - 4.4 ratio  B12   Collection Time: 12/31/23  8:30 AM  Result Value Ref Range   Vitamin B-12 462 232 - 1,245 pg/mL  VITAMIN D  25 Hydroxy (Vit-D Deficiency, Fractures)   Collection Time: 12/31/23  8:30 AM  Result Value Ref Range   Vit D, 25-Hydroxy 66.9 30.0 - 100.0 ng/mL      Assessment & Plan:   Problem List Items Addressed This Visit       Cardiovascular and Mediastinum   Pulmonary artery hypertension (HCC)   Chronic.  Controlled.  Continue with current medication regimen.  Labs ordered today.  Return to clinic in 6 months for reevaluation.  Call sooner if concerns arise.       Relevant Orders   Comp Met (CMET)   B12   Vitamin D  (25 hydroxy)   CBC w/Diff     Respiratory   Interstitial pulmonary disease (HCC)   Chronic. Followed by Pulmonology. Continue to follow per their recommendations.  Relevant Orders   Comp Met (CMET)   B12   Vitamin D  (25 hydroxy)   CBC w/Diff     Other   Bipolar disorder (HCC)   Chronic.  Controlled.  Continue with current medication regimen of Wellbutrin  daily.  Refills sent today.  Labs ordered today.  Return to clinic in 6 months for reevaluation.  Call sooner if concerns arise.       Relevant Orders   Comp Met (CMET)   B12   Vitamin D  (25 hydroxy)   CBC w/Diff   Vitamin B12 deficiency   Chronic.  Controlled.  Continue with current medication regimen.  Labs ordered today.  Return to clinic in 6 months for reevaluation.  Call sooner if concerns arise.        Relevant Orders   B12   Vitamin D  deficiency   Chronic.  Controlled.  Continue with current medication regimen.  Labs ordered today.  Return to clinic in 6 months for reevaluation.  Call sooner if concerns  arise.        Relevant Orders   Vitamin D  (25 hydroxy)   S/P laparoscopic sleeve gastrectomy   Chronic.  About 2 years out from surgery.  Goal is to stay between 145-155lbs. Training for a half marathon currently.      Relevant Orders   Comp Met (CMET)   B12   Vitamin D  (25 hydroxy)   CBC w/Diff   Other Visit Diagnoses       Annual physical exam    -  Primary   Health maintenance reviewed during visit today.  Labs ordered.  Vaccines reviewed.  Mammogram ordered.   Relevant Orders   TSH   Lipid Profile     Encounter for screening mammogram for malignant neoplasm of breast       Relevant Orders   MM 3D SCREENING MAMMOGRAM BILATERAL BREAST        Follow up plan: Return in about 6 months (around 01/05/2025) for HTN, HLD, DM2 FU.   LABORATORY TESTING:  - Pap smear: not applicable  IMMUNIZATIONS:   - Tdap: Tetanus vaccination status reviewed: last tetanus booster within 10 years. - Influenza: Up to date - Pneumovax: Refused - Prevnar: Refused - COVID: Refused - HPV: Not applicable - Shingrix vaccine: Not applicable  SCREENING: -Mammogram: Ordered today  - Colonoscopy: Not applicable  - Bone Density: Not applicable  -Hearing Test: Not applicable  -Spirometry: Not applicable   PATIENT COUNSELING:   Advised to take 1 mg of folate supplement per day if capable of pregnancy.   Sexuality: Discussed sexually transmitted diseases, partner selection, use of condoms, avoidance of unintended pregnancy  and contraceptive alternatives.   Advised to avoid cigarette smoking.  I discussed with the patient that most people either abstain from alcohol or drink within safe limits (<=14/week and <=4 drinks/occasion for males, <=7/weeks and <= 3 drinks/occasion for females) and that the risk for alcohol disorders and other health effects rises proportionally with the number of drinks per week and how often a drinker exceeds daily limits.  Discussed cessation/primary prevention of  drug use and availability of treatment for abuse.   Diet: Encouraged to adjust caloric intake to maintain  or achieve ideal body weight, to reduce intake of dietary saturated fat and total fat, to limit sodium intake by avoiding high sodium foods and not adding table salt, and to maintain adequate dietary potassium and calcium preferably from fresh fruits, vegetables, and low-fat dairy products.    stressed the  importance of regular exercise  Injury prevention: Discussed safety belts, safety helmets, smoke detector, smoking near bedding or upholstery.   Dental health: Discussed importance of regular tooth brushing, flossing, and dental visits.    NEXT PREVENTATIVE PHYSICAL DUE IN 1 YEAR. Return in about 6 months (around 01/05/2025) for HTN, HLD, DM2 FU.

## 2024-07-07 NOTE — Progress Notes (Deleted)
 LMP 06/01/2015 (Within Days)    Subjective:    Patient ID: Emily [name], female    DOB: 10-20-1980, 43 y.o.   MRN: [id number]  HPI: Emily [name] is a 43 y.o. female  No chief complaint on file.  MOOD Patient states she feels like her mood has been good.  She is getting the wellbutrin  manufacturer was changed and preferred the other dose.  She feels like she is doing very well with her current medication regimen.  Feels like these are good doses for her.    Flowsheet Row Office Visit from 12/31/2023 in [facility]  PHQ-9 Total Score 3      12/31/2023    8:13 AM 07/02/2023    9:25 AM 06/10/2023   10:34 AM 12/31/2022    9:18 AM  GAD 7 : Generalized Anxiety Score  Nervous, Anxious, on Edge 0 0 0 0  Control/stop worrying 0 0 0 0  Worry too much - different things 0 0 0 0  Trouble relaxing 0 0 0 1  Restless 0 0 0 1  Easily annoyed or irritable 0 0 1 1  Afraid - awful might happen 0 0 0 0  Total GAD 7 Score 0 0 1 3  Anxiety Difficulty Not difficult at all Not difficult at all Not difficult at all Not difficult at all    POST GASTRIC SLEEVE Patient states she is feeling great.  She is almost 1 year out from her surgery.  She is trying to maintain her weight.  She is training for a half marathon.  Patient is having numbness and tingling and a lot of fatigue.     Relevant past medical, surgical, family and social history reviewed and updated as indicated. Interim medical history since our last visit reviewed. Allergies and medications reviewed and updated.  Review of Systems  Constitutional:  Positive for fatigue.  Neurological:  Positive for numbness.  Psychiatric/Behavioral:  Positive for dysphoric mood. Negative for suicidal ideas. The patient is nervous/anxious.     Per HPI unless specifically indicated above     Objective:    LMP 06/01/2015 (Within Days)   Wt Readings from Last 3 Encounters:  06/03/24 153 lb 6.4 oz (69.6 kg)  04/29/24 148 lb  9.6 oz (67.4 kg)  03/30/24 148 lb 14.4 oz (67.5 kg)    Physical Exam Vitals and nursing note reviewed.  Constitutional:      General: She is not in acute distress.    Appearance: Normal appearance. She is normal weight. She is not ill-appearing, toxic-appearing or diaphoretic.  HENT:     Head: Normocephalic.     Right Ear: External ear normal.     Left Ear: External ear normal.     Nose: Nose normal.     Mouth/Throat:     Mouth: Mucous membranes are moist.     Pharynx: Oropharynx is clear.  Eyes:     General:        Right eye: No discharge.        Left eye: No discharge.     Extraocular Movements: Extraocular movements intact.     Conjunctiva/sclera: Conjunctivae normal.     Pupils: Pupils are equal, round, and reactive to light.  Cardiovascular:     Rate and Rhythm: Normal rate and regular rhythm.     Heart sounds: No murmur heard. Pulmonary:     Effort: Pulmonary effort is normal. No respiratory distress.     Breath sounds: Normal  breath sounds. No wheezing or rales.  Musculoskeletal:     Cervical back: Normal range of motion and neck supple.  Skin:    General: Skin is warm and dry.     Capillary Refill: Capillary refill takes less than 2 seconds.  Neurological:     General: No focal deficit present.     Mental Status: She is alert and oriented to person, place, and time. Mental status is at baseline.  Psychiatric:        Mood and Affect: Mood normal.        Behavior: Behavior normal.        Thought Content: Thought content normal.        Judgment: Judgment normal.     Results for orders placed or performed in visit on 12/31/23  Comprehensive metabolic panel with GFR   Collection Time: 12/31/23  8:30 AM  Result Value Ref Range   Glucose 76 70 - 99 mg/dL   BUN 15 6 - 24 mg/dL   Creatinine, Ser 9.15 0.57 - 1.00 mg/dL   eGFR 88 >40 fO/fpw/8.26   BUN/Creatinine Ratio 18 9 - 23   Sodium 140 134 - 144 mmol/L   Potassium 5.0 3.5 - 5.2 mmol/L   Chloride 101 96 - 106  mmol/L   CO2 26 20 - 29 mmol/L   Calcium 10.1 8.7 - 10.2 mg/dL   Total Protein 7.1 6.0 - 8.5 g/dL   Albumin 4.7 3.9 - 4.9 g/dL   Globulin, Total 2.4 1.5 - 4.5 g/dL   Bilirubin Total 1.1 0.0 - 1.2 mg/dL   Alkaline Phosphatase 80 44 - 121 IU/L   AST 16 0 - 40 IU/L   ALT 22 0 - 32 IU/L  Lipid panel   Collection Time: 12/31/23  8:30 AM  Result Value Ref Range   Cholesterol, Total 201 (H) 100 - 199 mg/dL   Triglycerides 56 0 - 149 mg/dL   HDL 79 >60 mg/dL   VLDL Cholesterol Cal 10 5 - 40 mg/dL   LDL Chol Calc (NIH) 887 (H) 0 - 99 mg/dL   Chol/HDL Ratio 2.5 0.0 - 4.4 ratio  B12   Collection Time: 12/31/23  8:30 AM  Result Value Ref Range   Vitamin B-12 462 232 - 1,245 pg/mL  VITAMIN D  25 Hydroxy (Vit-D Deficiency, Fractures)   Collection Time: 12/31/23  8:30 AM  Result Value Ref Range   Vit D, 25-Hydroxy 66.9 30.0 - 100.0 ng/mL      Assessment & Plan:   Problem List Items Addressed This Visit       Cardiovascular and Mediastinum   Pulmonary artery hypertension (HCC)     Respiratory   Interstitial pulmonary disease (HCC)     Other   Bipolar disorder (HCC) - Primary      Follow up plan: No follow-ups on file.

## 2024-07-07 NOTE — Patient Instructions (Signed)
 Please call to schedule your mammogram and/or bone density: [facility] at [facility]  Address: [address], [place], [place] [address] Phone: [phone]  [facility] [address] [place],  [place]  [address] Phone: [phone]

## 2024-07-07 NOTE — Assessment & Plan Note (Signed)
 Chronic.  About 2 years out from surgery.  Goal is to stay between 145-155lbs. Training for a half marathon currently.

## 2024-07-07 NOTE — Assessment & Plan Note (Signed)
 Chronic.  Controlled.  Continue with current medication regimen of Wellbutrin  daily.  Refills sent today.  Labs ordered today.  Return to clinic in 6 months for reevaluation.  Call sooner if concerns arise.

## 2024-07-08 ENCOUNTER — Ambulatory Visit: Payer: Self-pay | Admitting: Nurse Practitioner

## 2024-07-08 LAB — COMPREHENSIVE METABOLIC PANEL WITH GFR
ALT: 12 IU/L (ref 0–32)
AST: 17 IU/L (ref 0–40)
Albumin: 4.8 g/dL (ref 3.9–4.9)
Alkaline Phosphatase: 82 IU/L (ref 41–116)
BUN/Creatinine Ratio: 15 (ref 9–23)
BUN: 13 mg/dL (ref 6–24)
Bilirubin Total: 1.1 mg/dL (ref 0.0–1.2)
CO2: 28 mmol/L (ref 20–29)
Calcium: 10.1 mg/dL (ref 8.7–10.2)
Chloride: 101 mmol/L (ref 96–106)
Creatinine, Ser: 0.89 mg/dL (ref 0.57–1.00)
Globulin, Total: 2.3 g/dL (ref 1.5–4.5)
Glucose: 83 mg/dL (ref 70–99)
Potassium: 5 mmol/L (ref 3.5–5.2)
Sodium: 141 mmol/L (ref 134–144)
Total Protein: 7.1 g/dL (ref 6.0–8.5)
eGFR: 82 mL/min/1.73 (ref >59)

## 2024-07-08 LAB — CBC WITH DIFFERENTIAL/PLATELET
Basophils Absolute: 0 x10E3/uL (ref 0.0–0.2)
Basos: 1 %
EOS (ABSOLUTE): 0.1 x10E3/uL (ref 0.0–0.4)
Eos: 1 %
Hematocrit: 43.1 % (ref 34.0–46.6)
Hemoglobin: 14 g/dL (ref 11.1–15.9)
Immature Grans (Abs): 0 x10E3/uL (ref 0.0–0.1)
Immature Granulocytes: 0 %
Lymphocytes Absolute: 2.9 x10E3/uL (ref 0.7–3.1)
Lymphs: 50 %
MCH: 31.3 pg (ref 26.6–33.0)
MCHC: 32.5 g/dL (ref 31.5–35.7)
MCV: 96 fL (ref 79–97)
Monocytes Absolute: 0.5 x10E3/uL (ref 0.1–0.9)
Monocytes: 9 %
Neutrophils Absolute: 2.2 x10E3/uL (ref 1.4–7.0)
Neutrophils: 39 %
Platelets: 213 x10E3/uL (ref 150–450)
RBC: 4.47 x10E6/uL (ref 3.77–5.28)
RDW: 12.6 % (ref 11.7–15.4)
WBC: 5.7 x10E3/uL (ref 3.4–10.8)

## 2024-07-08 LAB — LIPID PANEL
Chol/HDL Ratio: 2.8 ratio (ref 0.0–4.4)
Cholesterol, Total: 206 mg/dL — ABNORMAL HIGH (ref 100–199)
HDL: 73 mg/dL (ref >39)
LDL Chol Calc (NIH): 119 mg/dL — ABNORMAL HIGH (ref 0–99)
Triglycerides: 79 mg/dL (ref 0–149)
VLDL Cholesterol Cal: 14 mg/dL (ref 5–40)

## 2024-07-08 LAB — TSH: TSH: 1.98 u[IU]/mL (ref 0.450–4.500)

## 2024-07-08 LAB — VITAMIN B12: Vitamin B-12: 847 pg/mL (ref 232–1245)

## 2024-07-08 LAB — VITAMIN D 25 HYDROXY (VIT D DEFICIENCY, FRACTURES): Vit D, 25-Hydroxy: 59.2 ng/mL (ref 30.0–100.0)

## 2024-07-30 ENCOUNTER — Ambulatory Visit (INDEPENDENT_AMBULATORY_CARE_PROVIDER_SITE_OTHER)

## 2024-07-30 ENCOUNTER — Ambulatory Visit: Admitting: Dermatology

## 2024-07-30 DIAGNOSIS — J309 Allergic rhinitis, unspecified: Secondary | ICD-10-CM | POA: Diagnosis not present

## 2024-07-30 DIAGNOSIS — Z86018 Personal history of other benign neoplasm: Secondary | ICD-10-CM

## 2024-07-30 DIAGNOSIS — D489 Neoplasm of uncertain behavior, unspecified: Secondary | ICD-10-CM

## 2024-07-30 DIAGNOSIS — D225 Melanocytic nevi of trunk: Secondary | ICD-10-CM | POA: Diagnosis not present

## 2024-07-30 NOTE — Progress Notes (Signed)
 Follow-Up Visit   Subjective  Emily [name] is a 43 y.o. female who presents for the following: Biopsies that were postponed at last visit due to marathon.   The following portions of the chart were reviewed this encounter and updated as appropriate: medications, allergies, medical history  Review of Systems:  No other skin or systemic complaints except as noted in HPI or Assessment and Plan.  Objective  Well appearing patient in no apparent distress; mood and affect are within normal limits.  A focused examination was performed of the following areas: Left upper back paraspinal Left lower back lateral to spine Right middle buttocks  Relevant exam findings are noted in the Assessment and Plan.  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  Call if any new or changing lesions are noted between office visits  NEOPLASM OF UNCERTAIN BEHAVIOR (3) Left Upper Back Paraspinal Epidermal / dermal shaving  Lesion diameter (cm):  0.6 Informed consent: discussed and consent obtained   Timeout: patient name, date of birth, surgical site, and procedure verified   Procedure prep:  Patient was prepped and draped in usual sterile fashion Prep type:  Isopropyl alcohol Anesthesia: the lesion was anesthetized in a standard fashion   Anesthetic:  1% lidocaine  w/ epinephrine  1-100,000 buffered w/ 8.4% NaHCO3 Instrument used: flexible razor blade   Hemostasis achieved with: pressure, aluminum  chloride and electrodesiccation   Outcome: patient tolerated procedure well   Post-procedure details: sterile dressing applied and wound care instructions given   Dressing type: bandage (Mupirocin 2% ointment)    Specimen 1 - Surgical pathology Differential Diagnosis: Nevus r/o dysplasia  Check Margins: Yes Left Lower Back Lateral to Spine Epidermal / dermal shaving  Lesion diameter (cm):  0.6 Informed consent: discussed and consent obtained   Timeout: patient name, date of birth, surgical site, and procedure verified   Procedure prep:  Patient was prepped and draped in usual sterile fashion Prep type:  Isopropyl alcohol Anesthesia: the lesion was anesthetized in a standard fashion   Anesthetic:  1% lidocaine  w/ epinephrine  1-100,000 buffered w/ 8.4% NaHCO3 Instrument used: flexible razor blade   Hemostasis achieved with: pressure, aluminum chloride and electrodesiccation   Outcome: patient tolerated procedure well   Post-procedure details: sterile dressing applied and wound care instructions given   Dressing type: bandage (Mupirocin 2% ointment)    Specimen 2 - Surgical pathology Differential Diagnosis: Nevus r/o dysplasia  Check Margins: Yes Right Middle Buttock Epidermal / dermal shaving  Lesion diameter (cm):  0.6 Informed consent: discussed and consent obtained   Timeout: patient name, date of birth, surgical site, and procedure verified   Procedure prep:  Patient was prepped and draped in usual sterile fashion Prep type:  Isopropyl alcohol Anesthesia: the lesion was anesthetized in a standard fashion   Anesthetic:  1% lidocaine  w/ epinephrine  1-100,000 buffered w/ 8.4% NaHCO3 Instrument used: flexible razor blade   Hemostasis achieved with: pressure, aluminum chloride and electrodesiccation   Outcome: patient tolerated procedure well   Post-procedure details:  sterile dressing applied and wound care instructions given   Dressing type: bandage (Mupirocin 2% ointment)     Specimen 3 - Surgical pathology Differential Diagnosis: Nevus r/o dysplasia   Check Margins: Yes  No follow-ups on file.  I, Emerick Ege, CMA am acting as scribe for Alm Rhyme, MD.   Documentation: I have reviewed the above documentation for accuracy and completeness, and I agree with the above.  Alm Rhyme, MD

## 2024-07-30 NOTE — Patient Instructions (Signed)

## 2024-08-03 LAB — SURGICAL PATHOLOGY

## 2024-08-04 ENCOUNTER — Encounter: Payer: Self-pay | Admitting: Dermatology

## 2024-08-17 ENCOUNTER — Ambulatory Visit: Payer: Self-pay | Admitting: Dermatology

## 2024-08-17 ENCOUNTER — Encounter: Payer: Self-pay | Admitting: Dermatology

## 2024-08-17 NOTE — Telephone Encounter (Addendum)
 Called and discussed bx results with patient. She verbalized understanding and denied further questions. Will recheck all areas at next follow up.  ----- Message from Alm Rhyme sent at 08/17/2024  3:44 PM EST ----- FINAL DIAGNOSIS        1. Skin, left upper back paraspinal :       RECURRENT DYSPLASTIC NEVUS, DEEP MARGIN INVOLVED        2. Skin, left lower back lateral to spine :       DYSPLASTIC COMPOUND NEVUS WITH MODERATE ATYPIA WITH SCAR AND PERSISTENT       NEVUS-LIKE CHANGES,  DEEP MARGIN INVOLVED, SEE DESCRIPTION        3. Skin, right middle buttock :       DYSPLASTIC COMPOUND NEVUS WITH MILD ATYPIA, DEEP MARGIN INVOLVED   1- Recurrent dysplastic Previously MODERATE dysplastic Recheck next visit 2- Moderate dysplastic Recheck next visit 3- Mild dysplastic Recheck next visit    ----- Message ----- From: Interface, [facility] Sent: 08/03/2024   5:05 PM EST To: Alm JAYSON Rhyme, MD

## 2024-09-03 ENCOUNTER — Ambulatory Visit (INDEPENDENT_AMBULATORY_CARE_PROVIDER_SITE_OTHER)

## 2024-09-03 DIAGNOSIS — J309 Allergic rhinitis, unspecified: Secondary | ICD-10-CM | POA: Diagnosis not present

## 2024-10-05 ENCOUNTER — Ambulatory Visit

## 2024-10-05 ENCOUNTER — Other Ambulatory Visit: Payer: Self-pay

## 2024-10-05 ENCOUNTER — Ambulatory Visit: Admitting: Internal Medicine

## 2024-10-05 ENCOUNTER — Encounter: Payer: Self-pay | Admitting: Internal Medicine

## 2024-10-05 VITALS — BP 104/80 | HR 78 | Temp 98.3°F | Ht 67.0 in | Wt 163.8 lb

## 2024-10-05 DIAGNOSIS — T7819XD Other adverse food reactions, not elsewhere classified, subsequent encounter: Secondary | ICD-10-CM

## 2024-10-05 DIAGNOSIS — J302 Other seasonal allergic rhinitis: Secondary | ICD-10-CM

## 2024-10-05 DIAGNOSIS — T7819XA Other adverse food reactions, not elsewhere classified, initial encounter: Secondary | ICD-10-CM

## 2024-10-05 DIAGNOSIS — L2089 Other atopic dermatitis: Secondary | ICD-10-CM

## 2024-10-05 DIAGNOSIS — J3089 Other allergic rhinitis: Secondary | ICD-10-CM | POA: Diagnosis not present

## 2024-10-05 DIAGNOSIS — T7800XA Anaphylactic reaction due to unspecified food, initial encounter: Secondary | ICD-10-CM

## 2024-10-05 DIAGNOSIS — J4520 Mild intermittent asthma, uncomplicated: Secondary | ICD-10-CM | POA: Diagnosis not present

## 2024-10-05 MED ORDER — BUDESONIDE 0.5 MG/2ML IN SUSP: RESPIRATORY_TRACT | 1 refills | Status: AC

## 2024-10-05 MED ORDER — ALBUTEROL SULFATE HFA 108 (90 BASE) MCG/ACT IN AERS: 1.0000 | INHALATION_SPRAY | RESPIRATORY_TRACT | 1 refills | Status: AC | PRN

## 2024-10-05 MED ORDER — TRIAMCINOLONE ACETONIDE 0.1 % EX OINT: TOPICAL_OINTMENT | CUTANEOUS | 1 refills | Status: AC

## 2024-10-05 MED ORDER — ALBUTEROL SULFATE (2.5 MG/3ML) 0.083% IN NEBU: 2.5000 mg | INHALATION_SOLUTION | RESPIRATORY_TRACT | 1 refills | Status: AC | PRN

## 2024-10-05 MED ORDER — EPINEPHRINE 0.3 MG/0.3ML IJ SOAJ: 0.3000 mg | INTRAMUSCULAR | 2 refills | Status: AC | PRN

## 2024-10-05 NOTE — Patient Instructions (Addendum)
 Asthma-mild intermittent Continue albuterol  once every 4 hours as needed for cough or wheeze You may use albuterol  2 puffs 5-15 minutes before activity to decrease cough or wheeze For asthma flare, begin Pulmicort  0.5 mg via nebulizer twice a day to prevent cough or wheeze. You may stop in 1-2 weeks or until cough and wheeze free.   Allergic rhinitis Continue allergen avoidance measures directed toward pollen, mold, pet, and dust mite as listed below Continue Flonase  nasal spray 2 sprays in each nostril once a day as needed for stuffy nose Continue ipratropium nasal spray 2 sprays in each nostril twice a day as needed for nasal symptoms Consider saline nasal rinses as needed for nasal symptoms. Use this before any medicated nasal sprays for best result Continue allergen immunotherapy per protocol and have access to an epinephrine  autoinjector set  Pruritus-resolved You can begin an antihistamine once a day as needed for itch. Some examples of over the counter antihistamines include Zyrtec (cetirizine), Xyzal (levocetirizine), Allegra (fexofenadine), and Claritin (loratidine).   Atopic dermatitis-at goal Continue a daily moisturizer twice a day For red and itchy areas below your face, begin triamcinolone  0.1% ointment up to twice a day as needed. Do not use this medication longer than 2 weeks in a row  Food allergy  Continue to avoid lamb, beef, pork, dairy products, artificial sweeteners, and agave.  In case of an allergic reaction, take Benadryl  50 mg every 4 hours, and if life-threatening symptoms occur, inject with EpiPen  0.3 mg. If your symptoms re-occur, begin a journal of events that occurred for up to 6 hours before your symptoms began including symptoms you experienced,foods and beverages consumed, and medications taken. - update alpha-gal testing via bloodwork; will call once results available  Call the clinic if this treatment plan is not working well for you.  Follow up in 6  months or sooner if needed.  Reducing Pollen Exposure The American Academy of All[employer] , Asthma and Immun[employer] suggests the following steps to reduce your exposure to pollen during allergy  seasons. Do not hang sheets or clothing out to dry; pollen may collect on these items. Do not mow lawns or spend time around freshly cut grass; mowing stirs up pollen. Keep windows closed at night.  Keep car windows closed while driving. Minimize morning activities outdoors, a time when pollen counts are usually at their highest. Stay indoors as much as possible when pollen counts or humidity is high and on windy days when pollen tends to remain in the air longer. Use air conditioning when possible.  Many air conditioners have filters that trap the pollen spores. Use a HEPA room air filter to remove pollen form the indoor air you breathe.  Control of Dog or Cat Allergen Avoidance is the best way to manage a dog or cat allergy . If you have a dog or cat and are allergic to dog or cats, consider removing the dog or cat from the home. If you have a dog or cat but dont want to find it a new home, or if your family wants a pet even though someone in the household is allergic, here are some strategies that may help keep symptoms at bay:  Keep the pet out of your bedroom and restrict it to only a few rooms. Be advised that keeping the dog or cat in only one room will not limit the allergens to that room. Dont pet, hug or kiss the dog or cat; if you do, wash your hands with soap and water. High-efficiency  particulate air (HEPA) cleaners run continuously in a bedroom or living room can reduce allergen levels over time. Regular use of a high-efficiency vacuum cleaner or a central vacuum can reduce allergen levels. Giving your dog or cat a bath at least once a week can reduce airborne allergen.   Control of Dust Mite Allergen Dust mites play a major role in allergic asthma and rhinitis. They occur in environments with  high humidity wherever human skin is found. Dust mites absorb humidity from the atmosphere (ie, they do not drink) and feed on organic matter (including shed human and animal skin). Dust mites are a microscopic type of insect that you cannot see with the naked eye. High levels of dust mites have been detected from mattresses, pillows, carpets, upholstered furniture, bed covers, clothes, soft toys and any woven material. The principal allergen of the dust mite is found in its feces. A gram of dust may contain 1,000 mites and 250,000 fecal particles. Mite antigen is easily measured in the air during house cleaning activities. Dust mites do not bite and do not cause harm to humans, other than by triggering allergies/asthma.  Ways to decrease your exposure to dust mites in your home:  1. Encase mattresses, box springs and pillows with a mite-impermeable barrier or cover  2. Wash sheets, blankets and drapes weekly in hot water (130 F) with detergent and dry them in a dryer on the hot setting.  3. Have the room cleaned frequently with a vacuum cleaner and a damp dust-mop. For carpeting or rugs, vacuuming with a vacuum cleaner equipped with a high-efficiency particulate air (HEPA) filter. The dust mite allergic individual should not be in a room which is being cleaned and should wait 1 hour after cleaning before going into the room.  4. Do not sleep on upholstered furniture (eg, couches).  5. If possible removing carpeting, upholstered furniture and drapery from the home is ideal. Horizontal blinds should be eliminated in the rooms where the person spends the most time (bedroom, study, television room). Washable vinyl, roller-type shades are optimal.  6. Remove all non-washable stuffed toys from the bedroom. Wash stuffed toys weekly like sheets and blankets above.  7. Reduce indoor humidity to less than 50%. Inexpensive humidity monitors can be purchased at most hardware stores. Do not use a humidifier as  can make the problem worse and are not recommended.  Control of Mold Allergen Mold and fungi can grow on a variety of surfaces provided certain temperature and moisture conditions exist.  Outdoor molds grow on plants, decaying vegetation and soil.  The major outdoor mold, Alternaria and Cladosporium, are found in very high numbers during hot and dry conditions.  Generally, a late Summer - Fall peak is seen for common outdoor fungal spores.  Rain will temporarily lower outdoor mold spore count, but counts rise rapidly when the rainy period ends.  The most important indoor molds are Aspergillus and Penicillium.  Dark, humid and poorly ventilated basements are ideal sites for mold growth.  The next most common sites of mold growth are the bathroom and the kitchen.  Outdoor [employer] Use air conditioning and keep windows closed Avoid exposure to decaying vegetation. Avoid leaf raking. Avoid grain handling. Consider wearing a face mask if working in moldy areas.  Indoor Mold Control Maintain humidity below 50%. Clean washable surfaces with 5% bleach solution. Remove sources e.g. Contaminated carpets.

## 2024-10-05 NOTE — Progress Notes (Signed)
 "  FOLLOW UP Date of Service/Encounter:   10/05/2024  Subjective:  Emily [name] (DOB: [birth year]) is a 44 y.o. female [facility] in re-evaluation of the following: allergic rhinitis on AIT, intermittent asthma, alpha-gal, atopic dermatitis History obtained from: chart review and patient.  For Review, LV was on 03/30/24  with Dr.Camil Hausmann seen for routine follow-up. See below for summary of history and diagnostics.  Therapeutic plans/changes recommended: doing well on AIT ----------------------------------------------------- Pertinent History/Diagnostics:  Asthma: Initial visit 04/09/22: intially seen for chronic cough and SOB, worse after having COVID twice, following with pulmonary who sent for concern for UACS.  Responds to albuterol  and has hx of exercise induced asthma, but new symptoms worsening since Covid. Unable to tolerate ICS due to irritation of throat and weight gain.   S/p Bariatric surgery 07/2022.  - normal cardiac eval. Last echo 08/04/21  - normal spirometry (04/04/23): 91% FEV1 Allergic Rhinitis:  Uses flonase , can not tolerate AH due to perceived weight gain.  - SPT environmental panel (04/09/22): negative on skin, + intradermals to [place], major and minor indoor and outdoor molds, cat, dog, and dust mites  - AIT started 04/09/22: receives 2 vials: (vial 1: C/D/DM/G and vial 2: molds), reached full maintenance on 01/30/23 Food Allergy :  agave makes her vomit Seafood-both fish and shellfish, makes her stomach upset and feels lethargic or blah, no vomiting-occurs within 30 minutes of ingestion. She can eat, but smell makes her nauseous when being cooked. Lamb-upset stomach, nausea, within 15-20 minutes of eating Artificial sweeteners in general make her sick to her stomach. - SPT select foods (04/09/22): negative to top 8 most common allergens, fish, shellfish and lamb -sIgE labs 10/04/2022 positive to milk, pork, lamb,  and beef  - alpha gal 04/26/23: positive 1.44-avoiding artificial sweeteners and alpha-gal products - OV 03/30/24: reported 2 accidental exposures to mammalian meat and artificial sweeteners resulting in abdominal pain and nausea without severe symptoms Eczema: Occasional rash when exposed to pet danders.   Has triamcinolone  to use PRN Other: OSA with CPAP but unable to use due to coughing fits. S/P bariatric surgery 2023. --------------------------------------------------- Today presents for follow-up. Discussed the use of AI scribe software for clinical note transcription with the patient, who gave verbal consent to proceed.  History of Present Illness Emily [name] is a 44 year old female with asthma and allergies who presents for follow-up on her asthma management and allergy  shots.  Asthma symptoms and management - Intermittently symptomatic with more good days than bad - Sudden episodes of shortness of breath requiring rescue inhaler, occurring infrequently since initiation of allergy  immunotherapy - Rescue inhaler (albuterol ) used once or twice per month - No recent need for oral corticosteroids - Nebulizer available at home for significant exacerbations, but Pulmicort  has not been used recently  Allergy  immunotherapy and local reactions - Receives allergy  shots monthly - Occasional significant local reactions at injection site, resolving with topical cream application  Cutaneous symptoms - Eczema generally not present but has mild flare on left arm today  Nasal symptoms and medication effects - Uses nasal sprays as needed, especially during seasonal changes - Nasal sprays associated with weight changes  Food and additive sensitivities - Accidental exposures to mammalian meat and artificial sweeteners cause mild symptoms such as stomach aches - Interested in retesting allergies to assess for changes in sensitivity   All medications reviewed by clinical staff and updated in  chart. No new pertinent medical or  surgical history except as noted in HPI.  ROS: All others negative except as noted per HPI.   Objective:  BP 104/80 (BP Location: Right Arm, Patient Position: Sitting, Cuff Size: Normal)   Pulse 78   Temp 98.3 F (36.8 C) (Temporal)   Ht 5' 7 (1.702 m)   Wt 163 lb 12.8 oz (74.3 kg)   LMP 06/01/2015   SpO2 99%   BMI 25.65 kg/m  Body mass index is 25.65 kg/m. Physical Exam: General Appearance:  Alert, cooperative, no distress, appears stated age  Head:  Normocephalic, without obvious abnormality, atraumatic  Eyes:  Conjunctiva clear, EOM's intact  Ears EACs normal bilaterally and normal TMs bilaterally  Nose: Nares normal, normal mucosa and no visible anterior polyps  Throat: Lips, tongue normal; teeth and gums normal, normal posterior oropharynx  Neck: Supple, symmetrical  Lungs:   clear to auscultation bilaterally, Respirations unlabored, no coughing  Heart:  regular rate and rhythm and no murmur, Appears well perfused  Extremities: No edema  Skin: erythematous, dry patches scattered on left forearm and Skin color, texture, turgor normal  Neurologic: No gross deficits   Labs:  Lab Orders         Alpha-Gal Panel      Spirometry:  Tracings reviewed. Her effort: Good reproducible efforts. FVC: 4.13L FEV1: 3.37L, 104% predicted FEV1/FVC ratio: 0.82 Interpretation: Spirometry consistent with normal pattern.  Please see scanned spirometry results for details.  Assessment/Plan   Asthma-mild intermittent-at goal Continue albuterol  once every 4 hours as needed for cough or wheeze You may use albuterol  2 puffs 5-15 minutes before activity to decrease cough or wheeze For asthma flare, begin Pulmicort  0.5 mg via nebulizer twice a day to prevent cough or wheeze. You may stop in 1-2 weeks or until cough and wheeze free.   Allergic rhinitis-at goal on AIT Continue allergen avoidance measures directed toward pollen, mold, pet, and dust mite as  listed below Continue Flonase  nasal spray 2 sprays in each nostril once a day as needed for stuffy nose Continue ipratropium nasal spray 2 sprays in each nostril twice a day as needed for nasal symptoms Consider saline nasal rinses as needed for nasal symptoms. Use this before any medicated nasal sprays for best result Continue allergen immunotherapy per protocol and have access to an epinephrine  autoinjector set  Pruritus-resolved You can begin an antihistamine once a day as needed for itch. Some examples of over the counter antihistamines include Zyrtec (cetirizine), Xyzal (levocetirizine), Allegra (fexofenadine), and Claritin (loratidine).   Atopic dermatitis-at goal Continue a daily moisturizer twice a day For red and itchy areas below your face, begin triamcinolone  0.1% ointment up to twice a day as needed. Do not use this medication longer than 2 weeks in a row  Food allergy  Continue to avoid lamb, beef, pork, dairy products, artificial sweeteners, and agave.  In case of an allergic reaction, take Benadryl  50 mg every 4 hours, and if life-threatening symptoms occur, inject with EpiPen  0.3 mg. If your symptoms re-occur, begin a journal of events that occurred for up to 6 hours before your symptoms began including symptoms you experienced,foods and beverages consumed, and medications taken. - update alpha-gal testing via bloodwork; will call once results available  Call the clinic if this treatment plan is not working well for you.  Follow up in 6 months or sooner if needed.  Other: none  Rocky Endow, MD  [facility]        "

## 2024-10-08 ENCOUNTER — Ambulatory Visit: Payer: Self-pay | Admitting: Internal Medicine

## 2024-10-08 LAB — ALPHA-GAL PANEL
Allergen Lamb IgE: 0.15 kU/L — AB
Beef IgE: 0.41 kU/L — AB
IgE (Immunoglobulin E), Serum: 31 [IU]/mL (ref 6–495)
O215-IgE Alpha-Gal: 0.74 kU/L — AB
Pork IgE: 0.17 kU/L — AB

## 2024-10-08 NOTE — Progress Notes (Signed)
 Please let Emily [name] know that her testing is still positive for alpha-gal but her numbers are slightly down-trending. I continue to recommend strict avoidance due to her history and recent exposures leading to mild symptoms.

## 2024-10-13 ENCOUNTER — Encounter: Payer: Self-pay | Admitting: Nurse Practitioner

## 2024-10-13 ENCOUNTER — Ambulatory Visit: Admitting: Nurse Practitioner

## 2024-10-13 VITALS — BP 105/68 | HR 74 | Temp 97.7°F | Ht 67.01 in | Wt 161.0 lb

## 2024-10-13 DIAGNOSIS — N941 Unspecified dyspareunia: Secondary | ICD-10-CM

## 2024-10-13 MED ORDER — ESTRADIOL 0.01 % VA CREA: 1.0000 | TOPICAL_CREAM | Freq: Every day | VAGINAL | 12 refills | Status: AC

## 2024-10-13 NOTE — Progress Notes (Signed)
 "  BP 105/68 (BP Location: Left Arm, Cuff Size: Normal)   Pulse 74   Temp 97.7 F (36.5 C) (Oral)   Ht 5' 7.01 (1.702 m)   Wt 161 lb (73 kg)   LMP 06/01/2015   SpO2 98%   BMI 25.21 kg/m    Subjective:    Patient ID: Emily [name] [place], female    DOB: 02-23-1981, 44 y.o.   MRN: [id number]  HPI: Emily [name] is a 44 y.o. female  Chief Complaint  Patient presents with   Vaginal Pain    Discomfort during sex. Patient stated it started 2 months ago.    Patient states she has been having pain with sex.  Started about two months ago.  Feels like she is tearing and having pain.  They are using lube but still having pain.    Relevant past medical, surgical, family and social history reviewed and updated as indicated. Interim medical history since our last visit reviewed. Allergies and medications reviewed and updated.  Review of Systems  Genitourinary:  Positive for dyspareunia.    Per HPI unless specifically indicated above     Objective:    BP 105/68 (BP Location: Left Arm, Cuff Size: Normal)   Pulse 74   Temp 97.7 F (36.5 C) (Oral)   Ht 5' 7.01 (1.702 m)   Wt 161 lb (73 kg)   LMP 06/01/2015   SpO2 98%   BMI 25.21 kg/m   Wt Readings from Last 3 Encounters:  10/13/24 161 lb (73 kg)  10/05/24 163 lb 12.8 oz (74.3 kg)  07/07/24 154 lb (69.9 kg)    Physical Exam Vitals and nursing note reviewed.  Constitutional:      General: She is not in acute distress.    Appearance: Normal appearance. She is normal weight. She is not ill-appearing, toxic-appearing or diaphoretic.  HENT:     Head: Normocephalic.     Right Ear: External ear normal.     Left Ear: External ear normal.     Nose: Nose normal.     Mouth/Throat:     Mouth: Mucous membranes are moist.     Pharynx: Oropharynx is clear.  Eyes:     General:        Right eye: No discharge.        Left eye: No discharge.     Extraocular Movements: Extraocular movements intact.     Conjunctiva/sclera: Conjunctivae  normal.     Pupils: Pupils are equal, round, and reactive to light.  Cardiovascular:     Rate and Rhythm: Normal rate and regular rhythm.     Heart sounds: No murmur heard. Pulmonary:     Effort: Pulmonary effort is normal. No respiratory distress.     Breath sounds: Normal breath sounds. No wheezing or rales.  Musculoskeletal:     Cervical back: Normal range of motion and neck supple.  Skin:    General: Skin is warm and dry.     Capillary Refill: Capillary refill takes less than 2 seconds.  Neurological:     General: No focal deficit present.     Mental Status: She is alert and oriented to person, place, and time. Mental status is at baseline.  Psychiatric:        Mood and Affect: Mood normal.        Behavior: Behavior normal.        Thought Content: Thought content normal.        Judgment: Judgment normal.  Results for orders placed or performed in visit on 10/05/24  Alpha-Gal Panel   Collection Time: 10/05/24 10:30 AM  Result Value Ref Range   Class Description Allergens Comment    IgE (Immunoglobulin E), Serum 31 6 - 495 IU/mL   Pork IgE 0.17 (A) Class 0/I kU/L   Beef IgE 0.41 (A) Class I kU/L   Allergen Lamb IgE 0.15 (A) Class 0/I kU/L   O215-IgE Alpha-Gal 0.74 (A) Class II kU/L      Assessment & Plan:   Problem List Items Addressed This Visit   None Visit Diagnoses       Dyspareunia, female    -  Primary   Likely related to vaginal dryness. Will start estradiol  cream nightly x 2 weeks then 2 x weekly for maintenance.  Follow up if not improved.        Follow up plan: No follow-ups on file.      "

## 2024-10-13 NOTE — Patient Instructions (Signed)
 Please call to schedule your mammogram and/or bone density: [facility] at [facility]  Address: [address], [place], [place] [address] Phone: [phone]  [facility] [address] [place],  [place]  [address] Phone: [phone]

## 2024-10-20 NOTE — Progress Notes (Signed)
 VIALS MADE ON 10/20/24

## 2024-10-21 ENCOUNTER — Ambulatory Visit: Admitting: Nurse Practitioner

## 2024-12-08 ENCOUNTER — Ambulatory Visit: Admitting: Skilled Nursing Facility1

## 2025-01-05 ENCOUNTER — Ambulatory Visit: Admitting: Nurse Practitioner

## 2025-04-05 ENCOUNTER — Ambulatory Visit: Admitting: Internal Medicine

## 2025-08-03 ENCOUNTER — Ambulatory Visit: Admitting: Dermatology
